# Patient Record
Sex: Female | Born: 1935 | Race: White | Hispanic: No
Health system: Southern US, Community
[De-identification: ages and names within clinical notes are randomized; demographics above are authoritative.]

## PROBLEM LIST (undated history)

## (undated) DIAGNOSIS — K219 Gastro-esophageal reflux disease without esophagitis: Secondary | ICD-10-CM

## (undated) DIAGNOSIS — R319 Hematuria, unspecified: Secondary | ICD-10-CM

## (undated) DIAGNOSIS — I447 Left bundle-branch block, unspecified: Secondary | ICD-10-CM

## (undated) DIAGNOSIS — M8580 Other specified disorders of bone density and structure, unspecified site: Secondary | ICD-10-CM

## (undated) DIAGNOSIS — E785 Hyperlipidemia, unspecified: Secondary | ICD-10-CM

## (undated) DIAGNOSIS — K429 Umbilical hernia without obstruction or gangrene: Secondary | ICD-10-CM

## (undated) DIAGNOSIS — I429 Cardiomyopathy, unspecified: Secondary | ICD-10-CM

## (undated) DIAGNOSIS — I5032 Chronic diastolic (congestive) heart failure: Secondary | ICD-10-CM

## (undated) DIAGNOSIS — I272 Pulmonary hypertension, unspecified: Secondary | ICD-10-CM

## (undated) DIAGNOSIS — M1990 Unspecified osteoarthritis, unspecified site: Secondary | ICD-10-CM

## (undated) DIAGNOSIS — I10 Essential (primary) hypertension: Secondary | ICD-10-CM

## (undated) DIAGNOSIS — I2510 Atherosclerotic heart disease of native coronary artery without angina pectoris: Secondary | ICD-10-CM

## (undated) DIAGNOSIS — Z950 Presence of cardiac pacemaker: Secondary | ICD-10-CM

## (undated) DIAGNOSIS — I340 Nonrheumatic mitral (valve) insufficiency: Secondary | ICD-10-CM

## (undated) DIAGNOSIS — C50919 Malignant neoplasm of unspecified site of unspecified female breast: Secondary | ICD-10-CM

## (undated) DIAGNOSIS — H3530 Unspecified macular degeneration: Secondary | ICD-10-CM

## (undated) DIAGNOSIS — E538 Deficiency of other specified B group vitamins: Secondary | ICD-10-CM

## (undated) HISTORY — DX: Other specified disorders of bone density and structure, unspecified site: M85.80

## (undated) HISTORY — PX: CATARACT EXTRACTION W/ INTRAOCULAR LENS  IMPLANT, BILATERAL: SHX1307

## (undated) HISTORY — PX: TONSILLECTOMY: SUR1361

## (undated) HISTORY — DX: Nonrheumatic mitral (valve) insufficiency: I34.0

## (undated) HISTORY — DX: Unspecified macular degeneration: H35.30

## (undated) HISTORY — DX: Atherosclerotic heart disease of native coronary artery without angina pectoris: I25.10

## (undated) HISTORY — DX: Pulmonary hypertension, unspecified: I27.20

## (undated) HISTORY — PX: HERNIA REPAIR: SHX51

## (undated) HISTORY — PX: JOINT REPLACEMENT: SHX530

## (undated) HISTORY — PX: CATARACT EXTRACTION: SUR2

## (undated) HISTORY — DX: Chronic diastolic (congestive) heart failure: I50.32

## (undated) HISTORY — DX: Unspecified osteoarthritis, unspecified site: M19.90

## (undated) HISTORY — DX: Hyperlipidemia, unspecified: E78.5

## (undated) HISTORY — DX: Deficiency of other specified B group vitamins: E53.8

## (undated) HISTORY — DX: Malignant neoplasm of unspecified site of unspecified female breast: C50.919

---

## 1993-08-04 HISTORY — PX: BREAST BIOPSY: SHX20

## 1993-08-04 HISTORY — PX: MASTECTOMY: SHX3

## 1998-08-15 ENCOUNTER — Other Ambulatory Visit: Admission: RE | Admit: 1998-08-15 | Discharge: 1998-08-15 | Payer: Self-pay | Admitting: *Deleted

## 1999-09-04 ENCOUNTER — Other Ambulatory Visit: Admission: RE | Admit: 1999-09-04 | Discharge: 1999-09-04 | Payer: Self-pay | Admitting: *Deleted

## 2000-09-09 ENCOUNTER — Other Ambulatory Visit: Admission: RE | Admit: 2000-09-09 | Discharge: 2000-09-09 | Payer: Self-pay | Admitting: *Deleted

## 2001-09-15 ENCOUNTER — Other Ambulatory Visit: Admission: RE | Admit: 2001-09-15 | Discharge: 2001-09-15 | Payer: Self-pay | Admitting: *Deleted

## 2002-09-19 ENCOUNTER — Encounter: Payer: Self-pay | Admitting: Family Medicine

## 2002-09-19 ENCOUNTER — Encounter: Admission: RE | Admit: 2002-09-19 | Discharge: 2002-09-19 | Payer: Self-pay | Admitting: Family Medicine

## 2002-11-15 ENCOUNTER — Other Ambulatory Visit: Admission: RE | Admit: 2002-11-15 | Discharge: 2002-11-15 | Payer: Self-pay | Admitting: *Deleted

## 2003-08-08 ENCOUNTER — Encounter: Admission: RE | Admit: 2003-08-08 | Discharge: 2003-08-08 | Payer: Self-pay | Admitting: Family Medicine

## 2003-08-08 ENCOUNTER — Encounter: Admission: RE | Admit: 2003-08-08 | Discharge: 2003-08-08 | Payer: Self-pay | Admitting: Sports Medicine

## 2003-08-11 ENCOUNTER — Encounter: Admission: RE | Admit: 2003-08-11 | Discharge: 2003-08-11 | Payer: Self-pay | Admitting: Sports Medicine

## 2003-08-25 ENCOUNTER — Encounter: Admission: RE | Admit: 2003-08-25 | Discharge: 2003-08-25 | Payer: Self-pay | Admitting: Family Medicine

## 2004-01-24 ENCOUNTER — Encounter: Admission: RE | Admit: 2004-01-24 | Discharge: 2004-01-24 | Payer: Self-pay | Admitting: *Deleted

## 2004-05-16 ENCOUNTER — Encounter: Admission: RE | Admit: 2004-05-16 | Discharge: 2004-05-16 | Payer: Self-pay | Admitting: Family Medicine

## 2004-07-01 ENCOUNTER — Encounter: Admission: RE | Admit: 2004-07-01 | Discharge: 2004-07-25 | Payer: Self-pay | Admitting: Family Medicine

## 2005-04-14 ENCOUNTER — Other Ambulatory Visit: Admission: RE | Admit: 2005-04-14 | Discharge: 2005-04-14 | Payer: Self-pay | Admitting: *Deleted

## 2005-07-23 ENCOUNTER — Ambulatory Visit (HOSPITAL_COMMUNITY): Admission: RE | Admit: 2005-07-23 | Discharge: 2005-07-23 | Payer: Self-pay | Admitting: Family Medicine

## 2005-07-23 ENCOUNTER — Encounter: Admission: RE | Admit: 2005-07-23 | Discharge: 2005-07-23 | Payer: Self-pay | Admitting: Family Medicine

## 2006-09-16 ENCOUNTER — Encounter: Admission: RE | Admit: 2006-09-16 | Discharge: 2006-09-16 | Payer: Self-pay | Admitting: Family Medicine

## 2007-08-24 ENCOUNTER — Encounter: Admission: RE | Admit: 2007-08-24 | Discharge: 2007-08-24 | Payer: Self-pay | Admitting: Family Medicine

## 2008-03-28 ENCOUNTER — Emergency Department (HOSPITAL_COMMUNITY): Admission: EM | Admit: 2008-03-28 | Discharge: 2008-03-28 | Payer: Self-pay | Admitting: Family Medicine

## 2008-08-04 HISTORY — PX: CORONARY ANGIOPLASTY WITH STENT PLACEMENT: SHX49

## 2008-10-03 ENCOUNTER — Encounter: Admission: RE | Admit: 2008-10-03 | Discharge: 2008-10-03 | Payer: Self-pay | Admitting: Family Medicine

## 2009-02-28 ENCOUNTER — Ambulatory Visit: Payer: Self-pay | Admitting: Sports Medicine

## 2009-02-28 DIAGNOSIS — M25569 Pain in unspecified knee: Secondary | ICD-10-CM | POA: Insufficient documentation

## 2009-02-28 DIAGNOSIS — M549 Dorsalgia, unspecified: Secondary | ICD-10-CM | POA: Insufficient documentation

## 2009-02-28 DIAGNOSIS — M216X9 Other acquired deformities of unspecified foot: Secondary | ICD-10-CM | POA: Insufficient documentation

## 2009-03-01 ENCOUNTER — Encounter: Payer: Self-pay | Admitting: Sports Medicine

## 2009-05-29 ENCOUNTER — Ambulatory Visit: Payer: Self-pay | Admitting: Sports Medicine

## 2009-08-04 HISTORY — PX: TOTAL HIP ARTHROPLASTY: SHX124

## 2009-08-08 ENCOUNTER — Encounter: Admission: RE | Admit: 2009-08-08 | Discharge: 2009-08-08 | Payer: Self-pay | Admitting: Family Medicine

## 2009-08-15 ENCOUNTER — Encounter: Admission: RE | Admit: 2009-08-15 | Discharge: 2009-08-15 | Payer: Self-pay | Admitting: Family Medicine

## 2009-12-04 ENCOUNTER — Encounter: Admission: RE | Admit: 2009-12-04 | Discharge: 2010-03-04 | Payer: Self-pay | Admitting: Ophthalmology

## 2010-01-03 ENCOUNTER — Inpatient Hospital Stay (HOSPITAL_BASED_OUTPATIENT_CLINIC_OR_DEPARTMENT_OTHER): Admission: RE | Admit: 2010-01-03 | Discharge: 2010-01-03 | Payer: Self-pay | Admitting: Cardiology

## 2010-01-03 ENCOUNTER — Inpatient Hospital Stay (HOSPITAL_COMMUNITY): Admission: EM | Admit: 2010-01-03 | Discharge: 2010-01-05 | Payer: Self-pay | Admitting: Cardiology

## 2010-01-17 ENCOUNTER — Encounter (HOSPITAL_COMMUNITY): Admission: RE | Admit: 2010-01-17 | Discharge: 2010-04-17 | Payer: Self-pay | Admitting: Cardiology

## 2010-04-02 ENCOUNTER — Encounter: Payer: Self-pay | Admitting: Sports Medicine

## 2010-04-18 ENCOUNTER — Encounter (HOSPITAL_COMMUNITY): Admission: RE | Admit: 2010-04-18 | Discharge: 2010-05-03 | Payer: Self-pay | Admitting: Cardiology

## 2010-04-18 ENCOUNTER — Encounter: Admission: RE | Admit: 2010-04-18 | Discharge: 2010-04-18 | Payer: Self-pay | Admitting: Sports Medicine

## 2010-04-18 ENCOUNTER — Ambulatory Visit: Payer: Self-pay | Admitting: Sports Medicine

## 2010-04-18 DIAGNOSIS — M1990 Unspecified osteoarthritis, unspecified site: Secondary | ICD-10-CM | POA: Insufficient documentation

## 2010-04-18 DIAGNOSIS — M25559 Pain in unspecified hip: Secondary | ICD-10-CM | POA: Insufficient documentation

## 2010-04-23 ENCOUNTER — Encounter: Payer: Self-pay | Admitting: Sports Medicine

## 2010-05-04 ENCOUNTER — Encounter (HOSPITAL_COMMUNITY)
Admission: RE | Admit: 2010-05-04 | Discharge: 2010-08-02 | Payer: Self-pay | Source: Home / Self Care | Attending: Cardiology | Admitting: Cardiology

## 2010-09-05 NOTE — Letter (Signed)
Summary: [facility]   Imported ByLevada Schilling 03/01/2009 09:04:45  _____________________________________________________________________  External Attachment:    Type:   Image     Comment:   External Document

## 2010-09-05 NOTE — Assessment & Plan Note (Signed)
Summary: R HIP PAIN AND R KNEE PAIN   Vital Signs:  Patient profile:   77 year old female BP sitting:   152 / 76  Vitals Entered By: Lillia Pauls CMA (May 29, 2009 2:52 PM)  History of Present Illness: CC: right knee and hip pain  [name] presents for f/u of right knee pain.  She has had knee pain in right knee for >26yr now, but states this was exacerbated in July after trip to beach where she had to go up and down stairs alot.  Also started noticing hip pain with these steps.  Seen at [facility] 02/2009 and found to have leg length discrepancy, placed in felt pad on right shoe, placed on donjoy brace to use daily and advised to f/u as needed.  Returns today with continued knee pain.  States using knee brace regularly.  Still with lateral knee pain but does note improvement with Motrin which she takes 600mg  in am, 400mg  in afternoon and 400mg  in evening.  No knee swelling/buckling/locking/catching. No prior knee procedures.  no h/o xrays.  also endorses 1wk hsitory (2 wks ago) of BLE feeling very weak and fatigued, resolved on its own. Also states last week she had sensation of imbalance but since fatigue resolved, this has improved as well.  Physical Exam  General:  Well-developed,well-nourished,in no acute distress; alert,appropriate and cooperative throughout examination Msk:  Hips: No ttp.  no SI joint tnederness, no greater trochanteric tenderness.    Knees: Genu Valgum (R>L). Generalized crepitus on the right. lateral jt line ttp on the right. No increased ligament laxity. (-) McMurray's. FROM with 5/5 strength. both knees show generalized synovial swelling but show good extension  Ankles/Feet: FROM of the ankles with 5/5 strength throughout. Significant bilateral MT arch collapse. Significant bilateral Hallux valgus. Bilateral Pes Planus.   right leg shortening evident from greater valgus at knee on right. Neurologic:  neurovascularly intact   Impression &  Recommendations:  Problem # 1:  KNEE PAIN, RIGHT, CHRONIC (ICD-719.46) continue DonJoy soft knee brace.  may continue to use motrin as needed.  continue felt inserts in her right shoes.  provided with 2 more felt inserts for other pairs of shoes at home.  Recommended 800 International Units vit d daily as well as 500mg  vit C for joint health.  If feels BLE weak again, to return for blood work (check K, Cr, consider checking CBC, vit D again, etc.  Problem # 2:  OTHER ACQUIRED DEFORMITY OF ANKLE AND FOOT OTHER (ICD-736.79) see #1.  Patient Instructions: 1)  Continue motrin as up to now. 2)  Continue knee brace use daily as able to. 3)  Start taking 800IU of vitamin D daily as well as 500mg  of Vit C.  4)  We have provided you with felt pads to place in your right shoes at home. 5)  Start practicing the balance exercises discussed today.  Be careful not to fall!

## 2010-09-05 NOTE — Medication Information (Signed)
Summary: [facility]   Imported By: Marily Memos 04/22/2010 10:18:37  _____________________________________________________________________  External Attachment:    Type:   Image     Comment:   External Document

## 2010-09-05 NOTE — Assessment & Plan Note (Signed)
Summary: R KNEE PAIN UP TO HIP   Vital Signs:  Patient profile:   77 year old female Height:      65 inches (165.10 cm) Weight:      185 pounds (84.09 kg) BMI:     30.90 BP sitting:   137 / 76  Vitals Entered By: Kathi Simpers [facility]) (April 18, 2010 11:11 AM)  History of Present Illness: Past 2 mos worse RT knee and hip pain has had knee pain for a long time now sxs are really limiting as they go to the hip as well  RT hip hurts back to buttocks w putting on RT shoe no groin pain  walking causes pain bike and step did not cause pain  easier to walk w walker  Allergies (verified): No Known Drug Allergies  Physical Exam  General:  Well-developed,well-nourished,in no acute distress; alert,appropriate and cooperative throughout examination Msk:  limited IR and ER of RT hip There is mild weakness in abduction and adduction also in flexion  left hip shows better IR and ER - about 45 to 50 deg total vs only 25 on RT  RT knee with chronic DJD changes prob mild effusion but not tight genu valgum is significant  standing shows funx short RT leg 2/2 valgus shift walks in pronation Additional Exam:  RT hip Xrays reveal sclerosis 100% loss of joint space spurring possible wedge of AVN  Rt Knee tricompartmental DJD spurring under patella lat joint space lost   Impression & Recommendations:  Problem # 1:  HIP PAIN, RIGHT (ICD-719.45)  Her updated medication list for this problem includes:    Tramadol Hcl 50 Mg Tabs (Tramadol hcl) .Marland Kitchen... 1 by mouth qid as needed pain   given RX for a cane with  severe DJD we need to help lessen pain as much as we can  Problem # 2:  KNEE PAIN, RIGHT, CHRONIC (ICD-719.46)  Her updated medication list for this problem includes:    Tramadol Hcl 50 Mg Tabs (Tramadol hcl) .Marland Kitchen... 1 by mouth qid as needed pain  use wedge in shoe use good support w knee sleeve  will keep up some of walking for cardiac rehab  will reck in 1  mo  Problem # 3:  DEGENERATIVE JOINT DISEASE (ICD-715.90)  Her updated medication list for this problem includes:    Tramadol Hcl 50 Mg Tabs (Tramadol hcl) .Marland Kitchen... 1 by mouth qid as needed pain  Orders: Radiology other (Radiology Other)   This is severe On RT hip replacement would be feasible this seems more severe at this point than knee  Problem # 4:  OTHER ACQUIRED DEFORMITY OF ANKLE AND FOOT OTHER (ICD-736.79) ankle/foot pronated will keep using some foot support and wedging  Complete Medication List: 1)  Tramadol Hcl 50 Mg Tabs (Tramadol hcl) .Marland Kitchen.. 1 by mouth qid as needed pain 2)  Fosamax 70 Mg Tabs (Alendronate sodium) .... Take 1 tablet by mouth once a week 3)  Plavix 75 Mg Tabs (Clopidogrel bisulfate) .... Take 1 tablet by mouth daily Prescriptions: TRAMADOL HCL 50 MG TABS (TRAMADOL HCL) 1 by mouth qid as needed pain  #100 x 3   Entered and Authorized by:   Enid Baas MD   Signed by:   Enid Baas MD on 04/18/2010   Method used:   Electronically to        [facility] [id number] (retail)       [address]       [place]  [place], [place]  [address]       Ph: [phone] or [phone]       Fax: [phone]   RxID:   [id number]

## 2010-09-05 NOTE — Letter (Signed)
Summary: *Referral Letter  [facility]  [address]   [place], [place] [address]   Phone: [phone]  Fax: [phone]    04/23/2010 Gean Birchwood, MD [facility] Fax [phone]   Dear Homero Fellers:   Thank you in advance for agreeing to see my patient:  Denae Zulueta [address] [place], [place]  [address]  Phone: [phone]  Reason for Referral: Severe DJD of RT hip  Procedures Requested: I think she needs THR but would like your opinion.  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  She is very functional but pain in hip is really limiting this.  Current Medical Problems: advanced DJD of RT hip and of knees bilaterally   Current Medications: 1)  TRAMADOL HCL 50 MG TABS (TRAMADOL HCL) 1 by mouth qid as needed pain 2)  FOSAMAX 70 MG TABS (ALENDRONATE SODIUM) Take 1 tablet by mouth once a week 3)  PLAVIX 75 MG TABS (CLOPIDOGREL BISULFATE) Take 1 tablet by mouth daily   Past Medical History: stent for coronary artery disease Dr. Jeanmarie Hubert follows her for primary care   Thank you again for agreeing to see our patient; please contact us if you have any further questions or need additional information.  Sincerely,  Vincent Gros MD

## 2010-09-11 ENCOUNTER — Encounter: Payer: Self-pay | Admitting: *Deleted

## 2010-09-24 ENCOUNTER — Encounter (HOSPITAL_COMMUNITY)
Admission: RE | Admit: 2010-09-24 | Discharge: 2010-09-24 | Disposition: A | Discharge status: Home / Self Care | Payer: Medicare Other | Source: Ambulatory Visit | Attending: Orthopedic Surgery | Admitting: Orthopedic Surgery

## 2010-09-24 ENCOUNTER — Other Ambulatory Visit (HOSPITAL_COMMUNITY): Payer: Self-pay | Admitting: Orthopedic Surgery

## 2010-09-24 DIAGNOSIS — Z01811 Encounter for preprocedural respiratory examination: Secondary | ICD-10-CM

## 2010-09-24 DIAGNOSIS — Z01818 Encounter for other preprocedural examination: Secondary | ICD-10-CM | POA: Insufficient documentation

## 2010-09-24 DIAGNOSIS — Z01812 Encounter for preprocedural laboratory examination: Secondary | ICD-10-CM | POA: Insufficient documentation

## 2010-09-24 LAB — BASIC METABOLIC PANEL
BUN: 16 mg/dL (ref 6–23)
CO2: 24 mEq/L (ref 19–32)
Calcium: 9.5 mg/dL (ref 8.4–10.5)
Chloride: 111 mEq/L (ref 96–112)
Creatinine, Ser: 0.81 mg/dL (ref 0.4–1.2)
GFR calc Af Amer: >60 mL/min (ref >60)
GFR calc non Af Amer: >60 mL/min (ref >60)
Glucose, Bld: 97 mg/dL (ref 70–99)
Potassium: 4.9 mEq/L (ref 3.5–5.1)
Sodium: 142 mEq/L (ref 135–145)

## 2010-09-24 LAB — CBC
HCT: 39.3 % (ref 36.0–46.0)
Hemoglobin: 12.4 g/dL (ref 12.0–15.0)
MCH: 30.1 pg (ref 26.0–34.0)
MCHC: 31.6 g/dL (ref 30.0–36.0)
MCV: 95.4 fL (ref 78.0–100.0)
Platelets: 285 10*3/uL (ref 150–400)
RBC: 4.12 MIL/uL (ref 3.87–5.11)
RDW: 14.7 % (ref 11.5–15.5)
WBC: 8.1 10*3/uL (ref 4.0–10.5)

## 2010-09-24 LAB — ABO/RH: ABO/RH(D): O NEG

## 2010-09-24 LAB — DIFFERENTIAL
Basophils Absolute: 0 10*3/uL (ref 0.0–0.1)
Basophils Relative: 0 % (ref 0–1)
Eosinophils Absolute: 0.1 10*3/uL (ref 0.0–0.7)
Eosinophils Relative: 2 % (ref 0–5)
Lymphocytes Relative: 25 % (ref 12–46)
Lymphs Abs: 2 10*3/uL (ref 0.7–4.0)
Monocytes Absolute: 0.5 10*3/uL (ref 0.1–1.0)
Monocytes Relative: 6 % (ref 3–12)
Neutro Abs: 5.4 10*3/uL (ref 1.7–7.7)
Neutrophils Relative %: 67 % (ref 43–77)

## 2010-09-24 LAB — TYPE AND SCREEN
ABO/RH(D): O NEG
Antibody Screen: NEGATIVE

## 2010-09-24 LAB — PROTIME-INR
INR: 0.95 (ref 0.00–1.49)
Prothrombin Time: 12.9 seconds (ref 11.6–15.2)

## 2010-09-24 LAB — SURGICAL PCR SCREEN
MRSA, PCR: NEGATIVE
Staphylococcus aureus: NEGATIVE

## 2010-09-24 LAB — APTT: aPTT: 25 seconds (ref 24–37)

## 2010-10-07 ENCOUNTER — Inpatient Hospital Stay (HOSPITAL_BASED_OUTPATIENT_CLINIC_OR_DEPARTMENT_OTHER)
Admission: RE | Admit: 2010-10-07 | Discharge: 2010-10-07 | Disposition: A | Discharge status: Home / Self Care | Payer: Medicare Other | Source: Ambulatory Visit | Attending: Cardiology | Admitting: Cardiology

## 2010-10-07 DIAGNOSIS — Y831 Surgical operation with implant of artificial internal device as the cause of abnormal reaction of the patient, or of later complication, without mention of misadventure at the time of the procedure: Secondary | ICD-10-CM | POA: Insufficient documentation

## 2010-10-07 DIAGNOSIS — I2510 Atherosclerotic heart disease of native coronary artery without angina pectoris: Secondary | ICD-10-CM | POA: Insufficient documentation

## 2010-10-07 DIAGNOSIS — I059 Rheumatic mitral valve disease, unspecified: Secondary | ICD-10-CM | POA: Insufficient documentation

## 2010-10-07 DIAGNOSIS — T82897A Other specified complication of cardiac prosthetic devices, implants and grafts, initial encounter: Secondary | ICD-10-CM | POA: Insufficient documentation

## 2010-10-10 ENCOUNTER — Observation Stay (HOSPITAL_COMMUNITY)
Admission: RE | Admit: 2010-10-10 | Discharge: 2010-10-11 | Disposition: A | Discharge status: Home / Self Care | Payer: Medicare Other | Source: Ambulatory Visit | Attending: Interventional Cardiology | Admitting: Interventional Cardiology

## 2010-10-10 DIAGNOSIS — E7800 Pure hypercholesterolemia, unspecified: Secondary | ICD-10-CM | POA: Insufficient documentation

## 2010-10-10 DIAGNOSIS — Y831 Surgical operation with implant of artificial internal device as the cause of abnormal reaction of the patient, or of later complication, without mention of misadventure at the time of the procedure: Secondary | ICD-10-CM | POA: Insufficient documentation

## 2010-10-10 DIAGNOSIS — Z853 Personal history of malignant neoplasm of breast: Secondary | ICD-10-CM | POA: Insufficient documentation

## 2010-10-10 DIAGNOSIS — I10 Essential (primary) hypertension: Secondary | ICD-10-CM | POA: Insufficient documentation

## 2010-10-10 DIAGNOSIS — T82897A Other specified complication of cardiac prosthetic devices, implants and grafts, initial encounter: Principal | ICD-10-CM | POA: Insufficient documentation

## 2010-10-10 DIAGNOSIS — H3530 Unspecified macular degeneration: Secondary | ICD-10-CM | POA: Insufficient documentation

## 2010-10-10 DIAGNOSIS — M129 Arthropathy, unspecified: Secondary | ICD-10-CM | POA: Insufficient documentation

## 2010-10-10 DIAGNOSIS — Z7902 Long term (current) use of antithrombotics/antiplatelets: Secondary | ICD-10-CM | POA: Insufficient documentation

## 2010-10-10 DIAGNOSIS — Z7982 Long term (current) use of aspirin: Secondary | ICD-10-CM | POA: Insufficient documentation

## 2010-10-10 DIAGNOSIS — I2510 Atherosclerotic heart disease of native coronary artery without angina pectoris: Secondary | ICD-10-CM | POA: Insufficient documentation

## 2010-10-10 DIAGNOSIS — I059 Rheumatic mitral valve disease, unspecified: Secondary | ICD-10-CM | POA: Insufficient documentation

## 2010-10-11 LAB — CBC
HCT: 33.3 % — ABNORMAL LOW (ref 36.0–46.0)
Hemoglobin: 10.7 g/dL — ABNORMAL LOW (ref 12.0–15.0)
MCH: 30.1 pg (ref 26.0–34.0)
MCHC: 32.1 g/dL (ref 30.0–36.0)
MCV: 93.8 fL (ref 78.0–100.0)
Platelets: 211 10*3/uL (ref 150–400)
RBC: 3.55 MIL/uL — ABNORMAL LOW (ref 3.87–5.11)
RDW: 14.4 % (ref 11.5–15.5)
WBC: 7.3 10*3/uL (ref 4.0–10.5)

## 2010-10-11 LAB — BASIC METABOLIC PANEL
BUN: 19 mg/dL (ref 6–23)
CO2: 27 mEq/L (ref 19–32)
Calcium: 8.6 mg/dL (ref 8.4–10.5)
Chloride: 108 mEq/L (ref 96–112)
Creatinine, Ser: 0.79 mg/dL (ref 0.4–1.2)
GFR calc Af Amer: >60 mL/min (ref >60)
GFR calc non Af Amer: >60 mL/min (ref >60)
Glucose, Bld: 96 mg/dL (ref 70–99)
Potassium: 4.2 mEq/L (ref 3.5–5.1)
Sodium: 141 mEq/L (ref 135–145)

## 2010-10-11 LAB — POCT ACTIVATED CLOTTING TIME: Activated Clotting Time: 387 seconds

## 2010-10-11 NOTE — Procedures (Signed)
[name], [name]                 ACCOUNT NO.:  [id number]  MEDICAL RECORD NO.:  [id number]          PATIENT TYPE:  LOCATION:                                 FACILITY:  PHYSICIAN:  Armanda Magic, M.D.     DATE OF BIRTH:  07-Oct-1922  DATE OF PROCEDURE:  10/07/2010 DATE OF DISCHARGE:                           CARDIAC CATHETERIZATION   REFERRING PHYSICIAN:  Bryan Lemma. Ehinger, MD  PROCEDURES:  Left heart catheterization, coronary angiography, left ventriculography.  OPERATOR:  Armanda Magic, MD  INDICATIONS:  Chest pain, abnormal nuclear stress test, abnormal 2-D echocardiogram with reduced LV function, and CAD.  COMPLICATIONS:  None.  IV ACCESS:  Via right femoral artery 4-French sheath.  IV MEDICATIONS:  Fentanyl 25 mcg.  This is an 77 year old female with history of CAD, status post drug- eluting stent to the LAD with residual diagonal disease in June 2011 also with known moderate mitral regurgitation and normal LV function by cath in June 2011 who presented with an episode of chest pain and the need for preoperative clearance for hip surgery.  A week prior to her presenting for preoperative clearance exam, she had an episode of sustained chest pain, classic for unstable angina.  She underwent nuclear stress test showing a mild perfusion defect in the basal inferior, mid inferior, apical inferior, basal anterior wall, which was felt most likely be due to attenuation artifact.  LV function on Cardiolite showed an EF of 46% and 2-D echocardiogram was done, which showed an EF of approximately 35%.  The patient now presents for cardiac catheterization.  The patient was brought to the cardiac catheterization laboratory in a fasting nonsedated state.  Informed consent was obtained.  The patient was connected to continuous heart rate and pulse oximetry monitoring and intermittent blood pressure monitoring.  The right groin was prepped and draped in sterile fashion.  Lidocaine  1% was used for local anesthesia. Using a modified Seldinger technique, a 4-French sheath was placed in right femoral artery.  Under fluoroscopic guidance, a 4-French JL-4 catheter was placed in the left coronary artery.  Multiple cine films were taken at 30-degree RAO and 40-degree LAO views.  This catheter was then exchanged out over a guidewire for a 4-French 3RCA catheter, which was placed under fluoroscopic guidance into the right coronary ostium. Multiple cine films were taken at 30-degree RAO and 40-degree LAO views. This catheter was then exchanged out over guidewire for a 4-French angled pigtail catheter, which was placed under fluoroscopic guidance in the left ventricular cavity.  Left ventriculography was performed in a 30-degree RAO view using a total of 25 mL of contrast at 12 mL per second.  The catheter was then pulled back across the aortic valve with no significant gradient noted.  At the end of procedure, all catheters and sheaths were removed.  Manual compression was performed until adequate hemostasis was obtained.  The patient was transferred back to room in stable condition.  Of note, the patient did have an episode of chest pain during coronary artery injection and was given one sublingual nitroglycerin.  RESULTS:  Left main coronary artery is widely  patent and bifurcates the left anterior descending artery and left circumflex artery.  Left anterior descending artery has a stent in the proximal portion with 50-60% in-stent restenosis.  The ongoing LAD gives rise to a moderate- sized diagonal in the midportion of the LAD.  This diagonal has an 80% stenosis in the proximal and midportion before bifurcating into daughter vessels, both of which are patent but small, just at the takeoff of this diagonal, the LAD has an 80% narrowing and the rest of the vessel tapers down to a very small luminal caliber towards the apex.  The left circumflex is widely patent throughout  its course in the AV groove.  It gives rise to a first obtuse marginal branch, which is moderate in size and is widely patent and bifurcates in two daughter vessels.  It then gives rise to a second smaller obtuse marginal branch which is patent.  The right coronary artery has a calcified plaque in the proximal portion with 30% eccentric lesion.  The rest of the RCA is widely patent throughout its course distally and bifurcates into posterior descending artery and posterior lateral artery, both of which are widely patent.  Left ventriculography shows mild-to-moderate LV dysfunction, EF 35-40%, LV pressure 152/60 mmHg, aortic pressure 152/62 mmHg, LVEDP 20 mmHg.  ASSESSMENT: 1. A 50-60% in-stent restenosis of proximal left anterior descending. 2. An 80% mid left anterior descending stenosis. 3. An 80% diagonal 1 stenosis. 4. Nonobstructive disease of the right coronary artery. 5. Mild-to-moderate left ventricular dysfunction.  PLAN:  We will discharge home after IV fluid and bedrest complete.  We will start Altace 2.5 mg daily, Coreg 3.125 mg b.i.d., discontinue Bumex and start Lasix 20 mg daily.  She will continue on her aspirin, Plavix, and Imdur.  I am going to review the films with Dr. Verdis Prime to see if PCI of the mid LAD is a possibility.  She will follow up with me in 2 weeks.     Armanda Magic, M.D.     TT/MEDQ  D:  10/07/2010  T:  10/08/2010  Job:  [id number]  cc:   Bryan Lemma. Manus Gunning, M.D.  Electronically Signed by Armanda Magic M.D. on 10/10/2010 04:20:03 PM

## 2010-10-16 ENCOUNTER — Encounter: Payer: Self-pay | Admitting: *Deleted

## 2010-10-21 LAB — CBC
HCT: 29.3 % — ABNORMAL LOW (ref 36.0–46.0)
HCT: 31.3 % — ABNORMAL LOW (ref 36.0–46.0)
HCT: 33 % — ABNORMAL LOW (ref 36.0–46.0)
Hemoglobin: 10 g/dL — ABNORMAL LOW (ref 12.0–15.0)
Hemoglobin: 10.6 g/dL — ABNORMAL LOW (ref 12.0–15.0)
Hemoglobin: 11 g/dL — ABNORMAL LOW (ref 12.0–15.0)
MCHC: 33.5 g/dL (ref 30.0–36.0)
MCHC: 34 g/dL (ref 30.0–36.0)
MCHC: 34.2 g/dL (ref 30.0–36.0)
MCV: 94.9 fL (ref 78.0–100.0)
MCV: 95.1 fL (ref 78.0–100.0)
MCV: 95.1 fL (ref 78.0–100.0)
Platelets: 209 10*3/uL (ref 150–400)
Platelets: 218 10*3/uL (ref 150–400)
Platelets: 229 10*3/uL (ref 150–400)
RBC: 3.08 MIL/uL — ABNORMAL LOW (ref 3.87–5.11)
RBC: 3.29 MIL/uL — ABNORMAL LOW (ref 3.87–5.11)
RBC: 3.47 MIL/uL — ABNORMAL LOW (ref 3.87–5.11)
RDW: 13.4 % (ref 11.5–15.5)
RDW: 13.6 % (ref 11.5–15.5)
RDW: 14 % (ref 11.5–15.5)
WBC: 5.9 10*3/uL (ref 4.0–10.5)
WBC: 6.4 10*3/uL (ref 4.0–10.5)
WBC: 7.2 10*3/uL (ref 4.0–10.5)

## 2010-10-21 LAB — POCT I-STAT 3, ART BLOOD GAS (G3+)
Acid-Base Excess: 1 mmol/L (ref 0.0–2.0)
Acid-base deficit: 1 mmol/L (ref 0.0–2.0)
Bicarbonate: 24.7 mEq/L — ABNORMAL HIGH (ref 20.0–24.0)
Bicarbonate: 26.1 mEq/L — ABNORMAL HIGH (ref 20.0–24.0)
O2 Saturation: 68 %
O2 Saturation: 95 %
TCO2: 26 mmol/L (ref 0–100)
TCO2: 27 mmol/L (ref 0–100)
pCO2 arterial: 40.5 mmHg (ref 35.0–45.0)
pCO2 arterial: 42.1 mmHg (ref 35.0–45.0)
pH, Arterial: 7.376 (ref 7.350–7.400)
pH, Arterial: 7.418 — ABNORMAL HIGH (ref 7.350–7.400)
pO2, Arterial: 36 mmHg — CL (ref 80.0–100.0)
pO2, Arterial: 72 mmHg — ABNORMAL LOW (ref 80.0–100.0)

## 2010-10-21 LAB — COMPREHENSIVE METABOLIC PANEL
ALT: 16 U/L (ref 0–35)
AST: 18 U/L (ref 0–37)
Albumin: 3.3 g/dL — ABNORMAL LOW (ref 3.5–5.2)
Alkaline Phosphatase: 84 U/L (ref 39–117)
BUN: 19 mg/dL (ref 6–23)
CO2: 30 mEq/L (ref 19–32)
Calcium: 9.1 mg/dL (ref 8.4–10.5)
Chloride: 105 mEq/L (ref 96–112)
Creatinine, Ser: 0.86 mg/dL (ref 0.4–1.2)
GFR calc Af Amer: >60 mL/min (ref >60)
GFR calc non Af Amer: >60 mL/min (ref >60)
Glucose, Bld: 137 mg/dL — ABNORMAL HIGH (ref 70–99)
Potassium: 3.1 mEq/L — ABNORMAL LOW (ref 3.5–5.1)
Sodium: 139 mEq/L (ref 135–145)
Total Bilirubin: 0.6 mg/dL (ref 0.3–1.2)
Total Protein: 5.5 g/dL — ABNORMAL LOW (ref 6.0–8.3)

## 2010-10-21 LAB — BASIC METABOLIC PANEL
BUN: 15 mg/dL (ref 6–23)
BUN: 23 mg/dL (ref 6–23)
CO2: 26 mEq/L (ref 19–32)
CO2: 30 mEq/L (ref 19–32)
Calcium: 8.2 mg/dL — ABNORMAL LOW (ref 8.4–10.5)
Calcium: 8.6 mg/dL (ref 8.4–10.5)
Chloride: 107 mEq/L (ref 96–112)
Chloride: 107 mEq/L (ref 96–112)
Creatinine, Ser: 0.79 mg/dL (ref 0.4–1.2)
Creatinine, Ser: 0.99 mg/dL (ref 0.4–1.2)
GFR calc Af Amer: >60 mL/min (ref >60)
GFR calc Af Amer: >60 mL/min (ref >60)
GFR calc non Af Amer: 53 mL/min — ABNORMAL LOW (ref >60)
GFR calc non Af Amer: >60 mL/min (ref >60)
Glucose, Bld: 103 mg/dL — ABNORMAL HIGH (ref 70–99)
Glucose, Bld: 94 mg/dL (ref 70–99)
Potassium: 3.8 mEq/L (ref 3.5–5.1)
Potassium: 3.9 mEq/L (ref 3.5–5.1)
Sodium: 137 mEq/L (ref 135–145)
Sodium: 141 mEq/L (ref 135–145)

## 2010-10-21 LAB — LIPID PANEL
Cholesterol: 148 mg/dL (ref 0–200)
HDL: 52 mg/dL (ref >39)
LDL Cholesterol: 84 mg/dL (ref 0–99)
Total CHOL/HDL Ratio: 2.8 RATIO
Triglycerides: 60 mg/dL (ref <150)
VLDL: 12 mg/dL (ref 0–40)

## 2010-10-21 LAB — POCT I-STAT 3, VENOUS BLOOD GAS (G3P V)
Acid-Base Excess: 2 mmol/L (ref 0.0–2.0)
Bicarbonate: 27.6 mEq/L — ABNORMAL HIGH (ref 20.0–24.0)
O2 Saturation: 66 %
TCO2: 29 mmol/L (ref 0–100)
pCO2, Ven: 44.9 mmHg — ABNORMAL LOW (ref 45.0–50.0)
pH, Ven: 7.397 — ABNORMAL HIGH (ref 7.250–7.300)
pO2, Ven: 35 mmHg (ref 30.0–45.0)

## 2010-10-21 LAB — CARDIAC PANEL(CRET KIN+CKTOT+MB+TROPI)
CK, MB: 1 ng/mL (ref 0.3–4.0)
CK, MB: 1.1 ng/mL (ref 0.3–4.0)
CK, MB: 1.3 ng/mL (ref 0.3–4.0)
Relative Index: INVALID (ref 0.0–2.5)
Relative Index: INVALID (ref 0.0–2.5)
Relative Index: INVALID (ref 0.0–2.5)
Total CK: 31 U/L (ref 7–177)
Total CK: 33 U/L (ref 7–177)
Total CK: 40 U/L (ref 7–177)
Troponin I: 0.01 ng/mL (ref 0.00–0.06)
Troponin I: 0.01 ng/mL (ref 0.00–0.06)
Troponin I: 0.02 ng/mL (ref 0.00–0.06)

## 2010-10-21 LAB — PROTIME-INR
INR: 0.98 (ref 0.00–1.49)
Prothrombin Time: 12.9 seconds (ref 11.6–15.2)

## 2010-10-21 LAB — MRSA PCR SCREENING: MRSA by PCR: NEGATIVE

## 2010-10-21 LAB — HEPARIN LEVEL (UNFRACTIONATED): Heparin Unfractionated: 0.43 IU/mL (ref 0.30–0.70)

## 2010-10-21 LAB — TSH: TSH: 2.04 u[IU]/mL (ref 0.350–4.500)

## 2010-10-21 LAB — APTT: aPTT: 26 seconds (ref 24–37)

## 2010-11-11 ENCOUNTER — Encounter (HOSPITAL_COMMUNITY)
Admission: RE | Admit: 2010-11-11 | Discharge: 2010-11-11 | Disposition: A | Discharge status: Home / Self Care | Payer: Medicare Other | Source: Ambulatory Visit | Attending: Orthopedic Surgery | Admitting: Orthopedic Surgery

## 2010-11-11 LAB — CBC
HCT: 32.2 % — ABNORMAL LOW (ref 36.0–46.0)
Hemoglobin: 10.4 g/dL — ABNORMAL LOW (ref 12.0–15.0)
MCH: 30 pg (ref 26.0–34.0)
MCHC: 32.3 g/dL (ref 30.0–36.0)
MCV: 92.8 fL (ref 78.0–100.0)
Platelets: 234 10*3/uL (ref 150–400)
RBC: 3.47 MIL/uL — ABNORMAL LOW (ref 3.87–5.11)
RDW: 14.5 % (ref 11.5–15.5)
WBC: 8.8 10*3/uL (ref 4.0–10.5)

## 2010-11-11 LAB — PROTIME-INR
INR: 1.01 (ref 0.00–1.49)
Prothrombin Time: 13.5 seconds (ref 11.6–15.2)

## 2010-11-11 LAB — BASIC METABOLIC PANEL
BUN: 22 mg/dL (ref 6–23)
CO2: 24 mEq/L (ref 19–32)
Calcium: 9.1 mg/dL (ref 8.4–10.5)
Chloride: 111 mEq/L (ref 96–112)
Creatinine, Ser: 0.88 mg/dL (ref 0.4–1.2)
GFR calc Af Amer: >60 mL/min (ref >60)
GFR calc non Af Amer: >60 mL/min (ref >60)
Glucose, Bld: 94 mg/dL (ref 70–99)
Potassium: 5.2 mEq/L — ABNORMAL HIGH (ref 3.5–5.1)
Sodium: 139 mEq/L (ref 135–145)

## 2010-11-11 LAB — SURGICAL PCR SCREEN
MRSA, PCR: NEGATIVE
Staphylococcus aureus: POSITIVE — AB

## 2010-11-11 LAB — APTT: aPTT: 28 seconds (ref 24–37)

## 2010-11-18 ENCOUNTER — Inpatient Hospital Stay (HOSPITAL_COMMUNITY)
Admission: RE | Admit: 2010-11-18 | Discharge: 2010-11-21 | DRG: 470 | Disposition: A | Discharge status: Skilled Nursing Facility | Payer: Medicare Other | Source: Ambulatory Visit | Attending: Orthopedic Surgery | Admitting: Orthopedic Surgery

## 2010-11-18 ENCOUNTER — Ambulatory Visit (HOSPITAL_COMMUNITY): Payer: Medicare Other

## 2010-11-18 DIAGNOSIS — I2510 Atherosclerotic heart disease of native coronary artery without angina pectoris: Secondary | ICD-10-CM | POA: Diagnosis present

## 2010-11-18 DIAGNOSIS — M1610 Unilateral primary osteoarthritis, unspecified hip: Principal | ICD-10-CM | POA: Diagnosis present

## 2010-11-18 DIAGNOSIS — D62 Acute posthemorrhagic anemia: Secondary | ICD-10-CM | POA: Diagnosis not present

## 2010-11-18 DIAGNOSIS — Z01812 Encounter for preprocedural laboratory examination: Secondary | ICD-10-CM

## 2010-11-18 DIAGNOSIS — M169 Osteoarthritis of hip, unspecified: Principal | ICD-10-CM | POA: Diagnosis present

## 2010-11-18 DIAGNOSIS — I10 Essential (primary) hypertension: Secondary | ICD-10-CM | POA: Diagnosis present

## 2010-11-18 DIAGNOSIS — Z853 Personal history of malignant neoplasm of breast: Secondary | ICD-10-CM

## 2010-11-19 LAB — CBC
HCT: 23.2 % — ABNORMAL LOW (ref 36.0–46.0)
Hemoglobin: 7.6 g/dL — ABNORMAL LOW (ref 12.0–15.0)
MCH: 30.2 pg (ref 26.0–34.0)
MCHC: 32.8 g/dL (ref 30.0–36.0)
MCV: 92.1 fL (ref 78.0–100.0)
Platelets: 205 10*3/uL (ref 150–400)
RBC: 2.52 MIL/uL — ABNORMAL LOW (ref 3.87–5.11)
RDW: 14.6 % (ref 11.5–15.5)
WBC: 9.5 10*3/uL (ref 4.0–10.5)

## 2010-11-19 LAB — BASIC METABOLIC PANEL
BUN: 13 mg/dL (ref 6–23)
CO2: 28 mEq/L (ref 19–32)
Calcium: 8.3 mg/dL — ABNORMAL LOW (ref 8.4–10.5)
Chloride: 106 mEq/L (ref 96–112)
Creatinine, Ser: 0.78 mg/dL (ref 0.4–1.2)
GFR calc Af Amer: >60 mL/min (ref >60)
GFR calc non Af Amer: >60 mL/min (ref >60)
Glucose, Bld: 121 mg/dL — ABNORMAL HIGH (ref 70–99)
Potassium: 4 mEq/L (ref 3.5–5.1)
Sodium: 137 mEq/L (ref 135–145)

## 2010-11-19 LAB — PROTIME-INR
INR: 1.13 (ref 0.00–1.49)
Prothrombin Time: 14.7 seconds (ref 11.6–15.2)

## 2010-11-19 LAB — PREPARE RBC (CROSSMATCH)

## 2010-11-19 NOTE — Op Note (Signed)
NAME:  [name], [name]                 ACCOUNT NO.:  [id number]  MEDICAL RECORD NO.:  [id number]           PATIENT TYPE:  I  LOCATION:  5034                         FACILITY:  [facility]  PHYSICIAN:  Feliberto Gottron. Turner Daniels, M.D.   DATE OF BIRTH:  07-30-1923  DATE OF PROCEDURE:  11/18/2010 DATE OF DISCHARGE:                              OPERATIVE REPORT   PREOPERATIVE DIAGNOSIS:  End-stage arthritis, right hip.  POSTOPERATIVE DIAGNOSIS:  End-stage arthritis, right hip.  PROCEDURE:  Right total hip arthroplasty using a DePuy system 52-mm pinnacle cup, no holes; 10 degrees polyethylene liner index posterior and superior except and a +5 36-mm metal head; on the femoral side, a cemented #4 Summit basic stem with standard offset, 11-mm tip and a #3 cement occluder.  SURGEON:  Feliberto Gottron.  Turner Daniels, MD  FIRST ASSISTANT:  Shirl Harris, PA-C  ANESTHESIA:  Endotracheal.  ESTIMATED BLOOD LOSS:  400 mL.  FLUID REPLACEMENT:  1500 mL of crystalloid.  DRAINS PLACED:  Foley catheter.  URINE OUTPUT:  300 mL.  INDICATIONS FOR PROCEDURE:  An 77 year old woman with end-stage arthritis of right hip, has desired hip replacement for the last 6 months.  Unfortunately, she had a stent placed last year, and we had to wait until she obtain cardiac clearance.  In the meantime, she is failed all conservative measures with anti-inflammatory medicines, limited of course by the fact that she is on Plavix, use of a cane or a walker, and judicious use of narcotics.  X-ray showed bone-on-bone arthritic changes, and she is ready for surgical stabilization and intervention having been cleared by her cardiologist.  DESCRIPTION OF PROCEDURE:  The patient identified by armband and received preoperative IV antibiotics in the holding area at [facility], taken to the operating room 5, anesthetic monitors were attached, endotracheal anesthesia induced with the patient in the supine position.  A Foley catheter was  inserted.  She was then rolled into the left lateral decubitus position, fixed there with Trecia Rogers II pelvic clamp.  Right lower extremity prepped and draped in sterile fashion from the ankle to the hemipelvis.  Skin along the lateral hip and thigh infiltrated with 20 mL of 0.5% Marcaine and epinephrine solution after time-out procedure performed.  We began the operation itself by making a standard posterolateral approach to the hip 18 cm in length through the skin and subcutaneous tissue down to the level the IT band, cut along with skin incision, small bleeders identified and cauterized with electrocautery.  Cobra retractors were placed between the gluteus minimus and superior hip joint capsule and quadratus femoris and the inferior hip joint capsule.  Short external rotators and piriformis were tagged with a #2 Ethibond suture, cut off their insertion on the intertrochanteric crest exposing the posterior aspect of the hip joint capsule, which was developed into an acetabular based flap going from posterior-superior off the acetabulum out over the femoral neck and exiting posterior inferior on the acetabular rim.  This flap was tagged with two #2 Ethibond sutures.  We then flexed, internally rotated the hip dislocating the arthritic femoral head, standard neck cut performed  one fingerbreadth above the lesser trochanter, proximal femur translocated anteriorly using a Hohmann retractor levering off the anterior column.  Posterior inferior wing retractor was then placed exposing the acetabulum.  We then performed a labrectomy using electrocautery and then sequentially reamed up to a 51- mm basket reamer obtaining good coverage in all quadrants.  We then selected a 52-mm DePuy pinnacle shell with no holes and hammered this into place and placed a trial of 10-degree liner index posterior and superior to accept a 36 mm head.  Hip itself was then flexed, internally rotated exposing the  proximal femur, which was entered with the initiating reamer followed by the lateral reamer and axial reaming up to 4-5 axial tapered reamer followed by broaching up to a #4 broach and milling the calcar, so that we had a flush fit.  Standard neck with a +0 and +5 36-mm heads was then trialed.  The +5 had the best fit and fill, stability to 90 flexion, 70 internal rotation noted,  and extension external rotation if could that be dislocated.  At this point, the trial components were removed.  We sized for #3 cement restrictor, placed a restrictor, and then pulse lavaged the femur, and dried with suction and sponges, a double batch of DePuy 1 cement with 1500 mg Zinacef was mixed at the back table and injected into the femoral canal at the 2-1/2- minute mark and pressurized.  This was followed by a #4 Summit basic stem with a 12-mm tip.  This was held in place and 20 degrees of anteversion as the cement cured and once it had cured, a +5 36-mm metal head was hammered onto the stem, the hip reduced, and taken through range of motion, stability was noted to be excellent.  The wound irrigated out one more time with pulse lavage, normal saline solution, minimal bleeding was noted at this point.  Capsular flap and short external rotators were repaired back to intertrochanteric crest through drill holes, a #2 Ethibond suture.  The IT band closed with running #1 Vicryl suture, subcutaneous tissue 0 and 2-0 undyed Vicryl suture, and the skin with skin staples.  Dressing of Xeroform and Mepilex was applied.  The patient was unclamped, rolled supine, awakened, and taken to the recovery room without difficulty.     Feliberto Gottron. Turner Daniels, M.D.     Ovid Curd  D:  11/18/2010  T:  11/18/2010  Job:  [id number]  Electronically Signed by Gean Birchwood M.D. on 11/19/2010 07:15:07 PM

## 2010-11-20 LAB — TYPE AND SCREEN
ABO/RH(D): O NEG
Antibody Screen: NEGATIVE
Unit division: 0
Unit division: 0

## 2010-11-20 LAB — CBC
HCT: 37.2 % (ref 36.0–46.0)
Hemoglobin: 12.1 g/dL (ref 12.0–15.0)
MCH: 28.7 pg (ref 26.0–34.0)
MCHC: 32.5 g/dL (ref 30.0–36.0)
MCV: 88.2 fL (ref 78.0–100.0)
Platelets: 284 10*3/uL (ref 150–400)
RBC: 4.22 MIL/uL (ref 3.87–5.11)
RDW: 12.8 % (ref 11.5–15.5)
WBC: 8.4 10*3/uL (ref 4.0–10.5)

## 2010-11-20 LAB — PROTIME-INR
INR: 0.93 (ref 0.00–1.49)
Prothrombin Time: 12.7 seconds (ref 11.6–15.2)

## 2010-11-21 LAB — CBC
HCT: 26.6 % — ABNORMAL LOW (ref 36.0–46.0)
Hemoglobin: 8.8 g/dL — ABNORMAL LOW (ref 12.0–15.0)
MCH: 29.7 pg (ref 26.0–34.0)
MCHC: 33.1 g/dL (ref 30.0–36.0)
MCV: 89.9 fL (ref 78.0–100.0)
Platelets: 181 10*3/uL (ref 150–400)
RBC: 2.96 MIL/uL — ABNORMAL LOW (ref 3.87–5.11)
RDW: 15.8 % — ABNORMAL HIGH (ref 11.5–15.5)
WBC: 7.8 10*3/uL (ref 4.0–10.5)

## 2010-11-21 LAB — PROTIME-INR
INR: 1.4 (ref 0.00–1.49)
Prothrombin Time: 17.4 seconds — ABNORMAL HIGH (ref 11.6–15.2)

## 2010-11-22 NOTE — Discharge Summary (Signed)
NAMECHARMIN, [name] NO.:  [id number]  MEDICAL RECORD NO.:  [id number]           PATIENT TYPE:  I  LOCATION:  6527                         FACILITY:  [facility]  PHYSICIAN:  Lyn Records, M.D.   DATE OF BIRTH:  June 05, 1923  DATE OF ADMISSION:  10/10/2010 DATE OF DISCHARGE:  10/11/2010                              DISCHARGE SUMMARY   REASON FOR ADMISSION:  PCI.  DISCHARGE DIAGNOSES: 1. Coronary atherosclerotic heart disease.     a.     Restenosis proximal LAD drug-eluting stent.     b.     Cutting balloon angioplasty on October 10, 2010. 2. Osteoarthritis with upcoming right hip surgery. 3. Macular degeneration. 4. History of breast cancer. 5. Moderate mitral regurgitation.  PROCEDURE PERFORMED:  Left heart catheterization, intravascular ultrasound, cutting balloon angioplasty of LAD in-stent restenosis on October 10, 2010.  DISCHARGE/PLAN: 1. Follow up with Dr. Mayford Knife in 10 days. 2. Continue medications as previously prescribed including aspirin 321     mg per day, Carvedilol 3.125 mg twice per day, ramipril 2.5 mg per     day, Fosamax weekly, furosemide 20 mg daily, Imdur 60 mg daily,     meloxicam 7.5 mg 1-2 tablets daily with meals as needed for     arthritis pain, nitroglycerin 0.4 mg sublingually p.r.n. chest     pain, PreserVision twice daily, simvastatin 10 mg per day, tramadol     50 mg per day, vitamin C, and vitamin D.  Activity:  As tolerated.     She is cautioned to use nitroglycerin if recurrent chest pain and     to report this to Dr. Mayford Knife.  Cleared for hip surgery after 3     weeks' post cutting balloon angioplasty.  HISTORY AND PHYSICAL AND HOSPITAL COURSE:  Please see the office records at present on the patient's chart for the preceding history.  Briefly, the patient had a drug-eluting stent placed in a tight proximal LAD culprit lesion in June 2011.  One month ago, she had a prolonged episode of chest pain.  At the time of the prior  angiogram, the patient had moderate to severe mid to distal LAD disease     had a bifurcation that was treated.  A nuclear study done by Dr. Mayford Knife prior to anticipated hip surgery demonstrated abnormalities and possible reduction in LV function.  She was restarted by Dr. Mayford Knife to assess stent patency.  In- stent restenosis was identified.  The lesion was borderline.  She was brought in for fractional flow reserve assessment and possible PCI.  On the day of the procedure, the equipment in the room that allows weight wire use and fractional flow reserve calculation was nonfunctional.  We therefore used intravascular ultrasound.  The cross sectional area of the tightest region of the stented vessel was less than 3.25 mm2.  There was also obvious evidence of mid stent underexpansion.  The distal normal vessel reference diameter was 3.8 x 3.5.  With these findings, we proceeded with treatment of in-stent restenosis.  A 3.5 mm cutting balloon, 15 mm long was used.  Total  balloon time 2 minutes 15 seconds.  A nice angiographic result was obtained.  No complications occurred.  Overnight, the patient has had no complications.  The right radial area is unremarkable.  Hemoglobin is 10.7.  The creatinine is 0.79.     Lyn Records, M.D.     HWS/MEDQ  D:  10/11/2010  T:  10/12/2010  Job:  [id number]  cc:   Bryan Lemma. Manus Gunning, M.D. Armanda Magic, M.D.  Electronically Signed by Verdis Prime M.D. on 11/22/2010 04:51:52 PM

## 2010-11-22 NOTE — Discharge Summary (Signed)
NAME:  [name], [name]                 ACCOUNT NO.:  [id number]  MEDICAL RECORD NO.:  [id number]           PATIENT TYPE:  I  LOCATION:  5034                         FACILITY:  [facility]  PHYSICIAN:  Feliberto Gottron. Turner Daniels, M.D.   DATE OF BIRTH:  Sep 09, 1922  DATE OF ADMISSION:  11/18/2010 DATE OF DISCHARGE:  11/21/2010                              DISCHARGE SUMMARY   CHIEF COMPLAINT:  Right hip pain.  HISTORY OF PRESENT ILLNESS:  This is an 77 year old lady who complains of severe unremitting pain in her right hip despite extensive conservative treatment and ambulation with a cane.  She has obtained cardiac clearance and desires a surgical intervention.  PAST MEDICAL HISTORY:  Significant for: 1. Coronary artery disease. 2. Hypertension 3. Breast cancer.  PAST SURGICAL HISTORY:  Significant for: 1. Cardiac stent. 2. Bilateral mastectomy.  She has no known drug allergies.  SOCIAL HISTORY:  She denies use of tobacco and drinks occasional alcohol.  FAMILY HISTORY:  Noncontributory.  OBJECTIVE:  Gross examination of the right hip demonstrates significant pain with attempted internal rotation.  She has a negative foot tap and is neurovascularly intact.  X-rays of the right hip demonstrates bone-on-bone degenerative joint disease.  PREOP LABORATORY DATA:  White blood cells 8.8, red blood cells 3.47, hemoglobin 10.4, hematocrit 32.2, platelets 234.  PT 13.5, INR 1.01, PTT 28.  Sodium 139, potassium 5.2, chloride 111, glucose 94, BUN 22, creatinine 0.88.  Urinalysis was within normal limits.  HOSPITAL COURSE:  Ms. Sheppard Penton was admitted to [facility] on November 18, 2010, when she underwent right total hip arthroplasty.  The procedure was performed by Dr. Gean Birchwood, and the patient tolerated it well.  A perioperative Foley catheter was placed and she was transferred to the floor on Lovenox and Coumadin for DVT prophylaxis.  On the first postoperative day, she was awake but complained of  fatigue, hemoglobin was 7.6 and she was transfused with 2 units of packed red blood cells. Foley catheter was discontinued after physical therapy.  On the second postoperative day, she reported significant improvement in the fatigue and denied any shortness of breath.  She was eating well and reporting good pain control.  On postoperative day 3, her hemoglobin was 8.8.  She was voiding well and slowly progressing with physical therapy, so she was discharged to a skilled nursing facility for a short stay for rehab.  DISPOSITION:  The patient was discharged on November 21, 2010.  She is weightbearing as tolerated.  DISCHARGE MEDICINES:  As per the HMR with the addition of Percocet as needed for pain and Coumadin with a target INR of 1.5-2.0.  The skilled nursing will manage her physical therapy, Coumadin, and wound care.  She would return and followup on postoperative day 14 for x-rays and suture removal.  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  Shirl Harris, PA   ______________________________ Feliberto Gottron. Turner Daniels, M.D.    JW/MEDQ  D:  11/21/2010  T:  11/21/2010  Job:  [id number]  Electronically Signed by Shirl Harris PA  on 11/21/2010 05:22:12 PM Electronically Signed by Gean Birchwood M.D. on 11/22/2010 09:54:02 AM

## 2010-11-22 NOTE — Cardiovascular Report (Signed)
NAME:  [name], [name] NO.:  [id number]  MEDICAL RECORD NO.:  [id number]           PATIENT TYPE:  I  LOCATION:  6527                         FACILITY:  [facility]  PHYSICIAN:  Lyn Records, M.D.   DATE OF BIRTH:  Mar 01, 1923  DATE OF PROCEDURE:  10/10/2010 DATE OF DISCHARGE:                           CARDIAC CATHETERIZATION   INDICATIONS FOR PROCEDURE:  Recent chest pain.  The patient subsequently had a nuclear study by Dr. Mayford Knife, which was abnormal.  Episode of chest discomfort lasted about 30 minutes.  Diagnostic cath demonstrated intermediate stenosis in the LAD stent.  She was brought back today for fractional flow reserve and treatment of restenosis of hemodynamically significant.  PROCEDURE PERFORMED: 1. Attempted fractional flow reserve, unable to complete due to     equipment malfunction 2. Intravascular ultrasound. 3. Cutting balloon angioplasty of LAD in-stent restenosis.  DESCRIPTION:  After discussing the treatment options with the patient and family, she was brought to the Cath Lab in the postabsorptive state. She is scheduled to have hip surgery.  She is unable to ambulate because of severe hip pain.  She does not want to delay surgery for the length of time that would be necessary for her to have safe discontinuation of Plavix, if a drug-eluting stent is deployed.  We talked about the treatment option including FFR determination and if not hemodynamically significant, no PCI.  The other option is to treat the lesion with cutting balloon angioplasty since it appears relatively focal.  This would allow Plavix to be discontinued relatively easily within the next 3-5 weeks to allow surgery to be completed.  With that understanding, we moved for with the case.  The right radial was sterilely prepped and draped.  A 6-French sheath was placed using the modified Seldinger technique and a micro puncture set.  We were able to traverse the right  subclavian area and get into the ascending aorta using a wooly wire.  We used a XB 3 cm 6-French left coronary guide system.  We took guiding shots.  We then attempted to do fractional flow reserve but the equipment malfunctioned.  We then resorted to intravascular ultrasound.  The vessel diameter distal to the stent was 3.5 x 3.2 mm.  The cross-sectional area in the tightest region of in-stent restenosis was around 3.3 mm2.  The stent appeared undeployed in the mid segment.  The postdilatation balloon diameter at the time of initial therapy was 3.25 mm.  The proximal stent was widely patent with diameter of 3.5 x 3.8 proximal to the stent.  This information led [place] to believe that she had underexpansion, although good apposition of the stent underexpansion is likely the cause of restenosis.  We then performed cutting balloon angioplasty using a 15-mm long x 3.5 mm device.  We did a total of 2 minutes 15 seconds of balloon occlusion.  Balloon occlusion did reproduce the pain that the patient had 1 month ago that led to Dr. Norris Cross evaluation.  A nice angiographic result was obtained.  Post intervention, this was not performed.  The catheter was removed over a guidewire.  The sheath was removed and hemostasis was achieved with wristband.  The patient received 600 mg of Plavix before the procedure and was given a bolus followed by infusion of Angiomax.  Preintervention ACT was 380 seconds.  CONCLUSION: 1. Moderately severe DES ISR secondary to stent underexpansion with a     3.0 stent diameter within a 3.6 mm (on average).  Native vessel     diameter. 2. Successful expansion to 3.5 using a 3.5 mm cutting balloon. 3. Reproduction of the patient's symptoms with angioplasty. 4. High-grade mid to distal LAD disease involving a bifurcation into     relatively small diameter continuation branches.  We chose last May     to not treat this area unless continued symptoms.  If she has      recurrent symptoms now that we have reangioplasted the LAD ISR then     perhaps we will need to tackle the mid lesion.  The risk would     appear to be quite high for ischemic complications.  PLAN:  Aspirin and Plavix.  Proceed with surgery at some point in the next 4-6 weeks.  Plavix can be safely discontinued starting 3-4 weeks from now.  She should resume Plavix after her hip replacement.     Lyn Records, M.D.     HWS/MEDQ  D:  10/10/2010  T:  10/11/2010  Job:  [id number]  cc:   Armanda Magic, M.D. Bryan Lemma. Manus Gunning, M.D.  Electronically Signed by Verdis Prime M.D. on 11/22/2010 04:51:49 PM

## 2010-12-24 ENCOUNTER — Other Ambulatory Visit: Payer: Self-pay | Admitting: Family Medicine

## 2010-12-24 DIAGNOSIS — M8580 Other specified disorders of bone density and structure, unspecified site: Secondary | ICD-10-CM

## 2011-01-03 ENCOUNTER — Ambulatory Visit
Admission: RE | Admit: 2011-01-03 | Discharge: 2011-01-03 | Disposition: A | Discharge status: Home / Self Care | Payer: Medicare Other | Source: Ambulatory Visit | Attending: Family Medicine | Admitting: Family Medicine

## 2011-01-03 DIAGNOSIS — M8580 Other specified disorders of bone density and structure, unspecified site: Secondary | ICD-10-CM

## 2011-09-10 DIAGNOSIS — E538 Deficiency of other specified B group vitamins: Secondary | ICD-10-CM | POA: Diagnosis not present

## 2011-09-12 ENCOUNTER — Ambulatory Visit: Payer: Medicare Other | Attending: Ophthalmology | Admitting: Occupational Therapy

## 2011-09-12 DIAGNOSIS — H53419 Scotoma involving central area, unspecified eye: Secondary | ICD-10-CM | POA: Insufficient documentation

## 2011-09-12 DIAGNOSIS — H3530 Unspecified macular degeneration: Secondary | ICD-10-CM | POA: Diagnosis not present

## 2011-09-12 DIAGNOSIS — IMO0001 Reserved for inherently not codable concepts without codable children: Secondary | ICD-10-CM | POA: Diagnosis not present

## 2011-09-24 DIAGNOSIS — M1990 Unspecified osteoarthritis, unspecified site: Secondary | ICD-10-CM | POA: Diagnosis not present

## 2011-09-24 DIAGNOSIS — D649 Anemia, unspecified: Secondary | ICD-10-CM | POA: Diagnosis not present

## 2011-09-24 DIAGNOSIS — E538 Deficiency of other specified B group vitamins: Secondary | ICD-10-CM | POA: Diagnosis not present

## 2011-09-24 DIAGNOSIS — M949 Disorder of cartilage, unspecified: Secondary | ICD-10-CM | POA: Diagnosis not present

## 2011-09-24 DIAGNOSIS — M899 Disorder of bone, unspecified: Secondary | ICD-10-CM | POA: Diagnosis not present

## 2011-10-08 DIAGNOSIS — E538 Deficiency of other specified B group vitamins: Secondary | ICD-10-CM | POA: Diagnosis not present

## 2011-10-23 DIAGNOSIS — Z961 Presence of intraocular lens: Secondary | ICD-10-CM | POA: Diagnosis not present

## 2011-10-23 DIAGNOSIS — H353190 Nonexudative age-related macular degeneration, unspecified eye, stage unspecified: Secondary | ICD-10-CM | POA: Diagnosis not present

## 2011-10-23 DIAGNOSIS — H3530 Unspecified macular degeneration: Secondary | ICD-10-CM | POA: Diagnosis not present

## 2011-10-23 DIAGNOSIS — H31019 Macula scars of posterior pole (postinflammatory) (post-traumatic), unspecified eye: Secondary | ICD-10-CM | POA: Diagnosis not present

## 2011-10-23 DIAGNOSIS — H353290 Exudative age-related macular degeneration, unspecified eye, stage unspecified: Secondary | ICD-10-CM | POA: Diagnosis not present

## 2011-11-04 DIAGNOSIS — Z79899 Other long term (current) drug therapy: Secondary | ICD-10-CM | POA: Diagnosis not present

## 2011-11-04 DIAGNOSIS — E7800 Pure hypercholesterolemia, unspecified: Secondary | ICD-10-CM | POA: Diagnosis not present

## 2011-11-05 DIAGNOSIS — E538 Deficiency of other specified B group vitamins: Secondary | ICD-10-CM | POA: Diagnosis not present

## 2011-11-19 DIAGNOSIS — M1710 Unilateral primary osteoarthritis, unspecified knee: Secondary | ICD-10-CM | POA: Diagnosis not present

## 2011-11-19 DIAGNOSIS — M169 Osteoarthritis of hip, unspecified: Secondary | ICD-10-CM | POA: Diagnosis not present

## 2011-11-26 DIAGNOSIS — M1710 Unilateral primary osteoarthritis, unspecified knee: Secondary | ICD-10-CM | POA: Diagnosis not present

## 2011-12-03 DIAGNOSIS — M1710 Unilateral primary osteoarthritis, unspecified knee: Secondary | ICD-10-CM | POA: Diagnosis not present

## 2011-12-03 DIAGNOSIS — E538 Deficiency of other specified B group vitamins: Secondary | ICD-10-CM | POA: Diagnosis not present

## 2011-12-09 DIAGNOSIS — M1710 Unilateral primary osteoarthritis, unspecified knee: Secondary | ICD-10-CM | POA: Diagnosis not present

## 2011-12-17 DIAGNOSIS — M1710 Unilateral primary osteoarthritis, unspecified knee: Secondary | ICD-10-CM | POA: Diagnosis not present

## 2011-12-31 DIAGNOSIS — E538 Deficiency of other specified B group vitamins: Secondary | ICD-10-CM | POA: Diagnosis not present

## 2012-01-07 DIAGNOSIS — I2510 Atherosclerotic heart disease of native coronary artery without angina pectoris: Secondary | ICD-10-CM | POA: Diagnosis not present

## 2012-01-07 DIAGNOSIS — I059 Rheumatic mitral valve disease, unspecified: Secondary | ICD-10-CM | POA: Diagnosis not present

## 2012-01-07 DIAGNOSIS — I2589 Other forms of chronic ischemic heart disease: Secondary | ICD-10-CM | POA: Diagnosis not present

## 2012-01-07 DIAGNOSIS — E7800 Pure hypercholesterolemia, unspecified: Secondary | ICD-10-CM | POA: Diagnosis not present

## 2012-01-22 DIAGNOSIS — I059 Rheumatic mitral valve disease, unspecified: Secondary | ICD-10-CM | POA: Diagnosis not present

## 2012-01-22 DIAGNOSIS — I2589 Other forms of chronic ischemic heart disease: Secondary | ICD-10-CM | POA: Diagnosis not present

## 2012-01-22 DIAGNOSIS — I2510 Atherosclerotic heart disease of native coronary artery without angina pectoris: Secondary | ICD-10-CM | POA: Diagnosis not present

## 2012-01-22 DIAGNOSIS — E7800 Pure hypercholesterolemia, unspecified: Secondary | ICD-10-CM | POA: Diagnosis not present

## 2012-01-28 DIAGNOSIS — E538 Deficiency of other specified B group vitamins: Secondary | ICD-10-CM | POA: Diagnosis not present

## 2012-01-30 ENCOUNTER — Ambulatory Visit: Payer: Medicare Other | Admitting: Internal Medicine

## 2012-03-23 DIAGNOSIS — M1990 Unspecified osteoarthritis, unspecified site: Secondary | ICD-10-CM | POA: Diagnosis not present

## 2012-03-23 DIAGNOSIS — E538 Deficiency of other specified B group vitamins: Secondary | ICD-10-CM | POA: Diagnosis not present

## 2012-03-23 DIAGNOSIS — M899 Disorder of bone, unspecified: Secondary | ICD-10-CM | POA: Diagnosis not present

## 2012-04-15 DIAGNOSIS — Z961 Presence of intraocular lens: Secondary | ICD-10-CM | POA: Diagnosis not present

## 2012-04-15 DIAGNOSIS — H353290 Exudative age-related macular degeneration, unspecified eye, stage unspecified: Secondary | ICD-10-CM | POA: Diagnosis not present

## 2012-04-15 DIAGNOSIS — H353190 Nonexudative age-related macular degeneration, unspecified eye, stage unspecified: Secondary | ICD-10-CM | POA: Diagnosis not present

## 2012-04-15 DIAGNOSIS — H3530 Unspecified macular degeneration: Secondary | ICD-10-CM | POA: Diagnosis not present

## 2012-04-15 DIAGNOSIS — H31019 Macula scars of posterior pole (postinflammatory) (post-traumatic), unspecified eye: Secondary | ICD-10-CM | POA: Diagnosis not present

## 2012-04-19 DIAGNOSIS — E538 Deficiency of other specified B group vitamins: Secondary | ICD-10-CM | POA: Diagnosis not present

## 2012-04-19 DIAGNOSIS — Z23 Encounter for immunization: Secondary | ICD-10-CM | POA: Diagnosis not present

## 2012-05-07 DIAGNOSIS — R269 Unspecified abnormalities of gait and mobility: Secondary | ICD-10-CM | POA: Diagnosis not present

## 2012-05-07 DIAGNOSIS — R279 Unspecified lack of coordination: Secondary | ICD-10-CM | POA: Diagnosis not present

## 2012-05-07 DIAGNOSIS — M6281 Muscle weakness (generalized): Secondary | ICD-10-CM | POA: Diagnosis not present

## 2012-05-10 ENCOUNTER — Encounter: Payer: Self-pay | Admitting: Cardiology

## 2012-05-11 ENCOUNTER — Ambulatory Visit (INDEPENDENT_AMBULATORY_CARE_PROVIDER_SITE_OTHER): Payer: Medicare Other | Admitting: Internal Medicine

## 2012-05-11 ENCOUNTER — Encounter: Payer: Self-pay | Admitting: Internal Medicine

## 2012-05-11 VITALS — BP 129/86 | HR 79 | Ht 64.5 in | Wt 180.1 lb

## 2012-05-11 DIAGNOSIS — R609 Edema, unspecified: Secondary | ICD-10-CM

## 2012-05-11 DIAGNOSIS — I5022 Chronic systolic (congestive) heart failure: Secondary | ICD-10-CM | POA: Diagnosis not present

## 2012-05-11 DIAGNOSIS — I454 Nonspecific intraventricular block: Secondary | ICD-10-CM | POA: Diagnosis not present

## 2012-05-11 DIAGNOSIS — I5032 Chronic diastolic (congestive) heart failure: Secondary | ICD-10-CM | POA: Insufficient documentation

## 2012-05-11 DIAGNOSIS — I509 Heart failure, unspecified: Secondary | ICD-10-CM

## 2012-05-11 DIAGNOSIS — I2589 Other forms of chronic ischemic heart disease: Secondary | ICD-10-CM

## 2012-05-11 DIAGNOSIS — I447 Left bundle-branch block, unspecified: Secondary | ICD-10-CM

## 2012-05-11 DIAGNOSIS — I255 Ischemic cardiomyopathy: Secondary | ICD-10-CM

## 2012-05-11 NOTE — Progress Notes (Signed)
 ELECTROPHYSIOLOGY CONSULT NOTE  Patient ID: [name], MRN: [id number], DOB/AGE: 10/12/1922 78 y.o. Admit date: (Not on file) Date of Consult: 05/11/2012  Primary Physician: User Centricity, MD Primary Cardiologist: TT  Chief Complaint: query ICD   HPI [name] is a 78 y.o. female referred for consideration of an ICD. She was found to 3 years ago because of symptoms of congestive heart failure to have coronary artery disease and underwent stenting of right coronary artery subsequently complicated by in-stent restenosis. She denies significant chest pain at this time she climb a flight of stairs. She has chronic lower extremity edema but no nocturnal dyspnea. She has had no palpitations and has no syncope. Her functional capacity is quite stable according to her daughter, probably class II   Past Medical History  Diagnosis Date  . Osteoarthritis   . Macular degeneration   . Breast cancer   . Vitamin B12 deficiency   . Mild mitral regurgitation     Mild TR   . CAD (coronary artery disease)     w/ DES to LAD with residual diagonal disease to small for PCI  . Osteopenia   . LVF (left ventricular failure)     Mild, w/ PASP 32mmHg      Surgical History:  Past Surgical History  Procedure Date  . Cardiac catheterization      Home Meds: Prior to Admission medications   Medication Sig Start Date End Date Taking? Authorizing Provider  alendronate (FOSAMAX) 70 MG tablet Take 70 mg by mouth every 7 (seven) days. Take in the morning with a full glass of water, on an empty stomach, and do not take anything else by mouth or lie down for the next 30 min.    Yes Historical Provider, MD  ascorbic acid (VITAMIN C) 500 MG tablet Take 500 mg by mouth daily.   Yes Historical Provider, MD  aspirin 325 MG tablet Take 325 mg by mouth daily.   Yes Historical Provider, MD  Carvedilol (COREG PO) Take 3.125 mg by mouth 2 (two) times daily.   Yes Historical Provider, MD  clopidogrel (PLAVIX) 75  MG tablet Take 75 mg by mouth daily.     Yes Historical Provider, MD  Furosemide (LASIX PO) Take 20 mg by mouth daily.   Yes Historical Provider, MD  isosorbide mononitrate (IMDUR) 60 MG 24 hr tablet Take 60 mg by mouth daily. 1 and 1/2 tablet daily.   Yes Historical Provider, MD  meloxicam (MOBIC) 7.5 MG tablet Take 7.5 mg by mouth daily.   Yes Historical Provider, MD  Multiple Vitamins-Minerals (VISION-VITE PRESERVE PO) Take 1 capsule by mouth daily.   Yes Historical Provider, MD  nitroGLYCERIN (NITROSTAT) 0.4 MG SL tablet Place 0.4 mg under the tongue as needed.   Yes Historical Provider, MD  RAMIPRIL PO Take 1 tablet by mouth daily.   Yes Historical Provider, MD  simvastatin (ZOCOR) 10 MG tablet Take 10 mg by mouth daily.   Yes Historical Provider, MD  traMADol (ULTRAM) 50 MG tablet Take 50 mg by mouth 4 (four) times daily as needed. for pain    Yes Historical Provider, MD  VITAMIN D, CHOLECALCIFEROL, PO Take 5,000 mg by mouth daily.   Yes Historical Provider, MD      Allergies: Allergies no known allergies  History   Social History  . Marital Status: Married    Spouse Name: N/A    Number of Children: N/A  . Years of Education: N/A   Occupational   History  . Not on file.   Social History Main Topics  . Smoking status: Former Smoker -- 1.0 packs/day    Types: Cigarettes  . Smokeless tobacco: Never Used  . Alcohol Use: Yes     Wine Occassionally  . Drug Use:   . Sexually Active:    Other Topics Concern  . Not on file   Social History Narrative  . No narrative on file     No family history on file.   ROS:  Please see the history of present illness.     All other systems reviewed and negative.    Physical Exam: Blood pressure 129/86, pulse 79, height 5' 4.5" (1.638 m), weight 180 lb 1.9 oz (81.702 kg). General: Well developed, well nourished female in no acute distress. Head: Normocephalic, atraumatic, sclera non-icteric, no xanthomas, nares are without  discharge. Lymph Nodes:  none Back: without scoliosis/kyphosis , no CVA tendersness Neck: Negative for carotid bruits. JVD not elevated. Lungs: Clear bilaterally to auscultation without wheezes, rales, or rhonchi. Breathing is unlabored. Heart: RRR with S1 S2. 2**/6 systolic murmur , rubs, or gallops appreciated. Abdomen: Soft, non-tender, non-distended with normoactive bowel sounds. No hepatomegaly. No rebound/guarding. No obvious abdominal masses. Msk:  Strength and tone appear normal for age. Extremities: No clubbing or cyanosis. No edema.  Distal pedal pulses are 2+ and equal bilaterally. Skin: Warm and Dry Neuro: Alert and oriented X 3. CN III-XII intact Grossly normal sensory and motor function . Psych:  Responds to questions appropriately with a normal affect.      Labs: Cardiac Enzymes No results found for this basename: CKTOTAL:4,CKMB:4,TROPONINI:4 in the last 72 hours CBC Lab Results  Component Value Date   WBC 7.8 11/21/2010   HGB 8.8 DELTA CHECK NOTED REPEATED TO VERIFY* 11/21/2010   HCT 26.6* 11/21/2010   MCV 89.9 11/21/2010   PLT 181 DELTA CHECK NOTED REPEATED TO VERIFY 11/21/2010   PROTIME: No results found for this basename: LABPROT:3,INR:3 in the last 72 hours Chemistry No results found for this basename: NA,K,CL,CO2,BUN,CREATININE,CALCIUM,LABALBU,PROT,BILITOT,ALKPHOS,ALT,AST,GLUCOSE in the last 168 hours Lipids Lab Results  Component Value Date   CHOL  Value: 148        ATP III CLASSIFICATION:  <200     mg/dL   Desirable  200-239  mg/dL   Borderline High  >=240    mg/dL   High        01/04/2010   HDL 52 01/04/2010   LDLCALC  Value: 84        Total Cholesterol/HDL:CHD Risk Coronary Heart Disease Risk Table                     Men   Women  1/2 Average Risk   3.4   3.3  Average Risk       5.0   4.4  2 X Average Risk   9.6   7.1  3 X Average Risk  23.4   11.0        Use the calculated Patient Ratio above and the CHD Risk Table to determine the patient's CHD Risk.        ATP  III CLASSIFICATION (LDL):  <100     mg/dL   Optimal  100-129  mg/dL   Near or Above                    Optimal  130-159  mg/dL   Borderline  160-189  mg/dL   High  >190       mg/dL   Very High 01/04/2010   TRIG 60 01/04/2010   BNP No results found for this basename: probnp   Miscellaneous No results found for this basename: DDIMER    Radiology/Studies:  No results found.  EKG: sinus rhythm with left axis deviation and left bundle branch block QRS duration of 140-150 ms   Assessment and Plan:   Steven Klein   

## 2012-05-11 NOTE — Patient Instructions (Addendum)
Your physician recommends that you schedule a follow-up appointment as needed with Dr. Klein  

## 2012-05-12 ENCOUNTER — Other Ambulatory Visit: Payer: Self-pay | Admitting: *Deleted

## 2012-05-12 ENCOUNTER — Encounter: Payer: Self-pay | Admitting: *Deleted

## 2012-05-12 ENCOUNTER — Telehealth: Payer: Self-pay | Admitting: Internal Medicine

## 2012-05-12 DIAGNOSIS — I509 Heart failure, unspecified: Secondary | ICD-10-CM

## 2012-05-12 DIAGNOSIS — M6281 Muscle weakness (generalized): Secondary | ICD-10-CM | POA: Diagnosis not present

## 2012-05-12 DIAGNOSIS — R279 Unspecified lack of coordination: Secondary | ICD-10-CM | POA: Diagnosis not present

## 2012-05-12 DIAGNOSIS — R269 Unspecified abnormalities of gait and mobility: Secondary | ICD-10-CM | POA: Diagnosis not present

## 2012-05-12 NOTE — Assessment & Plan Note (Signed)
The patient has persistent LV dysfunction in the setting of mixed cardiomyopathy on  Guideline directed medical therapy It is reasonable to consider ICD for primary prevention and we had a very lengthy >50 min discussion regarding the impact of ICD on the mode of dying, specifcally removing SCD as a mode of dying.  She was clear that she did NOT want to do that See below

## 2012-05-12 NOTE — Assessment & Plan Note (Signed)
She has class 2 CHF with LBBB and as such could be considered  For CRT, a benefit that does not attenuate nearly as much as ICD.  We discussed pacer v ICD implant, As above and she would like to consider CRT-P The benefits and risks were reviewed including but not limited to death,  perforation, infection, lead dislodgement and device malfunction.  The patient understands agrees and is willing to proceed. i reviewed this with the patient and her daughter and her husband

## 2012-05-12 NOTE — Telephone Encounter (Signed)
plz return call to pt at hm# to discuss CRT procedure

## 2012-05-12 NOTE — Assessment & Plan Note (Signed)
As above.

## 2012-05-12 NOTE — Telephone Encounter (Signed)
Amber talked to pt.

## 2012-05-13 ENCOUNTER — Telehealth: Payer: Self-pay | Admitting: Internal Medicine

## 2012-05-13 DIAGNOSIS — R279 Unspecified lack of coordination: Secondary | ICD-10-CM | POA: Diagnosis not present

## 2012-05-13 DIAGNOSIS — M6281 Muscle weakness (generalized): Secondary | ICD-10-CM | POA: Diagnosis not present

## 2012-05-13 DIAGNOSIS — R269 Unspecified abnormalities of gait and mobility: Secondary | ICD-10-CM | POA: Diagnosis not present

## 2012-05-13 NOTE — Telephone Encounter (Signed)
New problem:  eval for physical therapy last week.  Please advise if patient should continue physical therapy or not if so, what are the parameter .

## 2012-05-17 DIAGNOSIS — R269 Unspecified abnormalities of gait and mobility: Secondary | ICD-10-CM | POA: Diagnosis not present

## 2012-05-17 DIAGNOSIS — E538 Deficiency of other specified B group vitamins: Secondary | ICD-10-CM | POA: Diagnosis not present

## 2012-05-17 DIAGNOSIS — R279 Unspecified lack of coordination: Secondary | ICD-10-CM | POA: Diagnosis not present

## 2012-05-17 DIAGNOSIS — M6281 Muscle weakness (generalized): Secondary | ICD-10-CM | POA: Diagnosis not present

## 2012-05-17 NOTE — Telephone Encounter (Signed)
Ok to [employer] from my perspective  steve

## 2012-05-19 NOTE — Telephone Encounter (Signed)
Attempted to return call to number provided however, the number is incorrect.

## 2012-05-20 ENCOUNTER — Other Ambulatory Visit (INDEPENDENT_AMBULATORY_CARE_PROVIDER_SITE_OTHER): Payer: Medicare Other

## 2012-05-20 DIAGNOSIS — I509 Heart failure, unspecified: Secondary | ICD-10-CM

## 2012-05-20 LAB — PROTIME-INR
INR: 1 ratio (ref 0.8–1.0)
Prothrombin Time: 10.8 s (ref 10.2–12.4)

## 2012-05-20 LAB — CBC WITH DIFFERENTIAL/PLATELET
Basophils Absolute: 0 10*3/uL (ref 0.0–0.1)
Basophils Relative: 0.3 % (ref 0.0–3.0)
Eosinophils Absolute: 0.1 10*3/uL (ref 0.0–0.7)
Eosinophils Relative: 1.8 % (ref 0.0–5.0)
HCT: 32.8 % — ABNORMAL LOW (ref 36.0–46.0)
Hemoglobin: 10.6 g/dL — ABNORMAL LOW (ref 12.0–15.0)
Lymphocytes Relative: 20.1 % (ref 12.0–46.0)
Lymphs Abs: 1.3 10*3/uL (ref 0.7–4.0)
MCHC: 32.4 g/dL (ref 30.0–36.0)
MCV: 95.7 fl (ref 78.0–100.0)
Monocytes Absolute: 0.6 10*3/uL (ref 0.1–1.0)
Monocytes Relative: 9.2 % (ref 3.0–12.0)
Neutro Abs: 4.3 10*3/uL (ref 1.4–7.7)
Neutrophils Relative %: 68.6 % (ref 43.0–77.0)
Platelets: 241 10*3/uL (ref 150.0–400.0)
RBC: 3.43 Mil/uL — ABNORMAL LOW (ref 3.87–5.11)
RDW: 14.4 % (ref 11.5–14.6)
WBC: 6.3 10*3/uL (ref 4.5–10.5)

## 2012-05-20 LAB — APTT: aPTT: 26.7 s (ref 21.7–28.8)

## 2012-05-20 LAB — BASIC METABOLIC PANEL
BUN: 33 mg/dL — ABNORMAL HIGH (ref 6–23)
CO2: 27 mEq/L (ref 19–32)
Calcium: 8.5 mg/dL (ref 8.4–10.5)
Chloride: 105 mEq/L (ref 96–112)
Creatinine, Ser: 1.1 mg/dL (ref 0.4–1.2)
GFR: 48.89 mL/min — ABNORMAL LOW (ref >60.00)
Glucose, Bld: 108 mg/dL — ABNORMAL HIGH (ref 70–99)
Potassium: 4.1 mEq/L (ref 3.5–5.1)
Sodium: 140 mEq/L (ref 135–145)

## 2012-05-24 ENCOUNTER — Encounter (HOSPITAL_COMMUNITY): Payer: Self-pay | Admitting: Pharmacy Technician

## 2012-05-26 DIAGNOSIS — I428 Other cardiomyopathies: Secondary | ICD-10-CM | POA: Diagnosis not present

## 2012-05-26 DIAGNOSIS — I509 Heart failure, unspecified: Secondary | ICD-10-CM | POA: Diagnosis not present

## 2012-05-26 DIAGNOSIS — I447 Left bundle-branch block, unspecified: Secondary | ICD-10-CM | POA: Diagnosis not present

## 2012-05-26 DIAGNOSIS — I5022 Chronic systolic (congestive) heart failure: Secondary | ICD-10-CM | POA: Diagnosis not present

## 2012-05-26 DIAGNOSIS — T82190A Other mechanical complication of cardiac electrode, initial encounter: Secondary | ICD-10-CM | POA: Diagnosis not present

## 2012-05-26 MED ORDER — SODIUM CHLORIDE 0.9 % IR SOLN
80.0000 mg | Status: DC
  Filled 2012-05-26: qty 2

## 2012-05-26 MED ORDER — CEFAZOLIN SODIUM-DEXTROSE 2-3 GM-% IV SOLR
2.0000 g | INTRAVENOUS | Status: DC
  Filled 2012-05-26: qty 50

## 2012-05-27 ENCOUNTER — Ambulatory Visit (HOSPITAL_COMMUNITY)
Admission: RE | Admit: 2012-05-27 | Discharge: 2012-05-29 | Disposition: A | Discharge status: Home / Self Care | Payer: Medicare Other | Source: Ambulatory Visit | Attending: Internal Medicine | Admitting: Internal Medicine

## 2012-05-27 ENCOUNTER — Ambulatory Visit (HOSPITAL_COMMUNITY): Payer: Medicare Other

## 2012-05-27 ENCOUNTER — Encounter (HOSPITAL_COMMUNITY): Payer: Self-pay | Admitting: General Practice

## 2012-05-27 ENCOUNTER — Encounter (HOSPITAL_COMMUNITY)
Admission: RE | Disposition: A | Discharge status: Home / Self Care | Payer: Self-pay | Source: Ambulatory Visit | Attending: Internal Medicine

## 2012-05-27 DIAGNOSIS — I509 Heart failure, unspecified: Secondary | ICD-10-CM | POA: Insufficient documentation

## 2012-05-27 DIAGNOSIS — I428 Other cardiomyopathies: Secondary | ICD-10-CM

## 2012-05-27 DIAGNOSIS — Z01811 Encounter for preprocedural respiratory examination: Secondary | ICD-10-CM | POA: Diagnosis not present

## 2012-05-27 DIAGNOSIS — Z950 Presence of cardiac pacemaker: Secondary | ICD-10-CM

## 2012-05-27 DIAGNOSIS — I447 Left bundle-branch block, unspecified: Secondary | ICD-10-CM | POA: Insufficient documentation

## 2012-05-27 DIAGNOSIS — I5022 Chronic systolic (congestive) heart failure: Secondary | ICD-10-CM | POA: Insufficient documentation

## 2012-05-27 DIAGNOSIS — T82190A Other mechanical complication of cardiac electrode, initial encounter: Secondary | ICD-10-CM | POA: Insufficient documentation

## 2012-05-27 DIAGNOSIS — Y831 Surgical operation with implant of artificial internal device as the cause of abnormal reaction of the patient, or of later complication, without mention of misadventure at the time of the procedure: Secondary | ICD-10-CM | POA: Insufficient documentation

## 2012-05-27 HISTORY — DX: Left bundle-branch block, unspecified: I44.7

## 2012-05-27 HISTORY — PX: BI-VENTRICULAR PACEMAKER INSERTION: SHX5462

## 2012-05-27 HISTORY — DX: Presence of cardiac pacemaker: Z95.0

## 2012-05-27 HISTORY — DX: Cardiomyopathy, unspecified: I42.9

## 2012-05-27 HISTORY — PX: INSERT / REPLACE / REMOVE PACEMAKER: SUR710

## 2012-05-27 LAB — SURGICAL PCR SCREEN
MRSA, PCR: NEGATIVE
Staphylococcus aureus: NEGATIVE

## 2012-05-27 SURGERY — BI-VENTRICULAR PACEMAKER INSERTION (CRT-P)
Anesthesia: LOCAL

## 2012-05-27 MED ORDER — FENTANYL CITRATE 0.05 MG/ML IJ SOLN
INTRAMUSCULAR | Status: AC
  Filled 2012-05-27: qty 2

## 2012-05-27 MED ORDER — CEFAZOLIN SODIUM-DEXTROSE 2-3 GM-% IV SOLR
INTRAVENOUS | Status: AC
  Filled 2012-05-27: qty 50

## 2012-05-27 MED ORDER — MELOXICAM 7.5 MG PO TABS
7.5000 mg | ORAL_TABLET | Freq: Every day | ORAL | Status: DC
  Administered 2012-05-27 – 2012-05-29 (×3): 7.5 mg via ORAL
  Filled 2012-05-27 (×3): qty 1

## 2012-05-27 MED ORDER — ASPIRIN 325 MG PO TABS
325.0000 mg | ORAL_TABLET | Freq: Every day | ORAL | Status: DC
  Administered 2012-05-28 – 2012-05-29 (×2): 325 mg via ORAL
  Filled 2012-05-27 (×3): qty 1

## 2012-05-27 MED ORDER — LIDOCAINE HCL (PF) 1 % IJ SOLN
INTRAMUSCULAR | Status: AC
  Filled 2012-05-27: qty 60

## 2012-05-27 MED ORDER — SODIUM CHLORIDE 0.45 % IV SOLN
INTRAVENOUS | Status: DC
  Administered 2012-05-27: 1000 mL via INTRAVENOUS

## 2012-05-27 MED ORDER — MUPIROCIN 2 % EX OINT
TOPICAL_OINTMENT | CUTANEOUS | Status: AC
  Filled 2012-05-27: qty 22

## 2012-05-27 MED ORDER — MUPIROCIN 2 % EX OINT
TOPICAL_OINTMENT | Freq: Two times a day (BID) | CUTANEOUS | Status: DC
  Administered 2012-05-27: 09:00:00 via NASAL
  Filled 2012-05-27: qty 22

## 2012-05-27 MED ORDER — SODIUM CHLORIDE 0.9 % IV SOLN: 250.0000 mL | INTRAVENOUS | Status: DC

## 2012-05-27 MED ORDER — CHLORHEXIDINE GLUCONATE 4 % EX LIQD: 60.0000 mL | Freq: Once | CUTANEOUS | Status: DC

## 2012-05-27 MED ORDER — SODIUM CHLORIDE 0.9 % IJ SOLN: 3.0000 mL | Freq: Two times a day (BID) | INTRAMUSCULAR | Status: DC

## 2012-05-27 MED ORDER — MIDAZOLAM HCL 5 MG/5ML IJ SOLN
INTRAMUSCULAR | Status: AC
  Filled 2012-05-27: qty 5

## 2012-05-27 MED ORDER — ACETAMINOPHEN 325 MG PO TABS
325.0000 mg | ORAL_TABLET | ORAL | Status: DC | PRN
  Administered 2012-05-28: 650 mg via ORAL
  Filled 2012-05-27: qty 2

## 2012-05-27 MED ORDER — ISOSORBIDE MONONITRATE ER 60 MG PO TB24
90.0000 mg | ORAL_TABLET | Freq: Every day | ORAL | Status: DC
  Administered 2012-05-28 – 2012-05-29 (×2): 90 mg via ORAL
  Filled 2012-05-27 (×3): qty 1

## 2012-05-27 MED ORDER — SODIUM CHLORIDE 0.9 % IV SOLN: INTRAVENOUS | Status: AC

## 2012-05-27 MED ORDER — SODIUM CHLORIDE 0.9 % IJ SOLN: 3.0000 mL | INTRAMUSCULAR | Status: DC | PRN

## 2012-05-27 MED ORDER — FUROSEMIDE 20 MG PO TABS
20.0000 mg | ORAL_TABLET | Freq: Every day | ORAL | Status: DC
  Administered 2012-05-28: 20 mg via ORAL
  Filled 2012-05-27 (×4): qty 1

## 2012-05-27 MED ORDER — CEFAZOLIN SODIUM 1-5 GM-% IV SOLN
1.0000 g | Freq: Four times a day (QID) | INTRAVENOUS | Status: AC
  Administered 2012-05-27 – 2012-05-28 (×3): 1 g via INTRAVENOUS
  Filled 2012-05-27 (×3): qty 50

## 2012-05-27 MED ORDER — TRAMADOL HCL 50 MG PO TABS: 100.0000 mg | ORAL_TABLET | Freq: Two times a day (BID) | ORAL | Status: DC | PRN

## 2012-05-27 MED ORDER — HEPARIN (PORCINE) IN NACL 2-0.9 UNIT/ML-% IJ SOLN
INTRAMUSCULAR | Status: AC
  Filled 2012-05-27: qty 500

## 2012-05-27 MED ORDER — SIMVASTATIN 10 MG PO TABS
10.0000 mg | ORAL_TABLET | Freq: Every day | ORAL | Status: DC
  Administered 2012-05-27 – 2012-05-28 (×2): 10 mg via ORAL
  Filled 2012-05-27 (×3): qty 1

## 2012-05-27 MED ORDER — ONDANSETRON HCL 4 MG/2ML IJ SOLN: 4.0000 mg | Freq: Four times a day (QID) | INTRAMUSCULAR | Status: DC | PRN

## 2012-05-27 MED ORDER — CARVEDILOL 3.125 MG PO TABS
3.1250 mg | ORAL_TABLET | Freq: Two times a day (BID) | ORAL | Status: DC
  Administered 2012-05-27 – 2012-05-29 (×4): 3.125 mg via ORAL
  Filled 2012-05-27 (×6): qty 1

## 2012-05-27 MED ORDER — RAMIPRIL 2.5 MG PO CAPS
2.5000 mg | ORAL_CAPSULE | Freq: Every day | ORAL | Status: DC
  Administered 2012-05-28 – 2012-05-29 (×2): 2.5 mg via ORAL
  Filled 2012-05-27 (×3): qty 1

## 2012-05-27 MED ORDER — ALENDRONATE SODIUM 70 MG PO TABS: 70.0000 mg | ORAL_TABLET | ORAL | Status: DC

## 2012-05-27 MED ORDER — CLOPIDOGREL BISULFATE 75 MG PO TABS
75.0000 mg | ORAL_TABLET | Freq: Every day | ORAL | Status: DC
  Administered 2012-05-28 – 2012-05-29 (×2): 75 mg via ORAL
  Filled 2012-05-27 (×2): qty 1

## 2012-05-27 NOTE — Interval H&P Note (Signed)
History and Physical Interval Note:  05/27/2012 9:48 AM  [name]  has presented today for surgery, with the diagnosis of chf  The various methods of treatment have been discussed with the patient and family. After consideration of risks, benefits and other options for treatment, the patient has consented to  Procedure(s) (LRB) with comments: BI-VENTRICULAR PACEMAKER INSERTION (CRT-P) (N/A) as a surgical intervention .  The patient's history has been reviewed, patient examined, no change in status, stable for surgery.  I have reviewed the patient's chart and labs.  Questions were answered to the patient's satisfaction.     Sherryl Manges

## 2012-05-27 NOTE — H&P (View-Only) (Signed)
ELECTROPHYSIOLOGY CONSULT NOTE  Patient ID: [name], MRN: [id number], DOB/AGE: 1922-12-16 78 y.o. Admit date: (Not on file) Date of Consult: 05/11/2012  Primary Physician: User Centricity, MD Primary Cardiologist: TT  Chief Complaint: query ICD   HPI [name] is a 78 y.o. female referred for consideration of an ICD. She was found to 3 years ago because of symptoms of congestive heart failure to have coronary artery disease and underwent stenting of right coronary artery subsequently complicated by in-stent restenosis. She denies significant chest pain at this time she climb a flight of stairs. She has chronic lower extremity edema but no nocturnal dyspnea. She has had no palpitations and has no syncope. Her functional capacity is quite stable according to her daughter, probably class II   Past Medical History  Diagnosis Date  . Osteoarthritis   . Macular degeneration   . Breast cancer   . Vitamin B12 deficiency   . Mild mitral regurgitation     Mild TR   . CAD (coronary artery disease)     w/ DES to LAD with residual diagonal disease to small for PCI  . Osteopenia   . LVF (left ventricular failure)     Mild, w/ PASP      Surgical History:  Past Surgical History  Procedure Date  . Cardiac catheterization      Home Meds: Prior to Admission medications   Medication Sig Start Date End Date Taking? Authorizing Provider  alendronate (FOSAMAX) 70 MG tablet Take 70 mg by mouth every 7 (seven) days. Take in the morning with a full glass of water, on an empty stomach, and do not take anything else by mouth or lie down for the next 30 min.    Yes Historical Provider, MD  ascorbic acid (VITAMIN C) 500 MG tablet Take 500 mg by mouth daily.   Yes Historical Provider, MD  aspirin 325 MG tablet Take 325 mg by mouth daily.   Yes Historical Provider, MD  Carvedilol (COREG PO) Take 3.125 mg by mouth 2 (two) times daily.   Yes Historical Provider, MD  clopidogrel (PLAVIX) 75  MG tablet Take 75 mg by mouth daily.     Yes Historical Provider, MD  Furosemide (LASIX PO) Take 20 mg by mouth daily.   Yes Historical Provider, MD  isosorbide mononitrate (IMDUR) 60 MG 24 hr tablet Take 60 mg by mouth daily. 1 and 1/2 tablet daily.   Yes Historical Provider, MD  meloxicam (MOBIC) 7.5 MG tablet Take 7.5 mg by mouth daily.   Yes Historical Provider, MD  Multiple Vitamins-Minerals (VISION-VITE PRESERVE PO) Take 1 capsule by mouth daily.   Yes Historical Provider, MD  nitroGLYCERIN (NITROSTAT) 0.4 MG SL tablet Place 0.4 mg under the tongue as needed.   Yes Historical Provider, MD  RAMIPRIL PO Take 1 tablet by mouth daily.   Yes Historical Provider, MD  simvastatin (ZOCOR) 10 MG tablet Take 10 mg by mouth daily.   Yes Historical Provider, MD  traMADol (ULTRAM) 50 MG tablet Take 50 mg by mouth 4 (four) times daily as needed. for pain    Yes Historical Provider, MD  VITAMIN D, CHOLECALCIFEROL, PO Take 5,000 mg by mouth daily.   Yes Historical Provider, MD      Allergies: Allergies no known allergies  History   Social History  . Marital Status: Married    Spouse Name: N/A    Number of Children: N/A  . Years of Education: N/A   Occupational  History  . Not on file.   Social History Main Topics  . Smoking status: Former Smoker -- 1.0 packs/day    Types: Cigarettes  . Smokeless tobacco: Never Used  . Alcohol Use: Yes     Wine Occassionally  . Drug Use:   . Sexually Active:    Other Topics Concern  . Not on file   Social History Narrative  . No narrative on file     No family history on file.   ROS:  Please see the history of present illness.     All other systems reviewed and negative.    Physical Exam: Blood pressure 129/86, pulse 79, height 5' 4.5" (1.638 m), weight 180 lb 1.9 oz (81.702 kg). General: Well developed, well nourished female in no acute distress. Head: Normocephalic, atraumatic, sclera non-icteric, no xanthomas, nares are without  discharge. Lymph Nodes:  none Back: without scoliosis/kyphosis , no CVA tendersness Neck: Negative for carotid bruits. JVD not elevated. Lungs: Clear bilaterally to auscultation without wheezes, rales, or rhonchi. Breathing is unlabored. Heart: RRR with S1 S2. 2**/6 systolic murmur , rubs, or gallops appreciated. Abdomen: Soft, non-tender, non-distended with normoactive bowel sounds. No hepatomegaly. No rebound/guarding. No obvious abdominal masses. Msk:  Strength and tone appear normal for age. Extremities: No clubbing or cyanosis. No edema.  Distal pedal pulses are 2+ and equal bilaterally. Skin: Warm and Dry Neuro: Alert and oriented X 3. CN III-XII intact Grossly normal sensory and motor function . Psych:  Responds to questions appropriately with a normal affect.      Labs: Cardiac Enzymes No results found for this basename: CKTOTAL:4,CKMB:4,TROPONINI:4 in the last 72 hours CBC Lab Results  Component Value Date   WBC 7.8 11/21/2010   HGB 8.8 DELTA CHECK NOTED REPEATED TO VERIFY* 11/21/2010   HCT 26.6* 11/21/2010   MCV 89.9 11/21/2010   PLT 181 DELTA CHECK NOTED REPEATED TO VERIFY 11/21/2010   PROTIME: No results found for this basename: LABPROT:3,INR:3 in the last 72 hours Chemistry No results found for this basename: NA,K,CL,CO2,BUN,CREATININE,CALCIUM,LABALBU,PROT,BILITOT,ALKPHOS,ALT,AST,GLUCOSE in the last 168 hours Lipids Lab Results  Component Value Date   CHOL  Value: 148        ATP III CLASSIFICATION:  <200     mg/dL   Desirable  161-096  mg/dL   Borderline High  >=045    mg/dL   High        4/0/9811   HDL 52 01/04/2010   LDLCALC  Value: 84        Total Cholesterol/HDL:CHD Risk Coronary Heart Disease Risk Table                     Men   Women  1/2 Average Risk   3.4   3.3  Average Risk       5.0   4.4  2 X Average Risk   9.6   7.1  3 X Average Risk  23.4   11.0        Use the calculated Patient Ratio above and the CHD Risk Table to determine the patient's CHD Risk.        ATP  III CLASSIFICATION (LDL):  <100     mg/dL   Optimal  914-782  mg/dL   Near or Above                    Optimal  130-159  mg/dL   Borderline  956-213  mg/dL   High  >086  mg/dL   Very High 02/09/2955   TRIG 60 01/04/2010   BNP No results found for this basename: probnp   Miscellaneous No results found for this basename: DDIMER    Radiology/Studies:  No results found.  EKG: sinus rhythm with left axis deviation and left bundle branch block QRS duration of 140-150 ms   Assessment and Plan:   Sherryl Manges

## 2012-05-27 NOTE — Progress Notes (Signed)
PHARMACIST - PHYSICIAN COMMUNICATION  Dr.Klein,   CONCERNING: P&T Medication Policy Regarding Oral Bisphosphonates  DESCRIPTION/RECOMMENDATIONS:  Your order for alendronate (Fosamax) has been discontinued at this time.  Alendronate (Fosamax) may cause severe esophageal erosions in patients who are unable to remain upright at least 30 minutes after taking this medication.   Since brief interruptions in therapy are thought to have minimal impact on bone mineral density, the Pharmacy & Therapeutics Committee has established that bisphosphonate orders should be routinely discontinued during hospitalization.   To override this safety policy and permit administration of Fosamax in the hospital, prescribers must write "DO NOT HOLD" in the comments section when placing the order for this class of medications.  Note:  If the patient's post-hospital medical condition warrants safe use of this class of drugs, please resume the pre-hospital regimen upon discharge.  Thank you. Elienai Gailey, Elisha Headland, Pharm.D.  05/27/2012 12:23 PM

## 2012-05-27 NOTE — CV Procedure (Signed)
[name] [id number]  [id number]  Preop Dx: CHF NICM LBBB Postop Dx same/  Procedure: Cardiac resynchronization pacing  Cx: None  Dictation number [id number]  Sherryl Manges, MD 05/27/2012 11:41 AM

## 2012-05-28 ENCOUNTER — Ambulatory Visit (HOSPITAL_COMMUNITY): Payer: Medicare Other

## 2012-05-28 ENCOUNTER — Encounter: Payer: Self-pay | Admitting: *Deleted

## 2012-05-28 ENCOUNTER — Encounter (HOSPITAL_COMMUNITY)
Admission: RE | Disposition: A | Discharge status: Home / Self Care | Payer: Self-pay | Source: Ambulatory Visit | Attending: Internal Medicine

## 2012-05-28 DIAGNOSIS — Z950 Presence of cardiac pacemaker: Secondary | ICD-10-CM | POA: Diagnosis not present

## 2012-05-28 DIAGNOSIS — J9819 Other pulmonary collapse: Secondary | ICD-10-CM | POA: Diagnosis not present

## 2012-05-28 DIAGNOSIS — T82190A Other mechanical complication of cardiac electrode, initial encounter: Secondary | ICD-10-CM | POA: Diagnosis not present

## 2012-05-28 HISTORY — PX: LEAD REVISION: SHX5945

## 2012-05-28 SURGERY — LEAD REVISION
Anesthesia: LOCAL

## 2012-05-28 MED ORDER — MIDAZOLAM HCL 5 MG/5ML IJ SOLN
INTRAMUSCULAR | Status: AC
  Filled 2012-05-28: qty 5

## 2012-05-28 MED ORDER — ACETAMINOPHEN 325 MG PO TABS: 325.0000 mg | ORAL_TABLET | ORAL | Status: DC | PRN

## 2012-05-28 MED ORDER — ONDANSETRON HCL 4 MG/2ML IJ SOLN: 4.0000 mg | Freq: Four times a day (QID) | INTRAMUSCULAR | Status: DC | PRN

## 2012-05-28 MED ORDER — CEFAZOLIN SODIUM-DEXTROSE 2-3 GM-% IV SOLR
2.0000 g | INTRAVENOUS | Status: DC
  Filled 2012-05-28: qty 50

## 2012-05-28 MED ORDER — CHLORHEXIDINE GLUCONATE 4 % EX LIQD: 60.0000 mL | Freq: Once | CUTANEOUS | Status: DC

## 2012-05-28 MED ORDER — FENTANYL CITRATE 0.05 MG/ML IJ SOLN
INTRAMUSCULAR | Status: AC
  Filled 2012-05-28: qty 2

## 2012-05-28 MED ORDER — CEFAZOLIN SODIUM 1-5 GM-% IV SOLN
1.0000 g | Freq: Four times a day (QID) | INTRAVENOUS | Status: AC
  Administered 2012-05-28 – 2012-05-29 (×3): 1 g via INTRAVENOUS
  Filled 2012-05-28 (×3): qty 50

## 2012-05-28 MED ORDER — SODIUM CHLORIDE 0.9 % IR SOLN
80.0000 mg | Status: DC
  Filled 2012-05-28: qty 2

## 2012-05-28 MED ORDER — YOU HAVE A PACEMAKER BOOK
Freq: Once | Status: AC
  Administered 2012-05-28: 19:00:00
  Filled 2012-05-28: qty 1

## 2012-05-28 MED ORDER — SODIUM CHLORIDE 0.45 % IV SOLN
INTRAVENOUS | Status: DC
  Administered 2012-05-28: 09:00:00 via INTRAVENOUS

## 2012-05-28 NOTE — Interval H&P Note (Signed)
History and Physical Interval Note:  05/28/2012 9:57 AM  [name]  has presented today for surgery, with the diagnosis of bad lead  The various methods of treatment have been discussed with the patient and family. After consideration of risks, benefits and other options for treatment, the patient has consented to  Procedure(s) (LRB) with comments: LEAD REVISION (N/A) as a surgical intervention .  The patient's history has been reviewed, patient examined, no change in status, stable for surgery.  I have reviewed the patient's chart and labs.  Questions were answered to the patient's satisfaction.     [name].D.

## 2012-05-28 NOTE — Op Note (Signed)
BiV PPM LV lead repositioning utilizing coronary sinus venography carried out without immediate complication. [id number].

## 2012-05-28 NOTE — H&P (Signed)
  EP Note  Called by Dr. Graciela Husbands regarding [name]'s LV lead which is not capturing this morning. CXR demonstrated no obvious dislodgement. She has minimal hematoma. I have discussed the risk/benefit/goal/expectation of LV lead revision with the patient and she wishes to proceed.  [name], M.D.

## 2012-05-28 NOTE — Progress Notes (Signed)
Orthopedic Tech Progress Note Patient Details:  [name] Jan 15, 1923 [id number]  Patient ID: [name], female   DOB: [birth year], 78 y.o.   MRN: [id number]   [name] 05/28/2012, 3:52 PM PT HAS ARM SLING ON.

## 2012-05-28 NOTE — Op Note (Signed)
Monica, Chang NO.:  [id number]  MEDICAL RECORD NO.:  [id number]  LOCATION:  6527                         FACILITY:  [facility]  PHYSICIAN:  Duke Salvia, MD, FACCDATE OF BIRTH:  25-Jul-1923  DATE OF PROCEDURE: DATE OF DISCHARGE:                              OPERATIVE REPORT   PREOPERATIVE DIAGNOSIS:  Nonischemic cardiomyopathy, congestive heart failure, left bundle-branch block.  POSTOPERATIVE DIAGNOSIS:  Nonischemic cardiomyopathy, congestive heart failure, left bundle-branch block.  PROCEDURE:  Dual-chamber pacemaker implantation with left ventricular lead placement.  Following obtained informed consent, the patient was brought to the electrophysiology laboratory and placed on the fluoroscopic table in supine position.  After routine prep and drape of the left upper chest, lidocaine was infiltrated in prepectoral subclavicular region.  Incision was made and carried down to layer of the prepectoral fascia with electrocautery and sharp dissection.  A pocket was formed similarly. Hemostasis was obtained.  Thereafter, attention was turned to gain access to the extrathoracic left subclavian vein, which was accomplished without difficulty without the aspiration or puncture of the artery.  Three separate venipunctures were accomplished.  Guidewires were placed and retained.  Sequentially, a 7-French, 9.5 [place], and 7-French sheaths were placed, which were passed a St. Jude [id number] cm passive fixation ventricular lead, serial #[id number].  A Medtronic MB-2 coronary sinus cannulation catheter and a St. Jude 1944 passive fixation atrial lead, serial #[id number].  Under fluoroscopic guidance, the RV lead was manipulated.  The apex for the bipolar R-wave was 7.5 with a pace impedance of 940, threshold 0.5, V 0.5 msec.  Current threshold was 0.5 mA and there is no diaphragmatic pacing at 10 V.  This lead was secured to the prepectoral fascia.  We then obtained  ready access to the coronary sinus and in fact with manipulation of the [facility] wire, we were able to cannulate a mid lateral branch, through which we then passed a whisper wire, over which was passed a St. Jude 1258T passive fixation LV lead serial #[id number]. Multiple sites were mapped and lead stability was not great into we found a cephalad directed branch that allowed [place] to put the tip on the higher lateral surface.  LV tip to RV, the ring configuration amplitude was 4.1 with a pace impedance of 1450, a threshold 1.8, V at 0.5 msec. There is no diaphragmatic pacing at 10 V.  The 9.5-French sheath was removed and then the atrial lead previously mentioned was inserted and placed in the right atrial appendage where the bipolar P-wave was 1.9 with a pace impedance of 468, a threshold 1.1, V at 0.5 msec.  Current threshold was 2.3 mA and there is no diaphragmatic pacing at 10 V.  This lead was secured to the prepectoral fascia.  The LV delivery system was then removed and the LV lead was secured to the prepectoral fascia.  The leads were then attached to a St. Medtronic C4TR01 pulse generator, serial [id number].  P synchronous pacing was identified.  The pocket was copiously irrigated with antibiotic containing saline solution. Hemostasis was assured.  Leads and pulse generator were placed in the pocket secured to the prepectoral fascia.  The wound was then  closed in 3 layers in normal fashion.  The wound was washed, dried, and a benzoin Steri-Strip dressing was applied.  Needle counts, sponge counts, and instrument counts were correct at the end of procedure according to staff.  The patient tolerated the procedure without apparent complication.     Duke Salvia, MD, [facility]     SCK/MEDQ  D:  05/27/2012  T:  05/28/2012  Job:  [id number]

## 2012-05-28 NOTE — Progress Notes (Signed)
   ELECTROPHYSIOLOGY ROUNDING NOTE    Patient Name: [name] Date of Encounter: 05-28-2012    SUBJECTIVE:Patient feels well.  No chest pain or shortness of breath.  Minimal incisional soreness.  S/p CRT-P implant 05-27-2012  TELEMETRY: Reviewed telemetry pt in sinus rhythm with ventricular pacing Filed Vitals:   05/27/12 1622 05/27/12 2014 05/28/12 0033 05/28/12 0447  BP: 103/45 132/48 137/59 142/51  Pulse: 76 77 78 80  Temp: 97.5 F (36.4 C) 98 F (36.7 C) 98.1 F (36.7 C) 98.3 F (36.8 C)  TempSrc: Oral Oral Oral Oral  Resp: 16 19 16 28   Height:      Weight:   182 lb 5.1 oz (82.7 kg)   SpO2: 96% 94% 94% 96%    Intake/Output Summary (Last 24 hours) at 05/28/12 9147 Last data filed at 05/27/12 1850  Gross per 24 hour  Intake    240 ml  Output      0 ml  Net    240 ml    Radiology/Studies:  Final result pending, leads in stable position.  PHYSICAL EXAM Left chest without hematoma or ecchymosis.   DEVICE INTERROGATION: Device interrogation demonstrated no LV capture.   plan is to revise LV lead today

## 2012-05-29 ENCOUNTER — Encounter (HOSPITAL_COMMUNITY): Payer: Self-pay | Admitting: Cardiology

## 2012-05-29 ENCOUNTER — Ambulatory Visit (HOSPITAL_COMMUNITY): Payer: Medicare Other

## 2012-05-29 DIAGNOSIS — Z5189 Encounter for other specified aftercare: Secondary | ICD-10-CM | POA: Diagnosis not present

## 2012-05-29 DIAGNOSIS — Z950 Presence of cardiac pacemaker: Secondary | ICD-10-CM

## 2012-05-29 NOTE — Progress Notes (Signed)
     Cardiology Progress Note Patient Name: [name] Date of Encounter: 05/29/2012, 7:54 AM     Subjective  No overnight events. Patient without chest pain or sob. Mild chest soreness at implantation site. Wants to go home.    Objective   Telemetry: BiV pacing 70s  Medications: . aspirin  325 mg Oral Daily  . carvedilol  3.125 mg Oral BID WC  .  ceFAZolin (ANCEF) IV  1 g Intravenous Q6H  . clopidogrel  75 mg Oral Daily  . fentaNYL      . furosemide  20 mg Oral Q breakfast  . isosorbide mononitrate  90 mg Oral Daily  . meloxicam  7.5 mg Oral Daily  . midazolam      . midazolam      . ramipril  2.5 mg Oral Daily  . simvastatin  10 mg Oral q1800  . you have a pacemaker book   Does not apply Once  . DISCONTD:  ceFAZolin (ANCEF) IV  2 g Intravenous On Call  . DISCONTD: chlorhexidine  60 mL Topical Once  . DISCONTD: chlorhexidine  60 mL Topical Once  . DISCONTD: gentamicin irrigation  80 mg Irrigation On Call  . DISCONTD: mupirocin ointment   Nasal BID   . DISCONTD: sodium chloride 50 mL/hr at 05/28/12 0920    Physical Exam: Temp:  [97.5 F (36.4 C)-98.4 F (36.9 C)] 98.4 F (36.9 C) (10/26 0747) Pulse Rate:  [67-81] 71  (10/26 0747) Resp:  [16-24] 16  (10/26 0747) BP: (108-138)/(48-54) 134/54 mmHg (10/26 0747) SpO2:  [93 %-98 %] 98 % (10/26 0747)  General: Pleasant elderly female, in no acute distress. Head: Normocephalic, atraumatic, sclera non-icteric, nares are without discharge.  Neck: Supple. Negative for carotid bruits or JVD Lungs: Clear bilaterally to auscultation without wheezes, rales, or rhonchi. Breathing is unlabored. Chest: Left chest dressing with small amount dried blood.  Heart: RRR S1 S2 without murmurs, rubs, or gallops.  Abdomen: Soft, non-tender, non-distended with normoactive bowel sounds. No rebound/guarding. No obvious abdominal masses. Msk:  Strength and tone appear normal for age. Extremities: Trace BLE edema. No clubbing or cyanosis.  Distal pedal pulses are intact and equal bilaterally. Neuro: Alert and oriented X 3. Moves all extremities spontaneously. Psych:  Responds to questions appropriately with a normal affect.   Intake/Output Summary (Last 24 hours) at 05/29/12 0754 Last data filed at 05/28/12 1700  Gross per 24 hour  Intake    320 ml  Output      0 ml  Net    320 ml    Labs: None  Radiology/Studies:   05/27/12 - BiV ICD Implantation St. Jude 194858 cm passive fixation ventricular lead, serial #[id number] St. Jude 1944 passive fixation atrial lead, serial #[id number] St. Jude 1258T passive fixation LV lead serial #[id number] St. Jude C4TR01 pulse generator, serial #[id number]   05/27/2012 - CHEST - 2 VIEW Findings: The lungs are clear.  Heart size is normal.  No pneumothorax or pleural fluid.  Postoperative change of bilateral mastectomy and axillary dissection is noted.  IMPRESSION: No acute disease.   05/28/2012 - PORTABLE CHEST - 1 VIEW   Findings: A three lead cardiac pacemaker has been placed.  The heart size is normal.  The lung volumes are low.  There is no pneumothorax.  Minimal dependent atelectasis is evident.  Surgical clips are again noted in the axillae bilaterally.  IMPRESSION:  1.  Interval placement of three lead pacemaker without radiographic evidence for   complication. 2.  Low lung volumes and minimal bibasilar atelectasis   05/29/2012 - CHEST - 2 VIEW Findings: Three lead cardiac pacemaker.  Shallow inspiration. Normal heart size and pulmonary vascularity.  Tortuous aorta. Infiltration or atelectasis in the left lung base with mild blunting of left costophrenic angle.  Lucencies in the apices likely represent emphysematous change.  No pneumothorax.  Surgical clips in the axillary regions.  IMPRESSION: Cardiac pacemaker.  Shallow inspiration with infiltration or atelectasis in the left lung base.      Assessment and Plan   NICM w/ Chronic Systolic CHF and LBBB: s/p St. Jude CRT-P implant  10/24. Device interrogation on 10/25 demonstrated no LV capture resulting in LV lead revision 10/25. BiV pacing on telemetry. CXR without pneumothorax. Dr. Klein to interrogate device this morning and dc home if functioning properly.   Signed, HOPE, JESSICA PA-C   Device lead has moved and we had lenghty discussion re doing nothing, endovascular revision or epircardial placement.  She is inclined to the second option and I would agree We will have her come n on Mon at 9 am  For 11  Am procedure Orders written Steven Klein, MD 05/29/2012 9:25 AM      

## 2012-05-29 NOTE — Discharge Summary (Signed)
Discharge Summary   Patient ID: [name] MRN: [id number], DOB/AGE: 76-08-1922 76 y.o.  Primary MD: Thora Lance, MD Primary Cardiologist: Dr. Armanda Magic Primary Electrophysiologist: Dr. Graciela Husbands Admit date: 05/27/2012 D/C date:     05/29/2012      Primary Discharge Diagnoses:  1. Mixed Cardiomyopathy w/ Chronic Systolic CHF and LBBB  - s/p St. Jude CRT-P implant 10/24  - Device interrogation on 10/25 demonstrated no LV capture resulting in LV lead revision 10/25  - Device interrogation on 10/26 demonstrated no LV capture, will return 10/28 for repeat lead revision  Secondary Discharge Diagnoses:  Marland Kitchen Macular degeneration   . Mild mitral regurgitation     Mild TR   . CAD (coronary artery disease)     w/ DES to LAD with residual diagonal disease to small for PCI  . Osteopenia   . LVF (left ventricular failure)     Mild, w/ PASP  . Anginal pain   . Exertional dyspnea     "before stent in 2010" (05/27/2012)  . Vitamin B12 deficiency   . Osteoarthritis     "right knee" (05/27/2012)  . Breast cancer     bilaterally   Allergies No Known Allergies  Diagnostic Studies/Procedures:   05/27/12 - BiV ICD Implantation  St. Jude 829562 cm passive fixation ventricular lead, serial #[id number]  St. Jude 1944 passive fixation atrial lead, serial #[id number]  St. Jude 1258T passive fixation LV lead serial #[id number]  St. Jude C4TR01 pulse generator, serial #[id number]   History of Present Illness: 78 y.o. female w/ the above medical problems who presented [facility] on 05/27/12 for planned CRT-P implantation for NICM w/ chronic systolic CHF and LBBB.  Hospital Course: She arrived at [facility] on 05/27/12 in stable condition. Placement of a St. Jude's BiV ICD was performed. She tolerated the procedure well without complications. Post op CXR was without pneumothorax. Device interrogation the next morning demonstrated no LV lead capture for which she was taken  back to the lab for LV lead revision. She tolerated the procedure well without complications. Post op CXR was without pneumothorax. The following morning device interrogation again demonstrated no LV lead capture. After discussion between Dr. Graciela Husbands and [name] it was determined she would undergo repeat endovascular revision on 10/28.  She was seen and evaluated by Dr. Graciela Husbands who felt she was stable for discharge home with plans to return on 10/28 at 9am for an 11:00 LV lead revision.  Discharge Vitals: Blood pressure 134/54, pulse 71, temperature 98.4 F (36.9 C), temperature source Oral, resp. rate 16, height 5\' 5"  (1.651 m), weight 182 lb 5.1 oz (82.7 kg), SpO2 98.00%.  Labs: None  Discharge Medications     Medication List     As of 05/29/2012 10:17 AM    TAKE these medications         alendronate 70 MG tablet   Commonly known as: FOSAMAX   Take 70 mg by mouth every 7 (seven) days. Take in the morning with a full glass of water, on an empty stomach, and do not take anything else by mouth or lie down for the next 30 min.  On Wednesday      ascorbic acid 500 MG tablet   Commonly known as: VITAMIN C   Take 500 mg by mouth daily.      aspirin 325 MG tablet   Take 325 mg by mouth daily.      carvedilol 3.125 MG tablet  Commonly known as: COREG   Take 3.125 mg by mouth 2 (two) times daily with a meal.      clopidogrel 75 MG tablet   Commonly known as: PLAVIX   Take 75 mg by mouth daily.      furosemide 20 MG tablet   Commonly known as: LASIX   Take 20 mg by mouth daily.      isosorbide mononitrate 60 MG 24 hr tablet   Commonly known as: IMDUR   Take 90 mg by mouth daily.      meloxicam 7.5 MG tablet   Commonly known as: MOBIC   Take 7.5 mg by mouth daily.      nitroGLYCERIN 0.4 MG SL tablet   Commonly known as: NITROSTAT   Place 0.4 mg under the tongue as needed. Chest pain      ramipril 2.5 MG capsule   Commonly known as: ALTACE   Take 2.5 mg by mouth daily.        simvastatin 10 MG tablet   Commonly known as: ZOCOR   Take 10 mg by mouth daily.      traMADol 50 MG tablet   Commonly known as: ULTRAM   Take 100 mg by mouth 2 (two) times daily as needed. for pain      VISION-VITE PRESERVE PO   Take 1 capsule by mouth 2 (two) times daily.      VITAMIN D (CHOLECALCIFEROL) PO   Take 5,000 mg by mouth daily.         Disposition   Discharge Orders    Future Appointments: Provider: Department: Dept Phone: Center:   06/07/2012 4:00 PM [facility] [place] [phone] [facility]   08/31/2012 2:30 PM Duke Salvia, MD [facility] [phone] [facility]     Future Orders Please Complete By Expires   Diet - low sodium heart healthy      Increase activity slowly      Discharge instructions      Comments:   * Please return to [facility] on 05/31/12 at 9:00 am for your heart procedure     Follow-up Information    Follow up with [facility]. On 05/31/2012. (9:00)    Contact information:   [address] [place] [address] [phone]          Outstanding Labs/Studies:  None  Duration of Discharge Encounter: Greater than 30 minutes including physician and PA time.  Signed, Aleks Nawrot PA-C 05/29/2012, 10:17 AM

## 2012-05-29 NOTE — Plan of Care (Signed)
Problem: Discharge Progression Outcomes Goal: Barriers To Progression Addressed/Resolved Patients lead required revision. Planned home Saturday,patient returned to EP lab today for revision

## 2012-05-29 NOTE — Op Note (Signed)
[name], [name] NO.:  [id number]  MEDICAL RECORD NO.:  [id number]  LOCATION:  6527                         FACILITY:  [facility]  PHYSICIAN:  Doylene Canning. Ladona Ridgel, MD    DATE OF BIRTH:  1922/12/20  DATE OF PROCEDURE:  05/28/2012 DATE OF DISCHARGE:                              OPERATIVE REPORT   PROCEDURE PERFORMED:  Left ventricular lead revision.  INDICATION:  Left ventricular lead dysfunction.  INTRODUCTION:  The patient is an 78 year old woman with a history of congestive heart failure and left bundle-branch block who underwent biventricular pacemaker insertion yesterday.  Today, she had no capture in her left ventricular lead.  Chest x-ray demonstrated no obvious migration of the lead.  She is now referred for revision and repositioning of the newly implanted left ventricular lead.  PROCEDURE:  After informed consent was obtained, the patient was taken to the diagnostic EP lab in a fasting state.  After usual preparation and draping, intravenous fentanyl and midazolam was given for sedation. 30 mL of lidocaine was infiltrated over the new pacemaker insertion site and a 6 cm incision was used to open back up the pocket.  The pocket was irrigated.  The left ventricular lead was disconnected from the silk sutures, which were used to secure it.  A 0.014 angioplasty guidewire was then advanced into the LV lead and out into the coronary vein.  The LV lead was retracted and removed without difficulty leaving the angioplasty guidewire in the coronary venous circulation.  The lead was irrigated and placed back in a gentamicin bath.  At this point, a 9.5- [place] hemostatic sheath was advanced over the 0.14 wire and into the central circulation.  The Medtronic MB2 guiding catheter was then advanced over the 0.014 guidewire and into the coronary sinus.  Venography of the coronary sinus was carried out.  This demonstrated a large posterior vein, which had been utilized  for LV placement initially, but which had diaphragmatic stimulation and high thresholds. The lateral vein was then evaluated.  It was cannulated with an 0.14 angioplasty guidewire and the previously utilized St. Jude bipolar LV pacing lead was advanced over the angioplasty guidewire and into the lateral vein.  Pacing from the distal tip of the vein resulted in diaphragmatic stimulation.  Bipolar pacing resulted in diaphragmatic stimulation.  Pacing from the proximal electrode however demonstrated satisfactory pacing thresholds with no diaphragmatic stimulation and it was believed that there were no other options for LV pacing.  At this point, the guiding catheter and the sheath were removed in the usual manner and the lead was secured to the subpectoral fascia with a figure- of-eight silk suture.  The sewing sleeve was secured with silk suture. The pocket was additionally irrigated with antibiotic irrigation.  The other atrial and RV leads were reconnected to the previously implanted Medtronic bipolar generator and the device was placed back in the subcutaneous pocket.  After 2-0 and 3-0 Vicryl were utilized to secure and close the pocket, Benzoin and Steri-Strips were painted on the scan, however, the patient vectors were evaluated fully.  Pacing from the tip resulted in a pacing threshold of 2 V at 0.8 msec,  but diaphragmatic stimulation was present all the way down to 3 V.  Pacing from the proximal ring to the RV ring resulted in no diaphragmatic stimulation, but slightly elevated threshold of 2 V at 0.8 msec and this was the location that we utilized for LV pacing.  The patient was then returned to her room in satisfactory condition.  COMPLICATIONS:  There were no immediate procedure complications.  RESULTS:  This demonstrates successful LV lead revision in a patient who had failure capture 1 day postoperatively.     Doylene Canning. Ladona Ridgel, MD     GWT/MEDQ  D:  05/28/2012  T:   05/29/2012  Job:  [id number]  cc:   Duke Salvia, MD, [facility]

## 2012-05-31 ENCOUNTER — Encounter (HOSPITAL_COMMUNITY)
Admission: RE | Disposition: A | Discharge status: Home / Self Care | Payer: Self-pay | Source: Ambulatory Visit | Attending: Internal Medicine

## 2012-05-31 ENCOUNTER — Ambulatory Visit (HOSPITAL_COMMUNITY)
Admission: RE | Admit: 2012-05-31 | Discharge: 2012-06-01 | Disposition: A | Discharge status: Home / Self Care | Payer: Medicare Other | Source: Ambulatory Visit | Attending: Internal Medicine | Admitting: Internal Medicine

## 2012-05-31 ENCOUNTER — Encounter (HOSPITAL_COMMUNITY): Payer: Self-pay | Admitting: General Practice

## 2012-05-31 DIAGNOSIS — T82190A Other mechanical complication of cardiac electrode, initial encounter: Secondary | ICD-10-CM

## 2012-05-31 DIAGNOSIS — I447 Left bundle-branch block, unspecified: Secondary | ICD-10-CM

## 2012-05-31 DIAGNOSIS — M25559 Pain in unspecified hip: Secondary | ICD-10-CM

## 2012-05-31 DIAGNOSIS — Y831 Surgical operation with implant of artificial internal device as the cause of abnormal reaction of the patient, or of later complication, without mention of misadventure at the time of the procedure: Secondary | ICD-10-CM | POA: Insufficient documentation

## 2012-05-31 DIAGNOSIS — M1990 Unspecified osteoarthritis, unspecified site: Secondary | ICD-10-CM

## 2012-05-31 DIAGNOSIS — M25569 Pain in unspecified knee: Secondary | ICD-10-CM

## 2012-05-31 DIAGNOSIS — M216X9 Other acquired deformities of unspecified foot: Secondary | ICD-10-CM

## 2012-05-31 DIAGNOSIS — T82198A Other mechanical complication of other cardiac electronic device, initial encounter: Secondary | ICD-10-CM | POA: Diagnosis not present

## 2012-05-31 DIAGNOSIS — I255 Ischemic cardiomyopathy: Secondary | ICD-10-CM

## 2012-05-31 DIAGNOSIS — R609 Edema, unspecified: Secondary | ICD-10-CM

## 2012-05-31 DIAGNOSIS — Z950 Presence of cardiac pacemaker: Secondary | ICD-10-CM

## 2012-05-31 DIAGNOSIS — I5022 Chronic systolic (congestive) heart failure: Secondary | ICD-10-CM

## 2012-05-31 DIAGNOSIS — M549 Dorsalgia, unspecified: Secondary | ICD-10-CM

## 2012-05-31 HISTORY — PX: LEAD REVISION: SHX5945

## 2012-05-31 HISTORY — PX: INSERT / REPLACE / REMOVE PACEMAKER: SUR710

## 2012-05-31 SURGERY — LEAD REVISION
Laterality: Left

## 2012-05-31 MED ORDER — SODIUM CHLORIDE 0.9 % IR SOLN
80.0000 mg | Status: DC
  Filled 2012-05-31: qty 2

## 2012-05-31 MED ORDER — CEFAZOLIN SODIUM-DEXTROSE 2-3 GM-% IV SOLR
2.0000 g | INTRAVENOUS | Status: DC
  Filled 2012-05-31 (×2): qty 50

## 2012-05-31 MED ORDER — SODIUM CHLORIDE 0.45 % IV SOLN
INTRAVENOUS | Status: DC
  Administered 2012-05-31: 10:00:00 via INTRAVENOUS

## 2012-05-31 MED ORDER — LIDOCAINE HCL (PF) 1 % IJ SOLN
INTRAMUSCULAR | Status: AC
  Filled 2012-05-31: qty 60

## 2012-05-31 MED ORDER — CARVEDILOL 3.125 MG PO TABS
3.1250 mg | ORAL_TABLET | Freq: Two times a day (BID) | ORAL | Status: DC
Start: 2012-06-01 — End: 2012-06-01
  Administered 2012-06-01: 09:00:00 3.125 mg via ORAL
  Filled 2012-05-31 (×3): qty 1

## 2012-05-31 MED ORDER — SODIUM CHLORIDE 0.9 % IJ SOLN: 3.0000 mL | INTRAMUSCULAR | Status: DC | PRN

## 2012-05-31 MED ORDER — FENTANYL CITRATE 0.05 MG/ML IJ SOLN
INTRAMUSCULAR | Status: AC
  Filled 2012-05-31: qty 2

## 2012-05-31 MED ORDER — NITROGLYCERIN 0.4 MG SL SUBL: 0.4000 mg | SUBLINGUAL_TABLET | SUBLINGUAL | Status: DC | PRN

## 2012-05-31 MED ORDER — ISOSORBIDE MONONITRATE ER 60 MG PO TB24
90.0000 mg | ORAL_TABLET | Freq: Every day | ORAL | Status: DC
  Administered 2012-05-31: 90 mg via ORAL
  Filled 2012-05-31 (×2): qty 1

## 2012-05-31 MED ORDER — CHLORHEXIDINE GLUCONATE 4 % EX LIQD
60.0000 mL | Freq: Once | CUTANEOUS | Status: DC
  Filled 2012-05-31: qty 60

## 2012-05-31 MED ORDER — CLOPIDOGREL BISULFATE 75 MG PO TABS
75.0000 mg | ORAL_TABLET | Freq: Every day | ORAL | Status: DC
Start: 2012-05-31 — End: 2012-06-01
  Administered 2012-06-01: 75 mg via ORAL
  Filled 2012-05-31: qty 1

## 2012-05-31 MED ORDER — RAMIPRIL 2.5 MG PO CAPS
2.5000 mg | ORAL_CAPSULE | Freq: Every day | ORAL | Status: DC
  Filled 2012-05-31 (×2): qty 1

## 2012-05-31 MED ORDER — MIDAZOLAM HCL 5 MG/5ML IJ SOLN
INTRAMUSCULAR | Status: AC
  Filled 2012-05-31: qty 5

## 2012-05-31 MED ORDER — SODIUM CHLORIDE 0.9 % IJ SOLN: 3.0000 mL | Freq: Two times a day (BID) | INTRAMUSCULAR | Status: DC

## 2012-05-31 MED ORDER — SIMVASTATIN 10 MG PO TABS
10.0000 mg | ORAL_TABLET | Freq: Every day | ORAL | Status: DC
Start: 2012-05-31 — End: 2012-06-01
  Administered 2012-05-31: 10 mg via ORAL
  Filled 2012-05-31 (×2): qty 1

## 2012-05-31 MED ORDER — ASPIRIN 325 MG PO TABS
325.0000 mg | ORAL_TABLET | Freq: Every day | ORAL | Status: DC
  Filled 2012-05-31 (×2): qty 1

## 2012-05-31 MED ORDER — HEPARIN (PORCINE) IN NACL 2-0.9 UNIT/ML-% IJ SOLN
INTRAMUSCULAR | Status: AC
  Filled 2012-05-31: qty 500

## 2012-05-31 MED ORDER — SODIUM CHLORIDE 0.9 % IV SOLN
250.0000 mL | INTRAVENOUS | Status: AC
  Administered 2012-05-31: 1000 mL via INTRAVENOUS

## 2012-05-31 MED ORDER — FUROSEMIDE 20 MG PO TABS
20.0000 mg | ORAL_TABLET | Freq: Every day | ORAL | Status: DC
  Filled 2012-05-31 (×2): qty 1

## 2012-05-31 MED ORDER — TRAMADOL HCL 50 MG PO TABS: 100.0000 mg | ORAL_TABLET | Freq: Two times a day (BID) | ORAL | Status: DC | PRN

## 2012-05-31 NOTE — H&P (View-Only) (Signed)
Cardiology Progress Note Patient Name: [name] Date of Encounter: 05/29/2012, 7:54 AM     Subjective  No overnight events. Patient without chest pain or sob. Mild chest soreness at implantation site. Wants to go home.    Objective   Telemetry: BiV pacing 70s  Medications: . aspirin  325 mg Oral Daily  . carvedilol  3.125 mg Oral BID WC  .  ceFAZolin (ANCEF) IV  1 g Intravenous Q6H  . clopidogrel  75 mg Oral Daily  . fentaNYL      . furosemide  20 mg Oral Q breakfast  . isosorbide mononitrate  90 mg Oral Daily  . meloxicam  7.5 mg Oral Daily  . midazolam      . midazolam      . ramipril  2.5 mg Oral Daily  . simvastatin  10 mg Oral q1800  . you have a pacemaker book   Does not apply Once  . DISCONTD:  ceFAZolin (ANCEF) IV  2 g Intravenous On Call  . DISCONTD: chlorhexidine  60 mL Topical Once  . DISCONTD: chlorhexidine  60 mL Topical Once  . DISCONTD: gentamicin irrigation  80 mg Irrigation On Call  . DISCONTD: mupirocin ointment   Nasal BID   . DISCONTD: sodium chloride 50 mL/hr at 05/28/12 0920    Physical Exam: Temp:  [97.5 F (36.4 C)-98.4 F (36.9 C)] 98.4 F (36.9 C) (10/26 0747) Pulse Rate:  [67-81] 71  (10/26 0747) Resp:  [16-24] 16  (10/26 0747) BP: (108-138)/(48-54) 134/54 mmHg (10/26 0747) SpO2:  [93 %-98 %] 98 % (10/26 0747)  General: Pleasant elderly female, in no acute distress. Head: Normocephalic, atraumatic, sclera non-icteric, nares are without discharge.  Neck: Supple. Negative for carotid bruits or JVD Lungs: Clear bilaterally to auscultation without wheezes, rales, or rhonchi. Breathing is unlabored. Chest: Left chest dressing with small amount dried blood.  Heart: RRR S1 S2 without murmurs, rubs, or gallops.  Abdomen: Soft, non-tender, non-distended with normoactive bowel sounds. No rebound/guarding. No obvious abdominal masses. Msk:  Strength and tone appear normal for age. Extremities: Trace BLE edema. No clubbing or cyanosis.  Distal pedal pulses are intact and equal bilaterally. Neuro: Alert and oriented X 3. Moves all extremities spontaneously. Psych:  Responds to questions appropriately with a normal affect.   Intake/Output Summary (Last 24 hours) at 05/29/12 0754 Last data filed at 05/28/12 1700  Gross per 24 hour  Intake    320 ml  Output      0 ml  Net    320 ml    Labs: None  Radiology/Studies:   05/27/12 - BiV ICD Implantation St. Jude 161096 cm passive fixation ventricular lead, serial #[id number] St. Jude [id number] passive fixation atrial lead, serial #[id number] St. Jude 1258T passive fixation LV lead serial #[id number] St. Jude C4TR01 pulse generator, serial #[id number]   05/27/2012 - CHEST - 2 VIEW Findings: The lungs are clear.  Heart size is normal.  No pneumothorax or pleural fluid.  Postoperative change of bilateral mastectomy and axillary dissection is noted.  IMPRESSION: No acute disease.   05/28/2012 - PORTABLE CHEST - 1 VIEW   Findings: A three lead cardiac pacemaker has been placed.  The heart size is normal.  The lung volumes are low.  There is no pneumothorax.  Minimal dependent atelectasis is evident.  Surgical clips are again noted in the axillae bilaterally.  IMPRESSION:  1.  Interval placement of three lead pacemaker without radiographic evidence for  complication. 2.  Low lung volumes and minimal bibasilar atelectasis   05/29/2012 - CHEST - 2 VIEW Findings: Three lead cardiac pacemaker.  Shallow inspiration. Normal heart size and pulmonary vascularity.  Tortuous aorta. Infiltration or atelectasis in the left lung base with mild blunting of left costophrenic angle.  Lucencies in the apices likely represent emphysematous change.  No pneumothorax.  Surgical clips in the axillary regions.  IMPRESSION: Cardiac pacemaker.  Shallow inspiration with infiltration or atelectasis in the left lung base.      Assessment and Plan   NICM w/ Chronic Systolic CHF and LBBB: s/p St. Jude CRT-P implant  10/24. Device interrogation on 10/25 demonstrated no LV capture resulting in LV lead revision 10/25. BiV pacing on telemetry. CXR without pneumothorax. Dr. Graciela Husbands to interrogate device this morning and dc home if functioning properly.   Signed, HOPE, JESSICA PA-C   Device lead has moved and we had lenghty discussion re doing nothing, endovascular revision or epircardial placement.  She is inclined to the second option and I would agree We will have her come n on Mon at 9 am  For 11  Am procedure Orders written Sherryl Manges, MD 05/29/2012 9:25 AM

## 2012-05-31 NOTE — Interval H&P Note (Signed)
History and Physical Interval Note:  05/31/2012 12:30 PM  [name]  has presented today for surgery, with the diagnosis of lead dislodged  The various methods of treatment have been discussed with the patient and family. After consideration of risks, benefits and other options for treatment, the patient has consented to  Procedure(s) (LRB) with comments: PACEMAKER REVISION (N/A) as a surgical intervention .  The patient's history has been reviewed, patient examined, no change in status, stable for surgery.  I have reviewed the patient's chart and labs.  Questions were answered to the patient's satisfaction.     Sherryl Manges  Will try a 3rd time to get the lead somewhere where it will stay  [place] [facility] [place]

## 2012-06-01 ENCOUNTER — Encounter (HOSPITAL_COMMUNITY): Payer: Self-pay

## 2012-06-01 ENCOUNTER — Ambulatory Visit (HOSPITAL_COMMUNITY): Payer: Medicare Other

## 2012-06-01 ENCOUNTER — Other Ambulatory Visit: Payer: Self-pay

## 2012-06-01 DIAGNOSIS — M1990 Unspecified osteoarthritis, unspecified site: Secondary | ICD-10-CM | POA: Diagnosis not present

## 2012-06-01 DIAGNOSIS — T82198A Other mechanical complication of other cardiac electronic device, initial encounter: Secondary | ICD-10-CM | POA: Diagnosis not present

## 2012-06-01 DIAGNOSIS — Z950 Presence of cardiac pacemaker: Secondary | ICD-10-CM | POA: Diagnosis not present

## 2012-06-01 NOTE — Op Note (Signed)
NAMELARK, LANGENFELD NO.:  [id number]  MEDICAL RECORD NO.:  [id number]  LOCATION:  6529                         FACILITY:  [facility]  PHYSICIAN:  Duke Salvia, MD, FACCDATE OF BIRTH:  April 26, 1923  DATE OF PROCEDURE:  05/31/2012 DATE OF DISCHARGE:                              OPERATIVE REPORT   PREOPERATIVE DIAGNOSIS:  Previously implanted CRT P with macro lead dislodgement.  POSTOPERATIVE DIAGNOSIS:  Previously implanted CRT P with macro lead dislodgement.  PROCEDURE:  Left ventricular lead repositioning.  DESCRIPTION OF PROCEDURE:  After obtaining informed consent, the patient was brought to the electrophysiology lab and placed on the fluoroscopic table in supine position.  After routine prep and drape of the left upper chest, lidocaine was infiltrated along the line of the previous incision and incision was opened and carried down to the layer of device pocket without dissection.  Care was taken to remove all suture fragments.  The device was then freed up and removed from the pocket. The leads were freed up from the device and put in the pocket and antibiotic containing saline pad was placed into the pocket.  The left ventricular lead was freed up.  A Mailman wire was placed through the lead, which allowed for maintenance of the axis of the coronary sinus. An MB 2 coronary sinus cannulation catheter was placed over this lead. There was selective engagement of the major lateral branch and so this vein was mapped extensively.  We used a 4396 lead after we were able to find the anteriorly directed subbranch previously identified.  However, the 4396 Medtronic lead failed to be able to pace along this vein in any terminal ramification.  We then explored a high lateral branch that had been previously utilized by Dr. Ladona Ridgel.  We placed this 4396 lead into good position.  However, he had no pacing configuration to capture the ventricle adequately without  diaphragmatic stimulation.  This vein was then abandoned.  We then explored the anterior veins.  There was a high anterolateral branch and anterior branch, both of these were mapped. The anterolateral branch had no ventricular pacing.  The anterior branch had very little ventricular pacing and when the RV/LV electrogram was mapped, it was approximately 10 or 20 msec.  This vein was then abandoned.  We then undertook a strategy with a different lead in the main lateral branch.  We put a 4194 Medtronic lead, serial number [id number].  With this lead, we were able to find adequate pacing in a LV ring, RV ring and LV ring, LV tip configuration.  This lead had the benefit of being able to hopefully be secured to the long branch of the vein given the large hand.  In this location, the bipolar L wave was 14.4, the pace impedance of 411, and unipolar configuration was 3.6 V at 1.0 msec, ultimately bipolar was 2 V at 1 msec.  This lead was then secured to the prepectoral fascia following the removal of the deployment system.  The leads were then attached back to the previously utilized Medtronic CTR01 pulse generator, serial number [id number]. The previously implanted RV lead and atrial leads were  reconnected.  An Aegis antimicrobial pouch was placed in the pocket.  The leads with the pulse generator placed in the pocket, secured to the prepectoral fascia, and the Aegis pouch was also secured.  The wound was then closed in 2 layers in normal fashion.  The wound was washed, dried, and a benzoin and Steri- Strip dressing was applied.  Needle counts, sponge counts, and instrument counts were correct at the end of the procedure according to the staff.  The patient tolerated the procedure with only modest discomfort.     Duke Salvia, MD, [facility]     SCK/MEDQ  D:  05/31/2012  T:  06/01/2012  Job:  [id number]

## 2012-06-01 NOTE — Progress Notes (Signed)
   ELECTROPHYSIOLOGY ROUNDING NOTE    Patient Name: [name] Date of Encounter: 06-01-2012    SUBJECTIVE:Patient feels well.  No chest pain or shortness of breath.  Optimistic that the "3rd time was the charm".  S/p LV lead revision 05-31-2012  TELEMETRY: Reviewed telemetry pt in sinus rhythm with CRT pacing Filed Vitals:   05/31/12 1800 05/31/12 2027 05/31/12 2350 06/01/12 0305  BP: 94/35 111/39    Pulse:  83    Temp:  97.5 F (36.4 C) 98.4 F (36.9 C)   TempSrc:  Axillary    Resp: 30 19    Height:      Weight:    175 lb 4.3 oz (79.5 kg)  SpO2:  93% 94% 95%    Intake/Output Summary (Last 24 hours) at 06/01/12 0731 Last data filed at 05/31/12 2213  Gross per 24 hour  Intake      0 ml  Output    150 ml  Net   -150 ml     Radiology/Studies:  Final result pending, leads in stable position.  PHYSICAL EXAM Left chest without hematoma or ecchymosis. Well developed and nourished in no acute distress HENT normal Neck supple   Clear Regular rate and rhythm, no murmurs or gallops Abd-soft with active BS without hepatomegaly No Clubbing cyanosis edema Skin-warm and dry A & Oriented  Grossly normal sensory and motor function   DEVICE INTERROGATION: Device interrogated by industry.  Lead values including impedence, sensing, threshold within normal values.    Wound care, restrictions, arm mobility reviewed with patient.  Routine follow up scheduled.

## 2012-06-07 ENCOUNTER — Ambulatory Visit: Payer: Medicare Other

## 2012-06-09 ENCOUNTER — Ambulatory Visit (INDEPENDENT_AMBULATORY_CARE_PROVIDER_SITE_OTHER): Payer: Medicare Other | Admitting: *Deleted

## 2012-06-09 DIAGNOSIS — I5022 Chronic systolic (congestive) heart failure: Secondary | ICD-10-CM

## 2012-06-09 DIAGNOSIS — I2589 Other forms of chronic ischemic heart disease: Secondary | ICD-10-CM | POA: Diagnosis not present

## 2012-06-09 DIAGNOSIS — I509 Heart failure, unspecified: Secondary | ICD-10-CM | POA: Diagnosis not present

## 2012-06-09 DIAGNOSIS — I255 Ischemic cardiomyopathy: Secondary | ICD-10-CM

## 2012-06-09 LAB — PACEMAKER DEVICE OBSERVATION
AL AMPLITUDE: 2.125 mv
AL IMPEDENCE PM: 380 Ohm
AL THRESHOLD: 1 V
ATRIAL PACING PM: 0.02
BAMS-0001: 170 {beats}/min
BATTERY VOLTAGE: 3.0269 V
LV LEAD THRESHOLD: 0.75 V
RV LEAD AMPLITUDE: 9.25 mv
RV LEAD IMPEDENCE PM: 855 Ohm
RV LEAD THRESHOLD: 0.75 V
VENTRICULAR PACING PM: 99.58

## 2012-06-09 NOTE — Progress Notes (Signed)
Wound check-PPM 

## 2012-06-09 NOTE — Patient Instructions (Addendum)
May resume physical therapy with the restriction of no lifting more than 10lbs with the left arm.  Patient may continue with activities as tolerated.

## 2012-06-14 DIAGNOSIS — E538 Deficiency of other specified B group vitamins: Secondary | ICD-10-CM | POA: Diagnosis not present

## 2012-06-16 DIAGNOSIS — R279 Unspecified lack of coordination: Secondary | ICD-10-CM | POA: Diagnosis not present

## 2012-06-16 DIAGNOSIS — M6281 Muscle weakness (generalized): Secondary | ICD-10-CM | POA: Diagnosis not present

## 2012-06-16 DIAGNOSIS — R269 Unspecified abnormalities of gait and mobility: Secondary | ICD-10-CM | POA: Diagnosis not present

## 2012-06-16 DIAGNOSIS — M1710 Unilateral primary osteoarthritis, unspecified knee: Secondary | ICD-10-CM | POA: Diagnosis not present

## 2012-06-18 DIAGNOSIS — R269 Unspecified abnormalities of gait and mobility: Secondary | ICD-10-CM | POA: Diagnosis not present

## 2012-06-18 DIAGNOSIS — M6281 Muscle weakness (generalized): Secondary | ICD-10-CM | POA: Diagnosis not present

## 2012-06-18 DIAGNOSIS — M1710 Unilateral primary osteoarthritis, unspecified knee: Secondary | ICD-10-CM | POA: Diagnosis not present

## 2012-06-18 DIAGNOSIS — R279 Unspecified lack of coordination: Secondary | ICD-10-CM | POA: Diagnosis not present

## 2012-06-21 DIAGNOSIS — M1710 Unilateral primary osteoarthritis, unspecified knee: Secondary | ICD-10-CM | POA: Diagnosis not present

## 2012-06-21 DIAGNOSIS — R279 Unspecified lack of coordination: Secondary | ICD-10-CM | POA: Diagnosis not present

## 2012-06-21 DIAGNOSIS — R269 Unspecified abnormalities of gait and mobility: Secondary | ICD-10-CM | POA: Diagnosis not present

## 2012-06-21 DIAGNOSIS — M6281 Muscle weakness (generalized): Secondary | ICD-10-CM | POA: Diagnosis not present

## 2012-06-25 DIAGNOSIS — M1710 Unilateral primary osteoarthritis, unspecified knee: Secondary | ICD-10-CM | POA: Diagnosis not present

## 2012-06-25 DIAGNOSIS — M6281 Muscle weakness (generalized): Secondary | ICD-10-CM | POA: Diagnosis not present

## 2012-06-25 DIAGNOSIS — R279 Unspecified lack of coordination: Secondary | ICD-10-CM | POA: Diagnosis not present

## 2012-06-25 DIAGNOSIS — R269 Unspecified abnormalities of gait and mobility: Secondary | ICD-10-CM | POA: Diagnosis not present

## 2012-07-06 DIAGNOSIS — R269 Unspecified abnormalities of gait and mobility: Secondary | ICD-10-CM | POA: Diagnosis not present

## 2012-07-06 DIAGNOSIS — M1710 Unilateral primary osteoarthritis, unspecified knee: Secondary | ICD-10-CM | POA: Diagnosis not present

## 2012-07-06 DIAGNOSIS — M6281 Muscle weakness (generalized): Secondary | ICD-10-CM | POA: Diagnosis not present

## 2012-07-06 DIAGNOSIS — R279 Unspecified lack of coordination: Secondary | ICD-10-CM | POA: Diagnosis not present

## 2012-07-08 DIAGNOSIS — R279 Unspecified lack of coordination: Secondary | ICD-10-CM | POA: Diagnosis not present

## 2012-07-08 DIAGNOSIS — M6281 Muscle weakness (generalized): Secondary | ICD-10-CM | POA: Diagnosis not present

## 2012-07-08 DIAGNOSIS — M1710 Unilateral primary osteoarthritis, unspecified knee: Secondary | ICD-10-CM | POA: Diagnosis not present

## 2012-07-08 DIAGNOSIS — R269 Unspecified abnormalities of gait and mobility: Secondary | ICD-10-CM | POA: Diagnosis not present

## 2012-07-12 ENCOUNTER — Encounter: Payer: Self-pay | Admitting: Internal Medicine

## 2012-07-12 DIAGNOSIS — R269 Unspecified abnormalities of gait and mobility: Secondary | ICD-10-CM | POA: Diagnosis not present

## 2012-07-12 DIAGNOSIS — E538 Deficiency of other specified B group vitamins: Secondary | ICD-10-CM | POA: Diagnosis not present

## 2012-07-12 DIAGNOSIS — M1710 Unilateral primary osteoarthritis, unspecified knee: Secondary | ICD-10-CM | POA: Diagnosis not present

## 2012-07-12 DIAGNOSIS — R279 Unspecified lack of coordination: Secondary | ICD-10-CM | POA: Diagnosis not present

## 2012-07-12 DIAGNOSIS — M6281 Muscle weakness (generalized): Secondary | ICD-10-CM | POA: Diagnosis not present

## 2012-07-14 DIAGNOSIS — M1710 Unilateral primary osteoarthritis, unspecified knee: Secondary | ICD-10-CM | POA: Diagnosis not present

## 2012-07-16 DIAGNOSIS — R269 Unspecified abnormalities of gait and mobility: Secondary | ICD-10-CM | POA: Diagnosis not present

## 2012-07-16 DIAGNOSIS — R279 Unspecified lack of coordination: Secondary | ICD-10-CM | POA: Diagnosis not present

## 2012-07-16 DIAGNOSIS — M1710 Unilateral primary osteoarthritis, unspecified knee: Secondary | ICD-10-CM | POA: Diagnosis not present

## 2012-07-16 DIAGNOSIS — M6281 Muscle weakness (generalized): Secondary | ICD-10-CM | POA: Diagnosis not present

## 2012-07-20 DIAGNOSIS — M1710 Unilateral primary osteoarthritis, unspecified knee: Secondary | ICD-10-CM | POA: Diagnosis not present

## 2012-07-20 DIAGNOSIS — R269 Unspecified abnormalities of gait and mobility: Secondary | ICD-10-CM | POA: Diagnosis not present

## 2012-07-20 DIAGNOSIS — M6281 Muscle weakness (generalized): Secondary | ICD-10-CM | POA: Diagnosis not present

## 2012-07-20 DIAGNOSIS — R279 Unspecified lack of coordination: Secondary | ICD-10-CM | POA: Diagnosis not present

## 2012-07-21 DIAGNOSIS — M1710 Unilateral primary osteoarthritis, unspecified knee: Secondary | ICD-10-CM | POA: Diagnosis not present

## 2012-08-02 DIAGNOSIS — I2510 Atherosclerotic heart disease of native coronary artery without angina pectoris: Secondary | ICD-10-CM | POA: Diagnosis not present

## 2012-08-02 DIAGNOSIS — E7800 Pure hypercholesterolemia, unspecified: Secondary | ICD-10-CM | POA: Diagnosis not present

## 2012-08-02 DIAGNOSIS — I2589 Other forms of chronic ischemic heart disease: Secondary | ICD-10-CM | POA: Diagnosis not present

## 2012-08-02 DIAGNOSIS — I059 Rheumatic mitral valve disease, unspecified: Secondary | ICD-10-CM | POA: Diagnosis not present

## 2012-08-06 DIAGNOSIS — M1710 Unilateral primary osteoarthritis, unspecified knee: Secondary | ICD-10-CM | POA: Diagnosis not present

## 2012-08-11 DIAGNOSIS — M1710 Unilateral primary osteoarthritis, unspecified knee: Secondary | ICD-10-CM | POA: Diagnosis not present

## 2012-08-16 DIAGNOSIS — E538 Deficiency of other specified B group vitamins: Secondary | ICD-10-CM | POA: Diagnosis not present

## 2012-08-17 DIAGNOSIS — M1710 Unilateral primary osteoarthritis, unspecified knee: Secondary | ICD-10-CM | POA: Diagnosis not present

## 2012-08-31 ENCOUNTER — Encounter: Payer: Medicare Other | Admitting: Internal Medicine

## 2012-09-15 ENCOUNTER — Encounter: Payer: Self-pay | Admitting: Internal Medicine

## 2012-09-15 ENCOUNTER — Ambulatory Visit (INDEPENDENT_AMBULATORY_CARE_PROVIDER_SITE_OTHER): Payer: Medicare Other | Admitting: Internal Medicine

## 2012-09-15 VITALS — BP 148/78 | HR 84 | Ht 65.0 in | Wt 180.8 lb

## 2012-09-15 DIAGNOSIS — I255 Ischemic cardiomyopathy: Secondary | ICD-10-CM

## 2012-09-15 DIAGNOSIS — I2589 Other forms of chronic ischemic heart disease: Secondary | ICD-10-CM | POA: Diagnosis not present

## 2012-09-15 DIAGNOSIS — I509 Heart failure, unspecified: Secondary | ICD-10-CM | POA: Diagnosis not present

## 2012-09-15 DIAGNOSIS — Z950 Presence of cardiac pacemaker: Secondary | ICD-10-CM

## 2012-09-15 DIAGNOSIS — I5022 Chronic systolic (congestive) heart failure: Secondary | ICD-10-CM | POA: Diagnosis not present

## 2012-09-15 MED ORDER — ASPIRIN 81 MG PO TABS: 325.0000 mg | ORAL_TABLET | Freq: Every day | ORAL | Status: DC

## 2012-09-15 NOTE — Assessment & Plan Note (Signed)
The patient's device was interrogated and the information was fully reviewed.  The device was reprogrammed to maximize longevity with significant improvement with decrease in left ventricular pacing outputs as there been a marked improvement in LV threshold

## 2012-09-15 NOTE — Patient Instructions (Signed)
Decrease Aspirin to 81 mg daily.  Your physician wants you to follow-up in: October with Dr Graciela Husbands.  You will receive a reminder letter in the mail two months in advance. If you don't receive a letter, please call our office to schedule the follow-up appointment.

## 2012-09-15 NOTE — Assessment & Plan Note (Signed)
Stable. We will decrease her aspirin dose from 325-81

## 2012-09-15 NOTE — Progress Notes (Signed)
Patient Care Team: Thora Lance, MD as PCP - General (Family Medicine)   HPI  [name] is a 79 y.o. female Seen in followup for congestive heart failure in the setting of ischemic and ischemic heart disease status post CRT D. Implantation complicated by lead dislodgment X3 She thinks that she is functionally improved since the CRT implantation. Her edema wall chronic is less. Her dyspnea is also better.     Past Medical History  Diagnosis Date  . Macular degeneration   . Mild mitral regurgitation     Mild TR   . CAD (coronary artery disease)     w/ DES to LAD with residual diagonal disease to small for PCI  . Osteopenia   . LVF (left ventricular failure)     Mild, w/ PASP  . Anginal pain   . Exertional dyspnea     "before stent in 2010" (05/27/2012)  . Vitamin B12 deficiency   . Osteoarthritis     "right knee" (05/27/2012)  . Breast cancer     bilaterally  . Cardiomyopathy     Mixed CM; s/p St. Jude CRT-P implant 10/24 w/ LV lead revision 10/25   . LBBB (left bundle branch block)   . Chronic systolic CHF (congestive heart failure)     Past Surgical History  Procedure Laterality Date  . Coronary angioplasty with stent placement  2010    "1" (05/27/2012)  . Total hip arthroplasty  2011    right  . Mastectomy  1995    bilaterally  . Breast biopsy  1995    bilaterally  . Cataract extraction w/ intraocular lens  implant, bilateral  1990's  . Insert / replace / remove pacemaker  05/27/2012    initial placement  . Insert / replace / remove pacemaker  05/31/2012    "lead change/repaired" (05/31/2012)    Current Outpatient Prescriptions  Medication Sig Dispense Refill  . alendronate (FOSAMAX) 70 MG tablet Take 70 mg by mouth every 7 (seven) days. Take in the morning with a full glass of water, on an empty stomach, and do not take anything else by mouth or lie down for the next 30 min. On Wednesday      . ascorbic acid (VITAMIN C) 500 MG tablet Take 500  mg by mouth daily.      Marland Kitchen aspirin 325 MG tablet Take 325 mg by mouth daily.      . carvedilol (COREG) 3.125 MG tablet Take 3.125 mg by mouth 2 (two) times daily with a meal.      . clopidogrel (PLAVIX) 75 MG tablet Take 75 mg by mouth daily.        . furosemide (LASIX) 20 MG tablet Take 20 mg by mouth daily.      . isosorbide mononitrate (IMDUR) 60 MG 24 hr tablet Take 90 mg by mouth daily.       . meloxicam (MOBIC) 7.5 MG tablet Take 7.5 mg by mouth daily.      . Multiple Vitamins-Minerals (VISION-VITE PRESERVE PO) Take 1 capsule by mouth 2 (two) times daily.       . nitroGLYCERIN (NITROSTAT) 0.4 MG SL tablet Place 0.4 mg under the tongue as needed. Chest pain      . ramipril (ALTACE) 2.5 MG capsule Take 2.5 mg by mouth daily.      . simvastatin (ZOCOR) 10 MG tablet Take 10 mg by mouth daily.      . traMADol (ULTRAM) 50 MG tablet Take 100  mg by mouth 2 (two) times daily as needed. for pain      . VITAMIN D, CHOLECALCIFEROL, PO Take 5,000 mg by mouth daily.       No current facility-administered medications for this visit.    No Known Allergies  Review of Systems negative except from HPI and PMH  Physical Exam BP 148/78  Pulse 84  Ht 5\' 5"  (1.651 m)  Wt 180 lb 12.8 oz (82.01 kg)  BMI 30.09 kg/m2 Well developed and well nourished in no acute distress HENT normal E scleral and icterus clear Neck Supple JVP flat; carotids brisk and full Clear to ausculation Device pocket well healed; without hematoma or erythema   Regular rate and rhythm, no murmurs gallops or rub Soft with active bowel sounds No clubbing cyanosis 2+ and non-pitting Edema Alert and oriented, grossly normal motor and sensory function Skin Warm and Dry  ecg  P-synchronous/ AV  pacing   Assessment and  Plan

## 2012-09-15 NOTE — Assessment & Plan Note (Signed)
Improved to CRT device implantation. The wound is well-healed not withstanding the multiple procedures

## 2012-09-17 LAB — PACEMAKER DEVICE OBSERVATION
AL AMPLITUDE: 2.4 mv
AL IMPEDENCE PM: 475 Ohm
AL THRESHOLD: 0.625 v
ATRIAL PACING PM: 0.1
BAMS-0001: 170 {beats}/min
BATTERY VOLTAGE: 2.99 v
LV LEAD IMPEDENCE PM: 589 Ohm
LV LEAD THRESHOLD: 1 v
RV LEAD AMPLITUDE: 9.4 mv
RV LEAD IMPEDENCE PM: 893 Ohm
RV LEAD THRESHOLD: 0.5 v
VENTRICULAR PACING PM: 99.8

## 2012-09-23 DIAGNOSIS — M949 Disorder of cartilage, unspecified: Secondary | ICD-10-CM | POA: Diagnosis not present

## 2012-09-23 DIAGNOSIS — M1990 Unspecified osteoarthritis, unspecified site: Secondary | ICD-10-CM | POA: Diagnosis not present

## 2012-09-23 DIAGNOSIS — K429 Umbilical hernia without obstruction or gangrene: Secondary | ICD-10-CM | POA: Diagnosis not present

## 2012-09-23 DIAGNOSIS — R300 Dysuria: Secondary | ICD-10-CM | POA: Diagnosis not present

## 2012-09-23 DIAGNOSIS — M899 Disorder of bone, unspecified: Secondary | ICD-10-CM | POA: Diagnosis not present

## 2012-09-23 DIAGNOSIS — E538 Deficiency of other specified B group vitamins: Secondary | ICD-10-CM | POA: Diagnosis not present

## 2012-09-24 DIAGNOSIS — R279 Unspecified lack of coordination: Secondary | ICD-10-CM | POA: Diagnosis not present

## 2012-09-24 DIAGNOSIS — R269 Unspecified abnormalities of gait and mobility: Secondary | ICD-10-CM | POA: Diagnosis not present

## 2012-09-24 DIAGNOSIS — M6281 Muscle weakness (generalized): Secondary | ICD-10-CM | POA: Diagnosis not present

## 2012-09-24 DIAGNOSIS — IMO0002 Reserved for concepts with insufficient information to code with codable children: Secondary | ICD-10-CM | POA: Diagnosis not present

## 2012-09-24 DIAGNOSIS — M1710 Unilateral primary osteoarthritis, unspecified knee: Secondary | ICD-10-CM | POA: Diagnosis not present

## 2012-09-27 DIAGNOSIS — R279 Unspecified lack of coordination: Secondary | ICD-10-CM | POA: Diagnosis not present

## 2012-09-27 DIAGNOSIS — IMO0002 Reserved for concepts with insufficient information to code with codable children: Secondary | ICD-10-CM | POA: Diagnosis not present

## 2012-09-27 DIAGNOSIS — M6281 Muscle weakness (generalized): Secondary | ICD-10-CM | POA: Diagnosis not present

## 2012-09-27 DIAGNOSIS — M1710 Unilateral primary osteoarthritis, unspecified knee: Secondary | ICD-10-CM | POA: Diagnosis not present

## 2012-09-27 DIAGNOSIS — R269 Unspecified abnormalities of gait and mobility: Secondary | ICD-10-CM | POA: Diagnosis not present

## 2012-09-28 ENCOUNTER — Encounter (INDEPENDENT_AMBULATORY_CARE_PROVIDER_SITE_OTHER): Payer: Self-pay

## 2012-09-29 DIAGNOSIS — R269 Unspecified abnormalities of gait and mobility: Secondary | ICD-10-CM | POA: Diagnosis not present

## 2012-09-29 DIAGNOSIS — M6281 Muscle weakness (generalized): Secondary | ICD-10-CM | POA: Diagnosis not present

## 2012-09-29 DIAGNOSIS — R279 Unspecified lack of coordination: Secondary | ICD-10-CM | POA: Diagnosis not present

## 2012-09-29 DIAGNOSIS — IMO0002 Reserved for concepts with insufficient information to code with codable children: Secondary | ICD-10-CM | POA: Diagnosis not present

## 2012-09-29 DIAGNOSIS — M1710 Unilateral primary osteoarthritis, unspecified knee: Secondary | ICD-10-CM | POA: Diagnosis not present

## 2012-10-06 DIAGNOSIS — R279 Unspecified lack of coordination: Secondary | ICD-10-CM | POA: Diagnosis not present

## 2012-10-06 DIAGNOSIS — M1710 Unilateral primary osteoarthritis, unspecified knee: Secondary | ICD-10-CM | POA: Diagnosis not present

## 2012-10-06 DIAGNOSIS — M6281 Muscle weakness (generalized): Secondary | ICD-10-CM | POA: Diagnosis not present

## 2012-10-06 DIAGNOSIS — R269 Unspecified abnormalities of gait and mobility: Secondary | ICD-10-CM | POA: Diagnosis not present

## 2012-10-06 DIAGNOSIS — IMO0002 Reserved for concepts with insufficient information to code with codable children: Secondary | ICD-10-CM | POA: Diagnosis not present

## 2012-10-12 ENCOUNTER — Ambulatory Visit (INDEPENDENT_AMBULATORY_CARE_PROVIDER_SITE_OTHER): Payer: PRIVATE HEALTH INSURANCE | Admitting: Surgery

## 2012-10-13 DIAGNOSIS — M1710 Unilateral primary osteoarthritis, unspecified knee: Secondary | ICD-10-CM | POA: Diagnosis not present

## 2012-10-13 DIAGNOSIS — R279 Unspecified lack of coordination: Secondary | ICD-10-CM | POA: Diagnosis not present

## 2012-10-13 DIAGNOSIS — M6281 Muscle weakness (generalized): Secondary | ICD-10-CM | POA: Diagnosis not present

## 2012-10-13 DIAGNOSIS — IMO0002 Reserved for concepts with insufficient information to code with codable children: Secondary | ICD-10-CM | POA: Diagnosis not present

## 2012-10-13 DIAGNOSIS — R269 Unspecified abnormalities of gait and mobility: Secondary | ICD-10-CM | POA: Diagnosis not present

## 2012-10-14 DIAGNOSIS — H31019 Macula scars of posterior pole (postinflammatory) (post-traumatic), unspecified eye: Secondary | ICD-10-CM | POA: Diagnosis not present

## 2012-10-14 DIAGNOSIS — H353190 Nonexudative age-related macular degeneration, unspecified eye, stage unspecified: Secondary | ICD-10-CM | POA: Diagnosis not present

## 2012-10-14 DIAGNOSIS — Z961 Presence of intraocular lens: Secondary | ICD-10-CM | POA: Diagnosis not present

## 2012-10-14 DIAGNOSIS — H353290 Exudative age-related macular degeneration, unspecified eye, stage unspecified: Secondary | ICD-10-CM | POA: Diagnosis not present

## 2012-10-14 DIAGNOSIS — H3530 Unspecified macular degeneration: Secondary | ICD-10-CM | POA: Diagnosis not present

## 2012-10-15 DIAGNOSIS — M1710 Unilateral primary osteoarthritis, unspecified knee: Secondary | ICD-10-CM | POA: Diagnosis not present

## 2012-10-15 DIAGNOSIS — M6281 Muscle weakness (generalized): Secondary | ICD-10-CM | POA: Diagnosis not present

## 2012-10-15 DIAGNOSIS — R279 Unspecified lack of coordination: Secondary | ICD-10-CM | POA: Diagnosis not present

## 2012-10-15 DIAGNOSIS — R269 Unspecified abnormalities of gait and mobility: Secondary | ICD-10-CM | POA: Diagnosis not present

## 2012-10-15 DIAGNOSIS — IMO0002 Reserved for concepts with insufficient information to code with codable children: Secondary | ICD-10-CM | POA: Diagnosis not present

## 2012-10-18 DIAGNOSIS — IMO0002 Reserved for concepts with insufficient information to code with codable children: Secondary | ICD-10-CM | POA: Diagnosis not present

## 2012-10-18 DIAGNOSIS — R269 Unspecified abnormalities of gait and mobility: Secondary | ICD-10-CM | POA: Diagnosis not present

## 2012-10-18 DIAGNOSIS — M1710 Unilateral primary osteoarthritis, unspecified knee: Secondary | ICD-10-CM | POA: Diagnosis not present

## 2012-10-18 DIAGNOSIS — M6281 Muscle weakness (generalized): Secondary | ICD-10-CM | POA: Diagnosis not present

## 2012-10-18 DIAGNOSIS — R279 Unspecified lack of coordination: Secondary | ICD-10-CM | POA: Diagnosis not present

## 2012-10-20 DIAGNOSIS — E538 Deficiency of other specified B group vitamins: Secondary | ICD-10-CM | POA: Diagnosis not present

## 2012-10-22 ENCOUNTER — Ambulatory Visit (INDEPENDENT_AMBULATORY_CARE_PROVIDER_SITE_OTHER): Payer: Medicare Other | Admitting: Surgery

## 2012-10-22 ENCOUNTER — Encounter (INDEPENDENT_AMBULATORY_CARE_PROVIDER_SITE_OTHER): Payer: Self-pay | Admitting: Surgery

## 2012-10-22 VITALS — BP 156/94 | HR 68 | Temp 97.6°F | Resp 14 | Ht 65.0 in | Wt 190.4 lb

## 2012-10-22 DIAGNOSIS — K429 Umbilical hernia without obstruction or gangrene: Secondary | ICD-10-CM | POA: Diagnosis not present

## 2012-10-22 NOTE — Progress Notes (Signed)
Patient ID: [name], female   DOB: Feb 20, 1923, 79 y.o.   MRN: [id number]  Chief Complaint  Patient presents with  . Umbilical Hernia    new pt    HPI [name] is a 79 y.o. female who presents with an umbilical hernia. She first noticed it 18 mo ago. It started small and has progressed to a much larger 'bulge'. She denies tenderness to touch or pain. She denies D, constipation, blood in her stools. She denies pain with BM. She has not had prior abdominal surgeries. She wishes to have the hernia repaired due to unsightly appearance.    HPI  Past Medical History  Diagnosis Date  . Macular degeneration   . Mild mitral regurgitation     Mild TR   . CAD (coronary artery disease)     w/ DES to LAD with residual diagonal disease to small for PCI  . Osteopenia   . LVF (left ventricular failure)     Mild, w/ PASP  . Anginal pain   . Exertional dyspnea     "before stent in 2010" (05/27/2012)  . Vitamin B12 deficiency   . Osteoarthritis     "right knee" (05/27/2012)  . Breast cancer     bilaterally  . Cardiomyopathy     Mixed CM; s/p St. Jude CRT-P implant 10/24 w/ LV lead revision 10/25   . LBBB (left bundle branch block)   . Chronic systolic CHF (congestive heart failure)     Past Surgical History  Procedure Laterality Date  . Coronary angioplasty with stent placement  2010    "1" (05/27/2012)  . Total hip arthroplasty  2011    right  . Mastectomy  1995    bilaterally  . Breast biopsy  1995    bilaterally  . Cataract extraction w/ intraocular lens  implant, bilateral  1990's  . Insert / replace / remove pacemaker  05/27/2012    initial placement  . Insert / replace / remove pacemaker  05/31/2012    "lead change/repaired" (05/31/2012)    Family History  Problem Relation Age of Onset  . Heart disease Mother   . Heart disease Father     Social History History  Substance Use Topics  . Smoking status: Former Smoker -- 1.00 packs/day for 27 years   Types: Cigarettes  . Smokeless tobacco: Never Used     Comment: 05/27/2012 "stopped smoking ~ 46 yr ago"  . Alcohol Use: 3.0 oz/week    5 Glasses of wine per week     Comment: 05/27/2012 "glass of wine ~ 5X/wk":    No Known Allergies  Current Outpatient Prescriptions  Medication Sig Dispense Refill  . alendronate (FOSAMAX) 70 MG tablet Take 70 mg by mouth every 7 (seven) days. Take in the morning with a full glass of water, on an empty stomach, and do not take anything else by mouth or lie down for the next 30 min. On Wednesday      . ascorbic acid (VITAMIN C) 500 MG tablet Take 500 mg by mouth daily.      Marland Kitchen aspirin 81 MG tablet Take 4 tablets (325 mg total) by mouth daily.  30 tablet  0  . carvedilol (COREG) 3.125 MG tablet Take 3.125 mg by mouth 2 (two) times daily with a meal.      . clopidogrel (PLAVIX) 75 MG tablet Take 75 mg by mouth daily.        . furosemide (LASIX) 20 MG tablet  Take 20 mg by mouth daily.      . isosorbide mononitrate (IMDUR) 60 MG 24 hr tablet Take 90 mg by mouth daily.       . meloxicam (MOBIC) 7.5 MG tablet Take 7.5 mg by mouth daily.      . Multiple Vitamins-Minerals (VISION-VITE PRESERVE PO) Take 1 capsule by mouth 2 (two) times daily.       . nitroGLYCERIN (NITROSTAT) 0.4 MG SL tablet Place 0.4 mg under the tongue as needed. Chest pain      . ramipril (ALTACE) 2.5 MG capsule Take 2.5 mg by mouth daily.      . simvastatin (ZOCOR) 10 MG tablet Take 10 mg by mouth daily.      . traMADol (ULTRAM) 50 MG tablet Take 100 mg by mouth 2 (two) times daily as needed. for pain      . VITAMIN D, CHOLECALCIFEROL, PO Take 5,000 mg by mouth daily.       No current facility-administered medications for this visit.    Review of Systems Review of Systems  Eyes: Positive for visual disturbance (age related visual decline).  Respiratory: Positive for cough (husband notes chronic cough). Negative for shortness of breath.   Cardiovascular: Positive for leg swelling.  Negative for chest pain.  Gastrointestinal: Negative for abdominal pain, diarrhea, constipation and blood in stool.  Musculoskeletal: Positive for back pain, arthralgias and gait problem (pt ambulates with a cane).  Skin: Negative for color change (denies periumbilical skin changes).     Blood pressure 156/94, pulse 68, temperature 97.6 F (36.4 C), temperature source Oral, resp. rate 14, height 5\' 5"  (1.651 m), weight 190 lb 6.4 oz (86.365 kg).  Physical Exam Physical Exam  Constitutional: She is oriented to person, place, and time. She appears well-developed and well-nourished. No distress.  HENT:  Head: Normocephalic.  Cardiovascular: Normal rate, regular rhythm, normal heart sounds and intact distal pulses.  Exam reveals no gallop and no friction rub.   No murmur heard. Pt has pacemaker  Pulmonary/Chest: Effort normal and breath sounds normal. No respiratory distress. She has no wheezes. She has no rales.  Abdominal: Soft. She exhibits mass (umbilical hernia). There is no tenderness. A hernia is present.    Umbilical hernia that is somewhat reducible - fascial defect approximately 2 cm, but about 4 cm of "bulge." Skin starting to thin over the hernia. Feels like omentum, not bowel No tenderness to palpation  Musculoskeletal: She exhibits edema (bilateral pitting edema with venous stasis skin changes).  Neurological: She is alert and oriented to person, place, and time.  Skin: Skin is warm. No erythema.    Data Reviewed Reviewed recent cardiology reports.   Assessment    Umbilical hernia without signs of gangrene    Plan    Will check with Drs Graciela Husbands and Mayford Knife regarding pts ability to undergo surgery given her past/recent cardiac history.   Procedure will be scheduled upon clearance for surgery.  The procedure to repair umbilical hernia was discussed with the pt and all questions were answered.         Ralene Muskrat PA-S 10/22/2012, 11:26 AM

## 2012-10-22 NOTE — Patient Instructions (Signed)
We will check with your cardiologist and if they give the OK we can schedule surgery to repair your umbilical hernia

## 2012-10-26 DIAGNOSIS — R269 Unspecified abnormalities of gait and mobility: Secondary | ICD-10-CM | POA: Diagnosis not present

## 2012-10-26 DIAGNOSIS — M1710 Unilateral primary osteoarthritis, unspecified knee: Secondary | ICD-10-CM | POA: Diagnosis not present

## 2012-10-26 DIAGNOSIS — IMO0002 Reserved for concepts with insufficient information to code with codable children: Secondary | ICD-10-CM | POA: Diagnosis not present

## 2012-10-26 DIAGNOSIS — R279 Unspecified lack of coordination: Secondary | ICD-10-CM | POA: Diagnosis not present

## 2012-10-26 DIAGNOSIS — M6281 Muscle weakness (generalized): Secondary | ICD-10-CM | POA: Diagnosis not present

## 2012-10-28 DIAGNOSIS — IMO0002 Reserved for concepts with insufficient information to code with codable children: Secondary | ICD-10-CM | POA: Diagnosis not present

## 2012-10-28 DIAGNOSIS — R279 Unspecified lack of coordination: Secondary | ICD-10-CM | POA: Diagnosis not present

## 2012-10-28 DIAGNOSIS — R269 Unspecified abnormalities of gait and mobility: Secondary | ICD-10-CM | POA: Diagnosis not present

## 2012-10-28 DIAGNOSIS — M1710 Unilateral primary osteoarthritis, unspecified knee: Secondary | ICD-10-CM | POA: Diagnosis not present

## 2012-10-28 DIAGNOSIS — M6281 Muscle weakness (generalized): Secondary | ICD-10-CM | POA: Diagnosis not present

## 2012-11-03 DIAGNOSIS — M1710 Unilateral primary osteoarthritis, unspecified knee: Secondary | ICD-10-CM | POA: Diagnosis not present

## 2012-11-03 DIAGNOSIS — R279 Unspecified lack of coordination: Secondary | ICD-10-CM | POA: Diagnosis not present

## 2012-11-03 DIAGNOSIS — M6281 Muscle weakness (generalized): Secondary | ICD-10-CM | POA: Diagnosis not present

## 2012-11-03 DIAGNOSIS — IMO0002 Reserved for concepts with insufficient information to code with codable children: Secondary | ICD-10-CM | POA: Diagnosis not present

## 2012-11-03 DIAGNOSIS — R269 Unspecified abnormalities of gait and mobility: Secondary | ICD-10-CM | POA: Diagnosis not present

## 2012-11-05 DIAGNOSIS — R279 Unspecified lack of coordination: Secondary | ICD-10-CM | POA: Diagnosis not present

## 2012-11-05 DIAGNOSIS — M6281 Muscle weakness (generalized): Secondary | ICD-10-CM | POA: Diagnosis not present

## 2012-11-05 DIAGNOSIS — M1710 Unilateral primary osteoarthritis, unspecified knee: Secondary | ICD-10-CM | POA: Diagnosis not present

## 2012-11-05 DIAGNOSIS — IMO0002 Reserved for concepts with insufficient information to code with codable children: Secondary | ICD-10-CM | POA: Diagnosis not present

## 2012-11-05 DIAGNOSIS — R269 Unspecified abnormalities of gait and mobility: Secondary | ICD-10-CM | POA: Diagnosis not present

## 2012-11-09 DIAGNOSIS — R279 Unspecified lack of coordination: Secondary | ICD-10-CM | POA: Diagnosis not present

## 2012-11-09 DIAGNOSIS — M6281 Muscle weakness (generalized): Secondary | ICD-10-CM | POA: Diagnosis not present

## 2012-11-09 DIAGNOSIS — R269 Unspecified abnormalities of gait and mobility: Secondary | ICD-10-CM | POA: Diagnosis not present

## 2012-11-09 DIAGNOSIS — IMO0002 Reserved for concepts with insufficient information to code with codable children: Secondary | ICD-10-CM | POA: Diagnosis not present

## 2012-11-09 DIAGNOSIS — M1710 Unilateral primary osteoarthritis, unspecified knee: Secondary | ICD-10-CM | POA: Diagnosis not present

## 2012-11-10 DIAGNOSIS — R269 Unspecified abnormalities of gait and mobility: Secondary | ICD-10-CM | POA: Diagnosis not present

## 2012-11-10 DIAGNOSIS — IMO0002 Reserved for concepts with insufficient information to code with codable children: Secondary | ICD-10-CM | POA: Diagnosis not present

## 2012-11-10 DIAGNOSIS — M6281 Muscle weakness (generalized): Secondary | ICD-10-CM | POA: Diagnosis not present

## 2012-11-10 DIAGNOSIS — R279 Unspecified lack of coordination: Secondary | ICD-10-CM | POA: Diagnosis not present

## 2012-11-10 DIAGNOSIS — M1710 Unilateral primary osteoarthritis, unspecified knee: Secondary | ICD-10-CM | POA: Diagnosis not present

## 2012-11-11 DIAGNOSIS — E7800 Pure hypercholesterolemia, unspecified: Secondary | ICD-10-CM | POA: Diagnosis not present

## 2012-11-11 DIAGNOSIS — I2510 Atherosclerotic heart disease of native coronary artery without angina pectoris: Secondary | ICD-10-CM | POA: Diagnosis not present

## 2012-11-11 DIAGNOSIS — Z01810 Encounter for preprocedural cardiovascular examination: Secondary | ICD-10-CM | POA: Diagnosis not present

## 2012-11-12 ENCOUNTER — Encounter (INDEPENDENT_AMBULATORY_CARE_PROVIDER_SITE_OTHER): Payer: Self-pay

## 2012-11-15 ENCOUNTER — Other Ambulatory Visit (INDEPENDENT_AMBULATORY_CARE_PROVIDER_SITE_OTHER): Payer: Self-pay | Admitting: Surgery

## 2012-11-15 DIAGNOSIS — M6281 Muscle weakness (generalized): Secondary | ICD-10-CM | POA: Diagnosis not present

## 2012-11-15 DIAGNOSIS — M1710 Unilateral primary osteoarthritis, unspecified knee: Secondary | ICD-10-CM | POA: Diagnosis not present

## 2012-11-15 DIAGNOSIS — IMO0002 Reserved for concepts with insufficient information to code with codable children: Secondary | ICD-10-CM | POA: Diagnosis not present

## 2012-11-15 DIAGNOSIS — R269 Unspecified abnormalities of gait and mobility: Secondary | ICD-10-CM | POA: Diagnosis not present

## 2012-11-15 DIAGNOSIS — R279 Unspecified lack of coordination: Secondary | ICD-10-CM | POA: Diagnosis not present

## 2012-11-17 DIAGNOSIS — M1710 Unilateral primary osteoarthritis, unspecified knee: Secondary | ICD-10-CM | POA: Diagnosis not present

## 2012-11-17 DIAGNOSIS — IMO0002 Reserved for concepts with insufficient information to code with codable children: Secondary | ICD-10-CM | POA: Diagnosis not present

## 2012-11-17 DIAGNOSIS — R269 Unspecified abnormalities of gait and mobility: Secondary | ICD-10-CM | POA: Diagnosis not present

## 2012-11-17 DIAGNOSIS — M6281 Muscle weakness (generalized): Secondary | ICD-10-CM | POA: Diagnosis not present

## 2012-11-17 DIAGNOSIS — R279 Unspecified lack of coordination: Secondary | ICD-10-CM | POA: Diagnosis not present

## 2012-11-22 DIAGNOSIS — R269 Unspecified abnormalities of gait and mobility: Secondary | ICD-10-CM | POA: Diagnosis not present

## 2012-11-22 DIAGNOSIS — M6281 Muscle weakness (generalized): Secondary | ICD-10-CM | POA: Diagnosis not present

## 2012-11-22 DIAGNOSIS — IMO0002 Reserved for concepts with insufficient information to code with codable children: Secondary | ICD-10-CM | POA: Diagnosis not present

## 2012-11-22 DIAGNOSIS — R279 Unspecified lack of coordination: Secondary | ICD-10-CM | POA: Diagnosis not present

## 2012-11-22 DIAGNOSIS — M1710 Unilateral primary osteoarthritis, unspecified knee: Secondary | ICD-10-CM | POA: Diagnosis not present

## 2012-11-24 DIAGNOSIS — E538 Deficiency of other specified B group vitamins: Secondary | ICD-10-CM | POA: Diagnosis not present

## 2012-11-29 DIAGNOSIS — R269 Unspecified abnormalities of gait and mobility: Secondary | ICD-10-CM | POA: Diagnosis not present

## 2012-11-29 DIAGNOSIS — IMO0002 Reserved for concepts with insufficient information to code with codable children: Secondary | ICD-10-CM | POA: Diagnosis not present

## 2012-11-29 DIAGNOSIS — M1710 Unilateral primary osteoarthritis, unspecified knee: Secondary | ICD-10-CM | POA: Diagnosis not present

## 2012-11-29 DIAGNOSIS — M6281 Muscle weakness (generalized): Secondary | ICD-10-CM | POA: Diagnosis not present

## 2012-11-29 DIAGNOSIS — R279 Unspecified lack of coordination: Secondary | ICD-10-CM | POA: Diagnosis not present

## 2012-12-01 DIAGNOSIS — M1710 Unilateral primary osteoarthritis, unspecified knee: Secondary | ICD-10-CM | POA: Diagnosis not present

## 2012-12-01 DIAGNOSIS — R269 Unspecified abnormalities of gait and mobility: Secondary | ICD-10-CM | POA: Diagnosis not present

## 2012-12-01 DIAGNOSIS — R279 Unspecified lack of coordination: Secondary | ICD-10-CM | POA: Diagnosis not present

## 2012-12-01 DIAGNOSIS — IMO0002 Reserved for concepts with insufficient information to code with codable children: Secondary | ICD-10-CM | POA: Diagnosis not present

## 2012-12-01 DIAGNOSIS — M6281 Muscle weakness (generalized): Secondary | ICD-10-CM | POA: Diagnosis not present

## 2012-12-06 DIAGNOSIS — IMO0002 Reserved for concepts with insufficient information to code with codable children: Secondary | ICD-10-CM | POA: Diagnosis not present

## 2012-12-06 DIAGNOSIS — M6281 Muscle weakness (generalized): Secondary | ICD-10-CM | POA: Diagnosis not present

## 2012-12-06 DIAGNOSIS — M1710 Unilateral primary osteoarthritis, unspecified knee: Secondary | ICD-10-CM | POA: Diagnosis not present

## 2012-12-06 DIAGNOSIS — R279 Unspecified lack of coordination: Secondary | ICD-10-CM | POA: Diagnosis not present

## 2012-12-06 DIAGNOSIS — R269 Unspecified abnormalities of gait and mobility: Secondary | ICD-10-CM | POA: Diagnosis not present

## 2012-12-08 DIAGNOSIS — IMO0002 Reserved for concepts with insufficient information to code with codable children: Secondary | ICD-10-CM | POA: Diagnosis not present

## 2012-12-08 DIAGNOSIS — M6281 Muscle weakness (generalized): Secondary | ICD-10-CM | POA: Diagnosis not present

## 2012-12-08 DIAGNOSIS — M1710 Unilateral primary osteoarthritis, unspecified knee: Secondary | ICD-10-CM | POA: Diagnosis not present

## 2012-12-08 DIAGNOSIS — R269 Unspecified abnormalities of gait and mobility: Secondary | ICD-10-CM | POA: Diagnosis not present

## 2012-12-08 DIAGNOSIS — R279 Unspecified lack of coordination: Secondary | ICD-10-CM | POA: Diagnosis not present

## 2012-12-13 ENCOUNTER — Telehealth (INDEPENDENT_AMBULATORY_CARE_PROVIDER_SITE_OTHER): Payer: Self-pay | Admitting: Surgery

## 2012-12-13 ENCOUNTER — Telehealth (INDEPENDENT_AMBULATORY_CARE_PROVIDER_SITE_OTHER): Payer: Self-pay | Admitting: *Deleted

## 2012-12-13 NOTE — Telephone Encounter (Signed)
Discussed rescheduling surgery with her, moved to June 9

## 2012-12-13 NOTE — Telephone Encounter (Signed)
Patient called back returning a telephone call to [facility] MD.  Please call patient when you can if you still need to speak with her.

## 2012-12-16 ENCOUNTER — Encounter (HOSPITAL_BASED_OUTPATIENT_CLINIC_OR_DEPARTMENT_OTHER): Payer: Self-pay

## 2012-12-16 ENCOUNTER — Ambulatory Visit (HOSPITAL_BASED_OUTPATIENT_CLINIC_OR_DEPARTMENT_OTHER): Admit: 2012-12-16 | Payer: Medicare Other | Admitting: Surgery

## 2012-12-16 SURGERY — REPAIR, HERNIA, UMBILICAL, ADULT
Anesthesia: Monitor Anesthesia Care

## 2012-12-17 ENCOUNTER — Ambulatory Visit: Admit: 2012-12-17 | Payer: Medicare Other | Admitting: Surgery

## 2012-12-17 SURGERY — REPAIR, HERNIA, UMBILICAL, ADULT
Anesthesia: Monitor Anesthesia Care

## 2012-12-22 DIAGNOSIS — M1710 Unilateral primary osteoarthritis, unspecified knee: Secondary | ICD-10-CM | POA: Diagnosis not present

## 2012-12-23 DIAGNOSIS — IMO0002 Reserved for concepts with insufficient information to code with codable children: Secondary | ICD-10-CM | POA: Diagnosis not present

## 2012-12-23 DIAGNOSIS — R279 Unspecified lack of coordination: Secondary | ICD-10-CM | POA: Diagnosis not present

## 2012-12-23 DIAGNOSIS — M1710 Unilateral primary osteoarthritis, unspecified knee: Secondary | ICD-10-CM | POA: Diagnosis not present

## 2012-12-23 DIAGNOSIS — R269 Unspecified abnormalities of gait and mobility: Secondary | ICD-10-CM | POA: Diagnosis not present

## 2012-12-23 DIAGNOSIS — M6281 Muscle weakness (generalized): Secondary | ICD-10-CM | POA: Diagnosis not present

## 2012-12-28 DIAGNOSIS — R269 Unspecified abnormalities of gait and mobility: Secondary | ICD-10-CM | POA: Diagnosis not present

## 2012-12-28 DIAGNOSIS — M6281 Muscle weakness (generalized): Secondary | ICD-10-CM | POA: Diagnosis not present

## 2012-12-28 DIAGNOSIS — M1710 Unilateral primary osteoarthritis, unspecified knee: Secondary | ICD-10-CM | POA: Diagnosis not present

## 2012-12-28 DIAGNOSIS — IMO0002 Reserved for concepts with insufficient information to code with codable children: Secondary | ICD-10-CM | POA: Diagnosis not present

## 2012-12-28 DIAGNOSIS — R279 Unspecified lack of coordination: Secondary | ICD-10-CM | POA: Diagnosis not present

## 2012-12-29 DIAGNOSIS — E538 Deficiency of other specified B group vitamins: Secondary | ICD-10-CM | POA: Diagnosis not present

## 2012-12-30 DIAGNOSIS — M1710 Unilateral primary osteoarthritis, unspecified knee: Secondary | ICD-10-CM | POA: Diagnosis not present

## 2012-12-30 DIAGNOSIS — M6281 Muscle weakness (generalized): Secondary | ICD-10-CM | POA: Diagnosis not present

## 2012-12-30 DIAGNOSIS — R279 Unspecified lack of coordination: Secondary | ICD-10-CM | POA: Diagnosis not present

## 2012-12-30 DIAGNOSIS — IMO0002 Reserved for concepts with insufficient information to code with codable children: Secondary | ICD-10-CM | POA: Diagnosis not present

## 2012-12-30 DIAGNOSIS — R269 Unspecified abnormalities of gait and mobility: Secondary | ICD-10-CM | POA: Diagnosis not present

## 2013-01-03 ENCOUNTER — Encounter (HOSPITAL_COMMUNITY): Payer: Self-pay | Admitting: Pharmacy Technician

## 2013-01-03 DIAGNOSIS — M1710 Unilateral primary osteoarthritis, unspecified knee: Secondary | ICD-10-CM | POA: Diagnosis not present

## 2013-01-03 DIAGNOSIS — R269 Unspecified abnormalities of gait and mobility: Secondary | ICD-10-CM | POA: Diagnosis not present

## 2013-01-03 DIAGNOSIS — M6281 Muscle weakness (generalized): Secondary | ICD-10-CM | POA: Diagnosis not present

## 2013-01-03 DIAGNOSIS — IMO0002 Reserved for concepts with insufficient information to code with codable children: Secondary | ICD-10-CM | POA: Diagnosis not present

## 2013-01-03 DIAGNOSIS — R279 Unspecified lack of coordination: Secondary | ICD-10-CM | POA: Diagnosis not present

## 2013-01-04 ENCOUNTER — Encounter (HOSPITAL_COMMUNITY): Payer: Self-pay

## 2013-01-04 ENCOUNTER — Encounter (HOSPITAL_COMMUNITY)
Admission: RE | Admit: 2013-01-04 | Discharge: 2013-01-04 | Disposition: A | Discharge status: Home / Self Care | Payer: Medicare Other | Source: Ambulatory Visit | Attending: Surgery | Admitting: Surgery

## 2013-01-04 DIAGNOSIS — K219 Gastro-esophageal reflux disease without esophagitis: Secondary | ICD-10-CM | POA: Diagnosis not present

## 2013-01-04 DIAGNOSIS — I209 Angina pectoris, unspecified: Secondary | ICD-10-CM | POA: Diagnosis not present

## 2013-01-04 DIAGNOSIS — I2510 Atherosclerotic heart disease of native coronary artery without angina pectoris: Secondary | ICD-10-CM | POA: Diagnosis not present

## 2013-01-04 DIAGNOSIS — I10 Essential (primary) hypertension: Secondary | ICD-10-CM | POA: Diagnosis not present

## 2013-01-04 DIAGNOSIS — K420 Umbilical hernia with obstruction, without gangrene: Secondary | ICD-10-CM | POA: Diagnosis not present

## 2013-01-04 DIAGNOSIS — Z950 Presence of cardiac pacemaker: Secondary | ICD-10-CM | POA: Diagnosis not present

## 2013-01-04 DIAGNOSIS — I499 Cardiac arrhythmia, unspecified: Secondary | ICD-10-CM | POA: Diagnosis not present

## 2013-01-04 DIAGNOSIS — Z9861 Coronary angioplasty status: Secondary | ICD-10-CM | POA: Diagnosis not present

## 2013-01-04 DIAGNOSIS — I509 Heart failure, unspecified: Secondary | ICD-10-CM | POA: Diagnosis not present

## 2013-01-04 HISTORY — DX: Essential (primary) hypertension: I10

## 2013-01-04 HISTORY — DX: Gastro-esophageal reflux disease without esophagitis: K21.9

## 2013-01-04 LAB — BASIC METABOLIC PANEL
BUN: 19 mg/dL (ref 6–23)
CO2: 28 mEq/L (ref 19–32)
Calcium: 9.5 mg/dL (ref 8.4–10.5)
Chloride: 103 mEq/L (ref 96–112)
Creatinine, Ser: 0.88 mg/dL (ref 0.50–1.10)
GFR calc Af Amer: 67 mL/min — ABNORMAL LOW (ref >90)
GFR calc non Af Amer: 57 mL/min — ABNORMAL LOW (ref >90)
Glucose, Bld: 98 mg/dL (ref 70–99)
Potassium: 4.1 mEq/L (ref 3.5–5.1)
Sodium: 140 mEq/L (ref 135–145)

## 2013-01-04 LAB — CBC
HCT: 35.6 % — ABNORMAL LOW (ref 36.0–46.0)
Hemoglobin: 11.8 g/dL — ABNORMAL LOW (ref 12.0–15.0)
MCH: 31.1 pg (ref 26.0–34.0)
MCHC: 33.1 g/dL (ref 30.0–36.0)
MCV: 93.9 fL (ref 78.0–100.0)
Platelets: 217 10*3/uL (ref 150–400)
RBC: 3.79 MIL/uL — ABNORMAL LOW (ref 3.87–5.11)
RDW: 13.9 % (ref 11.5–15.5)
WBC: 8 10*3/uL (ref 4.0–10.5)

## 2013-01-04 LAB — SURGICAL PCR SCREEN
MRSA, PCR: NEGATIVE
Staphylococcus aureus: NEGATIVE

## 2013-01-04 NOTE — Pre-Procedure Instructions (Addendum)
[name]  01/04/2013   Your procedure is scheduled on:  01/10/13  Report to [facility] at 530AM.  Call this number if you have problems the morning of surgery: [phone]   Remember:   Do not eat food or drink liquids after midnight.   Take these medicines the morning of surgery with A SIP OF WATER: carvedilol,isodorbide, tramadol   STOP aspirin, plavix per dr meloxicam now   Do not wear jewelry, make-up or nail polish.  Do not wear lotions, powders, or perfumes. You may wear deodorant.  Do not shave 48 hours prior to surgery. Men may shave face and neck.  Do not bring valuables to the hospital.  [facility] is not responsible                   for any belongings or valuables.  Contacts, dentures or bridgework may not be worn into surgery.  Leave suitcase in the car. After surgery it may be brought to your room.  For patients admitted to the hospital, checkout time is 11:00 AM the day of  discharge.   Patients discharged the day of surgery will not be allowed to drive  home.  Name and phone number of your driver:   Special Instructions: Shower using CHG 2 nights before surgery and the night before surgery.  If you shower the day of surgery use CHG.  Use special wash - you have one bottle of CHG for all showers.  You should use approximately 1/3 of the bottle for each shower.   Please read over the following fact sheets that you were given: Pain Booklet, Coughing and Deep Breathing, MRSA Information and Surgical Site Infection Prevention

## 2013-01-04 NOTE — Progress Notes (Signed)
Message left for dr turner RE: d/c date for plavix, aspirin Pacer orders faxed to dr Roney Jaffe  dr turner clearance note under" procedures" in epic cxr 10/13, ekg 2/14 ,echo 6/13 dr Graciela Husbands note 1/14

## 2013-01-05 ENCOUNTER — Encounter (HOSPITAL_COMMUNITY): Payer: Self-pay

## 2013-01-05 DIAGNOSIS — M6281 Muscle weakness (generalized): Secondary | ICD-10-CM | POA: Diagnosis not present

## 2013-01-05 DIAGNOSIS — IMO0002 Reserved for concepts with insufficient information to code with codable children: Secondary | ICD-10-CM | POA: Diagnosis not present

## 2013-01-05 DIAGNOSIS — R279 Unspecified lack of coordination: Secondary | ICD-10-CM | POA: Diagnosis not present

## 2013-01-05 DIAGNOSIS — M1710 Unilateral primary osteoarthritis, unspecified knee: Secondary | ICD-10-CM | POA: Diagnosis not present

## 2013-01-05 DIAGNOSIS — R269 Unspecified abnormalities of gait and mobility: Secondary | ICD-10-CM | POA: Diagnosis not present

## 2013-01-05 NOTE — Progress Notes (Signed)
Anesthesia chart review: Patient is an 79 year old female scheduled for repair of umbilical hernia by Dr. Jamey Ripa on 01/10/13. Case is posted for MAC.  History includes former smoker, CAD s/p DES LAD 01/2010 with angioplasty for in-stent restenosis 10/2010 , chronic systolic CHF, ischemic cardiomyopathy s/p dual chamber PPM ([employer] CRT-P) 05/27/12 and requiring LV lead repositioning 05/31/12, left bundle branch block posterior arthritis, hypertension, GERD, breast cancer s/p bilateral mastectomies, macular degeneration, right THA. PCP is Dr. Blair Heys.  Primary cardiologist is Dr. Armanda Magic who cleared patient for this procedure.  EP Cardiologist is Dr. Graciela Husbands who is also aware of plans for surgery.  His office has recommended asynchronous pacing during the procedure.  I've asked Short Stay staff to notify the [employer] representative.  EKG on 09/15/12 showed v-paced rhythm.   Nuclear stress test on 11/11/12 showed normal myocardial perfusion, EF 73% [facility]).  Echo on 01/22/12 showed LVEF estimated at 31.2%, mild mitral regurgitation, aortic valve sclerosis but opens well, moderate calcification aortic valve, abnormal septal wall motion due to bundle-branch block, mild tricuspid regurgitation, Doppler findings suggestive of grade 1 diastolic dysfunction without elevated left atrial pressure.  Cardiac cath on 10/07/10 showed: 1. A 50-60% in-stent restenosis of proximal left anterior descending.  2. An 80% mid left anterior descending stenosis.  3. An 80% diagonal 1 stenosis.  4. Nonobstructive disease of the right coronary artery.  5. Mild-to-moderate left ventricular dysfunction. EF 35-40%. She subsequently underwent cutting balloon angioplasty of the LAD in-stent restenosis on 10/10/10.  CXR report on 06/01/12 showed: Lateral view degraded by patient arm position. Mild upper thoracic compression deformity which is unchanged. New pacer with leads right atrium right ventricle. Right  glenohumeral joint osteoarthritis. Midline trachea. Normal heart size. Tortuous thoracic aorta. Similar prominence of the right hilum (back to 09/24/2010) with surgical clips in this region. Minimal left costophrenic angle blunting is likely due to  pleural thickening and is unchanged. Mild left base scarring or atelectasis. Surgical clips project over the lateral hemithorax bilaterally. No acute findings.    Preoperative labs noted.  She has been cleared by her cardiologist.  If no acute changes then I would anticipate that she could proceed as planned.  Velna Ochs [facility] Short Stay Center/Anesthesiology Phone [phone] 01/05/2013 1:40 PM

## 2013-01-06 NOTE — Progress Notes (Signed)
Notified Marcia [employer] Rep of patient needing asynch. Pacing during surgery 01/10/13 730 case.

## 2013-01-06 NOTE — Progress Notes (Signed)
Paged [employer] REP.

## 2013-01-06 NOTE — Progress Notes (Signed)
Spoke with [name] who stated the office contacted her yesterday and instructed her to stop aspirin and plavix.

## 2013-01-09 MED ORDER — CEFAZOLIN SODIUM-DEXTROSE 2-3 GM-% IV SOLR
2.0000 g | INTRAVENOUS | Status: AC
  Administered 2013-01-10: 2 g via INTRAVENOUS
  Filled 2013-01-09: qty 50

## 2013-01-10 ENCOUNTER — Ambulatory Visit (HOSPITAL_COMMUNITY)
Admission: RE | Admit: 2013-01-10 | Discharge: 2013-01-10 | Disposition: A | Discharge status: Home / Self Care | Payer: Medicare Other | Source: Ambulatory Visit | Attending: Surgery | Admitting: Surgery

## 2013-01-10 ENCOUNTER — Encounter (HOSPITAL_COMMUNITY): Payer: Self-pay | Admitting: *Deleted

## 2013-01-10 ENCOUNTER — Encounter (HOSPITAL_COMMUNITY): Payer: Self-pay | Admitting: Vascular Surgery

## 2013-01-10 ENCOUNTER — Encounter (HOSPITAL_COMMUNITY)
Admission: RE | Disposition: A | Discharge status: Home / Self Care | Payer: Self-pay | Source: Ambulatory Visit | Attending: Surgery

## 2013-01-10 ENCOUNTER — Ambulatory Visit (HOSPITAL_COMMUNITY): Payer: Medicare Other | Admitting: Anesthesiology

## 2013-01-10 DIAGNOSIS — K429 Umbilical hernia without obstruction or gangrene: Secondary | ICD-10-CM | POA: Diagnosis present

## 2013-01-10 DIAGNOSIS — K219 Gastro-esophageal reflux disease without esophagitis: Secondary | ICD-10-CM | POA: Insufficient documentation

## 2013-01-10 DIAGNOSIS — I499 Cardiac arrhythmia, unspecified: Secondary | ICD-10-CM | POA: Insufficient documentation

## 2013-01-10 DIAGNOSIS — I209 Angina pectoris, unspecified: Secondary | ICD-10-CM | POA: Insufficient documentation

## 2013-01-10 DIAGNOSIS — K420 Umbilical hernia with obstruction, without gangrene: Secondary | ICD-10-CM | POA: Diagnosis not present

## 2013-01-10 DIAGNOSIS — Z9861 Coronary angioplasty status: Secondary | ICD-10-CM | POA: Insufficient documentation

## 2013-01-10 DIAGNOSIS — I10 Essential (primary) hypertension: Secondary | ICD-10-CM | POA: Insufficient documentation

## 2013-01-10 DIAGNOSIS — I2510 Atherosclerotic heart disease of native coronary artery without angina pectoris: Secondary | ICD-10-CM | POA: Diagnosis not present

## 2013-01-10 DIAGNOSIS — I509 Heart failure, unspecified: Secondary | ICD-10-CM | POA: Insufficient documentation

## 2013-01-10 DIAGNOSIS — Z950 Presence of cardiac pacemaker: Secondary | ICD-10-CM | POA: Insufficient documentation

## 2013-01-10 HISTORY — PX: INSERTION OF MESH: SHX5868

## 2013-01-10 HISTORY — PX: UMBILICAL HERNIA REPAIR: SHX196

## 2013-01-10 HISTORY — DX: Umbilical hernia without obstruction or gangrene: K42.9

## 2013-01-10 SURGERY — REPAIR, HERNIA, UMBILICAL, ADULT
Anesthesia: Monitor Anesthesia Care | Site: Abdomen | Wound class: Clean

## 2013-01-10 MED ORDER — MINERAL OIL LIGHT 100 % EX OIL
TOPICAL_OIL | CUTANEOUS | Status: AC
  Filled 2013-01-10: qty 25

## 2013-01-10 MED ORDER — LIDOCAINE HCL 1 % IJ SOLN
INTRAMUSCULAR | Status: DC | PRN
  Administered 2013-01-10 (×2)

## 2013-01-10 MED ORDER — PROPOFOL INFUSION 10 MG/ML OPTIME
INTRAVENOUS | Status: DC | PRN
  Administered 2013-01-10: 100 ug/kg/min via INTRAVENOUS

## 2013-01-10 MED ORDER — HYDROCODONE-ACETAMINOPHEN 5-325 MG PO TABS: 1.0000 | ORAL_TABLET | ORAL | Status: DC | PRN

## 2013-01-10 MED ORDER — BUPIVACAINE HCL (PF) 0.5 % IJ SOLN
INTRAMUSCULAR | Status: AC
  Filled 2013-01-10: qty 30

## 2013-01-10 MED ORDER — LACTATED RINGERS IV SOLN
INTRAVENOUS | Status: DC | PRN
  Administered 2013-01-10: 07:00:00 via INTRAVENOUS

## 2013-01-10 MED ORDER — CHLORHEXIDINE GLUCONATE 4 % EX LIQD: 1.0000 "application " | Freq: Once | CUTANEOUS | Status: DC

## 2013-01-10 MED ORDER — 0.9 % SODIUM CHLORIDE (POUR BTL) OPTIME
TOPICAL | Status: DC | PRN
  Administered 2013-01-10: 1000 mL

## 2013-01-10 MED ORDER — LIDOCAINE HCL (CARDIAC) 20 MG/ML IV SOLN
INTRAVENOUS | Status: DC | PRN
  Administered 2013-01-10: 60 mg via INTRAVENOUS

## 2013-01-10 MED ORDER — FENTANYL CITRATE 0.05 MG/ML IJ SOLN
INTRAMUSCULAR | Status: DC | PRN
  Administered 2013-01-10: 25 ug via INTRAVENOUS
  Administered 2013-01-10: 50 ug via INTRAVENOUS
  Administered 2013-01-10 (×3): 25 ug via INTRAVENOUS

## 2013-01-10 MED ORDER — BUPIVACAINE HCL (PF) 0.25 % IJ SOLN
INTRAMUSCULAR | Status: AC
  Filled 2013-01-10: qty 30

## 2013-01-10 MED ORDER — LIDOCAINE HCL (PF) 1 % IJ SOLN
INTRAMUSCULAR | Status: AC
  Filled 2013-01-10: qty 30

## 2013-01-10 SURGICAL SUPPLY — 61 items
ADH SKN CLS LQ APL DERMABOND (GAUZE/BANDAGES/DRESSINGS) ×2
APL SKNCLS STERI-STRIP NONHPOA (GAUZE/BANDAGES/DRESSINGS) ×1
BALL CTTN LRG ABS STRL LF (GAUZE/BANDAGES/DRESSINGS)
BENZOIN TINCTURE PRP APPL 2/3 (GAUZE/BANDAGES/DRESSINGS) ×2 IMPLANT
BLADE SURG 10 STRL SS (BLADE) ×2 IMPLANT
BLADE SURG 15 STRL LF DISP TIS (BLADE) ×1 IMPLANT
BLADE SURG 15 STRL SS (BLADE) ×2
BLADE SURG ROTATE 9660 (MISCELLANEOUS) IMPLANT
CANISTER SUCTION 2500CC (MISCELLANEOUS) IMPLANT
CHLORAPREP W/TINT 26ML (MISCELLANEOUS) ×2 IMPLANT
CLOTH BEACON ORANGE TIMEOUT ST (SAFETY) ×2 IMPLANT
COTTONBALL LRG STERILE PKG (GAUZE/BANDAGES/DRESSINGS) ×1 IMPLANT
COVER SURGICAL LIGHT HANDLE (MISCELLANEOUS) ×2 IMPLANT
DECANTER SPIKE VIAL GLASS SM (MISCELLANEOUS) ×2 IMPLANT
DERMABOND ADHESIVE PROPEN (GAUZE/BANDAGES/DRESSINGS) ×2
DERMABOND ADVANCED .7 DNX6 (GAUZE/BANDAGES/DRESSINGS) IMPLANT
DRAPE PED LAPAROTOMY (DRAPES) ×2 IMPLANT
DRAPE UTILITY 15X26 W/TAPE STR (DRAPE) ×4 IMPLANT
ELECT CAUTERY BLADE 6.4 (BLADE) ×2 IMPLANT
ELECT REM PT RETURN 9FT ADLT (ELECTROSURGICAL) ×2
ELECTRODE REM PT RTRN 9FT ADLT (ELECTROSURGICAL) ×1 IMPLANT
GLOVE BIO SURGEON STRL SZ7.5 (GLOVE) ×1 IMPLANT
GLOVE BIOGEL PI IND STRL 7.0 (GLOVE) IMPLANT
GLOVE BIOGEL PI IND STRL 7.5 (GLOVE) IMPLANT
GLOVE BIOGEL PI INDICATOR 7.0 (GLOVE) ×1
GLOVE BIOGEL PI INDICATOR 7.5 (GLOVE) ×1
GLOVE EUDERMIC 7 POWDERFREE (GLOVE) ×2 IMPLANT
GOWN PREVENTION PLUS XLARGE (GOWN DISPOSABLE) ×2 IMPLANT
GOWN STRL NON-REIN LRG LVL3 (GOWN DISPOSABLE) ×2 IMPLANT
KIT BASIN OR (CUSTOM PROCEDURE TRAY) ×2 IMPLANT
KIT ROOM TURNOVER OR (KITS) ×2 IMPLANT
NDL HYPO 25GX1X1/2 BEV (NEEDLE) ×1 IMPLANT
NEEDLE HYPO 25GX1X1/2 BEV (NEEDLE) ×2 IMPLANT
NS IRRIG 1000ML POUR BTL (IV SOLUTION) ×2 IMPLANT
PACK SURGICAL SETUP 50X90 (CUSTOM PROCEDURE TRAY) ×2 IMPLANT
PAD ARMBOARD 7.5X6 YLW CONV (MISCELLANEOUS) ×4 IMPLANT
PATCH VENTRAL SMALL 4.3 (Mesh Specialty) ×1 IMPLANT
PENCIL BUTTON HOLSTER BLD 10FT (ELECTRODE) ×2 IMPLANT
SPONGE GAUZE 4X4 12PLY (GAUZE/BANDAGES/DRESSINGS) ×2 IMPLANT
SPONGE GAUZE 4X4 16PLY UNSTER (WOUND CARE) ×2 IMPLANT
SPONGE INTESTINAL PEANUT (DISPOSABLE) ×1 IMPLANT
SPONGE LAP 18X18 X RAY DECT (DISPOSABLE) ×1 IMPLANT
SPONGE LAP 4X18 X RAY DECT (DISPOSABLE) ×2 IMPLANT
STAPLER VISISTAT 35W (STAPLE) ×2 IMPLANT
STRIP CLOSURE SKIN 1/2X4 (GAUZE/BANDAGES/DRESSINGS) ×2 IMPLANT
SUT MON AB 4-0 PC3 18 (SUTURE) ×2 IMPLANT
SUT NOVA NAB GS-21 1 T12 (SUTURE) IMPLANT
SUT PROLENE 0 CT 1 CR/8 (SUTURE) ×1 IMPLANT
SUT PROLENE 2 0 CT2 30 (SUTURE) IMPLANT
SUT SILK 2 0 TIES 10X30 (SUTURE) IMPLANT
SUT VIC AB 3-0 SH 18 (SUTURE) ×2 IMPLANT
SUT VIC AB 3-0 SH 27 (SUTURE) ×2
SUT VIC AB 3-0 SH 27XBRD (SUTURE) IMPLANT
SYR BULB 3OZ (MISCELLANEOUS) ×2 IMPLANT
SYR CONTROL 10ML LL (SYRINGE) ×2 IMPLANT
TAPE CLOTH SURG 4X10 WHT LF (GAUZE/BANDAGES/DRESSINGS) ×1 IMPLANT
TOWEL OR 17X24 6PK STRL BLUE (TOWEL DISPOSABLE) ×2 IMPLANT
TOWEL OR 17X26 10 PK STRL BLUE (TOWEL DISPOSABLE) ×2 IMPLANT
TUBE CONNECTING 12X1/4 (SUCTIONS) ×1 IMPLANT
WATER STERILE IRR 1000ML POUR (IV SOLUTION) IMPLANT
YANKAUER SUCT BULB TIP NO VENT (SUCTIONS) ×1 IMPLANT

## 2013-01-10 NOTE — Anesthesia Preprocedure Evaluation (Addendum)
Anesthesia Evaluation  Patient identified by MRN, date of birth, ID band Patient awake    Reviewed: Allergy & Precautions, H&P , NPO status , Patient's Chart, lab work & pertinent test results, reviewed documented beta blocker date and time   Airway Mallampati: I TM Distance: >3 FB Neck ROM: full    Dental  (+) Teeth Intact and Dental Advisory Given   Pulmonary shortness of breath and with exertion,  breath sounds clear to auscultation        Cardiovascular hypertension, Pt. on medications + angina with exertion + CAD, + Cardiac Stents and +CHF + dysrhythmias + pacemaker Rhythm:regular Rate:Normal     Neuro/Psych    GI/Hepatic GERD-  Controlled,  Endo/Other    Renal/GU      Musculoskeletal   Abdominal (+)  Abdomen: soft. Bowel sounds: normal.  Peds  Hematology   Anesthesia Other Findings   Reproductive/Obstetrics                         Anesthesia Physical Anesthesia Plan  ASA: III  Anesthesia Plan: MAC   Post-op Pain Management:    Induction: Intravenous  Airway Management Planned:   Additional Equipment:   Intra-op Plan:   Post-operative Plan:   Informed Consent: I have reviewed the patients History and Physical, chart, labs and discussed the procedure including the risks, benefits and alternatives for the proposed anesthesia with the patient or authorized representative who has indicated his/her understanding and acceptance.     Plan Discussed with: CRNA, Anesthesiologist and Surgeon  Anesthesia Plan Comments:         Anesthesia Quick Evaluation

## 2013-01-10 NOTE — Transfer of Care (Signed)
Immediate Anesthesia Transfer of Care Note  Patient: [name]  Procedure(s) Performed: Procedure(s): HERNIA REPAIR UMBILICAL ADULT (N/A) INSERTION OF MESH (N/A)  Patient Location: PACU  Anesthesia Type:MAC  Level of Consciousness: awake, alert  and oriented  Airway & Oxygen Therapy: Patient connected to face mask oxygen  Post-op Assessment: Report given to PACU RN  Post vital signs: stable  Complications: No apparent anesthesia complications

## 2013-01-10 NOTE — Op Note (Signed)
[name] [facility] 01-05-1923 [id number] 12/13/2012  Preoperative diagnosis: Chronically incarceated umbilical hernia   Postoperative diagnosis: Same  Procedure: Umbilical Hernia Repair with mesh  Surgeon: Currie Paris, MD, FACS  Anesthesia: MAC   Clinical History and Indications: This patient has a chronically incarcerated umbilical hernia she wishes to have reparied  Procedure: The patient was seen in the preoperative area and the plans for the procedure reviewed again. She  had no further questions. I marked the area of the umbilicus as the operative site. She wishes to prodeed.  The patient was taken to the operating room and after satisfactory MAC anesthesia had been obtained the area was , prepped and draped. The timeout was performed.  I used 1% xylo mixed equally with 0.5% marcaine as local This was infiltrated around the umbilical area and additional infiltrated as I worked.  A curvilinear incision was made on the inferior aspect of the umbilicus. The umbilical skin was elevated off of the hernia sac. The hernia sac was dissected free of the subcutaneous tissues.  I could not reduce the hernia initially, so I opened the sac and excised it. The omentum that was protruding was easily reduced. A mesh patch was placed and secured with 0 Prolene. The defect, wioth was bouot 2.5 cm, was then closed over the mesh with additional prolene sutures. There was no tension.  Once the repair was complete the incision was closed by using some 3-0 Vicryl subcutaneous and 4-0 Monocryl subcuticular sutures.  The patient tolerated the procedure well. There were no operative complications. There was minimal blood loss. All counts were correct. She was taken to the PACU in satisfactory condition.  Currie Paris, MD, FACS 01/10/2013 8:16 AM

## 2013-01-10 NOTE — H&P (Signed)
NAME: [name]       DOB: [birth year]           DATE: 12/13/2012       UEA:[id number]  YN:[id number] hernia  HPI: Patient presents for scheduled repair of an umbilical hernia. She ahs had no additional problems with it since her office visit. Since that visit we have an "OK" from cardiology to proceed to surgery.  EXAM:  VITAL SIGNS:  BP 137/74  Pulse 90  Temp(Src) 97.5 F (36.4 C) (Oral)  Resp 20  SpO2 94%  GENERAL:  The patient is alert, oriented, and generally healthy-appearing, NAD. Mood and affect are normal.  HEENT:  The head is normocephalic, the eyes nonicteric, the pupils were round regular and equal. EOMs are normal. Pharynx normal. Dentition good.  NECK:  The neck is supple and there are no masses or thyromegaly.  LUNGS: Normal respirations and clear to auscultation.  HEART: Regular rhythm, with no murmurs rubs or gallops. Pulses are intact carotid dorsalis pedis and posterior tibial. No significant varicosities are noted.  Soft, flat, and nontender. No masses or organomegaly is noted. She has a reducible umbilical hernia Bowel sounds are normal.  EXTREMITIES:  Good range of motion, no edema.  IMP: Reducible umbilical hernia, desires repair  PLAN: Repair under MAC anesthesia. Procedure risks etc reviewed again with patient and questions answered. Umbilical area marked as the operative site.  [name] J 01/10/2013

## 2013-01-10 NOTE — Anesthesia Procedure Notes (Signed)
Procedure Name: MAC Date/Time: 01/10/2013 7:40 AM Performed by: Ellin Goodie Pre-anesthesia Checklist: Patient identified, Emergency Drugs available, Suction available, Patient being monitored and Timeout performed Patient Re-evaluated:Patient Re-evaluated prior to inductionOxygen Delivery Method: Simple face mask Preoxygenation: Pre-oxygenation with 100% oxygen Intubation Type: IV induction Placement Confirmation: positive ETCO2 and breath sounds checked- equal and bilateral Dental Injury: Teeth and Oropharynx as per pre-operative assessment

## 2013-01-10 NOTE — Anesthesia Postprocedure Evaluation (Signed)
  Anesthesia Post-op Note  Patient: [name]  Procedure(s) Performed: Procedure(s): HERNIA REPAIR UMBILICAL ADULT (N/A) INSERTION OF MESH (N/A)  Patient Location: PACU  Anesthesia Type:MAC  Level of Consciousness: awake, oriented and patient cooperative  Airway and Oxygen Therapy: Patient Spontanous Breathing  Post-op Pain: mild  Post-op Assessment: Post-op Vital signs reviewed, Patient's Cardiovascular Status Stable, Respiratory Function Stable, Patent Airway, No signs of Nausea or vomiting and Pain level controlled  Post-op Vital Signs: stable  Complications: No apparent anesthesia complications

## 2013-01-10 NOTE — Progress Notes (Addendum)
Leta Jungling with [employer] notified of pt going to the holding area--states she is in the hospital

## 2013-01-10 NOTE — Preoperative (Signed)
Beta Blockers   Reason not to administer Beta Blockers:Not Applicable 

## 2013-01-11 ENCOUNTER — Encounter (HOSPITAL_COMMUNITY): Payer: Self-pay | Admitting: Surgery

## 2013-01-24 DIAGNOSIS — I2589 Other forms of chronic ischemic heart disease: Secondary | ICD-10-CM | POA: Diagnosis not present

## 2013-01-24 DIAGNOSIS — I2510 Atherosclerotic heart disease of native coronary artery without angina pectoris: Secondary | ICD-10-CM | POA: Diagnosis not present

## 2013-01-24 DIAGNOSIS — I059 Rheumatic mitral valve disease, unspecified: Secondary | ICD-10-CM | POA: Diagnosis not present

## 2013-01-24 DIAGNOSIS — E7800 Pure hypercholesterolemia, unspecified: Secondary | ICD-10-CM | POA: Diagnosis not present

## 2013-01-24 DIAGNOSIS — Z79899 Other long term (current) drug therapy: Secondary | ICD-10-CM | POA: Diagnosis not present

## 2013-01-26 DIAGNOSIS — E538 Deficiency of other specified B group vitamins: Secondary | ICD-10-CM | POA: Diagnosis not present

## 2013-02-01 ENCOUNTER — Encounter (INDEPENDENT_AMBULATORY_CARE_PROVIDER_SITE_OTHER): Payer: Medicare Other | Admitting: Surgery

## 2013-02-23 ENCOUNTER — Emergency Department (HOSPITAL_COMMUNITY): Payer: Medicare Other

## 2013-02-23 ENCOUNTER — Inpatient Hospital Stay (HOSPITAL_COMMUNITY)
Admission: EM | Admit: 2013-02-23 | Discharge: 2013-02-25 | DRG: 690 | Disposition: A | Discharge status: Home / Self Care | Payer: Medicare Other | Attending: Internal Medicine | Admitting: Internal Medicine

## 2013-02-23 ENCOUNTER — Encounter (HOSPITAL_COMMUNITY): Payer: Self-pay | Admitting: *Deleted

## 2013-02-23 DIAGNOSIS — H3530 Unspecified macular degeneration: Secondary | ICD-10-CM | POA: Diagnosis present

## 2013-02-23 DIAGNOSIS — Z961 Presence of intraocular lens: Secondary | ICD-10-CM | POA: Diagnosis not present

## 2013-02-23 DIAGNOSIS — K8020 Calculus of gallbladder without cholecystitis without obstruction: Secondary | ICD-10-CM | POA: Diagnosis not present

## 2013-02-23 DIAGNOSIS — Z8249 Family history of ischemic heart disease and other diseases of the circulatory system: Secondary | ICD-10-CM

## 2013-02-23 DIAGNOSIS — I447 Left bundle-branch block, unspecified: Secondary | ICD-10-CM

## 2013-02-23 DIAGNOSIS — Z7982 Long term (current) use of aspirin: Secondary | ICD-10-CM | POA: Diagnosis not present

## 2013-02-23 DIAGNOSIS — I509 Heart failure, unspecified: Secondary | ICD-10-CM | POA: Diagnosis present

## 2013-02-23 DIAGNOSIS — I255 Ischemic cardiomyopathy: Secondary | ICD-10-CM

## 2013-02-23 DIAGNOSIS — N390 Urinary tract infection, site not specified: Secondary | ICD-10-CM

## 2013-02-23 DIAGNOSIS — I5022 Chronic systolic (congestive) heart failure: Secondary | ICD-10-CM

## 2013-02-23 DIAGNOSIS — Z87891 Personal history of nicotine dependence: Secondary | ICD-10-CM | POA: Diagnosis not present

## 2013-02-23 DIAGNOSIS — D649 Anemia, unspecified: Secondary | ICD-10-CM | POA: Diagnosis present

## 2013-02-23 DIAGNOSIS — K219 Gastro-esophageal reflux disease without esophagitis: Secondary | ICD-10-CM | POA: Diagnosis present

## 2013-02-23 DIAGNOSIS — I10 Essential (primary) hypertension: Secondary | ICD-10-CM | POA: Diagnosis present

## 2013-02-23 DIAGNOSIS — C50919 Malignant neoplasm of unspecified site of unspecified female breast: Secondary | ICD-10-CM | POA: Diagnosis present

## 2013-02-23 DIAGNOSIS — R339 Retention of urine, unspecified: Secondary | ICD-10-CM | POA: Diagnosis not present

## 2013-02-23 DIAGNOSIS — Z7902 Long term (current) use of antithrombotics/antiplatelets: Secondary | ICD-10-CM | POA: Diagnosis not present

## 2013-02-23 DIAGNOSIS — Z79899 Other long term (current) drug therapy: Secondary | ICD-10-CM | POA: Diagnosis not present

## 2013-02-23 DIAGNOSIS — R319 Hematuria, unspecified: Secondary | ICD-10-CM

## 2013-02-23 DIAGNOSIS — I2510 Atherosclerotic heart disease of native coronary artery without angina pectoris: Secondary | ICD-10-CM | POA: Diagnosis present

## 2013-02-23 DIAGNOSIS — Z96649 Presence of unspecified artificial hip joint: Secondary | ICD-10-CM

## 2013-02-23 DIAGNOSIS — Z9849 Cataract extraction status, unspecified eye: Secondary | ICD-10-CM

## 2013-02-23 DIAGNOSIS — N3090 Cystitis, unspecified without hematuria: Principal | ICD-10-CM | POA: Diagnosis present

## 2013-02-23 DIAGNOSIS — I428 Other cardiomyopathies: Secondary | ICD-10-CM | POA: Diagnosis present

## 2013-02-23 DIAGNOSIS — N3000 Acute cystitis without hematuria: Secondary | ICD-10-CM | POA: Diagnosis not present

## 2013-02-23 DIAGNOSIS — N289 Disorder of kidney and ureter, unspecified: Secondary | ICD-10-CM | POA: Diagnosis not present

## 2013-02-23 DIAGNOSIS — M549 Dorsalgia, unspecified: Secondary | ICD-10-CM

## 2013-02-23 DIAGNOSIS — M1710 Unilateral primary osteoarthritis, unspecified knee: Secondary | ICD-10-CM | POA: Diagnosis present

## 2013-02-23 DIAGNOSIS — E538 Deficiency of other specified B group vitamins: Secondary | ICD-10-CM | POA: Diagnosis present

## 2013-02-23 DIAGNOSIS — Z950 Presence of cardiac pacemaker: Secondary | ICD-10-CM

## 2013-02-23 DIAGNOSIS — Z853 Personal history of malignant neoplasm of breast: Secondary | ICD-10-CM | POA: Diagnosis not present

## 2013-02-23 DIAGNOSIS — M216X9 Other acquired deformities of unspecified foot: Secondary | ICD-10-CM

## 2013-02-23 DIAGNOSIS — M1990 Unspecified osteoarthritis, unspecified site: Secondary | ICD-10-CM

## 2013-02-23 DIAGNOSIS — R609 Edema, unspecified: Secondary | ICD-10-CM

## 2013-02-23 DIAGNOSIS — Z9861 Coronary angioplasty status: Secondary | ICD-10-CM

## 2013-02-23 DIAGNOSIS — I2589 Other forms of chronic ischemic heart disease: Secondary | ICD-10-CM | POA: Diagnosis not present

## 2013-02-23 DIAGNOSIS — K429 Umbilical hernia without obstruction or gangrene: Secondary | ICD-10-CM

## 2013-02-23 DIAGNOSIS — R3129 Other microscopic hematuria: Secondary | ICD-10-CM | POA: Diagnosis not present

## 2013-02-23 DIAGNOSIS — Z9010 Acquired absence of unspecified breast and nipple: Secondary | ICD-10-CM

## 2013-02-23 DIAGNOSIS — R300 Dysuria: Secondary | ICD-10-CM | POA: Diagnosis not present

## 2013-02-23 HISTORY — DX: Hematuria, unspecified: R31.9

## 2013-02-23 LAB — URINE MICROSCOPIC-ADD ON

## 2013-02-23 LAB — URINALYSIS, ROUTINE W REFLEX MICROSCOPIC
Bilirubin Urine: NEGATIVE
Glucose, UA: NEGATIVE mg/dL
Ketones, ur: NEGATIVE mg/dL
Nitrite: NEGATIVE
Protein, ur: >300 mg/dL — AB
Specific Gravity, Urine: 1.033 — ABNORMAL HIGH (ref 1.005–1.030)
Urobilinogen, UA: 0.2 mg/dL (ref 0.0–1.0)
pH: 7 (ref 5.0–8.0)

## 2013-02-23 LAB — CBC WITH DIFFERENTIAL/PLATELET
Basophils Absolute: 0 10*3/uL (ref 0.0–0.1)
Basophils Relative: 0 % (ref 0–1)
Eosinophils Absolute: 0.1 10*3/uL (ref 0.0–0.7)
Eosinophils Relative: 1 % (ref 0–5)
HCT: 31.8 % — ABNORMAL LOW (ref 36.0–46.0)
Hemoglobin: 10.6 g/dL — ABNORMAL LOW (ref 12.0–15.0)
Lymphocytes Relative: 9 % — ABNORMAL LOW (ref 12–46)
Lymphs Abs: 0.9 10*3/uL (ref 0.7–4.0)
MCH: 31 pg (ref 26.0–34.0)
MCHC: 33.3 g/dL (ref 30.0–36.0)
MCV: 93 fL (ref 78.0–100.0)
Monocytes Absolute: 0.8 10*3/uL (ref 0.1–1.0)
Monocytes Relative: 7 % (ref 3–12)
Neutro Abs: 9.1 10*3/uL — ABNORMAL HIGH (ref 1.7–7.7)
Neutrophils Relative %: 83 % — ABNORMAL HIGH (ref 43–77)
Platelets: 248 10*3/uL (ref 150–400)
RBC: 3.42 MIL/uL — ABNORMAL LOW (ref 3.87–5.11)
RDW: 14.1 % (ref 11.5–15.5)
WBC: 10.9 10*3/uL — ABNORMAL HIGH (ref 4.0–10.5)

## 2013-02-23 LAB — COMPREHENSIVE METABOLIC PANEL
ALT: 8 U/L (ref 0–35)
AST: 11 U/L (ref 0–37)
Albumin: 3.3 g/dL — ABNORMAL LOW (ref 3.5–5.2)
Alkaline Phosphatase: 89 U/L (ref 39–117)
BUN: 25 mg/dL — ABNORMAL HIGH (ref 6–23)
CO2: 26 mEq/L (ref 19–32)
Calcium: 8.9 mg/dL (ref 8.4–10.5)
Chloride: 104 mEq/L (ref 96–112)
Creatinine, Ser: 0.94 mg/dL (ref 0.50–1.10)
GFR calc Af Amer: 61 mL/min — ABNORMAL LOW (ref >90)
GFR calc non Af Amer: 53 mL/min — ABNORMAL LOW (ref >90)
Glucose, Bld: 114 mg/dL — ABNORMAL HIGH (ref 70–99)
Potassium: 3.7 mEq/L (ref 3.5–5.1)
Sodium: 139 mEq/L (ref 135–145)
Total Bilirubin: 0.5 mg/dL (ref 0.3–1.2)
Total Protein: 5.6 g/dL — ABNORMAL LOW (ref 6.0–8.3)

## 2013-02-23 LAB — HEMOGLOBIN AND HEMATOCRIT, BLOOD
HCT: 29.9 % — ABNORMAL LOW (ref 36.0–46.0)
Hemoglobin: 9.9 g/dL — ABNORMAL LOW (ref 12.0–15.0)

## 2013-02-23 LAB — PROTIME-INR
INR: 1.05 (ref 0.00–1.49)
Prothrombin Time: 13.5 seconds (ref 11.6–15.2)

## 2013-02-23 MED ORDER — MORPHINE SULFATE 4 MG/ML IJ SOLN
4.0000 mg | Freq: Once | INTRAMUSCULAR | Status: AC
  Administered 2013-02-23: 4 mg via INTRAVENOUS

## 2013-02-23 MED ORDER — ONDANSETRON HCL 4 MG/2ML IJ SOLN: 4.0000 mg | Freq: Three times a day (TID) | INTRAMUSCULAR | Status: DC | PRN

## 2013-02-23 MED ORDER — ONDANSETRON HCL 4 MG/2ML IJ SOLN: 4.0000 mg | Freq: Four times a day (QID) | INTRAMUSCULAR | Status: DC | PRN

## 2013-02-23 MED ORDER — SODIUM CHLORIDE 0.9 % IJ SOLN
3.0000 mL | Freq: Two times a day (BID) | INTRAMUSCULAR | Status: DC
  Administered 2013-02-24: 3 mL via INTRAVENOUS

## 2013-02-23 MED ORDER — MORPHINE SULFATE 4 MG/ML IJ SOLN
INTRAMUSCULAR | Status: AC
  Filled 2013-02-23: qty 1

## 2013-02-23 MED ORDER — ONDANSETRON HCL 4 MG PO TABS: 4.0000 mg | ORAL_TABLET | Freq: Four times a day (QID) | ORAL | Status: DC | PRN

## 2013-02-23 MED ORDER — RAMIPRIL 2.5 MG PO CAPS
2.5000 mg | ORAL_CAPSULE | Freq: Every morning | ORAL | Status: DC
  Administered 2013-02-24: 2.5 mg via ORAL
  Filled 2013-02-23 (×2): qty 1

## 2013-02-23 MED ORDER — CIPROFLOXACIN IN D5W 400 MG/200ML IV SOLN
400.0000 mg | Freq: Once | INTRAVENOUS | Status: AC
  Administered 2013-02-23: 400 mg via INTRAVENOUS
  Filled 2013-02-23: qty 200

## 2013-02-23 MED ORDER — CARVEDILOL 3.125 MG PO TABS
3.1250 mg | ORAL_TABLET | Freq: Two times a day (BID) | ORAL | Status: DC
Start: 2013-02-24 — End: 2013-02-25
  Administered 2013-02-24 – 2013-02-25 (×3): 3.125 mg via ORAL
  Filled 2013-02-23 (×5): qty 1

## 2013-02-23 MED ORDER — SIMVASTATIN 10 MG PO TABS
10.0000 mg | ORAL_TABLET | Freq: Every day | ORAL | Status: DC
  Administered 2013-02-23 – 2013-02-24 (×2): 10 mg via ORAL
  Filled 2013-02-23 (×3): qty 1

## 2013-02-23 MED ORDER — FUROSEMIDE 20 MG PO TABS
20.0000 mg | ORAL_TABLET | Freq: Every day | ORAL | Status: DC
  Administered 2013-02-23 – 2013-02-24 (×2): 20 mg via ORAL
  Filled 2013-02-23 (×3): qty 1

## 2013-02-23 MED ORDER — IOHEXOL 300 MG/ML  SOLN
25.0000 mL | INTRAMUSCULAR | Status: AC
  Administered 2013-02-23 (×2): 25 mL via ORAL

## 2013-02-23 MED ORDER — ACETAMINOPHEN 325 MG PO TABS: 650.0000 mg | ORAL_TABLET | Freq: Four times a day (QID) | ORAL | Status: DC | PRN

## 2013-02-23 MED ORDER — SODIUM CHLORIDE 0.9 % IV SOLN
INTRAVENOUS | Status: AC
  Administered 2013-02-23: 20:00:00 via INTRAVENOUS

## 2013-02-23 MED ORDER — DEXTROSE 5 % IV SOLN
1.0000 g | INTRAVENOUS | Status: DC
  Administered 2013-02-23 – 2013-02-24 (×2): 1 g via INTRAVENOUS
  Filled 2013-02-23 (×3): qty 10

## 2013-02-23 MED ORDER — ISOSORBIDE MONONITRATE ER 60 MG PO TB24
90.0000 mg | ORAL_TABLET | Freq: Every day | ORAL | Status: DC
  Administered 2013-02-23 – 2013-02-24 (×2): 90 mg via ORAL
  Filled 2013-02-23 (×4): qty 1

## 2013-02-23 MED ORDER — ACETAMINOPHEN 650 MG RE SUPP: 650.0000 mg | Freq: Four times a day (QID) | RECTAL | Status: DC | PRN

## 2013-02-23 NOTE — ED Notes (Signed)
Foley cath irrigated, post irrigation foley cath tube free of clots and flowing freely, patient tolerated procedure well

## 2013-02-23 NOTE — Progress Notes (Signed)
Called for report, RN is with another patient.

## 2013-02-23 NOTE — ED Notes (Signed)
Foley cath irrigated, additional blood clots removed, bladder scan post irrigation shows 150 ml residual, Rancour, MD notified

## 2013-02-23 NOTE — H&P (Signed)
[facility] History and Physical  [name]:[id number] DOB: [birth year] DOA: 02/23/2013  Referring physician: er PCP: Thora Lance, MD  Specialists: Dr. Annabell Howells  Chief Complaint:   HPI: [name] is a 79 y.o. female  Who comes in with 1 day h/o burning with urination and blood in urine.  She has had frequency and urgency.  No fever, no chills.  No constipation.  No new meds- had diarrhea 1 week ago and took a few lomotil but not in the last week.    In the ER, a foley was placed and she drained clots.  A CT scan was done that showed no renal issues but bladder was mod distended with clots.  She was seen by urology in the ER who recommended admission to medicine overnight to begin antibiotic therapy and monitor for recurrent bleeding:  -Hold antiplatelet therapy if safe to do so.  -Urine culture  -broad spectrum antibiotic with good GNR coverage.  -She will need cystoscopy in the office at a later date unless the bleeding recurs and more immediate intervention is needed.  -NS irrigation prn.    Review of Systems: all systems reviewed, negative unless stated above   Past Medical History  Diagnosis Date  . Macular degeneration   . Mild mitral regurgitation     Mild TR   . CAD (coronary artery disease)     w/ DES to LAD with residual diagonal disease to small for PCI  . Osteopenia   . LVF (left ventricular failure)     Mild, w/ PASP  . Anginal pain   . Exertional dyspnea     "before stent in 2010" (05/27/2012)  . Vitamin B12 deficiency   . Osteoarthritis     "right knee" (05/27/2012)  . Breast cancer     bilaterally  . Cardiomyopathy     Mixed CM; s/p [employer] CRT-P implant 10/24 w/ LV lead revision 10/25   . LBBB (left bundle branch block)   . Chronic systolic CHF (congestive heart failure)   . Hypertension   . Pacemaker   . GERD (gastroesophageal reflux disease)     occ  . Umbilical hernia 10/22/2012    Repaired on 6914    Past Surgical  History  Procedure Laterality Date  . Coronary angioplasty with stent placement  2010    "1" (05/27/2012)  . Total hip arthroplasty  2011    right  . Mastectomy  1995    bilaterally  . Breast biopsy  1995    bilaterally  . Cataract extraction w/ intraocular lens  implant, bilateral  1990's  . Insert / replace / remove pacemaker  05/27/2012    initial placement  . Insert / replace / remove pacemaker  05/31/2012    "lead change/repaired" (05/31/2012)  . Joint replacement    . Tonsillectomy    . Umbilical hernia repair N/A 01/10/2013    Procedure: HERNIA REPAIR UMBILICAL ADULT;  Surgeon: Currie Paris, MD;  Location: [facility] OR;  Service: General;  Laterality: N/A;  . Insertion of mesh N/A 01/10/2013    Procedure: INSERTION OF MESH;  Surgeon: Currie Paris, MD;  Location: [facility] OR;  Service: General;  Laterality: N/A;   Social History:  reports that she quit smoking about 50 years ago. Her smoking use included Cigarettes. She has a 27 pack-year smoking history. She has never used smokeless tobacco. She reports that she drinks about 3.0 ounces of alcohol per week. She reports that she does  not use illicit drugs.  No Known Allergies  Family History  Problem Relation Age of Onset  . Heart disease Mother   . Heart disease Father     Prior to Admission medications   Medication Sig Start Date End Date Taking? Authorizing Provider  alendronate (FOSAMAX) 70 MG tablet Take 70 mg by mouth every 7 (seven) days. Take in the morning with a full glass of water, on an empty stomach, and do not take anything else by mouth or lie down for the next 30 min. On Wednesday   Yes Historical Provider, MD  ascorbic acid (VITAMIN C) 500 MG tablet Take 500 mg by mouth daily.   Yes Historical Provider, MD  aspirin EC 81 MG tablet Take 81 mg by mouth daily.   Yes Historical Provider, MD  carvedilol (COREG) 3.125 MG tablet Take 3.125 mg by mouth 2 (two) times daily with a meal.   Yes Historical Provider, MD   Cholecalciferol (VITAMIN D-3) 5000 UNITS TABS Take 5,000 Units by mouth daily.    Yes Historical Provider, MD  clopidogrel (PLAVIX) 75 MG tablet Take 75 mg by mouth daily.     Yes Historical Provider, MD  furosemide (LASIX) 20 MG tablet Take 20 mg by mouth at bedtime.    Yes Historical Provider, MD  isosorbide mononitrate (IMDUR) 60 MG 24 hr tablet Take 90 mg by mouth daily.    Yes Historical Provider, MD  Multiple Vitamins-Minerals (OCUVITE PRESERVISION PO) Take 1 capsule by mouth 2 (two) times daily.   Yes Historical Provider, MD  ramipril (ALTACE) 2.5 MG capsule Take 2.5 mg by mouth every morning.    Yes Historical Provider, MD  simvastatin (ZOCOR) 10 MG tablet Take 10 mg by mouth at bedtime.    Yes Historical Provider, MD  traMADol (ULTRAM) 50 MG tablet Take 100 mg by mouth See admin instructions. Take two tablets in the morning, and if needed, two more in the evening for pain.   Yes Historical Provider, MD  nitroGLYCERIN (NITROSTAT) 0.4 MG SL tablet Place 0.4 mg under the tongue every 5 (five) minutes as needed for chest pain.     Historical Provider, MD   Physical Exam: Filed Vitals:   02/23/13 0945 02/23/13 1154 02/23/13 1338 02/23/13 1531  BP: 170/65 156/62 140/48 153/69  Pulse: 93  78 75  Temp:      TempSrc:      Resp:  18 14 20   Height:      Weight:      SpO2: 96% 98% 98% 98%     General:  A+Ox3, NAd  Eyes: wnl  ENT: wnl  Neck: supple  Cardiovascular: rrr  Respiratory: clear, no wheezing  Abdomen: +BS, soft, mild suprapubic pain  Musculoskeletal: moves all 4 ext- bruising on LE  Psychiatric: normal mood and affect  Neurologic: no focal deficit  Labs on Admission:  Basic Metabolic Panel:  Recent Labs Lab 02/23/13 1003  NA 139  K 3.7  CL 104  CO2 26  GLUCOSE 114*  BUN 25*  CREATININE 0.94  CALCIUM 8.9   Liver Function Tests:  Recent Labs Lab 02/23/13 1003  AST 11  ALT 8  ALKPHOS 89  BILITOT 0.5  PROT 5.6*  ALBUMIN 3.3*   No results  found for this basename: LIPASE, AMYLASE,  in the last 168 hours No results found for this basename: AMMONIA,  in the last 168 hours CBC:  Recent Labs Lab 02/23/13 1003  WBC 10.9*  NEUTROABS 9.1*  HGB 10.6*  HCT 31.8*  MCV 93.0  PLT 248   Cardiac Enzymes: No results found for this basename: CKTOTAL, CKMB, CKMBINDEX, TROPONINI,  in the last 168 hours  BNP (last 3 results) No results found for this basename: PROBNP,  in the last 8760 hours CBG: No results found for this basename: GLUCAP,  in the last 168 hours  Radiological Exams on Admission: Ct Abdomen Pelvis Wo Contrast  02/23/2013   *RADIOLOGY REPORT*  Clinical Data: Difficulty voiding for 1 day with pressure and dysuria and hematuria.  CT ABDOMEN AND PELVIS WITHOUT CONTRAST  Technique:  Multidetector CT imaging of the abdomen and pelvis was performed following the standard protocol without intravenous contrast.  Comparison: 07/23/2005  Findings: There is an area of reticular opacity in the lingular segment of the left upper lobe that is incompletely imaged, but is new from prior study.  It is likely scarring or atelectasis.  It is not mass-like.  There is some minimal reticular opacity at both lung bases that is likely scarring.  The heart is normal in size.  Normal liver and spleen.  The gallbladder is distended with multiple stones.  There is no wall thickening or pericholecystic inflammation to suggest acute cholecystitis.  No bile duct dilation.  Normal pancreas.  Mild adrenal nodularity is stable from the prior exam.  Bilateral renal cortical thinning.  No renal masses or stones.  No hydronephrosis.  Normal ureters.  The contents of the bladder are relatively hyperattenuating averaging 70.5 HU.  This may reflect some dilute contrast.  It could be due to hemorrhage.  Nondependent air is noted in the bladder.  There is a Foley catheter that has been inserted into the bladder and thus nondependent air is presumed to be iatrogenic.  The  uterus and adnexa are unremarkable.  No pathologically enlarged lymph nodes.  No abnormal fluid collections.  There is extensive colonic diverticulosis mostly along the left colon.  No diverticulitis or evidence of bowel inflammation.  No obstruction.  A normal appendix is visualized.  There are significant degenerative changes of the visualized spine. A right hip prosthesis is well seated and aligned.  No osteoblastic or osteolytic lesions.  IMPRESSION: No acute findings.  No renal masses or stones or hydronephrosis. There is bilateral renal cortical thinning.  The fluid within the bladder is relatively hyperattenuating.  This could be from contrast.  It could reflect hemorrhage.  There is nondependent air in the bladder reflecting the recent Foley catheter placement.  Areas of lung scarring.  Multiple gallstones without evidence of acute cholecystitis.  Extensive colonic diverticulosis mostly of the left colon.  Degenerative changes noted of the visualized spine.   Original Report Authenticated By: Amie Portland, M.D.      Assessment/Plan Active Problems:   UTI (lower urinary tract infection)   Urinary retention   Hematuria   1. Hematuria with clots- urology consult; hold ASA/plavix- need urology to clarify how long they would like for her to be off 2. UTI- rocephin until culture back 3. Urinary retention- foley 4. Anemia- monitor with CBC in AM 5. Leukocytosis- monitor  urology  Code Status: full Family Communication: husband/daughter Disposition Plan: obs  Time spent: 70 min  Briyanna Billingham [facility] Pager [phone]  If 7PM-7AM, please contact night-coverage [email or web address] Password [facility] 02/23/2013, 4:06 PM

## 2013-02-23 NOTE — Consult Note (Signed)
Subjective: I was asked to see [name] in [place] by Dr. Manus Gunning for clot retention.   She is the 79yo wife of the retired Dr. Lanier Chang.  She has no prior urologic history.  She had the onset yesterday of dysuria and had blood in the urine this morning that was followed by the inability to void.  She is on Plavix and ASA for cardiac issues.   She had a 66fr foley passed and it drained about 300cc but couldn't be irrigated.   A CT without contrast showed no renal issues or ureteral stones, but the bladder was moderately distended with high density material consistent with clot.  She had no associated signs or symptoms.   ROS: Negative except as above on a 12 point review.  She has had no pain or fever.  Past Medical History  Diagnosis Date  . Macular degeneration   . Mild mitral regurgitation     Mild TR   . CAD (coronary artery disease)     w/ DES to LAD with residual diagonal disease to small for PCI  . Osteopenia   . LVF (left ventricular failure)     Mild, w/ PASP  . Anginal pain   . Exertional dyspnea     "before stent in 2010" (05/27/2012)  . Vitamin B12 deficiency   . Osteoarthritis     "right knee" (05/27/2012)  . Breast cancer     bilaterally  . Cardiomyopathy     Mixed CM; s/p St. Jude CRT-P implant 10/24 w/ LV lead revision 10/25   . LBBB (left bundle branch block)   . Chronic systolic CHF (congestive heart failure)   . Hypertension   . Pacemaker   . GERD (gastroesophageal reflux disease)     occ  . Umbilical hernia 10/22/2012    Repaired on 6914    Past Surgical History  Procedure Laterality Date  . Coronary angioplasty with stent placement  2010    "1" (05/27/2012)  . Total hip arthroplasty  2011    right  . Mastectomy  1995    bilaterally  . Breast biopsy  1995    bilaterally  . Cataract extraction w/ intraocular lens  implant, bilateral  1990's  . Insert / replace / remove pacemaker  05/27/2012    initial placement  . Insert / replace  / remove pacemaker  05/31/2012    "lead change/repaired" (05/31/2012)  . Joint replacement    . Tonsillectomy    . Umbilical hernia repair N/A 01/10/2013    Procedure: HERNIA REPAIR UMBILICAL ADULT;  Surgeon: Currie Paris, MD;  Location: [facility] OR;  Service: General;  Laterality: N/A;  . Insertion of mesh N/A 01/10/2013    Procedure: INSERTION OF MESH;  Surgeon: Currie Paris, MD;  Location: [facility] OR;  Service: General;  Laterality: N/A;   History   Social History  . Marital Status: Married    Spouse Name: N/A    Number of Children: N/A  . Years of Education: N/A   Occupational History  . Not on file.   Social History Main Topics  . Smoking status: Former Smoker -- 1.00 packs/day for 27 years    Types: Cigarettes    Quit date: 01/05/1963  . Smokeless tobacco: Never Used     Comment: 05/27/2012 "stopped smoking ~ 46 yr ago"  . Alcohol Use: 3.0 oz/week    5 Glasses of wine per week     Comment: 05/27/2012 "glass of wine ~  5X/wk":  . Drug Use: No  . Sexually Active: Not Currently   Other Topics Concern  . Not on file   Social History Narrative  . No narrative on file   Family History  Problem Relation Age of Onset  . Heart disease Mother   . Heart disease Father   No Known Allergies  Objective: Vital signs in last 24 hours: Temp:  [97.5 F (36.4 C)] 97.5 F (36.4 C) (07/23 0915) Pulse Rate:  [78-93] 78 (07/23 1338) Resp:  [14-18] 14 (07/23 1338) BP: (140-170)/(48-81) 140/48 mmHg (07/23 1338) SpO2:  [96 %-98 %] 98 % (07/23 1338) Weight:  [81.647 kg (180 lb)] 81.647 kg (180 lb) (07/23 0924)  Intake/Output from previous day:   Intake/Output this shift:    General appearance: alert and no distress Head: Normocephalic, without obvious abnormality, atraumatic Neck: no adenopathy, no JVD and supple, symmetrical, trachea midline Resp: clear to auscultation bilaterally Cardio: regular rate and rhythm GI: soft, non-tender; bowel sounds normal; no masses,  no  organomegaly Pelvic: external genitalia normal, vagina normal without discharge and bladder is non-tender but feels full and there is a foley at the meatus with blood in the tubing.  Extremities: edema moderate to the upper legs.  Skin: Skin color, texture, turgor normal. No rashes or lesions Lymph nodes: No Supraclavicular, axillary or inguinal adenopathy is noted.  Neurologic: Mental status: Alert, oriented, thought content appropriate Sensory: normal  Lab Results:   Recent Labs  02/23/13 1003  WBC 10.9*  HGB 10.6*  HCT 31.8*  PLT 248   BMET  Recent Labs  02/23/13 1003  NA 139  K 3.7  CL 104  CO2 26  GLUCOSE 114*  BUN 25*  CREATININE 0.94  CALCIUM 8.9   PT/INR  Recent Labs  02/23/13 1049  LABPROT 13.5  INR 1.05   ABG No results found for this basename: PHART, PCO2, PO2, HCO3,  in the last 72 hours  Studies/Results: Ct Abdomen Pelvis Wo Contrast  02/23/2013   *RADIOLOGY REPORT*  Clinical Data: Difficulty voiding for 1 day with pressure and dysuria and hematuria.  CT ABDOMEN AND PELVIS WITHOUT CONTRAST  Technique:  Multidetector CT imaging of the abdomen and pelvis was performed following the standard protocol without intravenous contrast.  Comparison: 07/23/2005  Findings: There is an area of reticular opacity in the lingular segment of the left upper lobe that is incompletely imaged, but is new from prior study.  It is likely scarring or atelectasis.  It is not mass-like.  There is some minimal reticular opacity at both lung bases that is likely scarring.  The heart is normal in size.  Normal liver and spleen.  The gallbladder is distended with multiple stones.  There is no wall thickening or pericholecystic inflammation to suggest acute cholecystitis.  No bile duct dilation.  Normal pancreas.  Mild adrenal nodularity is stable from the prior exam.  Bilateral renal cortical thinning.  No renal masses or stones.  No hydronephrosis.  Normal ureters.  The contents of the  bladder are relatively hyperattenuating averaging 70.5 HU.  This may reflect some dilute contrast.  It could be due to hemorrhage.  Nondependent air is noted in the bladder.  There is a Foley catheter that has been inserted into the bladder and thus nondependent air is presumed to be iatrogenic.  The uterus and adnexa are unremarkable.  No pathologically enlarged lymph nodes.  No abnormal fluid collections.  There is extensive colonic diverticulosis mostly along the left colon.  No diverticulitis or evidence of bowel inflammation.  No obstruction.  A normal appendix is visualized.  There are significant degenerative changes of the visualized spine. A right hip prosthesis is well seated and aligned.  No osteoblastic or osteolytic lesions.  IMPRESSION: No acute findings.  No renal masses or stones or hydronephrosis. There is bilateral renal cortical thinning.  The fluid within the bladder is relatively hyperattenuating.  This could be from contrast.  It could reflect hemorrhage.  There is nondependent air in the bladder reflecting the recent Foley catheter placement.  Areas of lung scarring.  Multiple gallstones without evidence of acute cholecystitis.  Extensive colonic diverticulosis mostly of the left colon.  Degenerative changes noted of the visualized spine.   Original Report Authenticated By: Amie Portland, M.D.    Anti-infectives: Anti-infectives   None      Current Facility-Administered Medications  Medication Dose Route Frequency Provider Last Rate Last Dose  . morphine 4 MG/ML injection 4 mg  4 mg Intravenous Once Monica Octave, MD      . morphine 4 MG/ML injection            Current Outpatient Prescriptions  Medication Sig Dispense Refill  . alendronate (FOSAMAX) 70 MG tablet Take 70 mg by mouth every 7 (seven) days. Take in the morning with a full glass of water, on an empty stomach, and do not take anything else by mouth or lie down for the next 30 min. On Wednesday      . ascorbic acid  (VITAMIN C) 500 MG tablet Take 500 mg by mouth daily.      Marland Kitchen aspirin EC 81 MG tablet Take 81 mg by mouth daily.      . carvedilol (COREG) 3.125 MG tablet Take 3.125 mg by mouth 2 (two) times daily with a meal.      . Cholecalciferol (VITAMIN D-3) 5000 UNITS TABS Take 5,000 Units by mouth daily.       . clopidogrel (PLAVIX) 75 MG tablet Take 75 mg by mouth daily.        . furosemide (LASIX) 20 MG tablet Take 20 mg by mouth at bedtime.       . isosorbide mononitrate (IMDUR) 60 MG 24 hr tablet Take 90 mg by mouth daily.       . Multiple Vitamins-Minerals (OCUVITE PRESERVISION PO) Take 1 capsule by mouth 2 (two) times daily.      . ramipril (ALTACE) 2.5 MG capsule Take 2.5 mg by mouth every morning.       . simvastatin (ZOCOR) 10 MG tablet Take 10 mg by mouth at bedtime.       . traMADol (ULTRAM) 50 MG tablet Take 100 mg by mouth See admin instructions. Take two tablets in the morning, and if needed, two more in the evening for pain.      . nitroGLYCERIN (NITROSTAT) 0.4 MG SL tablet Place 0.4 mg under the tongue every 5 (five) minutes as needed for chest pain.        I have reviewed her labs and viewed her CT.  I discussed the case with Dr. Manus Gunning.   Procedure:  The 14fr foley was removed and the genitalia was prepped with betadine.  A 26fr foley was placed but required digital guidance because of introital stenosis and initially didn't go in sufficiently and there was pain with balloon inflation.  The balloon was taken down and the foley advanced.  The balloon subsequently inflated without pain.  The foley was then irrigated and aspirated with several hundred cc of clot being returned.  She was irrigated with 1000cc of NS with eventual clear return.  Assessment: Clot retention with probable hemorrhagic cystitis aggravated by antiplatelet therapy.   Active bleeding appears to resolve.   Plan: Hold antiplatelet therapy if safe to do so. Urine culture pending. Begin broad spectrum antibiotic  with good GNR coverage. She will need cystoscopy in the office at a later date unless the bleeding recurs and more immediate intervention is needed.  I would suggest admission to medicine overnight to begin antibiotic therapy and monitor for recurrent bleeding. NS irrigation prn.   CC: Dr. Viviann Spare Rancour.     LOS: 0 days    Anner Crete 02/23/2013

## 2013-02-23 NOTE — ED Provider Notes (Signed)
History    CSN: [id number] Arrival date & time 02/23/13  0906  First MD Initiated Contact with Patient 02/23/13 [phone]     Chief Complaint  Patient presents with  . Urinary Retention   (Consider location/radiation/quality/duration/timing/severity/associated sxs/prior Treatment) HPI Comments: Patient presents with dysuria and hematuria for the past one day. States that she woke up yesterday morning and had worsening with urination and noticed some "clots" in her urine. She frequency urgency throughout the day. She is now only able to void small amounts and feels like he has to urinate constantly. Denies any fevers, chills, nausea, vomiting or rash. No chest pain or shortness of breath. She is on Plavix but not Coumadin. Denies any previous urinary issues. Denies any back pain or abdominal pain. She has a history of breast cancer status post resection. No weakness, numbness or tingling in her legs.  The history is provided by the patient and a relative.   Past Medical History  Diagnosis Date  . Macular degeneration   . Mild mitral regurgitation     Mild TR   . CAD (coronary artery disease)     w/ DES to LAD with residual diagonal disease to small for PCI  . Osteopenia   . LVF (left ventricular failure)     Mild, w/ PASP  . Anginal pain   . Exertional dyspnea     "before stent in 2010" (05/27/2012)  . Vitamin B12 deficiency   . Osteoarthritis     "right knee" (05/27/2012)  . Breast cancer     bilaterally  . Cardiomyopathy     Mixed CM; s/p St. Jude CRT-P implant 10/24 w/ LV lead revision 10/25   . LBBB (left bundle branch block)   . Chronic systolic CHF (congestive heart failure)   . Hypertension   . Pacemaker   . GERD (gastroesophageal reflux disease)     occ  . Umbilical hernia 10/22/2012    Repaired on 6914    Past Surgical History  Procedure Laterality Date  . Coronary angioplasty with stent placement  2010    "1" (05/27/2012)  . Total hip arthroplasty  2011     right  . Mastectomy  1995    bilaterally  . Breast biopsy  1995    bilaterally  . Cataract extraction w/ intraocular lens  implant, bilateral  1990's  . Insert / replace / remove pacemaker  05/27/2012    initial placement  . Insert / replace / remove pacemaker  05/31/2012    "lead change/repaired" (05/31/2012)  . Joint replacement    . Tonsillectomy    . Umbilical hernia repair N/A 01/10/2013    Procedure: HERNIA REPAIR UMBILICAL ADULT;  Surgeon: Currie Paris, MD;  Location: [facility] OR;  Service: General;  Laterality: N/A;  . Insertion of mesh N/A 01/10/2013    Procedure: INSERTION OF MESH;  Surgeon: Currie Paris, MD;  Location: [facility] OR;  Service: General;  Laterality: N/A;   Family History  Problem Relation Age of Onset  . Heart disease Mother   . Heart disease Father    History  Substance Use Topics  . Smoking status: Former Smoker -- 1.00 packs/day for 27 years    Types: Cigarettes    Quit date: 01/05/1963  . Smokeless tobacco: Never Used     Comment: 05/27/2012 "stopped smoking ~ 46 yr ago"  . Alcohol Use: 3.0 oz/week    5 Glasses of wine per week     Comment: 05/27/2012 "glass of  wine ~ 5X/wk":   OB History   Grav Para Term Preterm Abortions TAB SAB Ect Mult Living                 Review of Systems  Constitutional: Negative for fever, activity change and appetite change.  HENT: Negative for congestion and rhinorrhea.   Respiratory: Negative for chest tightness.   Cardiovascular: Negative for chest pain.  Gastrointestinal: Positive for abdominal pain. Negative for nausea.  Genitourinary: Positive for dysuria, urgency, frequency, hematuria and difficulty urinating. Negative for flank pain, vaginal bleeding and vaginal discharge.  Musculoskeletal: Negative for back pain.  Skin: Negative for rash.  Neurological: Negative for dizziness, weakness and headaches.  A complete 10 system review of systems was obtained and all systems are negative except as noted in the HPI  and PMH.    Allergies  Review of patient's allergies indicates no known allergies.  Home Medications   Current Outpatient Rx  Name  Route  Sig  Dispense  Refill  . alendronate (FOSAMAX) 70 MG tablet   Oral   Take 70 mg by mouth every 7 (seven) days. Take in the morning with a full glass of water, on an empty stomach, and do not take anything else by mouth or lie down for the next 30 min. On Wednesday         . ascorbic acid (VITAMIN C) 500 MG tablet   Oral   Take 500 mg by mouth daily.         Marland Kitchen aspirin EC 81 MG tablet   Oral   Take 81 mg by mouth daily.         . carvedilol (COREG) 3.125 MG tablet   Oral   Take 3.125 mg by mouth 2 (two) times daily with a meal.         . Cholecalciferol (VITAMIN D-3) 5000 UNITS TABS   Oral   Take 5,000 Units by mouth daily.          . clopidogrel (PLAVIX) 75 MG tablet   Oral   Take 75 mg by mouth daily.           . furosemide (LASIX) 20 MG tablet   Oral   Take 20 mg by mouth at bedtime.          . isosorbide mononitrate (IMDUR) 60 MG 24 hr tablet   Oral   Take 90 mg by mouth daily.          . Multiple Vitamins-Minerals (OCUVITE PRESERVISION PO)   Oral   Take 1 capsule by mouth 2 (two) times daily.         . ramipril (ALTACE) 2.5 MG capsule   Oral   Take 2.5 mg by mouth every morning.          . simvastatin (ZOCOR) 10 MG tablet   Oral   Take 10 mg by mouth at bedtime.          . traMADol (ULTRAM) 50 MG tablet   Oral   Take 100 mg by mouth See admin instructions. Take two tablets in the morning, and if needed, two more in the evening for pain.         . nitroGLYCERIN (NITROSTAT) 0.4 MG SL tablet   Sublingual   Place 0.4 mg under the tongue every 5 (five) minutes as needed for chest pain.           BP 153/69  Pulse 75  Temp(Src) 97.5 F (36.4 C) (Oral)  Resp 20  Ht 5\' 5"  (1.651 m)  Wt 180 lb (81.647 kg)  BMI 29.95 kg/m2  SpO2 98% Physical Exam  Constitutional: She is oriented to person,  place, and time. She appears well-developed and well-nourished. No distress.  HENT:  Head: Normocephalic and atraumatic.  Mouth/Throat: Oropharynx is clear and moist. No oropharyngeal exudate.  Eyes: Conjunctivae and EOM are normal. Pupils are equal, round, and reactive to light.  Neck: Normal range of motion. Neck supple.  Cardiovascular: Normal rate, regular rhythm and normal heart sounds.   No murmur heard. Pulmonary/Chest: Effort normal and breath sounds normal. No respiratory distress.  Abdominal: Soft. There is tenderness. There is no rebound and no guarding.  Suprapubic tenderness  Genitourinary:  BRB from vaginal area on inspection. Palpable bladder. No rash Chaperone present  Musculoskeletal: Normal range of motion. She exhibits no edema and no tenderness.  No CVAT 5/5 strength in bilateral lower extremities. Ankle plantar and dorsiflexion intact. Great toe extension intact bilaterally. +2 DP and PT pulses. +2 patellar reflexes bilaterally. Normal gait.   Neurological: She is alert and oriented to person, place, and time. No cranial nerve deficit. She exhibits normal muscle tone. Coordination normal.  Skin: Skin is warm.    ED Course  Procedures (including critical care time) Labs Reviewed  URINALYSIS, ROUTINE W REFLEX MICROSCOPIC - Abnormal; Notable for the following:    Color, Urine RED (*)    APPearance CLOUDY (*)    Specific Gravity, Urine 1.033 (*)    Hgb urine dipstick LARGE (*)    Protein, ur >300 (*)    Leukocytes, UA SMALL (*)    All other components within normal limits  CBC WITH DIFFERENTIAL - Abnormal; Notable for the following:    WBC 10.9 (*)    RBC 3.42 (*)    Hemoglobin 10.6 (*)    HCT 31.8 (*)    Neutrophils Relative % 83 (*)    Neutro Abs 9.1 (*)    Lymphocytes Relative 9 (*)    All other components within normal limits  COMPREHENSIVE METABOLIC PANEL - Abnormal; Notable for the following:    Glucose, Bld 114 (*)    BUN 25 (*)    Total Protein  5.6 (*)    Albumin 3.3 (*)    GFR calc non Af Amer 53 (*)    GFR calc Af Amer 61 (*)    All other components within normal limits  URINE MICROSCOPIC-ADD ON - Abnormal; Notable for the following:    Bacteria, UA FEW (*)    All other components within normal limits  URINE CULTURE  PROTIME-INR  HEMOGLOBIN AND HEMATOCRIT, BLOOD   Ct Abdomen Pelvis Wo Contrast  02/23/2013   *RADIOLOGY REPORT*  Clinical Data: Difficulty voiding for 1 day with pressure and dysuria and hematuria.  CT ABDOMEN AND PELVIS WITHOUT CONTRAST  Technique:  Multidetector CT imaging of the abdomen and pelvis was performed following the standard protocol without intravenous contrast.  Comparison: 07/23/2005  Findings: There is an area of reticular opacity in the lingular segment of the left upper lobe that is incompletely imaged, but is new from prior study.  It is likely scarring or atelectasis.  It is not mass-like.  There is some minimal reticular opacity at both lung bases that is likely scarring.  The heart is normal in size.  Normal liver and spleen.  The gallbladder is distended with multiple stones.  There is no wall thickening or pericholecystic inflammation to suggest acute cholecystitis.  No bile  duct dilation.  Normal pancreas.  Mild adrenal nodularity is stable from the prior exam.  Bilateral renal cortical thinning.  No renal masses or stones.  No hydronephrosis.  Normal ureters.  The contents of the bladder are relatively hyperattenuating averaging 70.5 HU.  This may reflect some dilute contrast.  It could be due to hemorrhage.  Nondependent air is noted in the bladder.  There is a Foley catheter that has been inserted into the bladder and thus nondependent air is presumed to be iatrogenic.  The uterus and adnexa are unremarkable.  No pathologically enlarged lymph nodes.  No abnormal fluid collections.  There is extensive colonic diverticulosis mostly along the left colon.  No diverticulitis or evidence of bowel  inflammation.  No obstruction.  A normal appendix is visualized.  There are significant degenerative changes of the visualized spine. A right hip prosthesis is well seated and aligned.  No osteoblastic or osteolytic lesions.  IMPRESSION: No acute findings.  No renal masses or stones or hydronephrosis. There is bilateral renal cortical thinning.  The fluid within the bladder is relatively hyperattenuating.  This could be from contrast.  It could reflect hemorrhage.  There is nondependent air in the bladder reflecting the recent Foley catheter placement.  Areas of lung scarring.  Multiple gallstones without evidence of acute cholecystitis.  Extensive colonic diverticulosis mostly of the left colon.  Degenerative changes noted of the visualized spine.   Original Report Authenticated By: Amie Portland, M.D.   1. Hematuria   2. Urinary retention     MDM  2 day history of dysuria and hematuria and questionable vaginal bleeding. Suprapubic tenderness.  No back pain, weakness, numbness, tingling.   Patient was unable to void. Foley catheter placed with 300 cc of frankly bloody urine obtained. patient reports improvement in her symptoms.  Hematuria persists despite irrigation.  Hb stable at 10.6    D/w Dr. Annabell Howells.  Will place larger 41F foley.  He irrigated several hundred milliliters of clot, urine starting to clear.  Suspect hemorrhagic cystitis. Abx given. Patient does have breast cancer history.  Uncertain cause of urinary retention. Cord compression considered though patient has no weakness in her lower extremities. She has no incontinence. She is unable to get an MRI secondary to her pacemaker. She denies any back pain. She feels much better with the catheter in place.  Given her age and comorbidities and extent of hematuria, will admit for observation. D/w Dr. Benjamine Mola.  Glynn Octave, MD 02/23/13 [phone]

## 2013-02-23 NOTE — ED Notes (Signed)
Patient states having difficulty voiding x 1 day, patient states she has pressure and some burning while trying to urinate, patient states also passing some blood in urine

## 2013-02-23 NOTE — Progress Notes (Signed)
Pt arrived to the unit with dx blood in urine with a foley in place. Here for observation. Pt alert and oriented x4. x1 assist. Has a right chest pacemaker. No skin break down noted. VS stable. Pt denies any pain upon admission. Oriented to staff and unit. Will cont to monitor.

## 2013-02-24 DIAGNOSIS — R3129 Other microscopic hematuria: Secondary | ICD-10-CM | POA: Diagnosis not present

## 2013-02-24 DIAGNOSIS — R339 Retention of urine, unspecified: Secondary | ICD-10-CM | POA: Diagnosis not present

## 2013-02-24 DIAGNOSIS — R609 Edema, unspecified: Secondary | ICD-10-CM

## 2013-02-24 DIAGNOSIS — I5022 Chronic systolic (congestive) heart failure: Secondary | ICD-10-CM | POA: Diagnosis not present

## 2013-02-24 DIAGNOSIS — I509 Heart failure, unspecified: Secondary | ICD-10-CM

## 2013-02-24 DIAGNOSIS — I2589 Other forms of chronic ischemic heart disease: Secondary | ICD-10-CM | POA: Diagnosis not present

## 2013-02-24 DIAGNOSIS — R300 Dysuria: Secondary | ICD-10-CM | POA: Diagnosis not present

## 2013-02-24 DIAGNOSIS — R319 Hematuria, unspecified: Secondary | ICD-10-CM | POA: Diagnosis not present

## 2013-02-24 LAB — CBC
HCT: 29.4 % — ABNORMAL LOW (ref 36.0–46.0)
Hemoglobin: 9.6 g/dL — ABNORMAL LOW (ref 12.0–15.0)
MCH: 31 pg (ref 26.0–34.0)
MCHC: 32.7 g/dL (ref 30.0–36.0)
MCV: 94.8 fL (ref 78.0–100.0)
Platelets: 252 10*3/uL (ref 150–400)
RBC: 3.1 MIL/uL — ABNORMAL LOW (ref 3.87–5.11)
RDW: 14.4 % (ref 11.5–15.5)
WBC: 7.9 10*3/uL (ref 4.0–10.5)

## 2013-02-24 LAB — BASIC METABOLIC PANEL
BUN: 14 mg/dL (ref 6–23)
CO2: 28 mEq/L (ref 19–32)
Calcium: 8.7 mg/dL (ref 8.4–10.5)
Chloride: 104 mEq/L (ref 96–112)
Creatinine, Ser: 0.85 mg/dL (ref 0.50–1.10)
GFR calc Af Amer: 69 mL/min — ABNORMAL LOW (ref >90)
GFR calc non Af Amer: 60 mL/min — ABNORMAL LOW (ref >90)
Glucose, Bld: 98 mg/dL (ref 70–99)
Potassium: 3.6 mEq/L (ref 3.5–5.1)
Sodium: 139 mEq/L (ref 135–145)

## 2013-02-24 NOTE — Progress Notes (Signed)
Patient ID: [name], female   DOB: 11-29-1922, 79 y.o.   MRN: [id number]    Subjective: [name] is doing well this morning.  She reports no pain and her urine is clear.  She has been started on cipro pending the culture.  Hgb is down slightly this am at 9.9 from 10.6.  ROS: Negative except as above.  She has had no fever or nausea.   Objective: Vital signs in last 24 hours: Temp:  [97.5 F (36.4 C)-98.6 F (37 C)] 98.6 F (37 C) (07/24 0514) Pulse Rate:  [72-93] 86 (07/24 0514) Resp:  [14-20] 18 (07/24 0514) BP: (117-170)/(43-81) 117/53 mmHg (07/24 0514) SpO2:  [96 %-98 %] 97 % (07/24 0514) Weight:  [75.8 kg (167 lb 1.7 oz)-81.647 kg (180 lb)] 76.023 kg (167 lb 9.6 oz) (07/23 2031)  Intake/Output from previous day: 07/23 0701 - 07/24 0700 In: 406.3 [P.O.:240; I.V.:166.3] Out: 2150 [Urine:2150] Intake/Output this shift: Total I/O In: 406.3 [P.O.:240; I.V.:166.3] Out: 2150 [Urine:2150]  General appearance: alert and no distress urine clear in foley tube.   Lab Results:   Recent Labs  02/23/13 1003 02/23/13 1603  WBC 10.9*  --   HGB 10.6* 9.9*  HCT 31.8* 29.9*  PLT 248  --    BMET  Recent Labs  02/23/13 1003  NA 139  K 3.7  CL 104  CO2 26  GLUCOSE 114*  BUN 25*  CREATININE 0.94  CALCIUM 8.9   PT/INR  Recent Labs  02/23/13 1049  LABPROT 13.5  INR 1.05   ABG No results found for this basename: PHART, PCO2, PO2, HCO3,  in the last 72 hours  Studies/Results: Ct Abdomen Pelvis Wo Contrast  02/23/2013   *RADIOLOGY REPORT*  Clinical Data: Difficulty voiding for 1 day with pressure and dysuria and hematuria.  CT ABDOMEN AND PELVIS WITHOUT CONTRAST  Technique:  Multidetector CT imaging of the abdomen and pelvis was performed following the standard protocol without intravenous contrast.  Comparison: 07/23/2005  Findings: There is an area of reticular opacity in the lingular segment of the left upper lobe that is incompletely imaged, but is new from prior  study.  It is likely scarring or atelectasis.  It is not mass-like.  There is some minimal reticular opacity at both lung bases that is likely scarring.  The heart is normal in size.  Normal liver and spleen.  The gallbladder is distended with multiple stones.  There is no wall thickening or pericholecystic inflammation to suggest acute cholecystitis.  No bile duct dilation.  Normal pancreas.  Mild adrenal nodularity is stable from the prior exam.  Bilateral renal cortical thinning.  No renal masses or stones.  No hydronephrosis.  Normal ureters.  The contents of the bladder are relatively hyperattenuating averaging 70.5 HU.  This may reflect some dilute contrast.  It could be due to hemorrhage.  Nondependent air is noted in the bladder.  There is a Foley catheter that has been inserted into the bladder and thus nondependent air is presumed to be iatrogenic.  The uterus and adnexa are unremarkable.  No pathologically enlarged lymph nodes.  No abnormal fluid collections.  There is extensive colonic diverticulosis mostly along the left colon.  No diverticulitis or evidence of bowel inflammation.  No obstruction.  A normal appendix is visualized.  There are significant degenerative changes of the visualized spine. A right hip prosthesis is well seated and aligned.  No osteoblastic or osteolytic lesions.  IMPRESSION: No acute findings.  No renal masses  or stones or hydronephrosis. There is bilateral renal cortical thinning.  The fluid within the bladder is relatively hyperattenuating.  This could be from contrast.  It could reflect hemorrhage.  There is nondependent air in the bladder reflecting the recent Foley catheter placement.  Areas of lung scarring.  Multiple gallstones without evidence of acute cholecystitis.  Extensive colonic diverticulosis mostly of the left colon.  Degenerative changes noted of the visualized spine.   Original Report Authenticated By: Amie Portland, M.D.     Anti-infectives: Anti-infectives   Start     Dose/Rate Route Frequency Ordered Stop   02/23/13 1900  cefTRIAXone (ROCEPHIN) 1 g in dextrose 5 % 50 mL IVPB     1 g 100 mL/hr over 30 Minutes Intravenous Every 24 hours 02/23/13 1736     02/23/13 1515  ciprofloxacin (CIPRO) IVPB 400 mg     400 mg 200 mL/hr over 60 Minutes Intravenous  Once 02/23/13 1513 02/23/13 1653      Current Facility-Administered Medications  Medication Dose Route Frequency Provider Last Rate Last Dose  . 0.9 %  sodium chloride infusion   Intravenous STAT Glynn Octave, MD 75 mL/hr at 02/23/13 2050    . acetaminophen (TYLENOL) tablet 650 mg  650 mg Oral Q6H PRN Joseph Art, DO       Or  . acetaminophen (TYLENOL) suppository 650 mg  650 mg Rectal Q6H PRN Joseph Art, DO      . carvedilol (COREG) tablet 3.125 mg  3.125 mg Oral BID WC Jessica U Vann, DO      . cefTRIAXone (ROCEPHIN) 1 g in dextrose 5 % 50 mL IVPB  1 g Intravenous Q24H Joseph Art, DO   1 g at 02/23/13 2020  . furosemide (LASIX) tablet 20 mg  20 mg Oral QHS Joseph Art, DO   20 mg at 02/23/13 2117  . isosorbide mononitrate (IMDUR) 24 hr tablet 90 mg  90 mg Oral Daily Joseph Art, DO   90 mg at 02/23/13 2023  . ondansetron (ZOFRAN) tablet 4 mg  4 mg Oral Q6H PRN Joseph Art, DO       Or  . ondansetron (ZOFRAN) injection 4 mg  4 mg Intravenous Q6H PRN Joseph Art, DO      . ramipril (ALTACE) capsule 2.5 mg  2.5 mg Oral q morning - 10a Jessica U Vann, DO      . simvastatin (ZOCOR) tablet 10 mg  10 mg Oral QHS Joseph Art, DO   10 mg at 02/23/13 2117  . sodium chloride 0.9 % injection 3 mL  3 mL Intravenous Q12H Joseph Art, DO        Assessment: Probable hemorrhagic cystitis with clot retention now with clear urine.   Plan: Continue current antibiotics pending culture. Hold plavix and ASA for a week. I will have her call the office for an appt for cystoscopy in 2-3 weeks. D/C foley and home when voiding if ok with  medical service.     LOS: 1 day    Rodricus Candelaria J 02/24/2013

## 2013-02-24 NOTE — Progress Notes (Addendum)
[facility] PROGRESS NOTE  [name] ZOX:[id number] DOB: 1922/12/24 DOA: 02/23/2013 PCP: Thora Lance, MD  Assessment/Plan: Dysuria/hematuria -Followed by inability to void -Foley catheter placed in ED but could not irrigate -CT abdomen and pelvis showed hyperattenuating fluid and hyperdense m in the bladder consistent with clot -urology-Dr. Annabell Howells followup -Continue empiric antibiotic coverage pending urine culture  -Cystoscopy a later date per urology unless rebleeds -Hold aspirin and Plavix x1 week  -Foley catheter has been discontinued  -follow up with Dr. 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 Jude CRT-P implant 10/24  - Device interrogation on 10/25 demonstrated no LV capture resulting in LV lead revision 10/25  - Device interrogation on 10/26 demonstrated no LV capture, will return 10/28 for repeat lead revision  -EF 30% Coronary artery disease  -hx DES to LAD with residual diagonal disease to small for PCI  -Continue Imdur, Coreg, Zocor Family Communication:   Pt and husband at beside Disposition Plan:   Home 7/25 if stable  Antibiotics:  Ceftriaxone 02/23/2013 >>>         Procedures/Studies: Ct Abdomen Pelvis Wo Contrast  02/23/2013   *RADIOLOGY REPORT*  Clinical Data: Difficulty voiding for 1 day with pressure and dysuria and hematuria.  CT ABDOMEN AND PELVIS WITHOUT CONTRAST  Technique:  Multidetector CT imaging of the abdomen and pelvis was performed following the standard protocol without intravenous contrast.  Comparison: 07/23/2005  Findings: There is an area of reticular opacity in the lingular segment of the left upper lobe that is incompletely imaged, but is new from prior study.  It is likely scarring or atelectasis.  It is not mass-like.  There is some minimal reticular opacity at both lung bases that is  likely scarring.  The heart is normal in size.  Normal liver and spleen.  The gallbladder is distended with multiple stones.  There is no wall thickening or pericholecystic inflammation to suggest acute cholecystitis.  No bile duct dilation.  Normal pancreas.  Mild adrenal nodularity is stable from the prior exam.  Bilateral renal cortical thinning.  No renal masses or stones.  No hydronephrosis.  Normal ureters.  The contents of the bladder are relatively hyperattenuating averaging 70.5 HU.  This may reflect some dilute contrast.  It could be due to hemorrhage.  Nondependent air is noted in the bladder.  There is a Foley catheter that has been inserted into the bladder and thus nondependent air is presumed to be iatrogenic.  The uterus and adnexa are unremarkable.  No pathologically enlarged lymph nodes.  No abnormal fluid collections.  There is extensive colonic diverticulosis mostly along the left colon.  No diverticulitis or evidence of bowel inflammation.  No obstruction.  A normal appendix is visualized.  There are significant degenerative changes of the visualized spine. A right hip prosthesis is well seated and aligned.  No osteoblastic or osteolytic lesions.  IMPRESSION: No acute findings.  No renal masses or stones or hydronephrosis. There is bilateral renal cortical thinning.  The fluid within the bladder is relatively hyperattenuating.  This could be from contrast.  It could reflect hemorrhage.  There is nondependent air in the bladder reflecting the recent Foley catheter placement.  Areas of lung scarring.  Multiple gallstones without evidence of acute cholecystitis.  Extensive colonic diverticulosis mostly of the left colon.  Degenerative changes noted of the visualized spine.  Original Report Authenticated By: Amie Portland, M.D.         Subjective:  patient is feeling well. She is no longer having any hematuria or dysuria. Denies any nausea, vomiting, chest pain, shortness breath, abdominal  pain. No fevers or chills.   Objective: Filed Vitals:   02/23/13 2031 02/24/13 0514 02/24/13 1000 02/24/13 1357  BP: 119/43 117/53 112/37 125/48  Pulse: 72 86 91 76  Temp: 98.5 F (36.9 C) 98.6 F (37 C) 98.6 F (37 C) 98.2 F (36.8 C)  TempSrc: Oral Oral Oral Oral  Resp: 18 18 18 18   Height: 5\' 5"  (1.651 m)     Weight: 76.023 kg (167 lb 9.6 oz)     SpO2: 97% 97% 96% 96%    Intake/Output Summary (Last 24 hours) at 02/24/13 1853 Last data filed at 02/24/13 1300  Gross per 24 hour  Intake 886.25 ml  Output   2351 ml  Net -1464.75 ml   Weight change:  Exam:   General:  Pt is alert, follows commands appropriately, not in acute distress  HEENT: No icterus, No thrush,  [place]/AT  Cardiovascular: RRR, S1/S2, no rubs, no gallops  Respiratory: bibasilar crackles. No wheezes. Good air movement.  Abdomen: Soft/+BS, non tender, non distended, no guarding  Extremities: 2+ LEedema, No lymphangitis, No petechiae, No rashes, no synovitis  Data Reviewed: Basic Metabolic Panel:  Recent Labs Lab 02/23/13 1003 02/24/13 0454  NA 139 139  K 3.7 3.6  CL 104 104  CO2 26 28  GLUCOSE 114* 98  BUN 25* 14  CREATININE 0.94 0.85  CALCIUM 8.9 8.7   Liver Function Tests:  Recent Labs Lab 02/23/13 1003  AST 11  ALT 8  ALKPHOS 89  BILITOT 0.5  PROT 5.6*  ALBUMIN 3.3*   No results found for this basename: LIPASE, AMYLASE,  in the last 168 hours No results found for this basename: AMMONIA,  in the last 168 hours CBC:  Recent Labs Lab 02/23/13 1003 02/23/13 1603 02/24/13 0454  WBC 10.9*  --  7.9  NEUTROABS 9.1*  --   --   HGB 10.6* 9.9* 9.6*  HCT 31.8* 29.9* 29.4*  MCV 93.0  --  94.8  PLT 248  --  252   Cardiac Enzymes: No results found for this basename: CKTOTAL, CKMB, CKMBINDEX, TROPONINI,  in the last 168 hours BNP: No components found with this basename: POCBNP,  CBG: No results found for this basename: GLUCAP,  in the last 168 hours  No results found for this  or any previous visit (from the past 240 hour(s)).   Scheduled Meds: . carvedilol  3.125 mg Oral BID WC  . cefTRIAXone (ROCEPHIN)  IV  1 g Intravenous Q24H  . furosemide  20 mg Oral QHS  . isosorbide mononitrate  90 mg Oral Daily  . ramipril  2.5 mg Oral q morning - 10a  . simvastatin  10 mg Oral QHS  . sodium chloride  3 mL Intravenous Q12H   Continuous Infusions:    Monica Spark, DO  [facility] Pager [phone]  If 7PM-7AM, please contact night-coverage [email or web address] Password [facility] 02/24/2013, 6:53 PM   LOS: 1 day

## 2013-02-24 NOTE — Progress Notes (Signed)
UR COMPLETED  

## 2013-02-25 DIAGNOSIS — I5022 Chronic systolic (congestive) heart failure: Secondary | ICD-10-CM | POA: Diagnosis not present

## 2013-02-25 DIAGNOSIS — R319 Hematuria, unspecified: Secondary | ICD-10-CM | POA: Diagnosis not present

## 2013-02-25 DIAGNOSIS — R339 Retention of urine, unspecified: Secondary | ICD-10-CM | POA: Diagnosis not present

## 2013-02-25 DIAGNOSIS — I2589 Other forms of chronic ischemic heart disease: Secondary | ICD-10-CM | POA: Diagnosis not present

## 2013-02-25 MED ORDER — CIPROFLOXACIN HCL 250 MG PO TABS
250.0000 mg | ORAL_TABLET | Freq: Two times a day (BID) | ORAL | Status: DC
  Filled 2013-02-25 (×3): qty 1

## 2013-02-25 MED ORDER — CIPROFLOXACIN HCL 250 MG PO TABS: 250.0000 mg | ORAL_TABLET | Freq: Two times a day (BID) | ORAL | Status: DC

## 2013-02-25 NOTE — Progress Notes (Addendum)
Discussed discharge instructions and medications with pt. Pt showed no barriers to discharge. IV removed. Tele removed. Pt discharged to [facility] with family. Assessment unchanged from morning. Pt opted to take her once daily medications once she arrives at facility.

## 2013-02-25 NOTE — Progress Notes (Signed)
Pt aggravated by bed alarm. Pt states she is going to be discharged today and will be walking on her own in a matter of hours, so she doesn't see why she can't walk independently now. Educated pt on importance for bed alarm. Pt states, I promise you I'm not gonna fall. Bed alarm turned off (pt alert and oriented). Will continue to monitor closely. Door open.

## 2013-02-25 NOTE — Discharge Summary (Signed)
Physician Discharge Summary  [name] RUE:[id number] DOB: [birth year] DOA: 02/23/2013  PCP: Thora Lance, MD  Admit date: 02/23/2013 Discharge date: 02/25/2013  Recommendations for Outpatient Follow-up:  1. Pt will need to follow up with PCP in 2 weeks post discharge 2. Please obtain BMP to evaluate electrolytes and kidney function 3. Please also check CBC to evaluate Hg and Hct levels 4. Follow up with Dr. Bjorn Pippin in 2-3weeks 5. Please followup on final urine culture data that was initially sent on 02/23/2013 6. The patient should restart taking aspirin and Plavix on 03/02/2013 which would consist of 7 days off of antiplatelet therapy as per urology recommendations   Discharge Diagnoses:  Active Problems:   UTI (lower urinary tract infection)   Urinary retention   Hematuria Dysuria/hematuria  -Presented to the emergency department with dysuria and hematuria -Followed by inability to void  -Foley catheter placed in ED but could not irrigate  -CT abdomen and pelvis showed hyperattenuating fluid and hyperdense m in the bladder consistent with clot  -urology-Dr. Annabell Howells followup appreciated -Continue empiric antibiotic coverage--the patient will be discharged with ciprofloxacin 250 mg twice a day x5 days to finish a seven-day course -The patient had 2 days of ceftriaxone while she was in the hospital -Please followup on the patient's urine culture data -Cystoscopy a later date per urology unless rebleeds--patient did not have any further hematuria after Foley catheter was removed and her dysuria improved. -Hold aspirin and Plavix x1 week  -The patient was instructed to not restart aspirin and Plavix until 03/02/2013 -Foley catheter has been discontinued 24 hours prior to discharge, and the patient was able to void spontaneously -follow up with Dr. Annabell Howells in 2-3wks  Normocytic anemia  -Stable  -Baseline hemoglobin 10-11  -Transfuse for hemoglobin less than 7  Mixed  Cardiomyopathy w/ Chronic Systolic CHF and LBBB  - s/p [employer]. Jude CRT-P implant 10/24  - Device interrogation on 10/25 demonstrated no LV capture resulting in LV lead revision 10/25  - Device interrogation on 10/26 demonstrated no LV capture, will return 10/28 for repeat lead revision  -EF 30%  Coronary artery disease  -hx DES to LAD with residual diagonal disease to small for PCI  -Continue Imdur, Coreg, Zocor  Family Communication: Pt and husband at beside  Disposition Plan: Home 7/25 if stable  Antibiotics:  Ceftriaxone 02/23/2013 >>> 02/25/2013 Ciprofloxacin 02/25/2013>>>   Discharge Condition: Stable  Disposition:  Follow-up Information   Follow up with Anner Crete, MD In 2 weeks.   Contact information:   [address] [place] [address] [phone]       Follow up with Thora Lance, MD In 2 weeks.   Contact information:   [address] [place] [address] [phone]       Diet:cardiac Wt Readings from Last 3 Encounters:  02/24/13 76.885 kg (169 lb 8 oz)  01/04/13 81.557 kg (179 lb 12.8 oz)  10/22/12 86.365 kg (190 lb 6.4 oz)    History of present illness:  Presents with 1 day h/o burning with urination and blood in urine. She has had frequency and urgency. No fever, no chills. No constipation. No new meds- had diarrhea 1 week ago and took a few lomotil but no loose stool in the last week. In the ED, the patient had difficulty urinating with some urine retention. A Foley catheter was placed. In the ER, a foley was placed and she drained clots. A CT scan was done that showed no  renal issues but bladder was mod distended with clots. She was seen by urology in the ER who recommended admission to medicine.     Consultants: urology  Discharge Exam: Filed Vitals:   02/25/13 0407  BP: 147/72  Pulse: 84  Temp: 97.9 F (36.6 C)  Resp: 18   Filed Vitals:   02/24/13 1000 02/24/13 1357 02/24/13 2214 02/25/13 0407  BP: 112/37  125/48 98/65 147/72  Pulse: 91 76 80 84  Temp: 98.6 F (37 C) 98.2 F (36.8 C) 99 F (37.2 C) 97.9 F (36.6 C)  TempSrc: Oral Oral Oral Oral  Resp: 18 18 18 18   Height:      Weight:   76.885 kg (169 lb 8 oz)   SpO2: 96% 96% 98% 96%   General: A&O x 3, NAD, pleasant, cooperative Cardiovascular: RRR, no rub, no gallop, no S3 Respiratory: CTAB, no wheeze, no rhonchi Abdomen:soft, nontender, nondistended, positive bowel sounds Extremities: 2+LE edema, No lymphangitis, no petechiae  Discharge Instructions  Discharge Orders   Future Appointments Provider Department Dept Phone   03/07/2013 11:10 AM Currie Paris, MD [facility], [place] [phone]   Future Orders Complete By Expires     Diet - low sodium heart healthy  As directed     Discharge instructions  As directed     Comments:      Do NOT restart taking aspirin until 03/02/2013 Do NOT restart taking Plavix until 03/02/2013    Increase activity slowly  As directed         Medication List         alendronate 70 MG tablet  Commonly known as:  FOSAMAX  - Take 70 mg by mouth every 7 (seven) days. Take in the morning with a full glass of water, on an empty stomach, and do not take anything else by mouth or lie down for the next 30 min.  - On Wednesday     ascorbic acid 500 MG tablet  Commonly known as:  VITAMIN C  Take 500 mg by mouth daily.     aspirin EC 81 MG tablet  Take 81 mg by mouth daily.     carvedilol 3.125 MG tablet  Commonly known as:  COREG  Take 3.125 mg by mouth 2 (two) times daily with a meal.     ciprofloxacin 250 MG tablet  Commonly known as:  CIPRO  Take 1 tablet (250 mg total) by mouth 2 (two) times daily.     clopidogrel 75 MG tablet  Commonly known as:  PLAVIX  Take 75 mg by mouth daily.     furosemide 20 MG tablet  Commonly known as:  LASIX  Take 20 mg by mouth at bedtime.     isosorbide mononitrate 60 MG 24 hr tablet  Commonly known as:  IMDUR  Take 90 mg by mouth  daily.     nitroGLYCERIN 0.4 MG SL tablet  Commonly known as:  NITROSTAT  Place 0.4 mg under the tongue every 5 (five) minutes as needed for chest pain.     OCUVITE PRESERVISION PO  Take 1 capsule by mouth 2 (two) times daily.     ramipril 2.5 MG capsule  Commonly known as:  ALTACE  Take 2.5 mg by mouth every morning.     simvastatin 10 MG tablet  Commonly known as:  ZOCOR  Take 10 mg by mouth at bedtime.     traMADol 50 MG tablet  Commonly known as:  ULTRAM  Take  100 mg by mouth See admin instructions. Take two tablets in the morning, and if needed, two more in the evening for pain.     Vitamin D-3 5000 UNITS Tabs  Take 5,000 Units by mouth daily.         The results of significant diagnostics from this hospitalization (including imaging, microbiology, ancillary and laboratory) are listed below for reference.    Significant Diagnostic Studies: Ct Abdomen Pelvis Wo Contrast  02/23/2013   *RADIOLOGY REPORT*  Clinical Data: Difficulty voiding for 1 day with pressure and dysuria and hematuria.  CT ABDOMEN AND PELVIS WITHOUT CONTRAST  Technique:  Multidetector CT imaging of the abdomen and pelvis was performed following the standard protocol without intravenous contrast.  Comparison: 07/23/2005  Findings: There is an area of reticular opacity in the lingular segment of the left upper lobe that is incompletely imaged, but is new from prior study.  It is likely scarring or atelectasis.  It is not mass-like.  There is some minimal reticular opacity at both lung bases that is likely scarring.  The heart is normal in size.  Normal liver and spleen.  The gallbladder is distended with multiple stones.  There is no wall thickening or pericholecystic inflammation to suggest acute cholecystitis.  No bile duct dilation.  Normal pancreas.  Mild adrenal nodularity is stable from the prior exam.  Bilateral renal cortical thinning.  No renal masses or stones.  No hydronephrosis.  Normal ureters.  The  contents of the bladder are relatively hyperattenuating averaging 70.5 HU.  This may reflect some dilute contrast.  It could be due to hemorrhage.  Nondependent air is noted in the bladder.  There is a Foley catheter that has been inserted into the bladder and thus nondependent air is presumed to be iatrogenic.  The uterus and adnexa are unremarkable.  No pathologically enlarged lymph nodes.  No abnormal fluid collections.  There is extensive colonic diverticulosis mostly along the left colon.  No diverticulitis or evidence of bowel inflammation.  No obstruction.  A normal appendix is visualized.  There are significant degenerative changes of the visualized spine. A right hip prosthesis is well seated and aligned.  No osteoblastic or osteolytic lesions.  IMPRESSION: No acute findings.  No renal masses or stones or hydronephrosis. There is bilateral renal cortical thinning.  The fluid within the bladder is relatively hyperattenuating.  This could be from contrast.  It could reflect hemorrhage.  There is nondependent air in the bladder reflecting the recent Foley catheter placement.  Areas of lung scarring.  Multiple gallstones without evidence of acute cholecystitis.  Extensive colonic diverticulosis mostly of the left colon.  Degenerative changes noted of the visualized spine.   Original Report Authenticated By: Amie Portland, M.D.     Microbiology: Recent Results (from the past 240 hour(s))  URINE CULTURE     Status: None   Collection Time    02/23/13  9:56 AM      Result Value Range Status   Specimen Description URINE, CATHETERIZED   Final   Special Requests NONE   Final   Culture  Setup Time 02/23/2013 11:17   Final   Colony Count PENDING   Incomplete   Culture Culture reincubated for better growth   Final   Report Status PENDING   Incomplete     Labs: Basic Metabolic Panel:  Recent Labs Lab 02/23/13 1003 02/24/13 0454  NA 139 139  K 3.7 3.6  CL 104 104  CO2 26 28  GLUCOSE  114* 98   BUN 25* 14  CREATININE 0.94 0.85  CALCIUM 8.9 8.7   Liver Function Tests:  Recent Labs Lab 02/23/13 1003  AST 11  ALT 8  ALKPHOS 89  BILITOT 0.5  PROT 5.6*  ALBUMIN 3.3*   No results found for this basename: LIPASE, AMYLASE,  in the last 168 hours No results found for this basename: AMMONIA,  in the last 168 hours CBC:  Recent Labs Lab 02/23/13 1003 02/23/13 1603 02/24/13 0454  WBC 10.9*  --  7.9  NEUTROABS 9.1*  --   --   HGB 10.6* 9.9* 9.6*  HCT 31.8* 29.9* 29.4*  MCV 93.0  --  94.8  PLT 248  --  252   Cardiac Enzymes: No results found for this basename: CKTOTAL, CKMB, CKMBINDEX, TROPONINI,  in the last 168 hours BNP: No components found with this basename: POCBNP,  CBG: No results found for this basename: GLUCAP,  in the last 168 hours  Time coordinating discharge:  Greater than 30 minutes  Signed:  Giomar Gusler, DO [facility] Pager: [phone] 02/25/2013, 9:52 AM

## 2013-02-26 LAB — URINE CULTURE: Colony Count: 15000

## 2013-02-28 DIAGNOSIS — E538 Deficiency of other specified B group vitamins: Secondary | ICD-10-CM | POA: Diagnosis not present

## 2013-03-04 DIAGNOSIS — R34 Anuria and oliguria: Secondary | ICD-10-CM | POA: Diagnosis not present

## 2013-03-07 ENCOUNTER — Encounter (INDEPENDENT_AMBULATORY_CARE_PROVIDER_SITE_OTHER): Payer: Self-pay | Admitting: Surgery

## 2013-03-07 ENCOUNTER — Ambulatory Visit (INDEPENDENT_AMBULATORY_CARE_PROVIDER_SITE_OTHER): Payer: Medicare Other | Admitting: Surgery

## 2013-03-07 VITALS — BP 140/92 | HR 84 | Temp 98.6°F | Resp 16 | Ht 65.0 in | Wt 189.6 lb

## 2013-03-07 DIAGNOSIS — Z09 Encounter for follow-up examination after completed treatment for conditions other than malignant neoplasm: Secondary | ICD-10-CM

## 2013-03-07 NOTE — Progress Notes (Signed)
NAME: [name]                                            DOB: 1922/09/11 DATE: 03/07/2013                                                  MRN: [id number]  CC:  Chief Complaint  Patient presents with  . Routine Post Op    1st p/o umb hernia    HPI: This patient comes in for post op follow-up .Sheunderwent repair of an umbilical hernia on 01/10/13. She feels that she is doing well.  PE:  VITAL SIGNS: BP 140/92  Pulse 84  Temp(Src) 98.6 F (37 C) (Temporal)  Resp 16  Ht 5\' 5"  (1.651 m)  Wt 189 lb 9.6 oz (86.002 kg)  BMI 31.55 kg/m2  General: The patient appears to be healthy, NAD Incision: Healing nicely, repair solid  IMPRESSION: The patient is doing well S/P UH repair.    PLAN: RTC PRN

## 2013-03-07 NOTE — Patient Instructions (Signed)
We will see you again on an as needed basis. Please call the office at [phone] if you have any questions or concerns. Thank you for allowing us to take care of you.  

## 2013-03-09 ENCOUNTER — Other Ambulatory Visit: Payer: Self-pay

## 2013-03-11 DIAGNOSIS — Z79899 Other long term (current) drug therapy: Secondary | ICD-10-CM | POA: Diagnosis not present

## 2013-03-11 DIAGNOSIS — R609 Edema, unspecified: Secondary | ICD-10-CM | POA: Diagnosis not present

## 2013-03-11 DIAGNOSIS — D649 Anemia, unspecified: Secondary | ICD-10-CM | POA: Diagnosis not present

## 2013-03-11 DIAGNOSIS — N3090 Cystitis, unspecified without hematuria: Secondary | ICD-10-CM | POA: Diagnosis not present

## 2013-03-14 DIAGNOSIS — N329 Bladder disorder, unspecified: Secondary | ICD-10-CM | POA: Diagnosis not present

## 2013-03-14 DIAGNOSIS — R82998 Other abnormal findings in urine: Secondary | ICD-10-CM | POA: Diagnosis not present

## 2013-03-14 DIAGNOSIS — R310 Gross hematuria: Secondary | ICD-10-CM | POA: Diagnosis not present

## 2013-03-23 DIAGNOSIS — M1710 Unilateral primary osteoarthritis, unspecified knee: Secondary | ICD-10-CM | POA: Diagnosis not present

## 2013-03-29 DIAGNOSIS — M1710 Unilateral primary osteoarthritis, unspecified knee: Secondary | ICD-10-CM | POA: Diagnosis not present

## 2013-03-29 DIAGNOSIS — E538 Deficiency of other specified B group vitamins: Secondary | ICD-10-CM | POA: Diagnosis not present

## 2013-04-06 DIAGNOSIS — M1710 Unilateral primary osteoarthritis, unspecified knee: Secondary | ICD-10-CM | POA: Diagnosis not present

## 2013-04-11 IMAGING — CR DG CHEST 2V
2 series · 2 of 2 positions shown · non-contrast
Comparison: 05/29/2012

CLINICAL DATA: Status post pacemaker placement.

CHEST - 2 VIEW

[w chest pa]
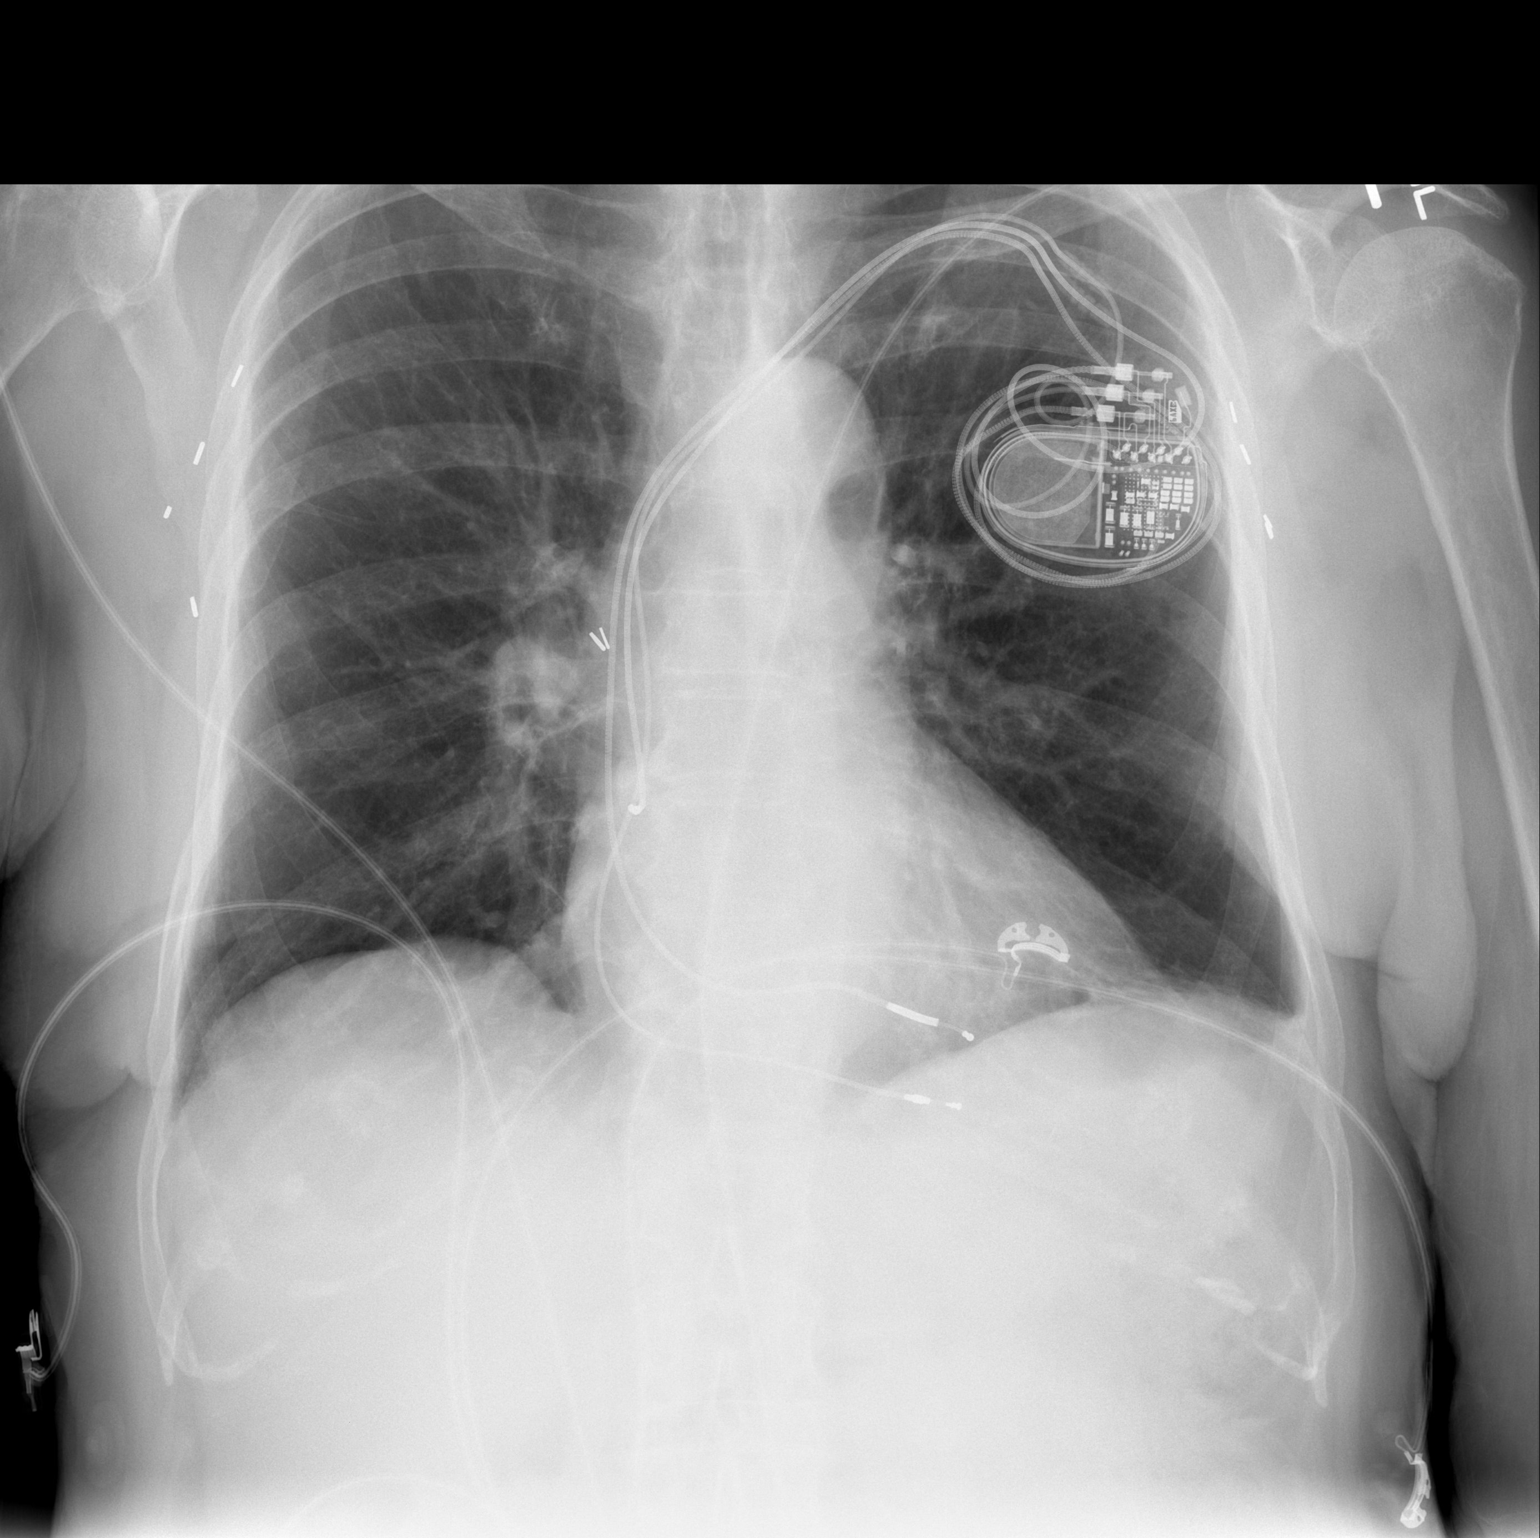

[w chest lat]
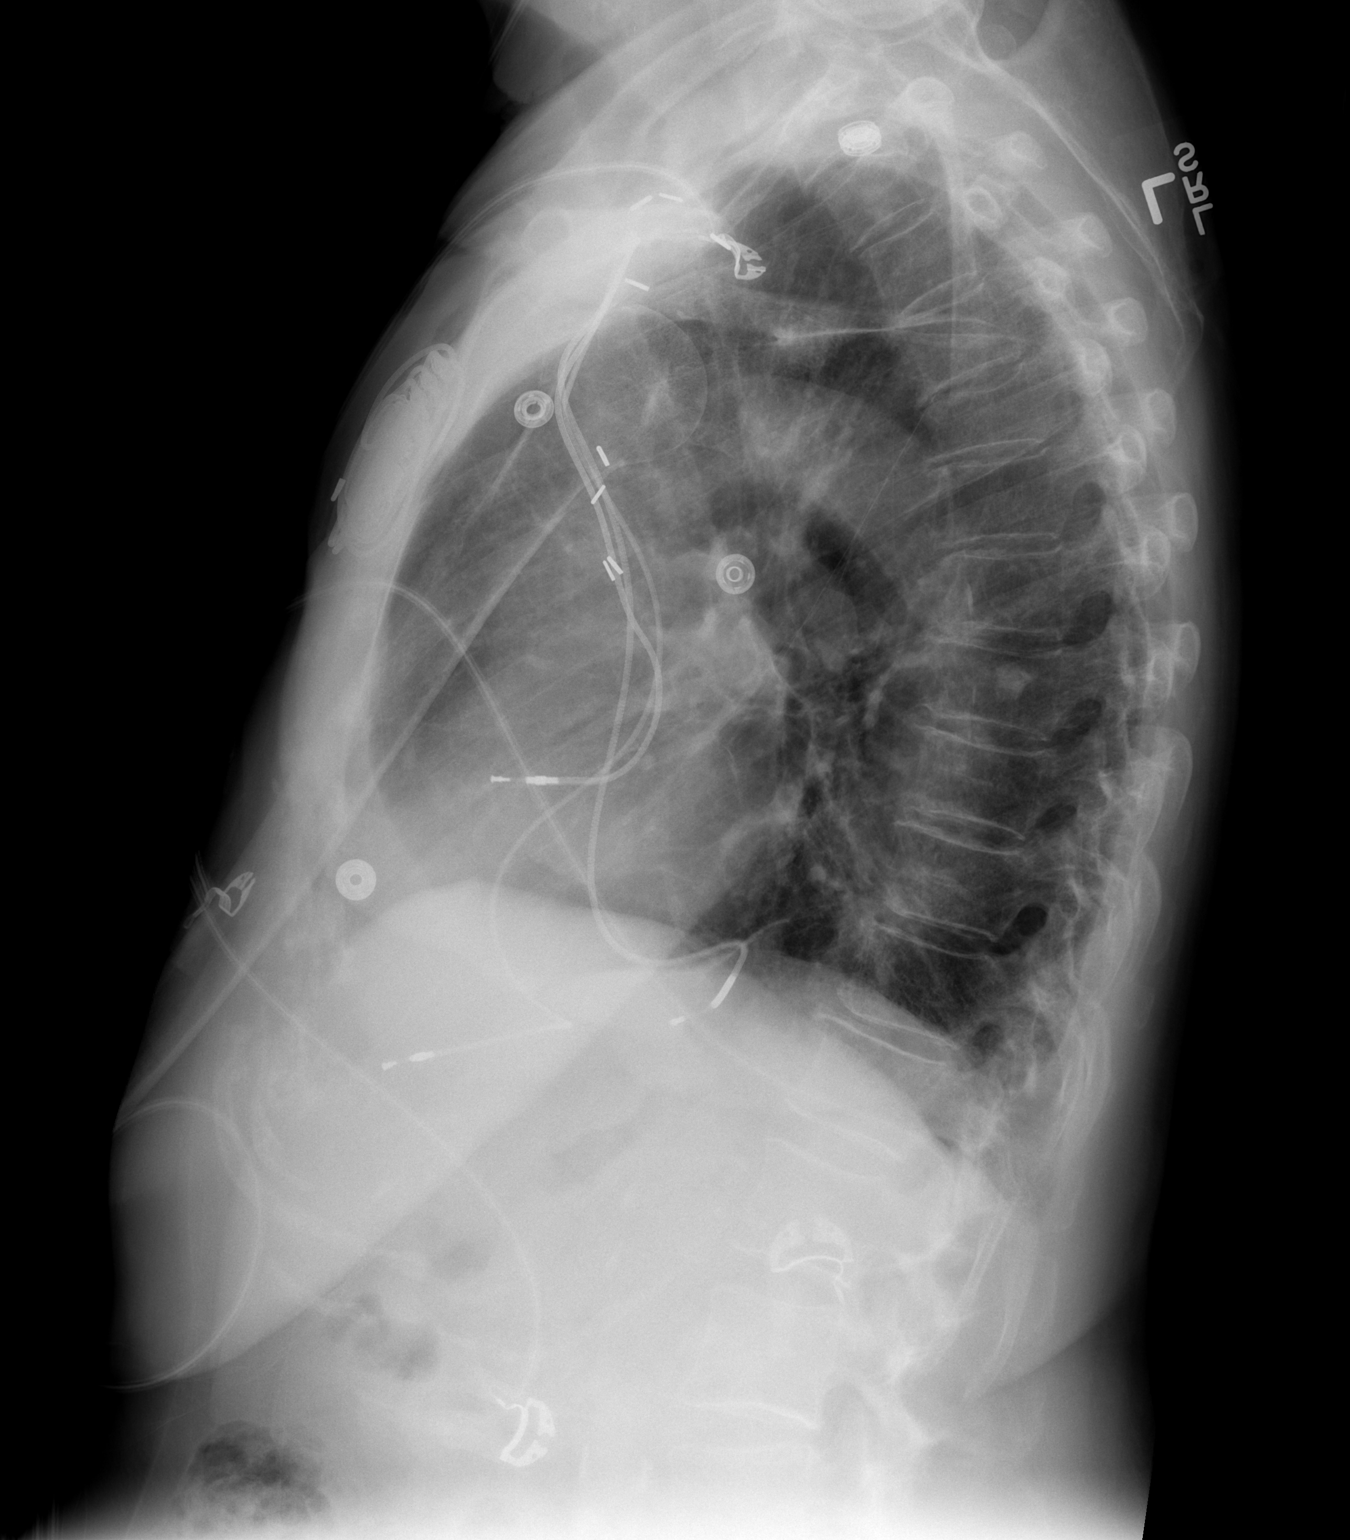

[2 of 2 positions shown; findings below may reference images not displayed]

FINDINGS: Lateral view degraded by patient arm position.

Mild upper thoracic compression deformity which is unchanged.  New
pacer with leads right atrium right ventricle.

Right glenohumeral joint osteoarthritis. Midline trachea.  Normal
heart size.  Tortuous thoracic aorta.  Similar prominence of the
right hilum (back to 09/24/2010) with surgical clips in this
region.  Minimal left costophrenic angle blunting is likely due to
pleural thickening and is unchanged.

Mild left base scarring or atelectasis.  Surgical clips project
over the lateral hemithorax bilaterally.
IMPRESSION: Placement of a pacer, without evidence of pneumothorax.

No acute findings.

## 2013-04-12 DIAGNOSIS — J029 Acute pharyngitis, unspecified: Secondary | ICD-10-CM | POA: Diagnosis not present

## 2013-04-13 DIAGNOSIS — M1710 Unilateral primary osteoarthritis, unspecified knee: Secondary | ICD-10-CM | POA: Diagnosis not present

## 2013-04-20 DIAGNOSIS — M1710 Unilateral primary osteoarthritis, unspecified knee: Secondary | ICD-10-CM | POA: Diagnosis not present

## 2013-04-21 DIAGNOSIS — H353290 Exudative age-related macular degeneration, unspecified eye, stage unspecified: Secondary | ICD-10-CM | POA: Diagnosis not present

## 2013-04-21 DIAGNOSIS — H31019 Macula scars of posterior pole (postinflammatory) (post-traumatic), unspecified eye: Secondary | ICD-10-CM | POA: Diagnosis not present

## 2013-04-21 DIAGNOSIS — Z961 Presence of intraocular lens: Secondary | ICD-10-CM | POA: Diagnosis not present

## 2013-04-21 DIAGNOSIS — H353190 Nonexudative age-related macular degeneration, unspecified eye, stage unspecified: Secondary | ICD-10-CM | POA: Diagnosis not present

## 2013-04-21 DIAGNOSIS — H3530 Unspecified macular degeneration: Secondary | ICD-10-CM | POA: Diagnosis not present

## 2013-04-28 DIAGNOSIS — E538 Deficiency of other specified B group vitamins: Secondary | ICD-10-CM | POA: Diagnosis not present

## 2013-05-25 ENCOUNTER — Other Ambulatory Visit: Payer: Self-pay | Admitting: Cardiology

## 2013-05-25 DIAGNOSIS — Z23 Encounter for immunization: Secondary | ICD-10-CM | POA: Diagnosis not present

## 2013-06-08 DIAGNOSIS — E538 Deficiency of other specified B group vitamins: Secondary | ICD-10-CM | POA: Diagnosis not present

## 2013-06-09 ENCOUNTER — Other Ambulatory Visit: Payer: Self-pay

## 2013-06-13 DIAGNOSIS — N329 Bladder disorder, unspecified: Secondary | ICD-10-CM | POA: Diagnosis not present

## 2013-07-06 DIAGNOSIS — R279 Unspecified lack of coordination: Secondary | ICD-10-CM | POA: Diagnosis not present

## 2013-07-06 DIAGNOSIS — IMO0002 Reserved for concepts with insufficient information to code with codable children: Secondary | ICD-10-CM | POA: Diagnosis not present

## 2013-07-06 DIAGNOSIS — M1710 Unilateral primary osteoarthritis, unspecified knee: Secondary | ICD-10-CM | POA: Diagnosis not present

## 2013-07-06 DIAGNOSIS — M6281 Muscle weakness (generalized): Secondary | ICD-10-CM | POA: Diagnosis not present

## 2013-07-06 DIAGNOSIS — E538 Deficiency of other specified B group vitamins: Secondary | ICD-10-CM | POA: Diagnosis not present

## 2013-07-06 DIAGNOSIS — R269 Unspecified abnormalities of gait and mobility: Secondary | ICD-10-CM | POA: Diagnosis not present

## 2013-07-11 DIAGNOSIS — R279 Unspecified lack of coordination: Secondary | ICD-10-CM | POA: Diagnosis not present

## 2013-07-11 DIAGNOSIS — M6281 Muscle weakness (generalized): Secondary | ICD-10-CM | POA: Diagnosis not present

## 2013-07-11 DIAGNOSIS — M1710 Unilateral primary osteoarthritis, unspecified knee: Secondary | ICD-10-CM | POA: Diagnosis not present

## 2013-07-11 DIAGNOSIS — R269 Unspecified abnormalities of gait and mobility: Secondary | ICD-10-CM | POA: Diagnosis not present

## 2013-07-11 DIAGNOSIS — IMO0002 Reserved for concepts with insufficient information to code with codable children: Secondary | ICD-10-CM | POA: Diagnosis not present

## 2013-07-14 DIAGNOSIS — IMO0002 Reserved for concepts with insufficient information to code with codable children: Secondary | ICD-10-CM | POA: Diagnosis not present

## 2013-07-14 DIAGNOSIS — M1710 Unilateral primary osteoarthritis, unspecified knee: Secondary | ICD-10-CM | POA: Diagnosis not present

## 2013-07-14 DIAGNOSIS — M6281 Muscle weakness (generalized): Secondary | ICD-10-CM | POA: Diagnosis not present

## 2013-07-14 DIAGNOSIS — R269 Unspecified abnormalities of gait and mobility: Secondary | ICD-10-CM | POA: Diagnosis not present

## 2013-07-14 DIAGNOSIS — R279 Unspecified lack of coordination: Secondary | ICD-10-CM | POA: Diagnosis not present

## 2013-07-20 DIAGNOSIS — IMO0002 Reserved for concepts with insufficient information to code with codable children: Secondary | ICD-10-CM | POA: Diagnosis not present

## 2013-07-20 DIAGNOSIS — R269 Unspecified abnormalities of gait and mobility: Secondary | ICD-10-CM | POA: Diagnosis not present

## 2013-07-20 DIAGNOSIS — M6281 Muscle weakness (generalized): Secondary | ICD-10-CM | POA: Diagnosis not present

## 2013-07-20 DIAGNOSIS — M1710 Unilateral primary osteoarthritis, unspecified knee: Secondary | ICD-10-CM | POA: Diagnosis not present

## 2013-07-20 DIAGNOSIS — R279 Unspecified lack of coordination: Secondary | ICD-10-CM | POA: Diagnosis not present

## 2013-07-22 DIAGNOSIS — M1710 Unilateral primary osteoarthritis, unspecified knee: Secondary | ICD-10-CM | POA: Diagnosis not present

## 2013-07-22 DIAGNOSIS — M6281 Muscle weakness (generalized): Secondary | ICD-10-CM | POA: Diagnosis not present

## 2013-07-22 DIAGNOSIS — R269 Unspecified abnormalities of gait and mobility: Secondary | ICD-10-CM | POA: Diagnosis not present

## 2013-07-22 DIAGNOSIS — R279 Unspecified lack of coordination: Secondary | ICD-10-CM | POA: Diagnosis not present

## 2013-07-22 DIAGNOSIS — IMO0002 Reserved for concepts with insufficient information to code with codable children: Secondary | ICD-10-CM | POA: Diagnosis not present

## 2013-07-25 DIAGNOSIS — M1710 Unilateral primary osteoarthritis, unspecified knee: Secondary | ICD-10-CM | POA: Diagnosis not present

## 2013-07-25 DIAGNOSIS — R279 Unspecified lack of coordination: Secondary | ICD-10-CM | POA: Diagnosis not present

## 2013-07-25 DIAGNOSIS — R269 Unspecified abnormalities of gait and mobility: Secondary | ICD-10-CM | POA: Diagnosis not present

## 2013-07-25 DIAGNOSIS — M6281 Muscle weakness (generalized): Secondary | ICD-10-CM | POA: Diagnosis not present

## 2013-07-25 DIAGNOSIS — IMO0002 Reserved for concepts with insufficient information to code with codable children: Secondary | ICD-10-CM | POA: Diagnosis not present

## 2013-07-27 DIAGNOSIS — M6281 Muscle weakness (generalized): Secondary | ICD-10-CM | POA: Diagnosis not present

## 2013-07-27 DIAGNOSIS — R269 Unspecified abnormalities of gait and mobility: Secondary | ICD-10-CM | POA: Diagnosis not present

## 2013-07-27 DIAGNOSIS — M1710 Unilateral primary osteoarthritis, unspecified knee: Secondary | ICD-10-CM | POA: Diagnosis not present

## 2013-07-27 DIAGNOSIS — IMO0002 Reserved for concepts with insufficient information to code with codable children: Secondary | ICD-10-CM | POA: Diagnosis not present

## 2013-07-27 DIAGNOSIS — R279 Unspecified lack of coordination: Secondary | ICD-10-CM | POA: Diagnosis not present

## 2013-07-29 ENCOUNTER — Other Ambulatory Visit: Payer: Self-pay | Admitting: Cardiology

## 2013-07-29 DIAGNOSIS — R300 Dysuria: Secondary | ICD-10-CM | POA: Diagnosis not present

## 2013-07-29 NOTE — Telephone Encounter (Signed)
Rx request, original script is under Dr. Graciela Husbands, is Dr. Mayford Knife taking this over? I saw an ER encounter, is that why it has her name on the request? Please advise.

## 2013-08-02 ENCOUNTER — Ambulatory Visit (INDEPENDENT_AMBULATORY_CARE_PROVIDER_SITE_OTHER): Payer: Medicare Other | Admitting: *Deleted

## 2013-08-02 ENCOUNTER — Telehealth: Payer: Self-pay | Admitting: Cardiology

## 2013-08-02 DIAGNOSIS — Z950 Presence of cardiac pacemaker: Secondary | ICD-10-CM

## 2013-08-02 NOTE — Progress Notes (Signed)
Pt c/o of severe chest pains slightly above her device. She states the pain was nearly as intense as labor. I spoke with her husband on phone---I asked if they would rather go directly to ER or be seen in our clinic, they chose clinic. Upon arrival to clinic, her symptoms had dissipated.  Device check in clinic, all functions normal, no changes made, full details in [facility].  Reviewed w/ Nehemiah Settle. Instructions given to contact us immediately if symptoms occur during business hours or to go to hospital if occurences happen after hours.   ROV w/ Dr. Mayford Knife 08/10/13 & w/ Dr. Graciela Husbands in 17mo.

## 2013-08-02 NOTE — Telephone Encounter (Signed)
Made appt to see pt today

## 2013-08-02 NOTE — Telephone Encounter (Signed)
New Message  Pt husband called states that the pt had a Pacer since 2013.  Pt husband says that she is crying due to the pain//transferred call to device clinic

## 2013-08-03 ENCOUNTER — Encounter: Payer: Self-pay | Admitting: Internal Medicine

## 2013-08-03 LAB — MDC_IDC_ENUM_SESS_TYPE_INCLINIC
Battery Remaining Longevity: 79 mo
Battery Voltage: 3.02 V
Brady Statistic AP VP Percent: 0.11 %
Brady Statistic AP VS Percent: 0 %
Brady Statistic AS VP Percent: 99.64 %
Brady Statistic AS VS Percent: 0.24 %
Brady Statistic RA Percent Paced: 0.11 %
Brady Statistic RV Percent Paced: 99.76 %
Date Time Interrogation Session: 20141231040854
Lead Channel Impedance Value: 304 Ohm
Lead Channel Impedance Value: 437 Ohm
Lead Channel Impedance Value: 475 Ohm
Lead Channel Impedance Value: 494 Ohm
Lead Channel Impedance Value: 532 Ohm
Lead Channel Impedance Value: 551 Ohm
Lead Channel Impedance Value: 627 Ohm
Lead Channel Impedance Value: 684 Ohm
Lead Channel Impedance Value: 722 Ohm
Lead Channel Pacing Threshold Amplitude: 0.5 V
Lead Channel Pacing Threshold Amplitude: 0.875 V
Lead Channel Pacing Threshold Pulse Width: 0.4 ms
Lead Channel Pacing Threshold Pulse Width: 0.4 ms
Lead Channel Sensing Intrinsic Amplitude: 1.5 mV
Lead Channel Sensing Intrinsic Amplitude: 2.375 mV
Lead Channel Sensing Intrinsic Amplitude: 7 mV
Lead Channel Sensing Intrinsic Amplitude: 9.5 mV
Lead Channel Setting Pacing Amplitude: 1.75 V
Lead Channel Setting Pacing Amplitude: 2 V
Lead Channel Setting Pacing Amplitude: 2.25 V
Lead Channel Setting Pacing Pulse Width: 0.4 ms
Lead Channel Setting Pacing Pulse Width: 0.4 ms
Lead Channel Setting Sensing Sensitivity: 2.8 mV
Zone Setting Detection Interval: 350 ms
Zone Setting Detection Interval: 400 ms

## 2013-08-05 DIAGNOSIS — E538 Deficiency of other specified B group vitamins: Secondary | ICD-10-CM | POA: Diagnosis not present

## 2013-08-07 ENCOUNTER — Other Ambulatory Visit: Payer: Self-pay | Admitting: Cardiology

## 2013-08-08 DIAGNOSIS — R279 Unspecified lack of coordination: Secondary | ICD-10-CM | POA: Diagnosis not present

## 2013-08-08 DIAGNOSIS — IMO0002 Reserved for concepts with insufficient information to code with codable children: Secondary | ICD-10-CM | POA: Diagnosis not present

## 2013-08-08 DIAGNOSIS — M6281 Muscle weakness (generalized): Secondary | ICD-10-CM | POA: Diagnosis not present

## 2013-08-08 DIAGNOSIS — R269 Unspecified abnormalities of gait and mobility: Secondary | ICD-10-CM | POA: Diagnosis not present

## 2013-08-10 ENCOUNTER — Ambulatory Visit (INDEPENDENT_AMBULATORY_CARE_PROVIDER_SITE_OTHER): Payer: Medicare Other | Admitting: Cardiology

## 2013-08-10 ENCOUNTER — Encounter: Payer: Self-pay | Admitting: Cardiology

## 2013-08-10 VITALS — BP 110/60 | HR 92 | Ht 65.0 in | Wt 187.0 lb

## 2013-08-10 DIAGNOSIS — I428 Other cardiomyopathies: Secondary | ICD-10-CM

## 2013-08-10 DIAGNOSIS — I10 Essential (primary) hypertension: Secondary | ICD-10-CM

## 2013-08-10 DIAGNOSIS — I5022 Chronic systolic (congestive) heart failure: Secondary | ICD-10-CM

## 2013-08-10 DIAGNOSIS — I2510 Atherosclerotic heart disease of native coronary artery without angina pectoris: Secondary | ICD-10-CM | POA: Diagnosis not present

## 2013-08-10 DIAGNOSIS — I509 Heart failure, unspecified: Secondary | ICD-10-CM

## 2013-08-10 DIAGNOSIS — Z950 Presence of cardiac pacemaker: Secondary | ICD-10-CM

## 2013-08-10 DIAGNOSIS — E785 Hyperlipidemia, unspecified: Secondary | ICD-10-CM

## 2013-08-10 DIAGNOSIS — I429 Cardiomyopathy, unspecified: Secondary | ICD-10-CM

## 2013-08-10 NOTE — Patient Instructions (Signed)
Your physician recommends that you continue on your current medications as directed. Please refer to the Current Medication list given to you today.  Your physician recommends that you return for lab work on 08/24/13. This is a Fasting Panel. Our lab opens at 8:00 am.   Your physician wants you to follow-up in: 6 Months with Dr Mallie Snooks will receive a reminder letter in the mail two months in advance. If you don't receive a letter, please call our office to schedule the follow-up appointment.

## 2013-08-10 NOTE — Progress Notes (Signed)
[address] [place], [place]  [address] Phone: [phone] Fax:  [phone]  Date:  08/10/2013   ID:  [name], DOB March 23, 1923, MRN [id number]  PCP:  Simona Huh, MD  Cardiologist:  Fransico Him, MD     History of Present Illness: [name] is a 80 y.o. female with a history of ASCAD, ischemic DCM, LBBB, BiVPPM, HTN, dyslipidemia and chronic systolic CHF who presents today for followup.  She is doing well.  She denies any anginal chest pain, SOB, DOE, dizziness, palpitation or syncope.  She says that she recently had an episode of severe sharp pain over her pacer site and came in for pacer check which was fine and she has not had any pain since.  She has chronic LE edema which is stable.     Wt Readings from Last 3 Encounters:  08/10/13 187 lb (84.823 kg)  03/07/13 189 lb 9.6 oz (86.002 kg)  02/24/13 169 lb 8 oz (76.885 kg)     Past Medical History  Diagnosis Date  . Macular degeneration   . Osteopenia   . Vitamin B12 deficiency   . Breast cancer     bilaterally  . Cardiomyopathy     Mixed CM; s/p St. Jude CRT-P implant 10/24 w/ LV lead revision 10/25   . LBBB (left bundle branch block)   . Chronic systolic CHF (congestive heart failure)   . Hypertension   . Pacemaker - BiV   . GERD (gastroesophageal reflux disease)   . Umbilical hernia     Repaired on 01/10/2013   . Hematuria 02/23/2013    w/clots required hospital admission on 02/23/2013  . Exertional dyspnea     "before stent in 2010" (02/23/2013)  . Osteoarthritis     "right knee" (02/23/2013)  . CAD (coronary artery disease)     w/ DES to LAD with residual diagonal disease to small for PCI 6/11 and repeat DES to LAD for restenosis 3/12    Current Outpatient Prescriptions  Medication Sig Dispense Refill  . alendronate (FOSAMAX) 70 MG tablet Take 70 mg by mouth every 7 (seven) days. Take in the morning with a full glass of water, on an empty stomach, and do not take anything else by mouth or  lie down for the next 30 min. On Wednesday      . aspirin EC 81 MG tablet Take 81 mg by mouth daily.      . carvedilol (COREG) 3.125 MG tablet Take 3.125 mg by mouth 2 (two) times daily with a meal.      . Cholecalciferol (VITAMIN D-3) 5000 UNITS TABS Take 5,000 Units by mouth daily.       . clopidogrel (PLAVIX) 75 MG tablet TAKE ONE TABLET BY MOUTH ONE TIME DAILY  30 tablet  0  . furosemide (LASIX) 20 MG tablet TAKE ONE TABLET BY MOUTH ONE TIME DAILY.  90 tablet  0  . isosorbide mononitrate (IMDUR) 60 MG 24 hr tablet TAKE ONE AND ONE-HALF TABLETS BY MOUTH DAILY.  135 tablet  1  . Multiple Vitamins-Minerals (OCUVITE PRESERVISION PO) Take 1 capsule by mouth 2 (two) times daily.      . nitroGLYCERIN (NITROSTAT) 0.4 MG SL tablet Place 0.4 mg under the tongue every 5 (five) minutes as needed for chest pain.       . ramipril (ALTACE) 2.5 MG capsule TAKE ONE CAPSULE BY MOUTH ONE TIME DAILY  30 capsule  6  . simvastatin (ZOCOR) 10  MG tablet Take 10 mg by mouth at bedtime.       . traMADol (ULTRAM) 50 MG tablet Take 100 mg by mouth See admin instructions. Take two tablets in the morning, and if needed, two more in the evening for pain.       No current facility-administered medications for this visit.    Allergies:   No Known Allergies  Social History:  The patient  reports that she quit smoking about 50 years ago. Her smoking use included Cigarettes. She has a 27 pack-year smoking history. She has never used smokeless tobacco. She reports that she drinks about 3.0 ounces of alcohol per week. She reports that she does not use illicit drugs.   Family History:  The patient's family history includes Heart disease in her father and mother.   ROS:  Please see the history of present illness.      All other systems reviewed and negative.   PHYSICAL EXAM: VS:  BP 110/60  Pulse 92  Ht 5\' 5"  (1.651 m)  Wt 187 lb (84.823 kg)  BMI 31.12 kg/m2 Well nourished, well developed, in no acute distress HEENT:  normal Neck: no JVD Cardiac:  normal S1, S2; RRR; no murmur Lungs:  clear to auscultation bilaterally, no wheezing, rhonchi or rales Abd: soft, nontender, no hepatomegaly Ext: 1+ edema Skin: warm and dry Neuro:  CNs 2-12 intact, no focal abnormalities noted  EKG:     NSR with V paced rhythm  ASSESSMENT AND PLAN:  1. ASCAD with no anginal CP  - continue ASA/Imdur /Plavix 2. HTN - well controlled  - continue Ramipril/carvedilol 3. Dyslipidemia  - continue simvastatin  - check fasting lipids and ALT 4. Chronic systolic CHF - appears well compensated  - continue coreg/ramipril/Lasix 5. Ischemic/nonischemic DCM EF 35-40% by cath 2012 and 30% by echo 2013 and now 73% by recent nuclear stress test 11/2012  Signed, Fransico Him, MD 08/10/2013 11:14 AM

## 2013-08-20 ENCOUNTER — Other Ambulatory Visit: Payer: Self-pay | Admitting: Cardiology

## 2013-08-22 ENCOUNTER — Other Ambulatory Visit: Payer: Self-pay | Admitting: Cardiology

## 2013-08-24 ENCOUNTER — Other Ambulatory Visit (INDEPENDENT_AMBULATORY_CARE_PROVIDER_SITE_OTHER): Payer: Medicare Other

## 2013-08-24 DIAGNOSIS — E785 Hyperlipidemia, unspecified: Secondary | ICD-10-CM

## 2013-08-24 LAB — LIPID PANEL
Cholesterol: 142 mg/dL (ref 0–200)
HDL: 56.7 mg/dL (ref >39.00)
LDL Cholesterol: 68 mg/dL (ref 0–99)
Total CHOL/HDL Ratio: 3
Triglycerides: 87 mg/dL (ref 0.0–149.0)
VLDL: 17.4 mg/dL (ref 0.0–40.0)

## 2013-08-24 LAB — ALT: ALT: 12 U/L (ref 0–35)

## 2013-08-25 ENCOUNTER — Other Ambulatory Visit: Payer: Self-pay | Admitting: General Surgery

## 2013-08-25 ENCOUNTER — Encounter: Payer: Self-pay | Admitting: General Surgery

## 2013-08-25 DIAGNOSIS — E785 Hyperlipidemia, unspecified: Secondary | ICD-10-CM

## 2013-09-02 DIAGNOSIS — E538 Deficiency of other specified B group vitamins: Secondary | ICD-10-CM | POA: Diagnosis not present

## 2013-09-04 ENCOUNTER — Other Ambulatory Visit: Payer: Self-pay | Admitting: Cardiology

## 2013-09-07 DIAGNOSIS — M6281 Muscle weakness (generalized): Secondary | ICD-10-CM | POA: Diagnosis not present

## 2013-09-07 DIAGNOSIS — R269 Unspecified abnormalities of gait and mobility: Secondary | ICD-10-CM | POA: Diagnosis not present

## 2013-09-07 DIAGNOSIS — M1710 Unilateral primary osteoarthritis, unspecified knee: Secondary | ICD-10-CM | POA: Diagnosis not present

## 2013-09-07 DIAGNOSIS — R279 Unspecified lack of coordination: Secondary | ICD-10-CM | POA: Diagnosis not present

## 2013-09-07 DIAGNOSIS — IMO0002 Reserved for concepts with insufficient information to code with codable children: Secondary | ICD-10-CM | POA: Diagnosis not present

## 2013-09-14 DIAGNOSIS — IMO0002 Reserved for concepts with insufficient information to code with codable children: Secondary | ICD-10-CM | POA: Diagnosis not present

## 2013-09-14 DIAGNOSIS — M6281 Muscle weakness (generalized): Secondary | ICD-10-CM | POA: Diagnosis not present

## 2013-09-14 DIAGNOSIS — M1710 Unilateral primary osteoarthritis, unspecified knee: Secondary | ICD-10-CM | POA: Diagnosis not present

## 2013-09-14 DIAGNOSIS — R279 Unspecified lack of coordination: Secondary | ICD-10-CM | POA: Diagnosis not present

## 2013-09-14 DIAGNOSIS — R269 Unspecified abnormalities of gait and mobility: Secondary | ICD-10-CM | POA: Diagnosis not present

## 2013-09-16 DIAGNOSIS — M1710 Unilateral primary osteoarthritis, unspecified knee: Secondary | ICD-10-CM | POA: Diagnosis not present

## 2013-09-16 DIAGNOSIS — R279 Unspecified lack of coordination: Secondary | ICD-10-CM | POA: Diagnosis not present

## 2013-09-16 DIAGNOSIS — R269 Unspecified abnormalities of gait and mobility: Secondary | ICD-10-CM | POA: Diagnosis not present

## 2013-09-16 DIAGNOSIS — M6281 Muscle weakness (generalized): Secondary | ICD-10-CM | POA: Diagnosis not present

## 2013-09-16 DIAGNOSIS — IMO0002 Reserved for concepts with insufficient information to code with codable children: Secondary | ICD-10-CM | POA: Diagnosis not present

## 2013-09-19 DIAGNOSIS — IMO0002 Reserved for concepts with insufficient information to code with codable children: Secondary | ICD-10-CM | POA: Diagnosis not present

## 2013-09-19 DIAGNOSIS — R279 Unspecified lack of coordination: Secondary | ICD-10-CM | POA: Diagnosis not present

## 2013-09-19 DIAGNOSIS — M1710 Unilateral primary osteoarthritis, unspecified knee: Secondary | ICD-10-CM | POA: Diagnosis not present

## 2013-09-19 DIAGNOSIS — M6281 Muscle weakness (generalized): Secondary | ICD-10-CM | POA: Diagnosis not present

## 2013-09-19 DIAGNOSIS — R269 Unspecified abnormalities of gait and mobility: Secondary | ICD-10-CM | POA: Diagnosis not present

## 2013-09-21 DIAGNOSIS — R279 Unspecified lack of coordination: Secondary | ICD-10-CM | POA: Diagnosis not present

## 2013-09-21 DIAGNOSIS — R269 Unspecified abnormalities of gait and mobility: Secondary | ICD-10-CM | POA: Diagnosis not present

## 2013-09-21 DIAGNOSIS — M6281 Muscle weakness (generalized): Secondary | ICD-10-CM | POA: Diagnosis not present

## 2013-09-21 DIAGNOSIS — IMO0002 Reserved for concepts with insufficient information to code with codable children: Secondary | ICD-10-CM | POA: Diagnosis not present

## 2013-09-21 DIAGNOSIS — M1710 Unilateral primary osteoarthritis, unspecified knee: Secondary | ICD-10-CM | POA: Diagnosis not present

## 2013-09-27 DIAGNOSIS — M1710 Unilateral primary osteoarthritis, unspecified knee: Secondary | ICD-10-CM | POA: Diagnosis not present

## 2013-09-27 DIAGNOSIS — IMO0002 Reserved for concepts with insufficient information to code with codable children: Secondary | ICD-10-CM | POA: Diagnosis not present

## 2013-09-27 DIAGNOSIS — R269 Unspecified abnormalities of gait and mobility: Secondary | ICD-10-CM | POA: Diagnosis not present

## 2013-09-27 DIAGNOSIS — M6281 Muscle weakness (generalized): Secondary | ICD-10-CM | POA: Diagnosis not present

## 2013-09-27 DIAGNOSIS — R279 Unspecified lack of coordination: Secondary | ICD-10-CM | POA: Diagnosis not present

## 2013-09-28 DIAGNOSIS — E538 Deficiency of other specified B group vitamins: Secondary | ICD-10-CM | POA: Diagnosis not present

## 2013-10-04 DIAGNOSIS — M1710 Unilateral primary osteoarthritis, unspecified knee: Secondary | ICD-10-CM | POA: Diagnosis not present

## 2013-10-04 DIAGNOSIS — IMO0002 Reserved for concepts with insufficient information to code with codable children: Secondary | ICD-10-CM | POA: Diagnosis not present

## 2013-10-04 DIAGNOSIS — R279 Unspecified lack of coordination: Secondary | ICD-10-CM | POA: Diagnosis not present

## 2013-10-04 DIAGNOSIS — M6281 Muscle weakness (generalized): Secondary | ICD-10-CM | POA: Diagnosis not present

## 2013-10-04 DIAGNOSIS — R269 Unspecified abnormalities of gait and mobility: Secondary | ICD-10-CM | POA: Diagnosis not present

## 2013-10-06 DIAGNOSIS — R269 Unspecified abnormalities of gait and mobility: Secondary | ICD-10-CM | POA: Diagnosis not present

## 2013-10-06 DIAGNOSIS — IMO0002 Reserved for concepts with insufficient information to code with codable children: Secondary | ICD-10-CM | POA: Diagnosis not present

## 2013-10-06 DIAGNOSIS — M6281 Muscle weakness (generalized): Secondary | ICD-10-CM | POA: Diagnosis not present

## 2013-10-06 DIAGNOSIS — R279 Unspecified lack of coordination: Secondary | ICD-10-CM | POA: Diagnosis not present

## 2013-10-06 DIAGNOSIS — M1710 Unilateral primary osteoarthritis, unspecified knee: Secondary | ICD-10-CM | POA: Diagnosis not present

## 2013-10-10 DIAGNOSIS — M6281 Muscle weakness (generalized): Secondary | ICD-10-CM | POA: Diagnosis not present

## 2013-10-10 DIAGNOSIS — R269 Unspecified abnormalities of gait and mobility: Secondary | ICD-10-CM | POA: Diagnosis not present

## 2013-10-10 DIAGNOSIS — R279 Unspecified lack of coordination: Secondary | ICD-10-CM | POA: Diagnosis not present

## 2013-10-10 DIAGNOSIS — M1710 Unilateral primary osteoarthritis, unspecified knee: Secondary | ICD-10-CM | POA: Diagnosis not present

## 2013-10-10 DIAGNOSIS — IMO0002 Reserved for concepts with insufficient information to code with codable children: Secondary | ICD-10-CM | POA: Diagnosis not present

## 2013-10-12 DIAGNOSIS — M6281 Muscle weakness (generalized): Secondary | ICD-10-CM | POA: Diagnosis not present

## 2013-10-12 DIAGNOSIS — R279 Unspecified lack of coordination: Secondary | ICD-10-CM | POA: Diagnosis not present

## 2013-10-12 DIAGNOSIS — IMO0002 Reserved for concepts with insufficient information to code with codable children: Secondary | ICD-10-CM | POA: Diagnosis not present

## 2013-10-12 DIAGNOSIS — R269 Unspecified abnormalities of gait and mobility: Secondary | ICD-10-CM | POA: Diagnosis not present

## 2013-10-12 DIAGNOSIS — M1710 Unilateral primary osteoarthritis, unspecified knee: Secondary | ICD-10-CM | POA: Diagnosis not present

## 2013-10-13 DIAGNOSIS — R300 Dysuria: Secondary | ICD-10-CM | POA: Diagnosis not present

## 2013-10-17 DIAGNOSIS — R279 Unspecified lack of coordination: Secondary | ICD-10-CM | POA: Diagnosis not present

## 2013-10-17 DIAGNOSIS — M6281 Muscle weakness (generalized): Secondary | ICD-10-CM | POA: Diagnosis not present

## 2013-10-17 DIAGNOSIS — R269 Unspecified abnormalities of gait and mobility: Secondary | ICD-10-CM | POA: Diagnosis not present

## 2013-10-17 DIAGNOSIS — M1710 Unilateral primary osteoarthritis, unspecified knee: Secondary | ICD-10-CM | POA: Diagnosis not present

## 2013-10-17 DIAGNOSIS — IMO0002 Reserved for concepts with insufficient information to code with codable children: Secondary | ICD-10-CM | POA: Diagnosis not present

## 2013-10-19 DIAGNOSIS — IMO0002 Reserved for concepts with insufficient information to code with codable children: Secondary | ICD-10-CM | POA: Diagnosis not present

## 2013-10-19 DIAGNOSIS — R279 Unspecified lack of coordination: Secondary | ICD-10-CM | POA: Diagnosis not present

## 2013-10-19 DIAGNOSIS — M1710 Unilateral primary osteoarthritis, unspecified knee: Secondary | ICD-10-CM | POA: Diagnosis not present

## 2013-10-19 DIAGNOSIS — M6281 Muscle weakness (generalized): Secondary | ICD-10-CM | POA: Diagnosis not present

## 2013-10-19 DIAGNOSIS — R269 Unspecified abnormalities of gait and mobility: Secondary | ICD-10-CM | POA: Diagnosis not present

## 2013-10-25 DIAGNOSIS — IMO0002 Reserved for concepts with insufficient information to code with codable children: Secondary | ICD-10-CM | POA: Diagnosis not present

## 2013-10-25 DIAGNOSIS — R279 Unspecified lack of coordination: Secondary | ICD-10-CM | POA: Diagnosis not present

## 2013-10-25 DIAGNOSIS — M1710 Unilateral primary osteoarthritis, unspecified knee: Secondary | ICD-10-CM | POA: Diagnosis not present

## 2013-10-25 DIAGNOSIS — M6281 Muscle weakness (generalized): Secondary | ICD-10-CM | POA: Diagnosis not present

## 2013-10-25 DIAGNOSIS — R269 Unspecified abnormalities of gait and mobility: Secondary | ICD-10-CM | POA: Diagnosis not present

## 2013-10-26 DIAGNOSIS — E538 Deficiency of other specified B group vitamins: Secondary | ICD-10-CM | POA: Diagnosis not present

## 2013-10-27 DIAGNOSIS — M1710 Unilateral primary osteoarthritis, unspecified knee: Secondary | ICD-10-CM | POA: Diagnosis not present

## 2013-10-27 DIAGNOSIS — IMO0002 Reserved for concepts with insufficient information to code with codable children: Secondary | ICD-10-CM | POA: Diagnosis not present

## 2013-10-27 DIAGNOSIS — R269 Unspecified abnormalities of gait and mobility: Secondary | ICD-10-CM | POA: Diagnosis not present

## 2013-10-27 DIAGNOSIS — R279 Unspecified lack of coordination: Secondary | ICD-10-CM | POA: Diagnosis not present

## 2013-10-27 DIAGNOSIS — M6281 Muscle weakness (generalized): Secondary | ICD-10-CM | POA: Diagnosis not present

## 2013-11-03 ENCOUNTER — Other Ambulatory Visit: Payer: Self-pay | Admitting: Cardiology

## 2013-11-11 ENCOUNTER — Encounter: Payer: Self-pay | Admitting: *Deleted

## 2013-11-14 ENCOUNTER — Other Ambulatory Visit: Payer: Medicare Other

## 2013-11-17 ENCOUNTER — Other Ambulatory Visit: Payer: Self-pay | Admitting: Cardiology

## 2013-11-23 DIAGNOSIS — E538 Deficiency of other specified B group vitamins: Secondary | ICD-10-CM | POA: Diagnosis not present

## 2013-12-12 ENCOUNTER — Other Ambulatory Visit: Payer: Self-pay | Admitting: Family Medicine

## 2013-12-12 ENCOUNTER — Ambulatory Visit
Admission: RE | Admit: 2013-12-12 | Discharge: 2013-12-12 | Disposition: A | Discharge status: Home / Self Care | Payer: Medicare Other | Source: Ambulatory Visit | Attending: Family Medicine | Admitting: Family Medicine

## 2013-12-12 DIAGNOSIS — R52 Pain, unspecified: Secondary | ICD-10-CM

## 2013-12-12 DIAGNOSIS — M546 Pain in thoracic spine: Secondary | ICD-10-CM | POA: Diagnosis not present

## 2013-12-12 DIAGNOSIS — M439 Deforming dorsopathy, unspecified: Secondary | ICD-10-CM | POA: Diagnosis not present

## 2013-12-16 DIAGNOSIS — S22009A Unspecified fracture of unspecified thoracic vertebra, initial encounter for closed fracture: Secondary | ICD-10-CM | POA: Diagnosis not present

## 2014-01-06 ENCOUNTER — Encounter: Payer: Self-pay | Admitting: Cardiology

## 2014-01-09 ENCOUNTER — Telehealth: Payer: Self-pay | Admitting: Internal Medicine

## 2014-01-09 DIAGNOSIS — S22009A Unspecified fracture of unspecified thoracic vertebra, initial encounter for closed fracture: Secondary | ICD-10-CM | POA: Diagnosis not present

## 2014-01-09 DIAGNOSIS — E538 Deficiency of other specified B group vitamins: Secondary | ICD-10-CM | POA: Diagnosis not present

## 2014-01-09 NOTE — Telephone Encounter (Signed)
[id number] certified letter sent regarding pt's past due device check/mt

## 2014-01-19 DIAGNOSIS — H353190 Nonexudative age-related macular degeneration, unspecified eye, stage unspecified: Secondary | ICD-10-CM | POA: Diagnosis not present

## 2014-01-19 DIAGNOSIS — H31019 Macula scars of posterior pole (postinflammatory) (post-traumatic), unspecified eye: Secondary | ICD-10-CM | POA: Diagnosis not present

## 2014-01-19 DIAGNOSIS — Z961 Presence of intraocular lens: Secondary | ICD-10-CM | POA: Diagnosis not present

## 2014-01-19 DIAGNOSIS — H353290 Exudative age-related macular degeneration, unspecified eye, stage unspecified: Secondary | ICD-10-CM | POA: Diagnosis not present

## 2014-01-20 ENCOUNTER — Other Ambulatory Visit: Payer: Self-pay | Admitting: Cardiology

## 2014-02-01 DIAGNOSIS — R350 Frequency of micturition: Secondary | ICD-10-CM | POA: Diagnosis not present

## 2014-02-08 ENCOUNTER — Other Ambulatory Visit: Payer: Self-pay | Admitting: Cardiology

## 2014-02-15 ENCOUNTER — Encounter: Payer: Medicare Other | Admitting: Internal Medicine

## 2014-02-22 ENCOUNTER — Other Ambulatory Visit: Payer: Self-pay | Admitting: Cardiology

## 2014-02-22 ENCOUNTER — Other Ambulatory Visit: Payer: Medicare Other

## 2014-02-23 ENCOUNTER — Encounter: Payer: Self-pay | Admitting: Cardiology

## 2014-02-23 NOTE — Telephone Encounter (Signed)
This encounter was created in error - please disregard.

## 2014-03-14 ENCOUNTER — Other Ambulatory Visit: Payer: Self-pay | Admitting: Cardiology

## 2014-04-17 DIAGNOSIS — H906 Mixed conductive and sensorineural hearing loss, bilateral: Secondary | ICD-10-CM | POA: Diagnosis not present

## 2014-04-19 ENCOUNTER — Encounter: Payer: Self-pay | Admitting: Cardiology

## 2014-04-19 ENCOUNTER — Ambulatory Visit (INDEPENDENT_AMBULATORY_CARE_PROVIDER_SITE_OTHER): Payer: Medicare Other | Admitting: Cardiology

## 2014-04-19 VITALS — BP 146/86 | HR 87 | Ht 65.0 in | Wt 180.0 lb

## 2014-04-19 DIAGNOSIS — I428 Other cardiomyopathies: Secondary | ICD-10-CM

## 2014-04-19 DIAGNOSIS — I5022 Chronic systolic (congestive) heart failure: Secondary | ICD-10-CM | POA: Diagnosis not present

## 2014-04-19 DIAGNOSIS — I429 Cardiomyopathy, unspecified: Secondary | ICD-10-CM

## 2014-04-19 DIAGNOSIS — I10 Essential (primary) hypertension: Secondary | ICD-10-CM | POA: Diagnosis not present

## 2014-04-19 DIAGNOSIS — I2510 Atherosclerotic heart disease of native coronary artery without angina pectoris: Secondary | ICD-10-CM | POA: Diagnosis not present

## 2014-04-19 DIAGNOSIS — E785 Hyperlipidemia, unspecified: Secondary | ICD-10-CM

## 2014-04-19 DIAGNOSIS — I509 Heart failure, unspecified: Secondary | ICD-10-CM

## 2014-04-19 DIAGNOSIS — R0602 Shortness of breath: Secondary | ICD-10-CM

## 2014-04-19 MED ORDER — RAMIPRIL 5 MG PO CAPS: 5.0000 mg | ORAL_CAPSULE | Freq: Every day | ORAL | Status: DC

## 2014-04-19 NOTE — Patient Instructions (Signed)
Your physician recommends that you return for lab work on 04/26/14 for bnp, bmet, lipid and alt.   Your physician has recommended you make the following change in your medication:   1. Increase Ramipril to 5 mg 1 capsule daily.   Your physician has requested that you have an echocardiogram. Echocardiography is a painless test that uses sound waves to create images of your heart. It provides your doctor with information about the size and shape of your heart and how well your heart's chambers and valves are working. This procedure takes approximately one hour. There are no restrictions for this procedure.  Your physician wants you to follow-up in: 6 months with Dr. Radford Pax. You will receive a reminder letter in the mail two months in advance. If you don't receive a letter, please call our office to schedule the follow-up appointment.

## 2014-04-19 NOTE — Progress Notes (Signed)
[address], [place], [place]  [address] Phone: [phone] Fax:  [phone]  Date:  04/19/2014   ID:  [name], DOB 03-17-1923, MRN [id number]  PCP:  Simona Huh, MD  Cardiologist:  Fransico Him, MD     History of Present Illness: [name] is a 80 y.o. female with a history of ASCAD, ischemic DCM, LBBB, BiVPPM, HTN, dyslipidemia and chronic systolic CHF who presents today for followup. She is doing well. She denies any anginal chest pain, dizziness, palpitation or syncope.  She has chronic LE edema which is stable. She has had some SOB since she has had to take care of her husband who fell.      Wt Readings from Last 3 Encounters:  04/19/14 180 lb (81.647 kg)  08/10/13 187 lb (84.823 kg)  03/07/13 189 lb 9.6 oz (86.002 kg)     Past Medical History  Diagnosis Date  . Macular degeneration   . Mild mitral regurgitation     Mild TR   . Osteopenia   . LVF (left ventricular failure)     Mild, w/ PASP 30mmHg  . Vitamin B12 deficiency   . Breast cancer     bilaterally  . Cardiomyopathy     Mixed CM; s/p St. Jude CRT-P implant 10/24 w/ LV lead revision 10/25   . LBBB (left bundle branch block)   . Chronic systolic CHF (congestive heart failure)   . Hypertension   . Pacemaker   . GERD (gastroesophageal reflux disease)     occ  . Umbilical hernia     Repaired on 01/10/2013   . Anginal pain     "q 6 months or so" (02/23/2013)  . Hematuria 02/23/2013    w/clots required hospital admission on 02/23/2013  . Exertional dyspnea     "before stent in 2010" (02/23/2013)  . Osteoarthritis     "right knee" (02/23/2013)  . CAD (coronary artery disease)     w/ DES to LAD with residual diagonal disease to small for PCI 6/11 and repeat DES to LAD for restenosis 3/12    Current Outpatient Prescriptions  Medication Sig Dispense Refill  . alendronate (FOSAMAX) 70 MG tablet Take 70 mg by mouth every 7 (seven) days. Take in the morning with a full glass of water, on  an empty stomach, and do not take anything else by mouth or lie down for the next 30 min. On Wednesday      . aspirin EC 81 MG tablet Take 81 mg by mouth daily.      . carvedilol (COREG) 3.125 MG tablet TAKE ONE TABLET TWICE DAILY  180 tablet  0  . Cholecalciferol (VITAMIN D-3) 5000 UNITS TABS Take 5,000 Units by mouth daily.       . clopidogrel (PLAVIX) 75 MG tablet TAKE ONE TABLET EACH DAY  30 tablet  6  . furosemide (LASIX) 20 MG tablet TAKE 1 TABLET BY MOUTH ONE TIME DAILY  90 tablet  0  . isosorbide mononitrate (IMDUR) 60 MG 24 hr tablet TAKE ONE AND ONE-HALF TABLETS DAILY  135 tablet  3  . nitroGLYCERIN (NITROSTAT) 0.4 MG SL tablet Place 0.4 mg under the tongue every 5 (five) minutes as needed for chest pain.       . ramipril (ALTACE) 2.5 MG capsule TAKE ONE CAPSULE EACH DAY  30 capsule  1  . simvastatin (ZOCOR) 10 MG tablet TAKE ONE TABLET IN THE EVENING  90 tablet  0  .  traMADol (ULTRAM) 50 MG tablet Take 100 mg by mouth See admin instructions. Take two tablets in the morning, and if needed, two more in the evening for pain.       No current facility-administered medications for this visit.    Allergies:   No Known Allergies  Social History:  The patient  reports that she quit smoking about 51 years ago. Her smoking use included Cigarettes. She has a 27 pack-year smoking history. She has never used smokeless tobacco. She reports that she drinks about 3 ounces of alcohol per week. She reports that she does not use illicit drugs.   Family History:  The patient's family history includes Heart disease in her father and mother.   ROS:  Please see the history of present illness.      All other systems reviewed and negative.   PHYSICAL EXAM: VS:  BP 146/86  Pulse 87  Ht 5\' 5"  (1.651 m)  Wt 180 lb (81.647 kg)  BMI 29.95 kg/m2 Well nourished, well developed, in no acute distress HEENT: normal Neck: no JVD Cardiac:  normal S1, S2; RRR; no murmur Lungs:  clear to auscultation  bilaterally, no wheezing, rhonchi or rales Abd: soft, nontender, no hepatomegaly Ext: no edema Skin: warm and dry Neuro:  CNs 2-12 intact, no focal abnormalities noted  EKG:  NSR with V paced rhythm and PVC's  ASSESSMENT AND PLAN:  1. ASCAD with no anginal CP but has had some SOB - continue ASA/Imdur /Plavix  2. HTN - borderline control - continue Ramipril/carvedilol  - increase Altace to 5mg  daily and recheck BMET in 1 week 3. Dyslipidemia - continue simvastatin  - check fasting lipids and ALT        4.  SOB - she attributes this to having to take care of her husband since he fell 5. Chronic systolic CHF - appears well compensated on exam but since she is having more SOB I will check a 2D echo to reassess LVF and check BNP.   -continue coreg/ramipril/Lasix        6.  Ischemic/nonischemic DCM EF 35-40% by cath 2012 and 30% by echo 2013 and now 73% by nuclear stress test 11/2012.       Followup with me in 6 months   Signed, Fransico Him, MD 04/19/2014 11:38 AM

## 2014-04-26 ENCOUNTER — Other Ambulatory Visit: Payer: Medicare Other

## 2014-04-26 ENCOUNTER — Ambulatory Visit (HOSPITAL_COMMUNITY): Payer: Medicare Other | Attending: Family Medicine | Admitting: Cardiology

## 2014-04-26 ENCOUNTER — Other Ambulatory Visit (INDEPENDENT_AMBULATORY_CARE_PROVIDER_SITE_OTHER): Payer: Medicare Other

## 2014-04-26 DIAGNOSIS — R0602 Shortness of breath: Secondary | ICD-10-CM

## 2014-04-26 DIAGNOSIS — I2510 Atherosclerotic heart disease of native coronary artery without angina pectoris: Secondary | ICD-10-CM | POA: Insufficient documentation

## 2014-04-26 DIAGNOSIS — I509 Heart failure, unspecified: Secondary | ICD-10-CM | POA: Insufficient documentation

## 2014-04-26 DIAGNOSIS — I447 Left bundle-branch block, unspecified: Secondary | ICD-10-CM | POA: Diagnosis not present

## 2014-04-26 DIAGNOSIS — I10 Essential (primary) hypertension: Secondary | ICD-10-CM | POA: Diagnosis not present

## 2014-04-26 DIAGNOSIS — I428 Other cardiomyopathies: Secondary | ICD-10-CM | POA: Diagnosis not present

## 2014-04-26 DIAGNOSIS — E785 Hyperlipidemia, unspecified: Secondary | ICD-10-CM

## 2014-04-26 LAB — BASIC METABOLIC PANEL
BUN: 29 mg/dL — ABNORMAL HIGH (ref 6–23)
CO2: 31 mEq/L (ref 19–32)
Calcium: 9.2 mg/dL (ref 8.4–10.5)
Chloride: 102 mEq/L (ref 96–112)
Creatinine, Ser: 1.1 mg/dL (ref 0.4–1.2)
GFR: 48.67 mL/min — ABNORMAL LOW (ref >60.00)
Glucose, Bld: 101 mg/dL — ABNORMAL HIGH (ref 70–99)
Potassium: 4 mEq/L (ref 3.5–5.1)
Sodium: 140 mEq/L (ref 135–145)

## 2014-04-26 LAB — LIPID PANEL
Cholesterol: 153 mg/dL (ref 0–200)
HDL: 54.1 mg/dL (ref >39.00)
LDL Cholesterol: 78 mg/dL (ref 0–99)
NonHDL: 98.9
Total CHOL/HDL Ratio: 3
Triglycerides: 104 mg/dL (ref 0.0–149.0)
VLDL: 20.8 mg/dL (ref 0.0–40.0)

## 2014-04-26 LAB — ALT: ALT: 17 U/L (ref 0–35)

## 2014-04-26 LAB — BRAIN NATRIURETIC PEPTIDE: Pro B Natriuretic peptide (BNP): 120 pg/mL — ABNORMAL HIGH (ref 0.0–100.0)

## 2014-04-26 NOTE — Progress Notes (Signed)
Echo performed. 

## 2014-04-28 ENCOUNTER — Encounter: Payer: Self-pay | Admitting: General Surgery

## 2014-04-28 ENCOUNTER — Other Ambulatory Visit: Payer: Self-pay | Admitting: General Surgery

## 2014-04-28 DIAGNOSIS — E785 Hyperlipidemia, unspecified: Secondary | ICD-10-CM

## 2014-04-28 MED ORDER — SIMVASTATIN 20 MG PO TABS: 20.0000 mg | ORAL_TABLET | Freq: Every day | ORAL | Status: DC

## 2014-05-09 DIAGNOSIS — Z23 Encounter for immunization: Secondary | ICD-10-CM | POA: Diagnosis not present

## 2014-05-13 ENCOUNTER — Other Ambulatory Visit: Payer: Self-pay | Admitting: Cardiology

## 2014-05-24 DIAGNOSIS — E538 Deficiency of other specified B group vitamins: Secondary | ICD-10-CM | POA: Diagnosis not present

## 2014-05-31 DIAGNOSIS — M1711 Unilateral primary osteoarthritis, right knee: Secondary | ICD-10-CM | POA: Diagnosis not present

## 2014-06-09 ENCOUNTER — Other Ambulatory Visit: Payer: Medicare Other

## 2014-06-16 ENCOUNTER — Other Ambulatory Visit (INDEPENDENT_AMBULATORY_CARE_PROVIDER_SITE_OTHER): Payer: Medicare Other | Admitting: *Deleted

## 2014-06-16 DIAGNOSIS — E785 Hyperlipidemia, unspecified: Secondary | ICD-10-CM | POA: Diagnosis not present

## 2014-06-16 LAB — LIPID PANEL
Cholesterol: 117 mg/dL (ref 0–200)
HDL: 43 mg/dL (ref >39.00)
LDL Cholesterol: 59 mg/dL (ref 0–99)
NonHDL: 74
Total CHOL/HDL Ratio: 3
Triglycerides: 74 mg/dL (ref 0.0–149.0)
VLDL: 14.8 mg/dL (ref 0.0–40.0)

## 2014-06-16 LAB — ALT: ALT: 15 U/L (ref 0–35)

## 2014-06-21 DIAGNOSIS — E538 Deficiency of other specified B group vitamins: Secondary | ICD-10-CM | POA: Diagnosis not present

## 2014-06-23 ENCOUNTER — Other Ambulatory Visit: Payer: Self-pay | Admitting: Cardiology

## 2014-07-06 ENCOUNTER — Ambulatory Visit (INDEPENDENT_AMBULATORY_CARE_PROVIDER_SITE_OTHER): Payer: Medicare Other | Admitting: Internal Medicine

## 2014-07-06 ENCOUNTER — Other Ambulatory Visit: Payer: Self-pay | Admitting: Internal Medicine

## 2014-07-06 ENCOUNTER — Encounter: Payer: Self-pay | Admitting: Internal Medicine

## 2014-07-06 VITALS — BP 136/60 | HR 75 | Ht 65.0 in | Wt 176.0 lb

## 2014-07-06 DIAGNOSIS — Z45018 Encounter for adjustment and management of other part of cardiac pacemaker: Secondary | ICD-10-CM

## 2014-07-06 DIAGNOSIS — I5022 Chronic systolic (congestive) heart failure: Secondary | ICD-10-CM

## 2014-07-06 DIAGNOSIS — I255 Ischemic cardiomyopathy: Secondary | ICD-10-CM

## 2014-07-06 DIAGNOSIS — I447 Left bundle-branch block, unspecified: Secondary | ICD-10-CM

## 2014-07-06 DIAGNOSIS — I259 Chronic ischemic heart disease, unspecified: Secondary | ICD-10-CM

## 2014-07-06 DIAGNOSIS — R609 Edema, unspecified: Secondary | ICD-10-CM | POA: Diagnosis not present

## 2014-07-06 DIAGNOSIS — Z950 Presence of cardiac pacemaker: Secondary | ICD-10-CM

## 2014-07-06 DIAGNOSIS — I2510 Atherosclerotic heart disease of native coronary artery without angina pectoris: Secondary | ICD-10-CM

## 2014-07-06 DIAGNOSIS — I429 Cardiomyopathy, unspecified: Secondary | ICD-10-CM | POA: Diagnosis not present

## 2014-07-06 LAB — MDC_IDC_ENUM_SESS_TYPE_INCLINIC
Battery Remaining Longevity: 72 mo
Battery Voltage: 3.02 V
Brady Statistic AP VP Percent: 1.41 %
Brady Statistic AP VS Percent: 0 %
Brady Statistic AS VP Percent: 98.48 %
Brady Statistic AS VS Percent: 0.1 %
Brady Statistic RA Percent Paced: 1.42 %
Brady Statistic RV Percent Paced: 99.9 %
Date Time Interrogation Session: 20151203125056
Lead Channel Impedance Value: 323 Ohm
Lead Channel Impedance Value: 437 Ohm
Lead Channel Impedance Value: 475 Ohm
Lead Channel Impedance Value: 513 Ohm
Lead Channel Impedance Value: 513 Ohm
Lead Channel Impedance Value: 532 Ohm
Lead Channel Impedance Value: 646 Ohm
Lead Channel Impedance Value: 646 Ohm
Lead Channel Impedance Value: 722 Ohm
Lead Channel Pacing Threshold Amplitude: 0.625 V
Lead Channel Pacing Threshold Amplitude: 0.75 V
Lead Channel Pacing Threshold Amplitude: 1 V
Lead Channel Pacing Threshold Pulse Width: 0.4 ms
Lead Channel Pacing Threshold Pulse Width: 0.4 ms
Lead Channel Pacing Threshold Pulse Width: 0.4 ms
Lead Channel Sensing Intrinsic Amplitude: 2.375 mV
Lead Channel Sensing Intrinsic Amplitude: 6.375 mV
Lead Channel Setting Pacing Amplitude: 1.5 V
Lead Channel Setting Pacing Amplitude: 2 V
Lead Channel Setting Pacing Amplitude: 2.25 V
Lead Channel Setting Pacing Pulse Width: 0.4 ms
Lead Channel Setting Pacing Pulse Width: 0.4 ms
Lead Channel Setting Sensing Sensitivity: 2.8 mV
Zone Setting Detection Interval: 350 ms
Zone Setting Detection Interval: 400 ms

## 2014-07-06 MED ORDER — FUROSEMIDE 20 MG PO TABS: ORAL_TABLET | ORAL | Status: DC

## 2014-07-06 NOTE — Patient Instructions (Signed)
Your physician has recommended you make the following change in your medication:  1) Change how you take Furosemide (Lasix) -- take 40 mg every other day  Your physician wants you to follow-up in: 6 months with device clinic.  You will receive a reminder letter in the mail two months in advance. If you don't receive a letter, please call our office to schedule the follow-up appointment.  Your physician wants you to follow-up in: 1 year with Dr. Caryl Comes.  You will receive a reminder letter in the mail two months in advance. If you don't receive a letter, please call our office to schedule the follow-up appointment.

## 2014-07-06 NOTE — Progress Notes (Signed)
Patient Care Team: Gaynelle Arabian, MD as PCP - General (Family Medicine)   HPI  [name] is a 80 y.o. female Seen in followup for congestive heart failure in the setting of ischemic and ischemic heart disease status post CRT D. Implantation complicated by lead dislodgment X3 She thinks that she is functionally improved since the CRT implantation. Her edema while chronic is less.although it comes and goes. Her dyspnea is also better.  She has no major complaints except the illness of her husband. Interval echocardiogram 9/15 demonstrated normalization of LV systolic function with mild left atrial dilatation  Past Medical History  Diagnosis Date  . Macular degeneration   . Mild mitral regurgitation     Mild TR   . Osteopenia   . LVF (left ventricular failure)     Mild, w/ PASP 45mmHg  . Vitamin B12 deficiency   . Breast cancer     bilaterally  . Cardiomyopathy     Mixed CM; s/p St. Jude CRT-P implant 10/24 w/ LV lead revision 10/25   . LBBB (left bundle branch block)   . Chronic systolic CHF (congestive heart failure)   . Hypertension   . Pacemaker   . GERD (gastroesophageal reflux disease)     occ  . Umbilical hernia     Repaired on 01/10/2013   . Anginal pain     "q 6 months or so" (02/23/2013)  . Hematuria 02/23/2013    w/clots required hospital admission on 02/23/2013  . Exertional dyspnea     "before stent in 2010" (02/23/2013)  . Osteoarthritis     "right knee" (02/23/2013)  . CAD (coronary artery disease)     w/ DES to LAD with residual diagonal disease to small for PCI 6/11 and repeat DES to LAD for restenosis 3/12    Past Surgical History  Procedure Laterality Date  . Coronary angioplasty with stent placement  2010    "1" (05/27/2012)  . Total hip arthroplasty  2011    right  . Mastectomy  1995    bilaterally  . Breast biopsy  1995    bilaterally  . Cataract extraction w/ intraocular lens  implant, bilateral  1990's  . Joint replacement    . Tonsillectomy     . Umbilical hernia repair N/A 01/10/2013    Procedure: HERNIA REPAIR UMBILICAL ADULT;  Surgeon: Haywood Lasso, MD;  Location: [facility];  Service: General;  Laterality: N/A;  . Insertion of mesh N/A 01/10/2013    Procedure: INSERTION OF MESH;  Surgeon: Haywood Lasso, MD;  Location: [facility];  Service: General;  Laterality: N/A;  . Hernia repair    . Insert / replace / remove pacemaker  05/27/2012    initial placement - BiV PPM  . Insert / replace / remove pacemaker  05/31/2012    "lead change/repaired" (05/31/2012)    Current Outpatient Prescriptions  Medication Sig Dispense Refill  . alendronate (FOSAMAX) 70 MG tablet Take 70 mg by mouth every 7 (seven) days. Take in the morning with a full glass of water, on an empty stomach, and do not take anything else by mouth or lie down for the next 30 min. On Wednesday    . aspirin EC 81 MG tablet Take 81 mg by mouth daily.    . carvedilol (COREG) 3.125 MG tablet TAKE ONE TABLET TWICE DAILY 180 tablet 1  . Cholecalciferol (VITAMIN D-3) 5000 UNITS TABS Take 5,000 Units by mouth daily.     . clopidogrel (PLAVIX)  75 MG tablet TAKE ONE TABLET EACH DAY 30 tablet 6  . furosemide (LASIX) 20 MG tablet TAKE ONE TABLET EACH DAY 90 tablet 3  . isosorbide mononitrate (IMDUR) 60 MG 24 hr tablet TAKE ONE AND ONE-HALF TABLETS DAILY 135 tablet 3  . nitroGLYCERIN (NITROSTAT) 0.4 MG SL tablet Place 0.4 mg under the tongue every 5 (five) minutes as needed for chest pain.     . ramipril (ALTACE) 5 MG capsule Take 1 capsule (5 mg total) by mouth daily. 30 capsule 6  . simvastatin (ZOCOR) 20 MG tablet Take 1 tablet (20 mg total) by mouth daily at 6 PM. 90 tablet 3  . traMADol (ULTRAM) 50 MG tablet Take 100 mg by mouth See admin instructions. Take two tablets in the morning, and if needed, two more in the evening for pain.     No current facility-administered medications for this visit.    No Known Allergies  Review of Systems negative except from HPI and  PMH  Physical Exam BP 136/60 mmHg  Pulse 75  Ht 5\' 5"  (1.651 m)  Wt 176 lb (79.833 kg)  BMI 29.29 kg/m2 Well developed and well nourished in no acute distress HENT normal E scleral and icterus clear Neck Supple JVP flat; carotids brisk and full Clear to ausculation Device pocket well healed; without hematoma or erythema   Regular rate and rhythm, no murmurs gallops or rub Soft with active bowel sounds No clubbing cyanosis 2+ and non-pitting Edema Alert and oriented, grossly normal motor and sensory function Skin Warm and Dry  ecg  P-synchronous/ AV  pacing   Assessment and  Plan  Ischemic and nonischemic heart disease  Cardiomyopathy-resolved  CRT P  Chronic edema  Overall [name] is doing terrifically  We will continue her on her current medications although we will change her diuretics 20   every night to 40 mg taken 3-4 times a week so as to improve the sleep issues and improve the diuretic effect.

## 2014-07-13 ENCOUNTER — Encounter (HOSPITAL_COMMUNITY): Payer: Self-pay | Admitting: Internal Medicine

## 2014-07-24 DIAGNOSIS — E538 Deficiency of other specified B group vitamins: Secondary | ICD-10-CM | POA: Diagnosis not present

## 2014-08-07 DIAGNOSIS — M6281 Muscle weakness (generalized): Secondary | ICD-10-CM | POA: Diagnosis not present

## 2014-08-07 DIAGNOSIS — R2689 Other abnormalities of gait and mobility: Secondary | ICD-10-CM | POA: Diagnosis not present

## 2014-08-07 DIAGNOSIS — R270 Ataxia, unspecified: Secondary | ICD-10-CM | POA: Diagnosis not present

## 2014-08-07 DIAGNOSIS — M179 Osteoarthritis of knee, unspecified: Secondary | ICD-10-CM | POA: Diagnosis not present

## 2014-08-09 ENCOUNTER — Encounter: Payer: Self-pay | Admitting: Cardiology

## 2014-08-10 ENCOUNTER — Telehealth: Payer: Self-pay | Admitting: Cardiology

## 2014-08-10 DIAGNOSIS — R2689 Other abnormalities of gait and mobility: Secondary | ICD-10-CM | POA: Diagnosis not present

## 2014-08-10 DIAGNOSIS — R270 Ataxia, unspecified: Secondary | ICD-10-CM | POA: Diagnosis not present

## 2014-08-10 DIAGNOSIS — M179 Osteoarthritis of knee, unspecified: Secondary | ICD-10-CM | POA: Diagnosis not present

## 2014-08-10 DIAGNOSIS — M6281 Muscle weakness (generalized): Secondary | ICD-10-CM | POA: Diagnosis not present

## 2014-08-10 NOTE — Telephone Encounter (Signed)
Anderson Malta, PT, asking for parameters to work with patient.   To Dr. Radford Pax for review.

## 2014-08-10 NOTE — Telephone Encounter (Signed)
New problem    Today is pt's 1st therapy visit and her heartbeat was 52 beats a min and it was irregular w/palpations. BP was 100/58, 3-4 mins later heartrate was 86 beats a minute. Please advise of parameter that it should be.

## 2014-08-11 NOTE — Telephone Encounter (Signed)
Please have patient come in for a 30 day event monitor to make sure she is not having PAF

## 2014-08-11 NOTE — Telephone Encounter (Signed)
Patient agrees with Dr. Theodosia Blender recommendations for event monitor.

## 2014-08-14 DIAGNOSIS — R270 Ataxia, unspecified: Secondary | ICD-10-CM | POA: Diagnosis not present

## 2014-08-14 DIAGNOSIS — M179 Osteoarthritis of knee, unspecified: Secondary | ICD-10-CM | POA: Diagnosis not present

## 2014-08-14 DIAGNOSIS — R2689 Other abnormalities of gait and mobility: Secondary | ICD-10-CM | POA: Diagnosis not present

## 2014-08-14 DIAGNOSIS — M6281 Muscle weakness (generalized): Secondary | ICD-10-CM | POA: Diagnosis not present

## 2014-08-14 NOTE — Telephone Encounter (Signed)
Follow up       Can pt exercise in therapy because of her heart rate?  They need guidelines for exercise

## 2014-08-14 NOTE — Telephone Encounter (Signed)
Hold on monitor and get a device check

## 2014-08-14 NOTE — Telephone Encounter (Signed)
Left message to call back  

## 2014-08-15 NOTE — Telephone Encounter (Signed)
Device check

## 2014-08-15 NOTE — Telephone Encounter (Signed)
Device check scheduled for next Wednesday at 1030.  Anderson Malta, PT is requesting VS parameters from Dr. Radford Pax so she may work with the patient. To Dr. Radford Pax for review.

## 2014-08-16 NOTE — Telephone Encounter (Signed)
Per Dr. Radford Pax, informed patient it is OK to continue PT unless HR > 120. Also left message on PT's phone to call the office if she has any more concerns after talking to the patient.

## 2014-08-23 ENCOUNTER — Ambulatory Visit (INDEPENDENT_AMBULATORY_CARE_PROVIDER_SITE_OTHER): Payer: Medicare Other | Admitting: *Deleted

## 2014-08-23 DIAGNOSIS — I429 Cardiomyopathy, unspecified: Secondary | ICD-10-CM | POA: Diagnosis not present

## 2014-08-23 DIAGNOSIS — I5022 Chronic systolic (congestive) heart failure: Secondary | ICD-10-CM

## 2014-08-23 DIAGNOSIS — I447 Left bundle-branch block, unspecified: Secondary | ICD-10-CM

## 2014-08-23 LAB — MDC_IDC_ENUM_SESS_TYPE_INCLINIC
Battery Remaining Longevity: 72 mo
Battery Voltage: 3.02 V
Brady Statistic AP VP Percent: 9.87 %
Brady Statistic AP VS Percent: 0 %
Brady Statistic AS VP Percent: 90.08 %
Brady Statistic AS VS Percent: 0.05 %
Brady Statistic RA Percent Paced: 9.88 %
Brady Statistic RV Percent Paced: 99.95 %
Date Time Interrogation Session: 20160120111356
Lead Channel Impedance Value: 304 Ohm
Lead Channel Impedance Value: 418 Ohm
Lead Channel Impedance Value: 456 Ohm
Lead Channel Impedance Value: 494 Ohm
Lead Channel Impedance Value: 513 Ohm
Lead Channel Impedance Value: 532 Ohm
Lead Channel Impedance Value: 627 Ohm
Lead Channel Impedance Value: 646 Ohm
Lead Channel Impedance Value: 722 Ohm
Lead Channel Pacing Threshold Amplitude: 0.625 V
Lead Channel Pacing Threshold Amplitude: 0.75 V
Lead Channel Pacing Threshold Amplitude: 1 V
Lead Channel Pacing Threshold Pulse Width: 0.4 ms
Lead Channel Pacing Threshold Pulse Width: 0.4 ms
Lead Channel Pacing Threshold Pulse Width: 0.4 ms
Lead Channel Sensing Intrinsic Amplitude: 2 mV
Lead Channel Sensing Intrinsic Amplitude: 2.375 mV
Lead Channel Sensing Intrinsic Amplitude: 5.875 mV
Lead Channel Sensing Intrinsic Amplitude: 6.375 mV
Lead Channel Setting Pacing Amplitude: 2 V
Lead Channel Setting Pacing Amplitude: 2 V
Lead Channel Setting Pacing Amplitude: 2.5 V
Lead Channel Setting Pacing Pulse Width: 0.4 ms
Lead Channel Setting Pacing Pulse Width: 0.4 ms
Lead Channel Setting Sensing Sensitivity: 2.8 mV
Zone Setting Detection Interval: 350 ms
Zone Setting Detection Interval: 400 ms

## 2014-08-23 NOTE — Progress Notes (Signed)
CRT-P device check in clinic. Normal device function. Thresholds, sensing, impedance consistent with previous measurements. Histograms appropriate for patient and level of activity. No mode switches or ventricular high rate episodes. Patient bi-ventricularly pacing 94.2% of the time. 20 VS episodes---max dur. 6 sec, Max A 136, Max V 133---SVT. Device programmed with appropriate safety margins. Device heart failure diagnostics are within normal limits at present---last abn Nov. Estimated longevity 6 years. Patient will follow up with the [facility] on 01-22-2015 and with [facility] in 07-2015.

## 2014-08-30 DIAGNOSIS — R05 Cough: Secondary | ICD-10-CM | POA: Diagnosis not present

## 2014-08-30 DIAGNOSIS — R609 Edema, unspecified: Secondary | ICD-10-CM | POA: Diagnosis not present

## 2014-08-30 DIAGNOSIS — Z1389 Encounter for screening for other disorder: Secondary | ICD-10-CM | POA: Diagnosis not present

## 2014-08-30 DIAGNOSIS — E538 Deficiency of other specified B group vitamins: Secondary | ICD-10-CM | POA: Diagnosis not present

## 2014-08-30 DIAGNOSIS — I8310 Varicose veins of unspecified lower extremity with inflammation: Secondary | ICD-10-CM | POA: Diagnosis not present

## 2014-08-30 DIAGNOSIS — R21 Rash and other nonspecific skin eruption: Secondary | ICD-10-CM | POA: Diagnosis not present

## 2014-09-04 ENCOUNTER — Encounter: Payer: Self-pay | Admitting: Internal Medicine

## 2014-09-05 ENCOUNTER — Telehealth: Payer: Self-pay | Admitting: Internal Medicine

## 2014-09-05 NOTE — Telephone Encounter (Signed)
New message     Based on most recent pacemaker interrogation----can pt continue with physical therapy?

## 2014-09-06 DIAGNOSIS — M6281 Muscle weakness (generalized): Secondary | ICD-10-CM | POA: Diagnosis not present

## 2014-09-06 DIAGNOSIS — R2689 Other abnormalities of gait and mobility: Secondary | ICD-10-CM | POA: Diagnosis not present

## 2014-09-06 DIAGNOSIS — R278 Other lack of coordination: Secondary | ICD-10-CM | POA: Diagnosis not present

## 2014-09-06 DIAGNOSIS — M179 Osteoarthritis of knee, unspecified: Secondary | ICD-10-CM | POA: Diagnosis not present

## 2014-09-06 MED ORDER — CARVEDILOL 6.25 MG PO TABS: 6.2500 mg | ORAL_TABLET | Freq: Two times a day (BID) | ORAL | Status: DC

## 2014-09-06 NOTE — Telephone Encounter (Signed)
Patient approved to continue PT.  Informed [facility] with [facility]. Also called patient and informed her of increasing Carvedilol to 6.25 mg twice daily.  She has plenty of 3.125 mg tablets and double up on these before filling rx for new dosage amount. Patient verbalized understanding and agreeable to plan.

## 2014-09-08 DIAGNOSIS — R278 Other lack of coordination: Secondary | ICD-10-CM | POA: Diagnosis not present

## 2014-09-08 DIAGNOSIS — M6281 Muscle weakness (generalized): Secondary | ICD-10-CM | POA: Diagnosis not present

## 2014-09-08 DIAGNOSIS — R2689 Other abnormalities of gait and mobility: Secondary | ICD-10-CM | POA: Diagnosis not present

## 2014-09-08 DIAGNOSIS — M179 Osteoarthritis of knee, unspecified: Secondary | ICD-10-CM | POA: Diagnosis not present

## 2014-09-12 DIAGNOSIS — M179 Osteoarthritis of knee, unspecified: Secondary | ICD-10-CM | POA: Diagnosis not present

## 2014-09-12 DIAGNOSIS — R278 Other lack of coordination: Secondary | ICD-10-CM | POA: Diagnosis not present

## 2014-09-12 DIAGNOSIS — M6281 Muscle weakness (generalized): Secondary | ICD-10-CM | POA: Diagnosis not present

## 2014-09-12 DIAGNOSIS — R2689 Other abnormalities of gait and mobility: Secondary | ICD-10-CM | POA: Diagnosis not present

## 2014-09-14 DIAGNOSIS — R2689 Other abnormalities of gait and mobility: Secondary | ICD-10-CM | POA: Diagnosis not present

## 2014-09-14 DIAGNOSIS — M6281 Muscle weakness (generalized): Secondary | ICD-10-CM | POA: Diagnosis not present

## 2014-09-14 DIAGNOSIS — M179 Osteoarthritis of knee, unspecified: Secondary | ICD-10-CM | POA: Diagnosis not present

## 2014-09-14 DIAGNOSIS — R278 Other lack of coordination: Secondary | ICD-10-CM | POA: Diagnosis not present

## 2014-09-19 DIAGNOSIS — M6281 Muscle weakness (generalized): Secondary | ICD-10-CM | POA: Diagnosis not present

## 2014-09-19 DIAGNOSIS — M179 Osteoarthritis of knee, unspecified: Secondary | ICD-10-CM | POA: Diagnosis not present

## 2014-09-19 DIAGNOSIS — R2689 Other abnormalities of gait and mobility: Secondary | ICD-10-CM | POA: Diagnosis not present

## 2014-09-19 DIAGNOSIS — R278 Other lack of coordination: Secondary | ICD-10-CM | POA: Diagnosis not present

## 2014-09-21 DIAGNOSIS — R2689 Other abnormalities of gait and mobility: Secondary | ICD-10-CM | POA: Diagnosis not present

## 2014-09-21 DIAGNOSIS — M6281 Muscle weakness (generalized): Secondary | ICD-10-CM | POA: Diagnosis not present

## 2014-09-21 DIAGNOSIS — M179 Osteoarthritis of knee, unspecified: Secondary | ICD-10-CM | POA: Diagnosis not present

## 2014-09-21 DIAGNOSIS — R278 Other lack of coordination: Secondary | ICD-10-CM | POA: Diagnosis not present

## 2014-09-25 DIAGNOSIS — M6281 Muscle weakness (generalized): Secondary | ICD-10-CM | POA: Diagnosis not present

## 2014-09-25 DIAGNOSIS — R278 Other lack of coordination: Secondary | ICD-10-CM | POA: Diagnosis not present

## 2014-09-25 DIAGNOSIS — R2689 Other abnormalities of gait and mobility: Secondary | ICD-10-CM | POA: Diagnosis not present

## 2014-09-25 DIAGNOSIS — M179 Osteoarthritis of knee, unspecified: Secondary | ICD-10-CM | POA: Diagnosis not present

## 2014-09-27 DIAGNOSIS — R278 Other lack of coordination: Secondary | ICD-10-CM | POA: Diagnosis not present

## 2014-09-27 DIAGNOSIS — M179 Osteoarthritis of knee, unspecified: Secondary | ICD-10-CM | POA: Diagnosis not present

## 2014-09-27 DIAGNOSIS — R2689 Other abnormalities of gait and mobility: Secondary | ICD-10-CM | POA: Diagnosis not present

## 2014-09-27 DIAGNOSIS — M6281 Muscle weakness (generalized): Secondary | ICD-10-CM | POA: Diagnosis not present

## 2014-10-02 ENCOUNTER — Other Ambulatory Visit: Payer: Self-pay | Admitting: Cardiology

## 2014-10-03 DIAGNOSIS — R278 Other lack of coordination: Secondary | ICD-10-CM | POA: Diagnosis not present

## 2014-10-03 DIAGNOSIS — R2689 Other abnormalities of gait and mobility: Secondary | ICD-10-CM | POA: Diagnosis not present

## 2014-10-03 DIAGNOSIS — M6281 Muscle weakness (generalized): Secondary | ICD-10-CM | POA: Diagnosis not present

## 2014-10-03 DIAGNOSIS — M179 Osteoarthritis of knee, unspecified: Secondary | ICD-10-CM | POA: Diagnosis not present

## 2014-10-06 DIAGNOSIS — R278 Other lack of coordination: Secondary | ICD-10-CM | POA: Diagnosis not present

## 2014-10-06 DIAGNOSIS — R2689 Other abnormalities of gait and mobility: Secondary | ICD-10-CM | POA: Diagnosis not present

## 2014-10-06 DIAGNOSIS — M179 Osteoarthritis of knee, unspecified: Secondary | ICD-10-CM | POA: Diagnosis not present

## 2014-10-06 DIAGNOSIS — M6281 Muscle weakness (generalized): Secondary | ICD-10-CM | POA: Diagnosis not present

## 2014-10-10 DIAGNOSIS — M6281 Muscle weakness (generalized): Secondary | ICD-10-CM | POA: Diagnosis not present

## 2014-10-10 DIAGNOSIS — R2689 Other abnormalities of gait and mobility: Secondary | ICD-10-CM | POA: Diagnosis not present

## 2014-10-10 DIAGNOSIS — M179 Osteoarthritis of knee, unspecified: Secondary | ICD-10-CM | POA: Diagnosis not present

## 2014-10-10 DIAGNOSIS — R278 Other lack of coordination: Secondary | ICD-10-CM | POA: Diagnosis not present

## 2014-10-13 ENCOUNTER — Telehealth: Payer: Self-pay | Admitting: Cardiology

## 2014-10-13 DIAGNOSIS — M179 Osteoarthritis of knee, unspecified: Secondary | ICD-10-CM | POA: Diagnosis not present

## 2014-10-13 DIAGNOSIS — M6281 Muscle weakness (generalized): Secondary | ICD-10-CM | POA: Diagnosis not present

## 2014-10-13 DIAGNOSIS — R2689 Other abnormalities of gait and mobility: Secondary | ICD-10-CM | POA: Diagnosis not present

## 2014-10-13 DIAGNOSIS — R278 Other lack of coordination: Secondary | ICD-10-CM | POA: Diagnosis not present

## 2014-10-13 NOTE — Telephone Encounter (Signed)
F/U        Jerolyn Shin calling, Please contact at [phone]  Pt BP this AM was 134/70 HR 76 bpm,   After light exercise pt HR decreased to 44 bpm and Bp 114/60, this was around at 1:40 pm   Vitals  Rechecked at 3:45 pm BP 110/58 HR 76 bpm, non symptomatic throughout entire ordeal.  PT feels this is not normal at all.   Does Dr. Caryl Comes have any specific parameters for therapy?

## 2014-10-13 NOTE — Telephone Encounter (Signed)
New Message  PT- Jerolyn Shin called wanting to speak w/ Rn about Pt's low HEart rate- 44. Please call back and discuss.

## 2014-10-13 NOTE — Telephone Encounter (Signed)
Advised ok at this point with patient being asymptomatic.  She manually checked pulse which was irregular at 44bpm.  Explained that count/irregularity probably related to PVCs. Patient's PPM base is 50. Informed to call us if patient becomes symptomatic and we will re-evaluate need to be seen or sending in transmission to check. Anderson Malta with PT agrees with plan.

## 2014-10-17 DIAGNOSIS — R2689 Other abnormalities of gait and mobility: Secondary | ICD-10-CM | POA: Diagnosis not present

## 2014-10-17 DIAGNOSIS — M6281 Muscle weakness (generalized): Secondary | ICD-10-CM | POA: Diagnosis not present

## 2014-10-17 DIAGNOSIS — M179 Osteoarthritis of knee, unspecified: Secondary | ICD-10-CM | POA: Diagnosis not present

## 2014-10-17 DIAGNOSIS — R278 Other lack of coordination: Secondary | ICD-10-CM | POA: Diagnosis not present

## 2014-10-20 DIAGNOSIS — M6281 Muscle weakness (generalized): Secondary | ICD-10-CM | POA: Diagnosis not present

## 2014-10-20 DIAGNOSIS — R2689 Other abnormalities of gait and mobility: Secondary | ICD-10-CM | POA: Diagnosis not present

## 2014-10-20 DIAGNOSIS — R278 Other lack of coordination: Secondary | ICD-10-CM | POA: Diagnosis not present

## 2014-10-20 DIAGNOSIS — M179 Osteoarthritis of knee, unspecified: Secondary | ICD-10-CM | POA: Diagnosis not present

## 2014-10-24 DIAGNOSIS — M6281 Muscle weakness (generalized): Secondary | ICD-10-CM | POA: Diagnosis not present

## 2014-10-24 DIAGNOSIS — M179 Osteoarthritis of knee, unspecified: Secondary | ICD-10-CM | POA: Diagnosis not present

## 2014-10-24 DIAGNOSIS — R2689 Other abnormalities of gait and mobility: Secondary | ICD-10-CM | POA: Diagnosis not present

## 2014-10-24 DIAGNOSIS — R278 Other lack of coordination: Secondary | ICD-10-CM | POA: Diagnosis not present

## 2014-10-26 DIAGNOSIS — H31011 Macula scars of posterior pole (postinflammatory) (post-traumatic), right eye: Secondary | ICD-10-CM | POA: Diagnosis not present

## 2014-10-26 DIAGNOSIS — M179 Osteoarthritis of knee, unspecified: Secondary | ICD-10-CM | POA: Diagnosis not present

## 2014-10-26 DIAGNOSIS — R278 Other lack of coordination: Secondary | ICD-10-CM | POA: Diagnosis not present

## 2014-10-26 DIAGNOSIS — H3531 Nonexudative age-related macular degeneration: Secondary | ICD-10-CM | POA: Diagnosis not present

## 2014-10-26 DIAGNOSIS — R2689 Other abnormalities of gait and mobility: Secondary | ICD-10-CM | POA: Diagnosis not present

## 2014-10-26 DIAGNOSIS — H3532 Exudative age-related macular degeneration: Secondary | ICD-10-CM | POA: Diagnosis not present

## 2014-10-26 DIAGNOSIS — M6281 Muscle weakness (generalized): Secondary | ICD-10-CM | POA: Diagnosis not present

## 2014-10-26 DIAGNOSIS — Z961 Presence of intraocular lens: Secondary | ICD-10-CM | POA: Diagnosis not present

## 2014-11-07 DIAGNOSIS — R278 Other lack of coordination: Secondary | ICD-10-CM | POA: Diagnosis not present

## 2014-11-07 DIAGNOSIS — M6281 Muscle weakness (generalized): Secondary | ICD-10-CM | POA: Diagnosis not present

## 2014-11-07 DIAGNOSIS — M179 Osteoarthritis of knee, unspecified: Secondary | ICD-10-CM | POA: Diagnosis not present

## 2014-11-07 DIAGNOSIS — R2689 Other abnormalities of gait and mobility: Secondary | ICD-10-CM | POA: Diagnosis not present

## 2014-11-07 NOTE — Progress Notes (Signed)
Cardiology Office Note   Date:  11/08/2014   ID:  MYREL RAPPLEYE, DOB [birth year], MRN [id number]  PCP:  Simona Huh, MD    Chief Complaint  Patient presents with  . Coronary Artery Disease  . Hypertension  . Hyperlipidemia  . Congestive Heart Failure      History of Present Illness: [name] is a 81 y.o. female with a history of ASCAD, chronic stable angina, ischemic DCM, LBBB, BiVPPM, HTN, dyslipidemia and chronic systolic CHF who presents today for followup. She is doing well. She denies any SOB, DOE, dizziness, palpitations or syncope. She has chronic LE edema which she thinks may be worse. She has chronic stable angina that has not changed and has only had to take SL NTG once monthly.     Past Medical History  Diagnosis Date  . Macular degeneration   . Mild mitral regurgitation     Mild TR   . Osteopenia   . LVF (left ventricular failure)     Mild, w/ PASP 38mmHg  . Vitamin B12 deficiency   . Breast cancer     bilaterally  . Cardiomyopathy     Mixed CM; s/p St. Jude CRT-P implant 10/24 w/ LV lead revision 10/25   . LBBB (left bundle branch block)   . Chronic systolic CHF (congestive heart failure)   . Hypertension   . Pacemaker   . GERD (gastroesophageal reflux disease)     occ  . Umbilical hernia     Repaired on 01/10/2013   . Anginal pain     "q 6 months or so" (02/23/2013)  . Hematuria 02/23/2013    w/clots required hospital admission on 02/23/2013  . Exertional dyspnea     "before stent in 2010" (02/23/2013)  . Osteoarthritis     "right knee" (02/23/2013)  . CAD (coronary artery disease)     w/ DES to LAD with residual diagonal disease to small for PCI 6/11 and repeat DES to LAD for restenosis 3/12    Past Surgical History  Procedure Laterality Date  . Coronary angioplasty with stent placement  2010    "1" (05/27/2012)  . Total hip arthroplasty  2011    right  . Mastectomy  1995    bilaterally  . Breast biopsy  1995    bilaterally  .  Cataract extraction w/ intraocular lens  implant, bilateral  1990's  . Joint replacement    . Tonsillectomy    . Umbilical hernia repair N/A 01/10/2013    Procedure: HERNIA REPAIR UMBILICAL ADULT;  Surgeon: Haywood Lasso, MD;  Location: [place];  Service: General;  Laterality: N/A;  . Insertion of mesh N/A 01/10/2013    Procedure: INSERTION OF MESH;  Surgeon: Haywood Lasso, MD;  Location: [place];  Service: General;  Laterality: N/A;  . Hernia repair    . Insert / replace / remove pacemaker  05/27/2012    initial placement - BiV PPM  . Insert / replace / remove pacemaker  05/31/2012    "lead change/repaired" (05/31/2012)  . Bi-ventricular pacemaker insertion N/A 05/27/2012    Procedure: BI-VENTRICULAR PACEMAKER INSERTION (CRT-P);  Surgeon: Deboraha Sprang, MD;  Location: [facility] C[facility] LAB;  Service: Cardiovascular;  Laterality: N/A;  . Lead revision N/A 05/28/2012    Procedure: LEAD REVISION;  Surgeon: Evans Lance, MD;  Location: [facility] CATH LAB;  Service: Cardiovascular;  Laterality: N/A;  . Lead revision Left 05/31/2012    Procedure: LEAD REVISION;  Surgeon: Remo Lipps  Peterson Lombard, MD;  Location: [facility] CATH LAB;  Service: Cardiovascular;  Laterality: Left;     Current Outpatient Prescriptions  Medication Sig Dispense Refill  . aspirin EC 81 MG tablet Take 81 mg by mouth daily.    . carvedilol (COREG) 6.25 MG tablet Take 1 tablet (6.25 mg total) by mouth 2 (two) times daily. (Patient taking differently: Take 6.25 mg by mouth 2 (two) times daily. 2 TABLETS BY MOUTH TWICE DAILY) 60 tablet 6  . Cholecalciferol (VITAMIN D-3) 5000 UNITS TABS Take 5,000 Units by mouth daily.     . clopidogrel (PLAVIX) 75 MG tablet TAKE ONE TABLET EVERY DAY 30 tablet 1  . isosorbide mononitrate (IMDUR) 60 MG 24 hr tablet TAKE ONE AND ONE-HALF TABLETS DAILY 135 tablet 3  . nitroGLYCERIN (NITROSTAT) 0.4 MG SL tablet Place 0.4 mg under the tongue every 5 (five) minutes as needed for chest pain.     . ramipril (ALTACE) 5 MG  capsule Take 1 capsule (5 mg total) by mouth daily. 30 capsule 6  . simvastatin (ZOCOR) 20 MG tablet Take 1 tablet (20 mg total) by mouth daily at 6 PM. 90 tablet 3  . traMADol (ULTRAM) 50 MG tablet Take 100 mg by mouth See admin instructions. Take two tablets in the morning, and if needed, two more in the evening for pain.    . furosemide (LASIX) 40 MG tablet Take 40mg  every other day alternating with 20mg  every other day 90 tablet 3   No current facility-administered medications for this visit.    Allergies:   Review of patient's allergies indicates no known allergies.    Social History:  The patient  reports that she quit smoking about 51 years ago. Her smoking use included Cigarettes. She has a 27 pack-year smoking history. She has never used smokeless tobacco. She reports that she drinks about 3.0 oz of alcohol per week. She reports that she does not use illicit drugs.   Family History:  The patient's family history includes Heart disease in her father and mother.    ROS:  Please see the history of present illness.   Otherwise, review of systems are positive for none.   All other systems are reviewed and negative.    PHYSICAL EXAM: VS:  BP 128/70 mmHg  Pulse 79  Ht 5\' 5"  (1.651 m)  Wt 179 lb 9.6 oz (81.466 kg)  BMI 29.89 kg/m2  SpO2 97% , BMI Body mass index is 29.89 kg/(m^2). GEN: Well nourished, well developed, in no acute distress HEENT: normal Neck: no JVD, carotid bruits, or masses Cardiac: RRR; no murmurs, rubs, or gallops.  1-2+ edema LEs Respiratory:  clear to auscultation bilaterally, normal work of breathing GI: soft, nontender, nondistended, + BS MS: no deformity or atrophy Skin: warm and dry, no rash Neuro:  Strength and sensation are intact Psych: euthymic mood, full affect   EKG:  EKG is not ordered today.    Recent Labs: 04/26/2014: BUN 29*; Creatinine 1.1; Potassium 4.0; Pro B Natriuretic peptide (BNP) 120.0*; Sodium 140 06/16/2014: ALT 15    Lipid  Panel    Component Value Date/Time   CHOL 117 06/16/2014 1005   TRIG 74.0 06/16/2014 1005   HDL 43.00 06/16/2014 1005   CHOLHDL 3 06/16/2014 1005   VLDL 14.8 06/16/2014 1005   LDLCALC 59 06/16/2014 1005      Wt Readings from Last 3 Encounters:  11/08/14 179 lb 9.6 oz (81.466 kg)  07/06/14 176 lb (79.833 kg)  04/19/14 180  lb (81.647 kg)       ASSESSMENT AND PLAN:  1. ASCAD with no anginal CP  - continue ASA/Imdur /Plavix  2. HTN - controlled - continue Ramipril/carvedilol  3. Dyslipidemia - LDL at goal - continue simvastatin   4. SOB - she attributes this to having to take care of her husband since he fell       5.  Chronic systolic CHF - appears well compensated on exam except for an increase in LE edema.  -continue coreg/ramipril - increase Lasix to 40mg  qod alternating with 20mg  qod - check BMET  6. Ischemic/nonischemic DCM EF 35-40% by cath 2012 and 55-60% by echo 04/2014  Current medicines are reviewed at length with the patient today.  The patient does not have concerns regarding medicines.  The following changes have been made:  Increase lasix to 40mg  qod alternating with 20mg  qod  Labs/ tests ordered today include: see above assessment and plan  Orders Placed This Encounter  Procedures  . Basic Metabolic Panel (BMET)     Disposition:   FU with me in 6 months   Signed, Sueanne Margarita, MD  11/08/2014 11:41 AM    [facility] [address], [place], [place]  [address] Phone: [phone]; Fax: [phone]

## 2014-11-08 ENCOUNTER — Ambulatory Visit (INDEPENDENT_AMBULATORY_CARE_PROVIDER_SITE_OTHER): Payer: Medicare Other | Admitting: Cardiology

## 2014-11-08 ENCOUNTER — Encounter: Payer: Self-pay | Admitting: Cardiology

## 2014-11-08 VITALS — BP 128/70 | HR 79 | Ht 65.0 in | Wt 179.6 lb

## 2014-11-08 DIAGNOSIS — I429 Cardiomyopathy, unspecified: Secondary | ICD-10-CM | POA: Diagnosis not present

## 2014-11-08 DIAGNOSIS — R609 Edema, unspecified: Secondary | ICD-10-CM

## 2014-11-08 DIAGNOSIS — I10 Essential (primary) hypertension: Secondary | ICD-10-CM | POA: Diagnosis not present

## 2014-11-08 DIAGNOSIS — I2510 Atherosclerotic heart disease of native coronary artery without angina pectoris: Secondary | ICD-10-CM | POA: Diagnosis not present

## 2014-11-08 DIAGNOSIS — I5022 Chronic systolic (congestive) heart failure: Secondary | ICD-10-CM

## 2014-11-08 DIAGNOSIS — I2583 Coronary atherosclerosis due to lipid rich plaque: Principal | ICD-10-CM

## 2014-11-08 LAB — BASIC METABOLIC PANEL
BUN: 25 mg/dL — ABNORMAL HIGH (ref 6–23)
CO2: 31 mEq/L (ref 19–32)
Calcium: 9.4 mg/dL (ref 8.4–10.5)
Chloride: 102 mEq/L (ref 96–112)
Creatinine, Ser: 1.13 mg/dL (ref 0.40–1.20)
GFR: 48.12 mL/min — ABNORMAL LOW (ref >60.00)
Glucose, Bld: 98 mg/dL (ref 70–99)
Potassium: 4 mEq/L (ref 3.5–5.1)
Sodium: 140 mEq/L (ref 135–145)

## 2014-11-08 MED ORDER — FUROSEMIDE 40 MG PO TABS: ORAL_TABLET | ORAL | Status: DC

## 2014-11-08 NOTE — Patient Instructions (Signed)
Your physician has recommended you make the following change in your medication:  1) INCREASE Lasix to 40mg  every other day, alternating with Lasix 20mg  every other day.  Your physician recommends that you have lab work today (bmet)  Your physician wants you to follow-up in: 6 months with Dr. Radford Pax. You will receive a reminder letter in the mail two months in advance. If you don't receive a letter, please call our office to schedule the follow-up appointment.

## 2014-11-09 ENCOUNTER — Other Ambulatory Visit: Payer: Self-pay | Admitting: Cardiology

## 2014-11-09 DIAGNOSIS — M179 Osteoarthritis of knee, unspecified: Secondary | ICD-10-CM | POA: Diagnosis not present

## 2014-11-09 DIAGNOSIS — R2689 Other abnormalities of gait and mobility: Secondary | ICD-10-CM | POA: Diagnosis not present

## 2014-11-09 DIAGNOSIS — R278 Other lack of coordination: Secondary | ICD-10-CM | POA: Diagnosis not present

## 2014-11-09 DIAGNOSIS — M6281 Muscle weakness (generalized): Secondary | ICD-10-CM | POA: Diagnosis not present

## 2014-11-09 NOTE — Telephone Encounter (Signed)
Approved      Disp Refills Start End    carvedilol (COREG) 6.25 MG tablet 60 tablet 6 09/06/2014     Sig - Route:  Take 1 tablet (6.25 mg total) by mouth 2 (two) times daily. - Oral    Class:  No Print    DAW:  No    Authorizing Provider:  Deboraha Sprang, MD    Ordering User:  Stanton Kidney, RN

## 2014-11-10 ENCOUNTER — Other Ambulatory Visit: Payer: Self-pay | Admitting: *Deleted

## 2014-11-10 MED ORDER — CARVEDILOL 6.25 MG PO TABS: 6.2500 mg | ORAL_TABLET | Freq: Two times a day (BID) | ORAL | Status: DC

## 2014-11-13 DIAGNOSIS — M179 Osteoarthritis of knee, unspecified: Secondary | ICD-10-CM | POA: Diagnosis not present

## 2014-11-13 DIAGNOSIS — M6281 Muscle weakness (generalized): Secondary | ICD-10-CM | POA: Diagnosis not present

## 2014-11-13 DIAGNOSIS — R2689 Other abnormalities of gait and mobility: Secondary | ICD-10-CM | POA: Diagnosis not present

## 2014-11-13 DIAGNOSIS — R278 Other lack of coordination: Secondary | ICD-10-CM | POA: Diagnosis not present

## 2014-11-22 DIAGNOSIS — R278 Other lack of coordination: Secondary | ICD-10-CM | POA: Diagnosis not present

## 2014-11-22 DIAGNOSIS — M6281 Muscle weakness (generalized): Secondary | ICD-10-CM | POA: Diagnosis not present

## 2014-11-22 DIAGNOSIS — M179 Osteoarthritis of knee, unspecified: Secondary | ICD-10-CM | POA: Diagnosis not present

## 2014-11-22 DIAGNOSIS — R2689 Other abnormalities of gait and mobility: Secondary | ICD-10-CM | POA: Diagnosis not present

## 2014-11-23 DIAGNOSIS — F39 Unspecified mood [affective] disorder: Secondary | ICD-10-CM | POA: Diagnosis not present

## 2014-11-23 DIAGNOSIS — F322 Major depressive disorder, single episode, severe without psychotic features: Secondary | ICD-10-CM | POA: Diagnosis not present

## 2014-11-23 DIAGNOSIS — S80821A Blister (nonthermal), right lower leg, initial encounter: Secondary | ICD-10-CM | POA: Diagnosis not present

## 2014-11-24 DIAGNOSIS — M6281 Muscle weakness (generalized): Secondary | ICD-10-CM | POA: Diagnosis not present

## 2014-11-24 DIAGNOSIS — R2689 Other abnormalities of gait and mobility: Secondary | ICD-10-CM | POA: Diagnosis not present

## 2014-11-24 DIAGNOSIS — M179 Osteoarthritis of knee, unspecified: Secondary | ICD-10-CM | POA: Diagnosis not present

## 2014-11-24 DIAGNOSIS — R278 Other lack of coordination: Secondary | ICD-10-CM | POA: Diagnosis not present

## 2014-11-29 DIAGNOSIS — M6281 Muscle weakness (generalized): Secondary | ICD-10-CM | POA: Diagnosis not present

## 2014-11-29 DIAGNOSIS — R278 Other lack of coordination: Secondary | ICD-10-CM | POA: Diagnosis not present

## 2014-11-29 DIAGNOSIS — M179 Osteoarthritis of knee, unspecified: Secondary | ICD-10-CM | POA: Diagnosis not present

## 2014-11-29 DIAGNOSIS — R2689 Other abnormalities of gait and mobility: Secondary | ICD-10-CM | POA: Diagnosis not present

## 2014-12-01 DIAGNOSIS — M179 Osteoarthritis of knee, unspecified: Secondary | ICD-10-CM | POA: Diagnosis not present

## 2014-12-01 DIAGNOSIS — R278 Other lack of coordination: Secondary | ICD-10-CM | POA: Diagnosis not present

## 2014-12-01 DIAGNOSIS — M6281 Muscle weakness (generalized): Secondary | ICD-10-CM | POA: Diagnosis not present

## 2014-12-01 DIAGNOSIS — R2689 Other abnormalities of gait and mobility: Secondary | ICD-10-CM | POA: Diagnosis not present

## 2014-12-06 ENCOUNTER — Other Ambulatory Visit: Payer: Self-pay | Admitting: Cardiology

## 2014-12-06 DIAGNOSIS — R278 Other lack of coordination: Secondary | ICD-10-CM | POA: Diagnosis not present

## 2014-12-06 DIAGNOSIS — R2689 Other abnormalities of gait and mobility: Secondary | ICD-10-CM | POA: Diagnosis not present

## 2014-12-06 DIAGNOSIS — M6281 Muscle weakness (generalized): Secondary | ICD-10-CM | POA: Diagnosis not present

## 2014-12-06 DIAGNOSIS — M179 Osteoarthritis of knee, unspecified: Secondary | ICD-10-CM | POA: Diagnosis not present

## 2014-12-08 DIAGNOSIS — M179 Osteoarthritis of knee, unspecified: Secondary | ICD-10-CM | POA: Diagnosis not present

## 2014-12-08 DIAGNOSIS — R278 Other lack of coordination: Secondary | ICD-10-CM | POA: Diagnosis not present

## 2014-12-08 DIAGNOSIS — M6281 Muscle weakness (generalized): Secondary | ICD-10-CM | POA: Diagnosis not present

## 2014-12-08 DIAGNOSIS — R2689 Other abnormalities of gait and mobility: Secondary | ICD-10-CM | POA: Diagnosis not present

## 2014-12-12 DIAGNOSIS — M179 Osteoarthritis of knee, unspecified: Secondary | ICD-10-CM | POA: Diagnosis not present

## 2014-12-12 DIAGNOSIS — R2689 Other abnormalities of gait and mobility: Secondary | ICD-10-CM | POA: Diagnosis not present

## 2014-12-12 DIAGNOSIS — M6281 Muscle weakness (generalized): Secondary | ICD-10-CM | POA: Diagnosis not present

## 2014-12-12 DIAGNOSIS — R278 Other lack of coordination: Secondary | ICD-10-CM | POA: Diagnosis not present

## 2014-12-15 DIAGNOSIS — R2689 Other abnormalities of gait and mobility: Secondary | ICD-10-CM | POA: Diagnosis not present

## 2014-12-15 DIAGNOSIS — R278 Other lack of coordination: Secondary | ICD-10-CM | POA: Diagnosis not present

## 2014-12-15 DIAGNOSIS — M179 Osteoarthritis of knee, unspecified: Secondary | ICD-10-CM | POA: Diagnosis not present

## 2014-12-15 DIAGNOSIS — M6281 Muscle weakness (generalized): Secondary | ICD-10-CM | POA: Diagnosis not present

## 2014-12-20 DIAGNOSIS — R2689 Other abnormalities of gait and mobility: Secondary | ICD-10-CM | POA: Diagnosis not present

## 2014-12-20 DIAGNOSIS — M6281 Muscle weakness (generalized): Secondary | ICD-10-CM | POA: Diagnosis not present

## 2014-12-20 DIAGNOSIS — M179 Osteoarthritis of knee, unspecified: Secondary | ICD-10-CM | POA: Diagnosis not present

## 2014-12-20 DIAGNOSIS — R278 Other lack of coordination: Secondary | ICD-10-CM | POA: Diagnosis not present

## 2014-12-22 DIAGNOSIS — M179 Osteoarthritis of knee, unspecified: Secondary | ICD-10-CM | POA: Diagnosis not present

## 2014-12-22 DIAGNOSIS — M6281 Muscle weakness (generalized): Secondary | ICD-10-CM | POA: Diagnosis not present

## 2014-12-22 DIAGNOSIS — R2689 Other abnormalities of gait and mobility: Secondary | ICD-10-CM | POA: Diagnosis not present

## 2014-12-22 DIAGNOSIS — R278 Other lack of coordination: Secondary | ICD-10-CM | POA: Diagnosis not present

## 2015-01-04 DIAGNOSIS — R2689 Other abnormalities of gait and mobility: Secondary | ICD-10-CM | POA: Diagnosis not present

## 2015-01-04 DIAGNOSIS — M6281 Muscle weakness (generalized): Secondary | ICD-10-CM | POA: Diagnosis not present

## 2015-01-04 DIAGNOSIS — M179 Osteoarthritis of knee, unspecified: Secondary | ICD-10-CM | POA: Diagnosis not present

## 2015-01-04 DIAGNOSIS — R278 Other lack of coordination: Secondary | ICD-10-CM | POA: Diagnosis not present

## 2015-01-05 DIAGNOSIS — R2689 Other abnormalities of gait and mobility: Secondary | ICD-10-CM | POA: Diagnosis not present

## 2015-01-05 DIAGNOSIS — M6281 Muscle weakness (generalized): Secondary | ICD-10-CM | POA: Diagnosis not present

## 2015-01-05 DIAGNOSIS — M179 Osteoarthritis of knee, unspecified: Secondary | ICD-10-CM | POA: Diagnosis not present

## 2015-01-05 DIAGNOSIS — R278 Other lack of coordination: Secondary | ICD-10-CM | POA: Diagnosis not present

## 2015-01-09 DIAGNOSIS — R2689 Other abnormalities of gait and mobility: Secondary | ICD-10-CM | POA: Diagnosis not present

## 2015-01-09 DIAGNOSIS — R278 Other lack of coordination: Secondary | ICD-10-CM | POA: Diagnosis not present

## 2015-01-09 DIAGNOSIS — M179 Osteoarthritis of knee, unspecified: Secondary | ICD-10-CM | POA: Diagnosis not present

## 2015-01-09 DIAGNOSIS — M6281 Muscle weakness (generalized): Secondary | ICD-10-CM | POA: Diagnosis not present

## 2015-01-10 DIAGNOSIS — M6281 Muscle weakness (generalized): Secondary | ICD-10-CM | POA: Diagnosis not present

## 2015-01-10 DIAGNOSIS — R278 Other lack of coordination: Secondary | ICD-10-CM | POA: Diagnosis not present

## 2015-01-10 DIAGNOSIS — R2689 Other abnormalities of gait and mobility: Secondary | ICD-10-CM | POA: Diagnosis not present

## 2015-01-10 DIAGNOSIS — M179 Osteoarthritis of knee, unspecified: Secondary | ICD-10-CM | POA: Diagnosis not present

## 2015-01-11 DIAGNOSIS — R2689 Other abnormalities of gait and mobility: Secondary | ICD-10-CM | POA: Diagnosis not present

## 2015-01-11 DIAGNOSIS — R278 Other lack of coordination: Secondary | ICD-10-CM | POA: Diagnosis not present

## 2015-01-11 DIAGNOSIS — M6281 Muscle weakness (generalized): Secondary | ICD-10-CM | POA: Diagnosis not present

## 2015-01-11 DIAGNOSIS — M179 Osteoarthritis of knee, unspecified: Secondary | ICD-10-CM | POA: Diagnosis not present

## 2015-01-17 DIAGNOSIS — F322 Major depressive disorder, single episode, severe without psychotic features: Secondary | ICD-10-CM | POA: Diagnosis not present

## 2015-01-17 DIAGNOSIS — M1990 Unspecified osteoarthritis, unspecified site: Secondary | ICD-10-CM | POA: Diagnosis not present

## 2015-01-18 DIAGNOSIS — R2689 Other abnormalities of gait and mobility: Secondary | ICD-10-CM | POA: Diagnosis not present

## 2015-01-18 DIAGNOSIS — R278 Other lack of coordination: Secondary | ICD-10-CM | POA: Diagnosis not present

## 2015-01-18 DIAGNOSIS — M6281 Muscle weakness (generalized): Secondary | ICD-10-CM | POA: Diagnosis not present

## 2015-01-18 DIAGNOSIS — M179 Osteoarthritis of knee, unspecified: Secondary | ICD-10-CM | POA: Diagnosis not present

## 2015-01-22 ENCOUNTER — Ambulatory Visit (INDEPENDENT_AMBULATORY_CARE_PROVIDER_SITE_OTHER): Payer: Medicare Other | Admitting: *Deleted

## 2015-01-22 DIAGNOSIS — I429 Cardiomyopathy, unspecified: Secondary | ICD-10-CM

## 2015-01-22 DIAGNOSIS — I5022 Chronic systolic (congestive) heart failure: Secondary | ICD-10-CM

## 2015-01-22 DIAGNOSIS — Z950 Presence of cardiac pacemaker: Secondary | ICD-10-CM | POA: Diagnosis not present

## 2015-01-23 ENCOUNTER — Encounter: Payer: Self-pay | Admitting: Internal Medicine

## 2015-01-23 DIAGNOSIS — M6281 Muscle weakness (generalized): Secondary | ICD-10-CM | POA: Diagnosis not present

## 2015-01-23 DIAGNOSIS — R2689 Other abnormalities of gait and mobility: Secondary | ICD-10-CM | POA: Diagnosis not present

## 2015-01-23 DIAGNOSIS — R278 Other lack of coordination: Secondary | ICD-10-CM | POA: Diagnosis not present

## 2015-01-23 DIAGNOSIS — M179 Osteoarthritis of knee, unspecified: Secondary | ICD-10-CM | POA: Diagnosis not present

## 2015-01-23 LAB — CUP PACEART INCLINIC DEVICE CHECK
Battery Voltage: 3.02 V
Brady Statistic AP VP Percent: 7.8 %
Brady Statistic AP VS Percent: 0.1 % — CL
Brady Statistic AS VP Percent: 92.1 %
Brady Statistic AS VS Percent: 0.1 % — CL
Date Time Interrogation Session: 20160621101733
Lead Channel Impedance Value: 494 Ohm
Lead Channel Impedance Value: 608 Ohm
Lead Channel Impedance Value: 646 Ohm
Lead Channel Pacing Threshold Amplitude: 0.75 V
Lead Channel Pacing Threshold Amplitude: 0.875 V
Lead Channel Pacing Threshold Amplitude: 1 V
Lead Channel Pacing Threshold Pulse Width: 0.4 ms
Lead Channel Pacing Threshold Pulse Width: 0.4 ms
Lead Channel Pacing Threshold Pulse Width: 0.4 ms
Lead Channel Sensing Intrinsic Amplitude: 1.9 mV
Lead Channel Sensing Intrinsic Amplitude: 6.6 mV
Lead Channel Setting Pacing Amplitude: 2 V
Lead Channel Setting Pacing Amplitude: 2 V
Lead Channel Setting Pacing Amplitude: 2.5 V
Lead Channel Setting Pacing Pulse Width: 0.4 ms
Lead Channel Setting Pacing Pulse Width: 0.4 ms
Lead Channel Setting Sensing Sensitivity: 2.8 mV
Zone Setting Detection Interval: 350 ms
Zone Setting Detection Interval: 400 ms

## 2015-01-23 NOTE — Progress Notes (Signed)
CRT-P device check in clinic. Normal device function. Thresholds, sensing, impedance consistent with previous measurements. Histograms appropriate for patient and level of activity. No mode switches or ventricular high rate episodes. Patient bi-ventricularly pacing 99.9% of the time. Device programmed with appropriate safety margins. Device heart failure diagnostics are within normal limits and stable over time---OptiVol abnormal 08/25/14--08/31/14. Estimated longevity 81yrs. ROV w/ SK in 68mo.

## 2015-01-29 ENCOUNTER — Other Ambulatory Visit: Payer: Self-pay

## 2015-03-01 DIAGNOSIS — M1711 Unilateral primary osteoarthritis, right knee: Secondary | ICD-10-CM | POA: Diagnosis not present

## 2015-03-07 ENCOUNTER — Encounter: Payer: Self-pay | Admitting: Cardiology

## 2015-03-08 ENCOUNTER — Other Ambulatory Visit: Payer: Self-pay | Admitting: Cardiology

## 2015-04-24 DIAGNOSIS — S81831A Puncture wound without foreign body, right lower leg, initial encounter: Secondary | ICD-10-CM | POA: Diagnosis not present

## 2015-04-24 DIAGNOSIS — Z23 Encounter for immunization: Secondary | ICD-10-CM | POA: Diagnosis not present

## 2015-04-24 DIAGNOSIS — L98499 Non-pressure chronic ulcer of skin of other sites with unspecified severity: Secondary | ICD-10-CM | POA: Diagnosis not present

## 2015-04-25 DIAGNOSIS — S81801A Unspecified open wound, right lower leg, initial encounter: Secondary | ICD-10-CM | POA: Diagnosis not present

## 2015-05-02 DIAGNOSIS — S81801D Unspecified open wound, right lower leg, subsequent encounter: Secondary | ICD-10-CM | POA: Diagnosis not present

## 2015-05-07 DIAGNOSIS — R350 Frequency of micturition: Secondary | ICD-10-CM | POA: Diagnosis not present

## 2015-05-15 ENCOUNTER — Ambulatory Visit (INDEPENDENT_AMBULATORY_CARE_PROVIDER_SITE_OTHER): Payer: Medicare Other | Admitting: Cardiology

## 2015-05-15 ENCOUNTER — Encounter: Payer: Self-pay | Admitting: Cardiology

## 2015-05-15 VITALS — BP 160/82 | HR 79 | Ht 64.0 in | Wt 181.0 lb

## 2015-05-15 DIAGNOSIS — I5022 Chronic systolic (congestive) heart failure: Secondary | ICD-10-CM | POA: Diagnosis not present

## 2015-05-15 DIAGNOSIS — I2583 Coronary atherosclerosis due to lipid rich plaque: Principal | ICD-10-CM

## 2015-05-15 DIAGNOSIS — I10 Essential (primary) hypertension: Secondary | ICD-10-CM

## 2015-05-15 DIAGNOSIS — I2510 Atherosclerotic heart disease of native coronary artery without angina pectoris: Secondary | ICD-10-CM | POA: Diagnosis not present

## 2015-05-15 DIAGNOSIS — I429 Cardiomyopathy, unspecified: Secondary | ICD-10-CM

## 2015-05-15 DIAGNOSIS — E785 Hyperlipidemia, unspecified: Secondary | ICD-10-CM

## 2015-05-15 NOTE — Progress Notes (Signed)
Cardiology Office Note   Date:  05/15/2015   ID:  [name], DOB Oct 10, 1922, MRN [id number]  PCP:  Simona Huh, MD    Chief Complaint  Patient presents with  . Coronary Artery Disease  . Hypertension  . Congestive Heart Failure      History of Present Illness: [name] is a 81 y.o. female with a history of ASCAD, chronic stable angina, ischemic DCM, LBBB, BiVPPM, HTN, dyslipidemia and chronic systolic CHF who presents today for followup. She is doing well. She denies any anginal chest pain, dizziness, palpitations or syncope. She has chronic LE edema and has a RLE ulcer that is being treated currently. She has chronic DOE which is stable.    Past Medical History  Diagnosis Date  . Macular degeneration   . Mild mitral regurgitation     Mild TR   . Osteopenia   . LVF (left ventricular failure) (HCC)     Mild, w/ PASP 73mmHg  . Vitamin B12 deficiency   . Breast cancer ([place])     bilaterally  . Cardiomyopathy ([place])     Mixed CM; s/p St. Jude CRT-P implant 10/24 w/ LV lead revision 10/25   . LBBB (left bundle branch block)   . Chronic systolic CHF (congestive heart failure) ([place])   . Hypertension   . Pacemaker   . GERD (gastroesophageal reflux disease)     occ  . Umbilical hernia     Repaired on 01/10/2013   . Anginal pain ([place])     "q 6 months or so" (02/23/2013)  . Hematuria 02/23/2013    w/clots required hospital admission on 02/23/2013  . Exertional dyspnea     "before stent in 2010" (02/23/2013)  . Osteoarthritis     "right knee" (02/23/2013)  . CAD (coronary artery disease)     w/ DES to LAD with residual diagonal disease to small for PCI 6/11 and repeat DES to LAD for restenosis 3/12    Past Surgical History  Procedure Laterality Date  . Coronary angioplasty with stent placement  2010    "1" (05/27/2012)  . Total hip arthroplasty  2011    right  . Mastectomy  1995    bilaterally  . Breast biopsy  1995    bilaterally  .  Cataract extraction w/ intraocular lens  implant, bilateral  1990's  . Joint replacement    . Tonsillectomy    . Umbilical hernia repair N/A 01/10/2013    Procedure: HERNIA REPAIR UMBILICAL ADULT;  Surgeon: Haywood Lasso, MD;  Location: [place];  Service: General;  Laterality: N/A;  . Insertion of mesh N/A 01/10/2013    Procedure: INSERTION OF MESH;  Surgeon: Haywood Lasso, MD;  Location: [place];  Service: General;  Laterality: N/A;  . Hernia repair    . Insert / replace / remove pacemaker  05/27/2012    initial placement - BiV PPM  . Insert / replace / remove pacemaker  05/31/2012    "lead change/repaired" (05/31/2012)  . Bi-ventricular pacemaker insertion N/A 05/27/2012    Procedure: BI-VENTRICULAR PACEMAKER INSERTION (CRT-P);  Surgeon: Deboraha Sprang, MD;  Location: [facility] CATH LAB;  Service: Cardiovascular;  Laterality: N/A;  . Lead revision N/A 05/28/2012    Procedure: LEAD REVISION;  Surgeon: Evans Lance, MD;  Location: [facility] CATH LAB;  Service: Cardiovascular;  Laterality: N/A;  . Lead revision Left 05/31/2012  Procedure: LEAD REVISION;  Surgeon: Deboraha Sprang, MD;  Location: [facility] CATH LAB;  Service: Cardiovascular;  Laterality: Left;     Current Outpatient Prescriptions  Medication Sig Dispense Refill  . aspirin EC 81 MG tablet Take 81 mg by mouth daily.    . carvedilol (COREG) 6.25 MG tablet Take 1 tablet (6.25 mg total) by mouth 2 (two) times daily. 180 tablet 1  . Cholecalciferol (VITAMIN D-3) 5000 UNITS TABS Take 5,000 Units by mouth daily.     . clopidogrel (PLAVIX) 75 MG tablet Take 75 mg by mouth daily.    . furosemide (LASIX) 40 MG tablet Take 40 mg by mouth every other day alternating with 20 mg by mouth every other day.    . isosorbide mononitrate (IMDUR) 60 MG 24 hr tablet TAKE ONE AND ONE-HALF TABLETS  BY MOUTH DAILY    . nitroGLYCERIN (NITROSTAT) 0.4 MG SL tablet Place 0.4 mg under the tongue every 5 (five) minutes as needed for chest pain.     . ramipril (ALTACE) 5  MG capsule Take 1 capsule (5 mg total) by mouth daily. 30 capsule 6  . simvastatin (ZOCOR) 20 MG tablet Take 1 tablet (20 mg total) by mouth daily at 6 PM. 90 tablet 3  . traMADol (ULTRAM) 50 MG tablet Take 100 mg by mouth as directed. Take two tablets in the morning, and if needed, two more in the evening for pain.     No current facility-administered medications for this visit.    Allergies:   Review of patient's allergies indicates no known allergies.    Social History:  The patient  reports that she quit smoking about 52 years ago. Her smoking use included Cigarettes. She has a 27 pack-year smoking history. She has never used smokeless tobacco. She reports that she drinks about 3.0 oz of alcohol per week. She reports that she does not use illicit drugs.   Family History:  The patient's family history includes Heart disease in her father and mother.    ROS:  Please see the history of present illness.   Otherwise, review of systems are positive for none.   All other systems are reviewed and negative.    PHYSICAL EXAM: VS:  BP 160/82 mmHg  Pulse 79  Ht 5\' 4"  (1.626 m)  Wt 181 lb (82.101 kg)  BMI 31.05 kg/m2  SpO2 94% , BMI Body mass index is 31.05 kg/(m^2). GEN: Well nourished, well developed, in no acute distress HEENT: normal Neck: no JVD, carotid bruits, or masses Cardiac: RRR; no murmurs, rubs, or gallops.  1+ edema Respiratory:  clear to auscultation bilaterally, normal work of breathing GI: soft, nontender, nondistended, + BS MS: no deformity or atrophy Skin: warm and dry, no rash Neuro:  Strength and sensation are intact Psych: euthymic mood, full affect   EKG:  EKG was ordered today and showed NSR with V pacing and PVC's bigeminal   Recent Labs: 06/16/2014: ALT 15 11/08/2014: BUN 25*; Creatinine, Ser 1.13; Potassium 4.0; Sodium 140    Lipid Panel    Component Value Date/Time   CHOL 117 06/16/2014 1005   TRIG 74.0 06/16/2014 1005   HDL 43.00 06/16/2014 1005    CHOLHDL 3 06/16/2014 1005   VLDL 14.8 06/16/2014 1005   LDLCALC 59 06/16/2014 1005      Wt Readings from Last 3 Encounters:  05/15/15 181 lb (82.101 kg)  11/08/14 179 lb 9.6 oz (81.466 kg)  07/06/14 176 lb (79.833 kg)  ASSESSMENT AND PLAN:  1. ASCAD with no anginal CP - continue ASA/Imdur /Plavix/BB 2. HTN - controlled - continue Ramipril/carvedilol  3. Dyslipidemia - LDL at goal - continue simvastatin  - check FLP and ALT  4. SOB - appears stable  5. Chronic systolic CHF - appears well compensated on exam  -continue coreg/ramipril/Lasix - check BMET  6. Ischemic/nonischemic DCM EF 35-40% by cath 2012 and 55-60% by echo 04/2014  Current medicines are reviewed at length with the patient today.  The patient does not have concerns regarding medicines.  The following changes have been made:  no change  Labs/ tests ordered today: See above Assessment and Plan  Orders Placed This Encounter  Procedures  . EKG 12-Lead     Disposition:   FU with me in 6 months  Signed, Sueanne Margarita, MD  05/15/2015 2:10 PM    [facility] [place], [place], [place]  [address] Phone: [phone]; Fax: [phone]

## 2015-05-15 NOTE — Patient Instructions (Signed)
Medication Instructions:  Your physician recommends that you continue on your current medications as directed. Please refer to the Current Medication list given to you today.   Labwork: Your physician recommends that you return for FASTING lab work.  Testing/Procedures: None  Follow-Up: Your physician wants you to follow-up in: 6 months with Dr. Turner. You will receive a reminder letter in the mail two months in advance. If you don't receive a letter, please call our office to schedule the follow-up appointment.   Any Other Special Instructions Will Be Listed Below (If Applicable).   

## 2015-05-15 NOTE — Addendum Note (Signed)
Addended by: Harland German A on: 05/15/2015 02:16 PM   Modules accepted: Orders

## 2015-05-25 ENCOUNTER — Other Ambulatory Visit (INDEPENDENT_AMBULATORY_CARE_PROVIDER_SITE_OTHER): Payer: Medicare Other | Admitting: *Deleted

## 2015-05-25 DIAGNOSIS — I5022 Chronic systolic (congestive) heart failure: Secondary | ICD-10-CM

## 2015-05-25 DIAGNOSIS — I2583 Coronary atherosclerosis due to lipid rich plaque: Secondary | ICD-10-CM

## 2015-05-25 DIAGNOSIS — I429 Cardiomyopathy, unspecified: Secondary | ICD-10-CM | POA: Diagnosis not present

## 2015-05-25 DIAGNOSIS — I447 Left bundle-branch block, unspecified: Secondary | ICD-10-CM | POA: Diagnosis not present

## 2015-05-25 DIAGNOSIS — I509 Heart failure, unspecified: Secondary | ICD-10-CM | POA: Diagnosis not present

## 2015-05-25 DIAGNOSIS — I2510 Atherosclerotic heart disease of native coronary artery without angina pectoris: Secondary | ICD-10-CM

## 2015-05-25 DIAGNOSIS — E785 Hyperlipidemia, unspecified: Secondary | ICD-10-CM

## 2015-05-25 DIAGNOSIS — I428 Other cardiomyopathies: Secondary | ICD-10-CM | POA: Diagnosis not present

## 2015-05-25 LAB — BASIC METABOLIC PANEL
BUN: 24 mg/dL (ref 7–25)
CO2: 31 mmol/L (ref 20–31)
Calcium: 8.7 mg/dL (ref 8.6–10.4)
Chloride: 99 mmol/L (ref 98–110)
Creat: 1.12 mg/dL — ABNORMAL HIGH (ref 0.60–0.88)
Glucose, Bld: 90 mg/dL (ref 65–99)
Potassium: 3.2 mmol/L — ABNORMAL LOW (ref 3.5–5.3)
Sodium: 142 mmol/L (ref 135–146)

## 2015-05-25 LAB — LIPID PANEL
Cholesterol: 98 mg/dL — ABNORMAL LOW (ref 125–200)
HDL: 56 mg/dL (ref >=46)
LDL Cholesterol: 29 mg/dL (ref <130)
Total CHOL/HDL Ratio: 1.8 Ratio (ref <=5.0)
Triglycerides: 67 mg/dL (ref <150)
VLDL: 13 mg/dL (ref <30)

## 2015-05-25 LAB — HEPATIC FUNCTION PANEL
ALT: 16 U/L (ref 6–29)
AST: 22 U/L (ref 10–35)
Albumin: 3.2 g/dL — ABNORMAL LOW (ref 3.6–5.1)
Alkaline Phosphatase: 140 U/L — ABNORMAL HIGH (ref 33–130)
Bilirubin, Direct: 0.2 mg/dL (ref <=0.2)
Indirect Bilirubin: 0.5 mg/dL (ref 0.2–1.2)
Total Bilirubin: 0.7 mg/dL (ref 0.2–1.2)
Total Protein: 6.1 g/dL (ref 6.1–8.1)

## 2015-05-25 NOTE — Addendum Note (Signed)
Addended by: Eulis Foster on: 05/25/2015 10:46 AM   Modules accepted: Orders

## 2015-05-25 NOTE — Addendum Note (Signed)
Addended by: Eulis Foster on: 05/25/2015 10:48 AM   Modules accepted: Orders

## 2015-05-28 ENCOUNTER — Telehealth: Payer: Self-pay

## 2015-05-28 DIAGNOSIS — I10 Essential (primary) hypertension: Secondary | ICD-10-CM

## 2015-05-28 MED ORDER — POTASSIUM CHLORIDE CRYS ER 20 MEQ PO TBCR: 40.0000 meq | EXTENDED_RELEASE_TABLET | Freq: Every day | ORAL | Status: DC

## 2015-05-28 MED ORDER — POTASSIUM CHLORIDE CRYS ER 20 MEQ PO TBCR: 20.0000 meq | EXTENDED_RELEASE_TABLET | Freq: Every day | ORAL | Status: DC

## 2015-05-28 NOTE — Telephone Encounter (Signed)
-----  Message from Sueanne Margarita, MD sent at 05/27/2015 10:03 PM EDT ----- Add Kdur 93meq take 2 tablets BID on 10/24 and then 2 tablets daily starting 10/25 and recheck BMET in 1 week. Forward labs to PCP to followup on elevated alk phos

## 2015-05-28 NOTE — Telephone Encounter (Signed)
Informed patient of results and verbal understanding expressed.  Instructed patient to START KDUR 64meq 2 tablets daily. Patient understands to take 13meq 2 tablets BID for first two days. Repeat BMET scheduled for next Monday. Patient agrees with treatment plan.

## 2015-05-30 DIAGNOSIS — S81801D Unspecified open wound, right lower leg, subsequent encounter: Secondary | ICD-10-CM | POA: Diagnosis not present

## 2015-06-04 ENCOUNTER — Other Ambulatory Visit: Payer: Medicare Other

## 2015-06-07 ENCOUNTER — Other Ambulatory Visit: Payer: Self-pay | Admitting: Cardiology

## 2015-06-11 ENCOUNTER — Other Ambulatory Visit: Payer: Self-pay | Admitting: Cardiology

## 2015-06-11 MED ORDER — FUROSEMIDE 40 MG PO TABS: 40.0000 mg | ORAL_TABLET | ORAL | Status: DC

## 2015-06-11 NOTE — Telephone Encounter (Signed)
New message      *STAT* If patient is at the pharmacy, call can be transferred to refill team.   1. Which medications need to be refilled? (please list name of each medication and dose if known) lasix 20mg  2. Which pharmacy/location (including street and city if local pharmacy) is medication to be sent to? [facility] 3. Do they need a 30 day or 90 day supply? 75 Presc was denied---pharmacy said pt was taking this.

## 2015-06-11 NOTE — Telephone Encounter (Signed)
Pt's medication sent to her pharmacy

## 2015-07-02 DIAGNOSIS — S81801D Unspecified open wound, right lower leg, subsequent encounter: Secondary | ICD-10-CM | POA: Diagnosis not present

## 2015-07-11 ENCOUNTER — Ambulatory Visit
Admission: RE | Admit: 2015-07-11 | Discharge: 2015-07-11 | Disposition: A | Discharge status: Home / Self Care | Payer: Medicare Other | Source: Ambulatory Visit | Attending: Family Medicine | Admitting: Family Medicine

## 2015-07-11 ENCOUNTER — Other Ambulatory Visit: Payer: Self-pay | Admitting: Family Medicine

## 2015-07-11 DIAGNOSIS — R05 Cough: Secondary | ICD-10-CM

## 2015-07-11 DIAGNOSIS — R059 Cough, unspecified: Secondary | ICD-10-CM

## 2015-07-11 DIAGNOSIS — I2510 Atherosclerotic heart disease of native coronary artery without angina pectoris: Secondary | ICD-10-CM | POA: Diagnosis not present

## 2015-07-11 DIAGNOSIS — I209 Angina pectoris, unspecified: Secondary | ICD-10-CM | POA: Diagnosis not present

## 2015-07-11 DIAGNOSIS — R0600 Dyspnea, unspecified: Secondary | ICD-10-CM | POA: Diagnosis not present

## 2015-07-11 DIAGNOSIS — N289 Disorder of kidney and ureter, unspecified: Secondary | ICD-10-CM | POA: Diagnosis not present

## 2015-07-11 DIAGNOSIS — Z23 Encounter for immunization: Secondary | ICD-10-CM | POA: Diagnosis not present

## 2015-07-12 ENCOUNTER — Other Ambulatory Visit: Payer: Self-pay | Admitting: Cardiology

## 2015-07-21 ENCOUNTER — Other Ambulatory Visit: Payer: Self-pay | Admitting: Cardiology

## 2015-07-25 ENCOUNTER — Other Ambulatory Visit: Payer: Self-pay | Admitting: Internal Medicine

## 2015-07-31 ENCOUNTER — Encounter: Payer: Self-pay | Admitting: *Deleted

## 2015-08-05 DIAGNOSIS — I272 Pulmonary hypertension, unspecified: Secondary | ICD-10-CM

## 2015-08-05 HISTORY — DX: Pulmonary hypertension, unspecified: I27.20

## 2015-08-07 ENCOUNTER — Other Ambulatory Visit: Payer: Self-pay | Admitting: Cardiology

## 2015-08-07 DIAGNOSIS — J069 Acute upper respiratory infection, unspecified: Secondary | ICD-10-CM | POA: Diagnosis not present

## 2015-08-23 ENCOUNTER — Encounter: Payer: Self-pay | Admitting: Internal Medicine

## 2015-08-23 ENCOUNTER — Ambulatory Visit (INDEPENDENT_AMBULATORY_CARE_PROVIDER_SITE_OTHER): Payer: Medicare Other | Admitting: Internal Medicine

## 2015-08-23 VITALS — BP 142/74 | HR 64 | Ht 64.0 in | Wt 175.0 lb

## 2015-08-23 DIAGNOSIS — I255 Ischemic cardiomyopathy: Secondary | ICD-10-CM

## 2015-08-23 DIAGNOSIS — I5022 Chronic systolic (congestive) heart failure: Secondary | ICD-10-CM

## 2015-08-23 DIAGNOSIS — Z950 Presence of cardiac pacemaker: Secondary | ICD-10-CM | POA: Diagnosis not present

## 2015-08-23 DIAGNOSIS — I447 Left bundle-branch block, unspecified: Secondary | ICD-10-CM

## 2015-08-23 NOTE — Progress Notes (Signed)
Patient Care Team: Gaynelle Arabian, MD as PCP - General (Family Medicine)   HPI  [name] is a 82 y.o. female Seen in followup for congestive heart failure in the setting of ischemic and ischemic heart disease status post CRT D. Implantation complicated by lead dislodgment X3 She thinks that she is functionally improved since the CRT implantation. Her edema while chronic is less.although it comes and goes.    She has no major complaints her husband has intercurrently died. Her health she says is some better.  She does note that there is periodic dyspnea. It is mostly with exertion and does not correlate particularly well with her fluid status. There is no accompanying chest pain.  K 3.2 10/16b repletion was initiated follow-up being that was not done  Interval echocardiogram 9/15 demonstrated normalization of LV systolic function with mild left atrial dilatation  Past Medical History  Diagnosis Date  . Macular degeneration   . Mild mitral regurgitation     Mild TR   . Osteopenia   . LVF (left ventricular failure) (HCC)     Mild, w/ PASP 46mmHg  . Vitamin B12 deficiency   . Breast cancer ([place])     bilaterally  . Cardiomyopathy ([place])     Mixed CM; s/p St. Jude CRT-P implant 10/24 w/ LV lead revision 10/25   . LBBB (left bundle branch block)   . Chronic systolic CHF (congestive heart failure) ([place])   . Hypertension   . Pacemaker   . GERD (gastroesophageal reflux disease)     occ  . Umbilical hernia     Repaired on 01/10/2013   . Anginal pain ([place])     "q 6 months or so" (02/23/2013)  . Hematuria 02/23/2013    w/clots required hospital admission on 02/23/2013  . Exertional dyspnea     "before stent in 2010" (02/23/2013)  . Osteoarthritis     "right knee" (02/23/2013)  . CAD (coronary artery disease)     w/ DES to LAD with residual diagonal disease to small for PCI 6/11 and repeat DES to LAD for restenosis 3/12    Past Surgical History  Procedure Laterality Date  .  Coronary angioplasty with stent placement  2010    "1" (05/27/2012)  . Total hip arthroplasty  2011    right  . Mastectomy  1995    bilaterally  . Breast biopsy  1995    bilaterally  . Cataract extraction w/ intraocular lens  implant, bilateral  1990's  . Joint replacement    . Tonsillectomy    . Umbilical hernia repair N/A 01/10/2013    Procedure: HERNIA REPAIR UMBILICAL ADULT;  Surgeon: Haywood Lasso, MD;  Location: [place];  Service: General;  Laterality: N/A;  . Insertion of mesh N/A 01/10/2013    Procedure: INSERTION OF MESH;  Surgeon: Haywood Lasso, MD;  Location: [place];  Service: General;  Laterality: N/A;  . Hernia repair    . Insert / replace / remove pacemaker  05/27/2012    initial placement - BiV PPM  . Insert / replace / remove pacemaker  05/31/2012    "lead change/repaired" (05/31/2012)  . Bi-ventricular pacemaker insertion N/A 05/27/2012    Procedure: BI-VENTRICULAR PACEMAKER INSERTION (CRT-P);  Surgeon: Deboraha Sprang, MD;  Location: [facility] CATH LAB;  Service: Cardiovascular;  Laterality: N/A;  . Lead revision N/A 05/28/2012    Procedure: LEAD REVISION;  Surgeon: Evans Lance, MD;  Location: [facility] CATH LAB;  Service: Cardiovascular;  Laterality:  N/A;  . Lead revision Left 05/31/2012    Procedure: LEAD REVISION;  Surgeon: Deboraha Sprang, MD;  Location: [facility] CATH LAB;  Service: Cardiovascular;  Laterality: Left;    Current Outpatient Prescriptions  Medication Sig Dispense Refill  . aspirin EC 81 MG tablet Take 81 mg by mouth daily.    . carvedilol (COREG) 6.25 MG tablet TAKE ONE TABLET TWICE DAILY 180 tablet 3  . Cholecalciferol (VITAMIN D-3) 5000 UNITS TABS Take 5,000 Units by mouth daily.     . clopidogrel (PLAVIX) 75 MG tablet TAKE ONE TABLET EACH DAY 30 tablet 3  . furosemide (LASIX) 40 MG tablet Take 1 tablet (40 mg total) by mouth every other day. Take 40 mg by mouth every other day alternating with 20 mg by mouth every other day. 30 tablet 6  . isosorbide  mononitrate (IMDUR) 60 MG 24 hr tablet TAKE ONE AND ONE-HALF TABLETS  BY MOUTH DAILY    . nitroGLYCERIN (NITROSTAT) 0.4 MG SL tablet Place 0.4 mg under the tongue every 5 (five) minutes as needed for chest pain.     . potassium chloride SA (K-DUR,KLOR-CON) 20 MEQ tablet Take 2 tablets (40 mEq total) by mouth daily. 60 tablet 6  . ramipril (ALTACE) 5 MG capsule Take 1 capsule (5 mg total) by mouth daily. 30 capsule 6  . simvastatin (ZOCOR) 20 MG tablet Take 1 tablet (20 mg total) by mouth daily at 6 PM. 90 tablet 2  . traMADol (ULTRAM) 50 MG tablet Take 100 mg by mouth as directed. Take two tablets in the morning, and if needed, two more in the evening for pain.     No current facility-administered medications for this visit.    No Known Allergies  Review of Systems negative except from HPI and PMH  Physical Exam BP 142/74 mmHg  Pulse 64  Ht 5\' 4"  (1.626 m)  Wt 175 lb (79.379 kg)  BMI 30.02 kg/m2 Well developed and well nourished in no acute distress HENT normal E scleral and icterus clear Neck Supple JVP flat; carotids brisk and full Clear to ausculation Device pocket well healed; without hematoma or erythema   Regular rate and rhythm, no murmurs gallops or rub Soft with active bowel sounds No clubbing cyanosis 2+ and non-pitting Edema Alert and oriented, grossly normal motor and sensory function Skin Warm and Dry  ecg  P-synchronous/ AV  pacing   Assessment and  Plan  Ischemic and nonischemic heart disease  Cardiomyopathy-resolved  CRT P  Chronic edema  hypokalemia LV wall  She needs repeat K  supplementation was started following blood work in October; follow-up vein that was not accomplished  Her exertional shortness of breath is certainly concerning for heart failure. Her volume status in her mind is euvolemic despite her terrible chronic edema. With her history of ischemic and nonischemic disease the differential diagnosis would include progressive ischemia as  well as issues related to a pacemaker timing. The latter is here to address and so we'll undertake an AV optimization echo.  Heart rate excursion appeared adequate  If after a couple of weeks if no improvement we will empirically increase her carvedilol from 3--6; we have discussed the concerns about its potential negative impact on blood pressure which at home runs about 120 mm. In the event that she developed dizziness we went back off.

## 2015-08-23 NOTE — Patient Instructions (Signed)
Medication Instructions: - no changes  Labwork: - none  Procedures/Testing: - Your physician has recommended that you have an AV optimization echo. During this procedure, an echocardiogram is performed to optimize the timing of your device using ultrasound and a device programmer. Changes will be made to the device settings to help the heart chambers pump more efficiently. This procedure takes approximately one hour.  Follow-Up: - Your physician recommends that you schedule a follow-up appointment in: 3 months with Dr. Radford Pax  - Your physician wants you to follow-up in: 6 months with [facility] & 1 year with Dr. Caryl Comes. You will receive a reminder letter in the mail two months in advance. If you don't receive a letter, please call our office to schedule the follow-up appointment.   Any Additional Special Instructions Will Be Listed Below (If Applicable).     If you need a refill on your cardiac medications before your next appointment, please call your pharmacy.

## 2015-08-30 DIAGNOSIS — R260 Ataxic gait: Secondary | ICD-10-CM | POA: Diagnosis not present

## 2015-08-30 DIAGNOSIS — R2681 Unsteadiness on feet: Secondary | ICD-10-CM | POA: Diagnosis not present

## 2015-08-30 DIAGNOSIS — M6281 Muscle weakness (generalized): Secondary | ICD-10-CM | POA: Diagnosis not present

## 2015-08-31 DIAGNOSIS — M6281 Muscle weakness (generalized): Secondary | ICD-10-CM | POA: Diagnosis not present

## 2015-08-31 DIAGNOSIS — R2681 Unsteadiness on feet: Secondary | ICD-10-CM | POA: Diagnosis not present

## 2015-08-31 DIAGNOSIS — R260 Ataxic gait: Secondary | ICD-10-CM | POA: Diagnosis not present

## 2015-09-04 ENCOUNTER — Ambulatory Visit (HOSPITAL_COMMUNITY): Payer: Medicare Other | Attending: Cardiology

## 2015-09-04 ENCOUNTER — Other Ambulatory Visit: Payer: Self-pay

## 2015-09-04 DIAGNOSIS — Z87891 Personal history of nicotine dependence: Secondary | ICD-10-CM | POA: Diagnosis not present

## 2015-09-04 DIAGNOSIS — I255 Ischemic cardiomyopathy: Secondary | ICD-10-CM

## 2015-09-04 DIAGNOSIS — I10 Essential (primary) hypertension: Secondary | ICD-10-CM | POA: Insufficient documentation

## 2015-09-04 DIAGNOSIS — I340 Nonrheumatic mitral (valve) insufficiency: Secondary | ICD-10-CM | POA: Insufficient documentation

## 2015-09-04 DIAGNOSIS — I447 Left bundle-branch block, unspecified: Secondary | ICD-10-CM | POA: Insufficient documentation

## 2015-09-04 DIAGNOSIS — I272 Other secondary pulmonary hypertension: Secondary | ICD-10-CM | POA: Insufficient documentation

## 2015-09-04 DIAGNOSIS — I509 Heart failure, unspecified: Secondary | ICD-10-CM | POA: Diagnosis present

## 2015-09-04 DIAGNOSIS — E785 Hyperlipidemia, unspecified: Secondary | ICD-10-CM | POA: Diagnosis not present

## 2015-09-04 DIAGNOSIS — I5022 Chronic systolic (congestive) heart failure: Secondary | ICD-10-CM

## 2015-09-04 DIAGNOSIS — E119 Type 2 diabetes mellitus without complications: Secondary | ICD-10-CM | POA: Diagnosis not present

## 2015-09-04 DIAGNOSIS — I517 Cardiomegaly: Secondary | ICD-10-CM | POA: Diagnosis not present

## 2015-09-06 DIAGNOSIS — M6281 Muscle weakness (generalized): Secondary | ICD-10-CM | POA: Diagnosis not present

## 2015-09-06 DIAGNOSIS — R260 Ataxic gait: Secondary | ICD-10-CM | POA: Diagnosis not present

## 2015-09-06 DIAGNOSIS — R2681 Unsteadiness on feet: Secondary | ICD-10-CM | POA: Diagnosis not present

## 2015-09-07 DIAGNOSIS — M6281 Muscle weakness (generalized): Secondary | ICD-10-CM | POA: Diagnosis not present

## 2015-09-07 DIAGNOSIS — R260 Ataxic gait: Secondary | ICD-10-CM | POA: Diagnosis not present

## 2015-09-07 DIAGNOSIS — R2681 Unsteadiness on feet: Secondary | ICD-10-CM | POA: Diagnosis not present

## 2015-09-10 ENCOUNTER — Telehealth: Payer: Self-pay | Admitting: Internal Medicine

## 2015-09-10 DIAGNOSIS — M6281 Muscle weakness (generalized): Secondary | ICD-10-CM | POA: Diagnosis not present

## 2015-09-10 DIAGNOSIS — R2681 Unsteadiness on feet: Secondary | ICD-10-CM | POA: Diagnosis not present

## 2015-09-10 DIAGNOSIS — R260 Ataxic gait: Secondary | ICD-10-CM | POA: Diagnosis not present

## 2015-09-10 NOTE — Telephone Encounter (Signed)
My apologies for not having called. I hadn't remember doing it as part of the timing procedure. The heart muscle function is improving good shape about 45 or 50% down a little bit from normal

## 2015-09-10 NOTE — Telephone Encounter (Signed)
New message  ° ° °Patient calling for test results.   °

## 2015-09-10 NOTE — Telephone Encounter (Signed)
Patient calling for echo results Have not been released yet by Dr.Klein

## 2015-09-12 DIAGNOSIS — R260 Ataxic gait: Secondary | ICD-10-CM | POA: Diagnosis not present

## 2015-09-12 DIAGNOSIS — M6281 Muscle weakness (generalized): Secondary | ICD-10-CM | POA: Diagnosis not present

## 2015-09-12 DIAGNOSIS — R2681 Unsteadiness on feet: Secondary | ICD-10-CM | POA: Diagnosis not present

## 2015-09-12 NOTE — Telephone Encounter (Signed)
Results of echo given to patient.  Thankful and appreciative

## 2015-09-18 DIAGNOSIS — R2681 Unsteadiness on feet: Secondary | ICD-10-CM | POA: Diagnosis not present

## 2015-09-18 DIAGNOSIS — M6281 Muscle weakness (generalized): Secondary | ICD-10-CM | POA: Diagnosis not present

## 2015-09-18 DIAGNOSIS — R260 Ataxic gait: Secondary | ICD-10-CM | POA: Diagnosis not present

## 2015-09-20 DIAGNOSIS — R2681 Unsteadiness on feet: Secondary | ICD-10-CM | POA: Diagnosis not present

## 2015-09-20 DIAGNOSIS — R260 Ataxic gait: Secondary | ICD-10-CM | POA: Diagnosis not present

## 2015-09-20 DIAGNOSIS — M6281 Muscle weakness (generalized): Secondary | ICD-10-CM | POA: Diagnosis not present

## 2015-09-21 DIAGNOSIS — M6281 Muscle weakness (generalized): Secondary | ICD-10-CM | POA: Diagnosis not present

## 2015-09-21 DIAGNOSIS — R260 Ataxic gait: Secondary | ICD-10-CM | POA: Diagnosis not present

## 2015-09-21 DIAGNOSIS — R2681 Unsteadiness on feet: Secondary | ICD-10-CM | POA: Diagnosis not present

## 2015-09-24 DIAGNOSIS — R2681 Unsteadiness on feet: Secondary | ICD-10-CM | POA: Diagnosis not present

## 2015-09-24 DIAGNOSIS — M6281 Muscle weakness (generalized): Secondary | ICD-10-CM | POA: Diagnosis not present

## 2015-09-24 DIAGNOSIS — R260 Ataxic gait: Secondary | ICD-10-CM | POA: Diagnosis not present

## 2015-09-26 DIAGNOSIS — R2681 Unsteadiness on feet: Secondary | ICD-10-CM | POA: Diagnosis not present

## 2015-09-26 DIAGNOSIS — R260 Ataxic gait: Secondary | ICD-10-CM | POA: Diagnosis not present

## 2015-09-26 DIAGNOSIS — M6281 Muscle weakness (generalized): Secondary | ICD-10-CM | POA: Diagnosis not present

## 2015-09-27 DIAGNOSIS — Z961 Presence of intraocular lens: Secondary | ICD-10-CM | POA: Diagnosis not present

## 2015-09-27 DIAGNOSIS — H353213 Exudative age-related macular degeneration, right eye, with inactive scar: Secondary | ICD-10-CM | POA: Diagnosis not present

## 2015-09-27 DIAGNOSIS — M6281 Muscle weakness (generalized): Secondary | ICD-10-CM | POA: Diagnosis not present

## 2015-09-27 DIAGNOSIS — R260 Ataxic gait: Secondary | ICD-10-CM | POA: Diagnosis not present

## 2015-09-27 DIAGNOSIS — E538 Deficiency of other specified B group vitamins: Secondary | ICD-10-CM | POA: Diagnosis not present

## 2015-09-27 DIAGNOSIS — H31011 Macula scars of posterior pole (postinflammatory) (post-traumatic), right eye: Secondary | ICD-10-CM | POA: Diagnosis not present

## 2015-09-27 DIAGNOSIS — R2681 Unsteadiness on feet: Secondary | ICD-10-CM | POA: Diagnosis not present

## 2015-09-27 DIAGNOSIS — H3589 Other specified retinal disorders: Secondary | ICD-10-CM | POA: Diagnosis not present

## 2015-09-27 DIAGNOSIS — H353124 Nonexudative age-related macular degeneration, left eye, advanced atrophic with subfoveal involvement: Secondary | ICD-10-CM | POA: Diagnosis not present

## 2015-10-01 DIAGNOSIS — M6281 Muscle weakness (generalized): Secondary | ICD-10-CM | POA: Diagnosis not present

## 2015-10-01 DIAGNOSIS — R260 Ataxic gait: Secondary | ICD-10-CM | POA: Diagnosis not present

## 2015-10-01 DIAGNOSIS — R2681 Unsteadiness on feet: Secondary | ICD-10-CM | POA: Diagnosis not present

## 2015-10-02 DIAGNOSIS — R260 Ataxic gait: Secondary | ICD-10-CM | POA: Diagnosis not present

## 2015-10-02 DIAGNOSIS — M6281 Muscle weakness (generalized): Secondary | ICD-10-CM | POA: Diagnosis not present

## 2015-10-02 DIAGNOSIS — R2681 Unsteadiness on feet: Secondary | ICD-10-CM | POA: Diagnosis not present

## 2015-10-04 DIAGNOSIS — R2681 Unsteadiness on feet: Secondary | ICD-10-CM | POA: Diagnosis not present

## 2015-10-04 DIAGNOSIS — R260 Ataxic gait: Secondary | ICD-10-CM | POA: Diagnosis not present

## 2015-10-04 DIAGNOSIS — M6281 Muscle weakness (generalized): Secondary | ICD-10-CM | POA: Diagnosis not present

## 2015-10-08 DIAGNOSIS — R260 Ataxic gait: Secondary | ICD-10-CM | POA: Diagnosis not present

## 2015-10-08 DIAGNOSIS — R2681 Unsteadiness on feet: Secondary | ICD-10-CM | POA: Diagnosis not present

## 2015-10-08 DIAGNOSIS — M6281 Muscle weakness (generalized): Secondary | ICD-10-CM | POA: Diagnosis not present

## 2015-10-09 DIAGNOSIS — M6281 Muscle weakness (generalized): Secondary | ICD-10-CM | POA: Diagnosis not present

## 2015-10-09 DIAGNOSIS — R2681 Unsteadiness on feet: Secondary | ICD-10-CM | POA: Diagnosis not present

## 2015-10-09 DIAGNOSIS — R260 Ataxic gait: Secondary | ICD-10-CM | POA: Diagnosis not present

## 2015-10-10 DIAGNOSIS — R2681 Unsteadiness on feet: Secondary | ICD-10-CM | POA: Diagnosis not present

## 2015-10-10 DIAGNOSIS — M6281 Muscle weakness (generalized): Secondary | ICD-10-CM | POA: Diagnosis not present

## 2015-10-10 DIAGNOSIS — R260 Ataxic gait: Secondary | ICD-10-CM | POA: Diagnosis not present

## 2015-10-16 DIAGNOSIS — R260 Ataxic gait: Secondary | ICD-10-CM | POA: Diagnosis not present

## 2015-10-16 DIAGNOSIS — M6281 Muscle weakness (generalized): Secondary | ICD-10-CM | POA: Diagnosis not present

## 2015-10-16 DIAGNOSIS — R2681 Unsteadiness on feet: Secondary | ICD-10-CM | POA: Diagnosis not present

## 2015-10-17 ENCOUNTER — Other Ambulatory Visit: Payer: Self-pay | Admitting: Family Medicine

## 2015-10-17 ENCOUNTER — Ambulatory Visit
Admission: RE | Admit: 2015-10-17 | Discharge: 2015-10-17 | Disposition: A | Discharge status: Home / Self Care | Payer: Medicare Other | Source: Ambulatory Visit | Attending: Family Medicine | Admitting: Family Medicine

## 2015-10-17 DIAGNOSIS — J9801 Acute bronchospasm: Secondary | ICD-10-CM | POA: Diagnosis not present

## 2015-10-17 DIAGNOSIS — R52 Pain, unspecified: Secondary | ICD-10-CM

## 2015-10-17 DIAGNOSIS — R0609 Other forms of dyspnea: Secondary | ICD-10-CM | POA: Diagnosis not present

## 2015-10-17 DIAGNOSIS — M25551 Pain in right hip: Secondary | ICD-10-CM | POA: Diagnosis not present

## 2015-10-17 DIAGNOSIS — M533 Sacrococcygeal disorders, not elsewhere classified: Secondary | ICD-10-CM | POA: Diagnosis not present

## 2015-10-22 DIAGNOSIS — R260 Ataxic gait: Secondary | ICD-10-CM | POA: Diagnosis not present

## 2015-10-22 DIAGNOSIS — R2681 Unsteadiness on feet: Secondary | ICD-10-CM | POA: Diagnosis not present

## 2015-10-22 DIAGNOSIS — M6281 Muscle weakness (generalized): Secondary | ICD-10-CM | POA: Diagnosis not present

## 2015-10-24 DIAGNOSIS — M6281 Muscle weakness (generalized): Secondary | ICD-10-CM | POA: Diagnosis not present

## 2015-10-24 DIAGNOSIS — R2681 Unsteadiness on feet: Secondary | ICD-10-CM | POA: Diagnosis not present

## 2015-10-24 DIAGNOSIS — R260 Ataxic gait: Secondary | ICD-10-CM | POA: Diagnosis not present

## 2015-10-25 DIAGNOSIS — M6281 Muscle weakness (generalized): Secondary | ICD-10-CM | POA: Diagnosis not present

## 2015-10-25 DIAGNOSIS — R260 Ataxic gait: Secondary | ICD-10-CM | POA: Diagnosis not present

## 2015-10-25 DIAGNOSIS — R2681 Unsteadiness on feet: Secondary | ICD-10-CM | POA: Diagnosis not present

## 2015-10-29 DIAGNOSIS — R260 Ataxic gait: Secondary | ICD-10-CM | POA: Diagnosis not present

## 2015-10-29 DIAGNOSIS — R2681 Unsteadiness on feet: Secondary | ICD-10-CM | POA: Diagnosis not present

## 2015-10-29 DIAGNOSIS — M6281 Muscle weakness (generalized): Secondary | ICD-10-CM | POA: Diagnosis not present

## 2015-10-31 DIAGNOSIS — R2681 Unsteadiness on feet: Secondary | ICD-10-CM | POA: Diagnosis not present

## 2015-10-31 DIAGNOSIS — M6281 Muscle weakness (generalized): Secondary | ICD-10-CM | POA: Diagnosis not present

## 2015-10-31 DIAGNOSIS — R260 Ataxic gait: Secondary | ICD-10-CM | POA: Diagnosis not present

## 2015-11-01 DIAGNOSIS — M6281 Muscle weakness (generalized): Secondary | ICD-10-CM | POA: Diagnosis not present

## 2015-11-01 DIAGNOSIS — R2681 Unsteadiness on feet: Secondary | ICD-10-CM | POA: Diagnosis not present

## 2015-11-01 DIAGNOSIS — R260 Ataxic gait: Secondary | ICD-10-CM | POA: Diagnosis not present

## 2015-11-05 DIAGNOSIS — R260 Ataxic gait: Secondary | ICD-10-CM | POA: Diagnosis not present

## 2015-11-05 DIAGNOSIS — R278 Other lack of coordination: Secondary | ICD-10-CM | POA: Diagnosis not present

## 2015-11-05 DIAGNOSIS — N393 Stress incontinence (female) (male): Secondary | ICD-10-CM | POA: Diagnosis not present

## 2015-11-05 DIAGNOSIS — M6281 Muscle weakness (generalized): Secondary | ICD-10-CM | POA: Diagnosis not present

## 2015-11-05 DIAGNOSIS — H542 Low vision, both eyes: Secondary | ICD-10-CM | POA: Diagnosis not present

## 2015-11-05 DIAGNOSIS — R2681 Unsteadiness on feet: Secondary | ICD-10-CM | POA: Diagnosis not present

## 2015-11-07 DIAGNOSIS — R2681 Unsteadiness on feet: Secondary | ICD-10-CM | POA: Diagnosis not present

## 2015-11-07 DIAGNOSIS — R278 Other lack of coordination: Secondary | ICD-10-CM | POA: Diagnosis not present

## 2015-11-07 DIAGNOSIS — R260 Ataxic gait: Secondary | ICD-10-CM | POA: Diagnosis not present

## 2015-11-07 DIAGNOSIS — M6281 Muscle weakness (generalized): Secondary | ICD-10-CM | POA: Diagnosis not present

## 2015-11-07 DIAGNOSIS — N393 Stress incontinence (female) (male): Secondary | ICD-10-CM | POA: Diagnosis not present

## 2015-11-07 DIAGNOSIS — H542 Low vision, both eyes: Secondary | ICD-10-CM | POA: Diagnosis not present

## 2015-11-09 DIAGNOSIS — H542 Low vision, both eyes: Secondary | ICD-10-CM | POA: Diagnosis not present

## 2015-11-09 DIAGNOSIS — N393 Stress incontinence (female) (male): Secondary | ICD-10-CM | POA: Diagnosis not present

## 2015-11-09 DIAGNOSIS — M6281 Muscle weakness (generalized): Secondary | ICD-10-CM | POA: Diagnosis not present

## 2015-11-09 DIAGNOSIS — R260 Ataxic gait: Secondary | ICD-10-CM | POA: Diagnosis not present

## 2015-11-09 DIAGNOSIS — R278 Other lack of coordination: Secondary | ICD-10-CM | POA: Diagnosis not present

## 2015-11-09 DIAGNOSIS — R2681 Unsteadiness on feet: Secondary | ICD-10-CM | POA: Diagnosis not present

## 2015-11-12 DIAGNOSIS — M6281 Muscle weakness (generalized): Secondary | ICD-10-CM | POA: Diagnosis not present

## 2015-11-12 DIAGNOSIS — N393 Stress incontinence (female) (male): Secondary | ICD-10-CM | POA: Diagnosis not present

## 2015-11-12 DIAGNOSIS — R278 Other lack of coordination: Secondary | ICD-10-CM | POA: Diagnosis not present

## 2015-11-12 DIAGNOSIS — H542 Low vision, both eyes: Secondary | ICD-10-CM | POA: Diagnosis not present

## 2015-11-12 DIAGNOSIS — R260 Ataxic gait: Secondary | ICD-10-CM | POA: Diagnosis not present

## 2015-11-12 DIAGNOSIS — R2681 Unsteadiness on feet: Secondary | ICD-10-CM | POA: Diagnosis not present

## 2015-11-13 DIAGNOSIS — R2681 Unsteadiness on feet: Secondary | ICD-10-CM | POA: Diagnosis not present

## 2015-11-13 DIAGNOSIS — H542 Low vision, both eyes: Secondary | ICD-10-CM | POA: Diagnosis not present

## 2015-11-13 DIAGNOSIS — N393 Stress incontinence (female) (male): Secondary | ICD-10-CM | POA: Diagnosis not present

## 2015-11-13 DIAGNOSIS — M6281 Muscle weakness (generalized): Secondary | ICD-10-CM | POA: Diagnosis not present

## 2015-11-13 DIAGNOSIS — R260 Ataxic gait: Secondary | ICD-10-CM | POA: Diagnosis not present

## 2015-11-13 DIAGNOSIS — R278 Other lack of coordination: Secondary | ICD-10-CM | POA: Diagnosis not present

## 2015-11-14 DIAGNOSIS — R2681 Unsteadiness on feet: Secondary | ICD-10-CM | POA: Diagnosis not present

## 2015-11-14 DIAGNOSIS — H542 Low vision, both eyes: Secondary | ICD-10-CM | POA: Diagnosis not present

## 2015-11-14 DIAGNOSIS — R260 Ataxic gait: Secondary | ICD-10-CM | POA: Diagnosis not present

## 2015-11-14 DIAGNOSIS — R278 Other lack of coordination: Secondary | ICD-10-CM | POA: Diagnosis not present

## 2015-11-14 DIAGNOSIS — N393 Stress incontinence (female) (male): Secondary | ICD-10-CM | POA: Diagnosis not present

## 2015-11-14 DIAGNOSIS — M6281 Muscle weakness (generalized): Secondary | ICD-10-CM | POA: Diagnosis not present

## 2015-11-19 DIAGNOSIS — M6281 Muscle weakness (generalized): Secondary | ICD-10-CM | POA: Diagnosis not present

## 2015-11-19 DIAGNOSIS — R260 Ataxic gait: Secondary | ICD-10-CM | POA: Diagnosis not present

## 2015-11-19 DIAGNOSIS — R278 Other lack of coordination: Secondary | ICD-10-CM | POA: Diagnosis not present

## 2015-11-19 DIAGNOSIS — R2681 Unsteadiness on feet: Secondary | ICD-10-CM | POA: Diagnosis not present

## 2015-11-19 DIAGNOSIS — N393 Stress incontinence (female) (male): Secondary | ICD-10-CM | POA: Diagnosis not present

## 2015-11-19 DIAGNOSIS — H542 Low vision, both eyes: Secondary | ICD-10-CM | POA: Diagnosis not present

## 2015-11-20 DIAGNOSIS — R2681 Unsteadiness on feet: Secondary | ICD-10-CM | POA: Diagnosis not present

## 2015-11-20 DIAGNOSIS — R278 Other lack of coordination: Secondary | ICD-10-CM | POA: Diagnosis not present

## 2015-11-20 DIAGNOSIS — N393 Stress incontinence (female) (male): Secondary | ICD-10-CM | POA: Diagnosis not present

## 2015-11-20 DIAGNOSIS — R260 Ataxic gait: Secondary | ICD-10-CM | POA: Diagnosis not present

## 2015-11-20 DIAGNOSIS — M6281 Muscle weakness (generalized): Secondary | ICD-10-CM | POA: Diagnosis not present

## 2015-11-20 DIAGNOSIS — H542 Low vision, both eyes: Secondary | ICD-10-CM | POA: Diagnosis not present

## 2015-11-21 DIAGNOSIS — N393 Stress incontinence (female) (male): Secondary | ICD-10-CM | POA: Diagnosis not present

## 2015-11-21 DIAGNOSIS — H542 Low vision, both eyes: Secondary | ICD-10-CM | POA: Diagnosis not present

## 2015-11-21 DIAGNOSIS — R260 Ataxic gait: Secondary | ICD-10-CM | POA: Diagnosis not present

## 2015-11-21 DIAGNOSIS — M6281 Muscle weakness (generalized): Secondary | ICD-10-CM | POA: Diagnosis not present

## 2015-11-21 DIAGNOSIS — R2681 Unsteadiness on feet: Secondary | ICD-10-CM | POA: Diagnosis not present

## 2015-11-21 DIAGNOSIS — R278 Other lack of coordination: Secondary | ICD-10-CM | POA: Diagnosis not present

## 2015-11-22 ENCOUNTER — Encounter: Payer: Self-pay | Admitting: Cardiology

## 2015-11-22 ENCOUNTER — Ambulatory Visit (INDEPENDENT_AMBULATORY_CARE_PROVIDER_SITE_OTHER): Payer: Medicare Other | Admitting: Cardiology

## 2015-11-22 ENCOUNTER — Telehealth (HOSPITAL_COMMUNITY): Payer: Self-pay | Admitting: *Deleted

## 2015-11-22 VITALS — BP 132/62 | HR 72 | Ht 65.0 in | Wt 177.0 lb

## 2015-11-22 DIAGNOSIS — I2583 Coronary atherosclerosis due to lipid rich plaque: Secondary | ICD-10-CM | POA: Diagnosis not present

## 2015-11-22 DIAGNOSIS — I255 Ischemic cardiomyopathy: Secondary | ICD-10-CM | POA: Diagnosis not present

## 2015-11-22 DIAGNOSIS — I2510 Atherosclerotic heart disease of native coronary artery without angina pectoris: Secondary | ICD-10-CM

## 2015-11-22 DIAGNOSIS — I429 Cardiomyopathy, unspecified: Secondary | ICD-10-CM | POA: Diagnosis not present

## 2015-11-22 DIAGNOSIS — I5022 Chronic systolic (congestive) heart failure: Secondary | ICD-10-CM | POA: Diagnosis not present

## 2015-11-22 DIAGNOSIS — I10 Essential (primary) hypertension: Secondary | ICD-10-CM | POA: Diagnosis not present

## 2015-11-22 DIAGNOSIS — I272 Other secondary pulmonary hypertension: Secondary | ICD-10-CM | POA: Diagnosis not present

## 2015-11-22 DIAGNOSIS — I428 Other cardiomyopathies: Secondary | ICD-10-CM | POA: Diagnosis not present

## 2015-11-22 DIAGNOSIS — E785 Hyperlipidemia, unspecified: Secondary | ICD-10-CM | POA: Diagnosis not present

## 2015-11-22 DIAGNOSIS — R0602 Shortness of breath: Secondary | ICD-10-CM

## 2015-11-22 DIAGNOSIS — I509 Heart failure, unspecified: Secondary | ICD-10-CM | POA: Diagnosis not present

## 2015-11-22 LAB — CBC WITH DIFFERENTIAL/PLATELET
Basophils Absolute: 0 cells/uL (ref 0–200)
Basophils Relative: 0 %
Eosinophils Absolute: 138 cells/uL (ref 15–500)
Eosinophils Relative: 2 %
HCT: 33.8 % — ABNORMAL LOW (ref 35.0–45.0)
Hemoglobin: 10.9 g/dL — ABNORMAL LOW (ref 11.7–15.5)
Lymphocytes Relative: 22 %
Lymphs Abs: 1518 cells/uL (ref 850–3900)
MCH: 31.6 pg (ref 27.0–33.0)
MCHC: 32.2 g/dL (ref 32.0–36.0)
MCV: 98 fL (ref 80.0–100.0)
MPV: 9.4 fL (ref 7.5–12.5)
Monocytes Absolute: 759 cells/uL (ref 200–950)
Monocytes Relative: 11 %
Neutro Abs: 4485 cells/uL (ref 1500–7800)
Neutrophils Relative %: 65 %
Platelets: 201 10*3/uL (ref 140–400)
RBC: 3.45 MIL/uL — ABNORMAL LOW (ref 3.80–5.10)
RDW: 14 % (ref 11.0–15.0)
WBC: 6.9 10*3/uL (ref 3.8–10.8)

## 2015-11-22 LAB — BASIC METABOLIC PANEL
BUN: 28 mg/dL — ABNORMAL HIGH (ref 7–25)
CO2: 28 mmol/L (ref 20–31)
Calcium: 8.7 mg/dL (ref 8.6–10.4)
Chloride: 105 mmol/L (ref 98–110)
Creat: 1.08 mg/dL — ABNORMAL HIGH (ref 0.60–0.88)
Glucose, Bld: 84 mg/dL (ref 65–99)
Potassium: 5 mmol/L (ref 3.5–5.3)
Sodium: 141 mmol/L (ref 135–146)

## 2015-11-22 LAB — TSH: TSH: 2.72 mIU/L

## 2015-11-22 LAB — BRAIN NATRIURETIC PEPTIDE: Brain Natriuretic Peptide: 146.2 pg/mL — ABNORMAL HIGH (ref <100)

## 2015-11-22 NOTE — Progress Notes (Signed)
Cardiology Office Note    Date:  11/22/2015   ID:  [name], DOB 11/02/1922, MRN [id number]  PCP:  Simona Huh, MD  Cardiologist:  Sueanne Margarita, MD   Chief Complaint  Patient presents with  . Coronary Artery Disease  . Hypertension    History of Present Illness:  [name] is a 82 y.o. female with a history of ASCAD s/p DES to LAD with residual disease of the diag too small for PCI by cath 01/2010 and then repeat cath with restenosis of the LAF s/p PCI 10/2010), chronic stable angina, ischemic DCM ( EF 45% on echo 08/2015), LBBB, BiVPPM, HTN, dyslipidemia and chronic systolic CHF who presents today for followup. She is doing well. She denies any anginal chest pain, dizziness, palpitations or syncope. She has chronic LE edema which has gotten worse.  She does not use added salt in her diet.  . She has chronic DOE which she thinks has gotten worse. This seemed to get worse over the winter associated with wheezing and she was treated for a URI but still has continued to have SOB.  She denies any PND or orthopnea.       Past Medical History  Diagnosis Date  . Macular degeneration   . Mild mitral regurgitation   . Osteopenia   . Vitamin B12 deficiency   . Breast cancer ([place])     bilaterally  . Cardiomyopathy ([place])     Mixed CM; s/p St. Jude CRT-P implant 10/24 w/ LV lead revision 10/25 . Echo 08/2015 with EF 45%.    Marland Kitchen LBBB (left bundle branch block)   . Chronic systolic CHF (congestive heart failure) ([place])   . Hypertension   . Pacemaker   . GERD (gastroesophageal reflux disease)     occ  . Umbilical hernia     Repaired on 01/10/2013   . Hematuria 02/23/2013    w/clots required hospital admission on 02/23/2013  . Osteoarthritis     "right knee" (02/23/2013)  . CAD (coronary artery disease)     w/ DES to LAD with residual diagonal disease to small for PCI 6/11 and repeat DES to LAD for restenosis 3/12  . Pulmonary HTN ([place]) 08/2015    PASP 58mmHg on ech  .  Dyslipidemia, goal LDL below 70     Past Surgical History  Procedure Laterality Date  . Coronary angioplasty with stent placement  2010    "1" (05/27/2012)  . Total hip arthroplasty  2011    right  . Mastectomy  1995    bilaterally  . Breast biopsy  1995    bilaterally  . Cataract extraction w/ intraocular lens  implant, bilateral  1990's  . Joint replacement    . Tonsillectomy    . Umbilical hernia repair N/A 01/10/2013    Procedure: HERNIA REPAIR UMBILICAL ADULT;  Surgeon: Haywood Lasso, MD;  Location: [place];  Service: General;  Laterality: N/A;  . Insertion of mesh N/A 01/10/2013    Procedure: INSERTION OF MESH;  Surgeon: Haywood Lasso, MD;  Location: [place];  Service: General;  Laterality: N/A;  . Hernia repair    . Insert / replace / remove pacemaker  05/27/2012    initial placement - BiV PPM  . Insert / replace / remove pacemaker  05/31/2012    "lead change/repaired" (05/31/2012)  . Bi-ventricular pacemaker insertion N/A 05/27/2012    Procedure: BI-VENTRICULAR PACEMAKER INSERTION (CRT-P);  Surgeon: Deboraha Sprang, MD;  Location: [facility] CATH  LAB;  Service: Cardiovascular;  Laterality: N/A;  . Lead revision N/A 05/28/2012    Procedure: LEAD REVISION;  Surgeon: Evans Lance, MD;  Location: [facility] CATH LAB;  Service: Cardiovascular;  Laterality: N/A;  . Lead revision Left 05/31/2012    Procedure: LEAD REVISION;  Surgeon: Deboraha Sprang, MD;  Location: [facility] CATH LAB;  Service: Cardiovascular;  Laterality: Left;    Current Medications: Outpatient Prescriptions Prior to Visit  Medication Sig Dispense Refill  . aspirin EC 81 MG tablet Take 81 mg by mouth daily.    . Cholecalciferol (VITAMIN D-3) 5000 UNITS TABS Take 5,000 Units by mouth daily.     . furosemide (LASIX) 40 MG tablet Take 1 tablet (40 mg total) by mouth every other day. Take 40 mg by mouth every other day alternating with 20 mg by mouth every other day. 30 tablet 6  . isosorbide mononitrate (IMDUR) 60 MG 24 hr tablet  TAKE ONE AND ONE-HALF TABLETS  BY MOUTH DAILY    . nitroGLYCERIN (NITROSTAT) 0.4 MG SL tablet Place 0.4 mg under the tongue every 5 (five) minutes as needed for chest pain.     . potassium chloride SA (K-DUR,KLOR-CON) 20 MEQ tablet Take 2 tablets (40 mEq total) by mouth daily. 60 tablet 6  . ramipril (ALTACE) 5 MG capsule Take 1 capsule (5 mg total) by mouth daily. 30 capsule 6  . simvastatin (ZOCOR) 20 MG tablet Take 1 tablet (20 mg total) by mouth daily at 6 PM. 90 tablet 2  . traMADol (ULTRAM) 50 MG tablet Take 100 mg by mouth as directed. Take two tablets in the morning, and if needed, two more in the evening for pain.    . carvedilol (COREG) 6.25 MG tablet TAKE ONE TABLET TWICE DAILY (Patient not taking: Reported on 11/22/2015) 180 tablet 3  . clopidogrel (PLAVIX) 75 MG tablet TAKE ONE TABLET EACH DAY (Patient not taking: Reported on 11/22/2015) 30 tablet 3   No facility-administered medications prior to visit.     Allergies:   Review of patient's allergies indicates no known allergies.   Social History   Social History  . Marital Status: Married    Spouse Name: N/A  . Number of Children: N/A  . Years of Education: N/A   Social History Main Topics  . Smoking status: Former Smoker -- 1.00 packs/day for 27 years    Types: Cigarettes    Quit date: 01/05/1963  . Smokeless tobacco: Never Used     Comment: 05/27/2012 "stopped smoking ~ 46 yr ago"  . Alcohol Use: 3.0 oz/week    5 Glasses of wine per week     Comment: 02/23/2013 "glass of wine ~ 5X/wk":  . Drug Use: No  . Sexual Activity: Not Currently   Other Topics Concern  . None   Social History Narrative     Family History:  The patient's family history includes Heart disease in her father and mother.   ROS:   Please see the history of present illness.    Review of Systems  Constitution: Negative.  HENT: Negative.   Eyes: Negative.   Cardiovascular: Negative.   Respiratory: Negative.   Skin: Negative.     Musculoskeletal: Negative.   Gastrointestinal: Negative.   Genitourinary: Negative.   Neurological: Negative.   Psychiatric/Behavioral: Negative.    All other systems reviewed and are negative.   PHYSICAL EXAM:   VS:  BP 132/62 mmHg  Pulse 72  Ht 5\' 5"  (1.651 m)  Wt 177 lb (  80.287 kg)  BMI 29.45 kg/m2   GEN: Well nourished, well developed, in no acute distress HEENT: normal Neck: no JVD, carotid bruits, or masses Cardiac: RRR; no murmurs, rubs, or gallops.  1+ edema. Respiratory:  clear to auscultation bilaterally, normal work of breathing GI: soft, nontender, nondistended, + BS MS: no deformity or atrophy Skin: warm and dry, no rash Neuro:  Alert and Oriented x 3, Strength and sensation are intact Psych: euthymic mood, full affect  Wt Readings from Last 3 Encounters:  11/22/15 177 lb (80.287 kg)  08/23/15 175 lb (79.379 kg)  05/15/15 181 lb (82.101 kg)      Studies/Labs Reviewed:   EKG:  EKG is not ordered today.   Recent Labs: 05/25/2015: ALT 16; BUN 24; Creat 1.12*; Potassium 3.2*; Sodium 142   Lipid Panel    Component Value Date/Time   CHOL 98* 05/25/2015 1043   TRIG 67 05/25/2015 1043   HDL 56 05/25/2015 1043   CHOLHDL 1.8 05/25/2015 1043   VLDL 13 05/25/2015 1043   LDLCALC 29 05/25/2015 1043    Additional studies/ records that were reviewed today include:  none    ASSESSMENT:    1. Chronic systolic congestive heart failure ([place])   2. Cardiomyopathy ([place])   3. Coronary artery disease due to lipid rich plaque   4. Benign essential HTN   5. Dyslipidemia   6. Pulmonary HTN ([place])   7. SOB (shortness of breath)      PLAN:  In order of problems listed above:  1. Chronic systolic CHF- she appears euvolemic on exam today except for LE edema.  I will check a BNP due to increased SOB.  Continue BB/ACE I/diuretic.   2. Ischemic DCM - EF 45% on prior echo 08/2015.   3. ASCAD - no CP but has increased SOB.  Continue ASA/Plavix/BB/long acting  nitrate/statin.  Increased DOE so will check stress test.  4. HTN - BP well controlled.  Continue BB/ACE I.  Check BMET. 5. Hyperlipidemia - LDL goal < 70.  Continue statin.  Check FLP and ALT in 2 months. 6. Moderate pulmonary HTN - PASP 35mmHg.  Repeat echo in 1 year 7. SOB - I will get a BNP, TSH, CBC and BMET today.  Her lungs are clear and the SOB is only with exertion so need to consider ischemia.  Will check Lexiscan myoview.     Medication Adjustments/Labs and Tests Ordered: Current medicines are reviewed at length with the patient today.  Concerns regarding medicines are outlined above.  Medication changes, Labs and Tests ordered today are listed in the Patient Instructions below. Patient Instructions  Medication Instructions:  Your physician recommends that you continue on your current medications as directed. Please refer to the Current Medication list given to you today.   Labwork: TODAY: BMET, CBC, TSH, BNP  Your physician recommends that you return for FASTING lab work SAME DAY AS STRESS TEST.   Testing/Procedures: Your physician has requested that you have an echocardiogram in February, 2018. Echocardiography is a painless test that uses sound waves to create images of your heart. It provides your doctor with information about the size and shape of your heart and how well your heart's chambers and valves are working. This procedure takes approximately one hour. There are no restrictions for this procedure.   Your physician has requested that you have a lexiscan myoview. For further information please visit [email or web address]. Please follow instruction sheet, as given.  Follow-Up: Your physician wants you to follow-up  in: 6 months with Dr. Radford Pax. You will receive a reminder letter in the mail two months in advance. If you don't receive a letter, please call our office to schedule the follow-up appointment.   Any Other Special Instructions Will Be Listed Below (If  Applicable).     If you need a refill on your cardiac medications before your next appointment, please call your pharmacy.       Lurena Nida, MD  11/22/2015 12:19 PM    [facility] [place], [place], [place]  [address] Phone: [phone]; Fax: [phone]

## 2015-11-22 NOTE — Patient Instructions (Signed)
Medication Instructions:  Your physician recommends that you continue on your current medications as directed. Please refer to the Current Medication list given to you today.   Labwork: TODAY: BMET, CBC, TSH, BNP  Your physician recommends that you return for FASTING lab work SAME DAY AS STRESS TEST.   Testing/Procedures: Your physician has requested that you have an echocardiogram in February, 2018. Echocardiography is a painless test that uses sound waves to create images of your heart. It provides your doctor with information about the size and shape of your heart and how well your heart's chambers and valves are working. This procedure takes approximately one hour. There are no restrictions for this procedure.   Your physician has requested that you have a lexiscan myoview. For further information please visit [email or web address]. Please follow instruction sheet, as given.  Follow-Up: Your physician wants you to follow-up in: 6 months with Dr. Radford Pax. You will receive a reminder letter in the mail two months in advance. If you don't receive a letter, please call our office to schedule the follow-up appointment.   Any Other Special Instructions Will Be Listed Below (If Applicable).     If you need a refill on your cardiac medications before your next appointment, please call your pharmacy.

## 2015-11-22 NOTE — Telephone Encounter (Signed)
Patient given detailed instructions per Myocardial Perfusion Study Information Sheet for the test on 11/27/15 at 1000. Patient notified to arrive 15 minutes early and that it is imperative to arrive on time for appointment to keep from having the test rescheduled.  If you need to cancel or reschedule your appointment, please call the office within 24 hours of your appointment. Failure to do so may result in a cancellation of your appointment, and a $50 no show fee. Patient verbalized understanding.Agustin Swatek, Ranae [place]

## 2015-11-23 ENCOUNTER — Telehealth: Payer: Self-pay

## 2015-11-23 DIAGNOSIS — R2681 Unsteadiness on feet: Secondary | ICD-10-CM | POA: Diagnosis not present

## 2015-11-23 DIAGNOSIS — I5022 Chronic systolic (congestive) heart failure: Secondary | ICD-10-CM

## 2015-11-23 DIAGNOSIS — R260 Ataxic gait: Secondary | ICD-10-CM | POA: Diagnosis not present

## 2015-11-23 DIAGNOSIS — H542 Low vision, both eyes: Secondary | ICD-10-CM | POA: Diagnosis not present

## 2015-11-23 DIAGNOSIS — M6281 Muscle weakness (generalized): Secondary | ICD-10-CM | POA: Diagnosis not present

## 2015-11-23 DIAGNOSIS — R278 Other lack of coordination: Secondary | ICD-10-CM | POA: Diagnosis not present

## 2015-11-23 DIAGNOSIS — I10 Essential (primary) hypertension: Secondary | ICD-10-CM

## 2015-11-23 DIAGNOSIS — N393 Stress incontinence (female) (male): Secondary | ICD-10-CM | POA: Diagnosis not present

## 2015-11-23 MED ORDER — POTASSIUM CHLORIDE CRYS ER 20 MEQ PO TBCR: 20.0000 meq | EXTENDED_RELEASE_TABLET | Freq: Every day | ORAL | Status: DC

## 2015-11-23 NOTE — Telephone Encounter (Signed)
Informed patient of results and verbal understanding expressed.  Instructed patient to DECREASE POTASSIUM to 20 meq daily. BMET added to labwork next week. Patient agrees with treatment plan.

## 2015-11-23 NOTE — Telephone Encounter (Signed)
-----   Message from Sueanne Margarita, MD sent at 11/22/2015  5:52 PM EDT ----- Stable BMET - decrease Kdur to 75meq daily and recheck BMET in 1 week

## 2015-11-26 DIAGNOSIS — M6281 Muscle weakness (generalized): Secondary | ICD-10-CM | POA: Diagnosis not present

## 2015-11-26 DIAGNOSIS — R2681 Unsteadiness on feet: Secondary | ICD-10-CM | POA: Diagnosis not present

## 2015-11-26 DIAGNOSIS — R260 Ataxic gait: Secondary | ICD-10-CM | POA: Diagnosis not present

## 2015-11-26 DIAGNOSIS — H542 Low vision, both eyes: Secondary | ICD-10-CM | POA: Diagnosis not present

## 2015-11-26 DIAGNOSIS — R278 Other lack of coordination: Secondary | ICD-10-CM | POA: Diagnosis not present

## 2015-11-26 DIAGNOSIS — N393 Stress incontinence (female) (male): Secondary | ICD-10-CM | POA: Diagnosis not present

## 2015-11-27 ENCOUNTER — Other Ambulatory Visit (INDEPENDENT_AMBULATORY_CARE_PROVIDER_SITE_OTHER): Payer: Medicare Other | Admitting: *Deleted

## 2015-11-27 ENCOUNTER — Ambulatory Visit (HOSPITAL_COMMUNITY): Payer: Medicare Other | Attending: Cardiology

## 2015-11-27 DIAGNOSIS — R0602 Shortness of breath: Secondary | ICD-10-CM | POA: Insufficient documentation

## 2015-11-27 DIAGNOSIS — I2583 Coronary atherosclerosis due to lipid rich plaque: Secondary | ICD-10-CM

## 2015-11-27 DIAGNOSIS — I10 Essential (primary) hypertension: Secondary | ICD-10-CM | POA: Diagnosis not present

## 2015-11-27 DIAGNOSIS — I509 Heart failure, unspecified: Secondary | ICD-10-CM | POA: Diagnosis not present

## 2015-11-27 DIAGNOSIS — I5022 Chronic systolic (congestive) heart failure: Secondary | ICD-10-CM

## 2015-11-27 DIAGNOSIS — R0609 Other forms of dyspnea: Secondary | ICD-10-CM | POA: Diagnosis not present

## 2015-11-27 DIAGNOSIS — I429 Cardiomyopathy, unspecified: Secondary | ICD-10-CM | POA: Diagnosis not present

## 2015-11-27 DIAGNOSIS — E785 Hyperlipidemia, unspecified: Secondary | ICD-10-CM | POA: Diagnosis not present

## 2015-11-27 DIAGNOSIS — I2510 Atherosclerotic heart disease of native coronary artery without angina pectoris: Secondary | ICD-10-CM | POA: Diagnosis not present

## 2015-11-27 DIAGNOSIS — I447 Left bundle-branch block, unspecified: Secondary | ICD-10-CM | POA: Insufficient documentation

## 2015-11-27 DIAGNOSIS — I428 Other cardiomyopathies: Secondary | ICD-10-CM | POA: Diagnosis not present

## 2015-11-27 DIAGNOSIS — I272 Other secondary pulmonary hypertension: Secondary | ICD-10-CM | POA: Diagnosis not present

## 2015-11-27 LAB — BASIC METABOLIC PANEL
BUN: 26 mg/dL — ABNORMAL HIGH (ref 7–25)
CO2: 30 mmol/L (ref 20–31)
Calcium: 8.6 mg/dL (ref 8.6–10.4)
Chloride: 106 mmol/L (ref 98–110)
Creat: 1.03 mg/dL — ABNORMAL HIGH (ref 0.60–0.88)
Glucose, Bld: 83 mg/dL (ref 65–99)
Potassium: 5.1 mmol/L (ref 3.5–5.3)
Sodium: 143 mmol/L (ref 135–146)

## 2015-11-27 LAB — LIPID PANEL
Cholesterol: 130 mg/dL (ref 125–200)
HDL: 70 mg/dL (ref >=46)
LDL Cholesterol: 49 mg/dL (ref <130)
Total CHOL/HDL Ratio: 1.9 Ratio (ref <=5.0)
Triglycerides: 57 mg/dL (ref <150)
VLDL: 11 mg/dL (ref <30)

## 2015-11-27 LAB — MYOCARDIAL PERFUSION IMAGING
LV dias vol: 83 mL (ref 46–106)
LV sys vol: 30 mL
Peak HR: 90 {beats}/min
RATE: 0.24
Rest HR: 73 {beats}/min
SDS: 1
SRS: 7
SSS: 8
TID: 0.72

## 2015-11-27 LAB — HEPATIC FUNCTION PANEL
ALT: 12 U/L (ref 6–29)
AST: 21 U/L (ref 10–35)
Albumin: 3.7 g/dL (ref 3.6–5.1)
Alkaline Phosphatase: 127 U/L (ref 33–130)
Bilirubin, Direct: 0.1 mg/dL (ref <=0.2)
Indirect Bilirubin: 0.5 mg/dL (ref 0.2–1.2)
Total Bilirubin: 0.6 mg/dL (ref 0.2–1.2)
Total Protein: 6 g/dL — ABNORMAL LOW (ref 6.1–8.1)

## 2015-11-27 MED ORDER — TECHNETIUM TC 99M SESTAMIBI GENERIC - CARDIOLITE
11.0000 | Freq: Once | INTRAVENOUS | Status: AC | PRN
  Administered 2015-11-27: 11 via INTRAVENOUS

## 2015-11-27 MED ORDER — TECHNETIUM TC 99M SESTAMIBI GENERIC - CARDIOLITE
32.3000 | Freq: Once | INTRAVENOUS | Status: AC | PRN
  Administered 2015-11-27: 32.3 via INTRAVENOUS

## 2015-11-27 MED ORDER — REGADENOSON 0.4 MG/5ML IV SOLN
0.4000 mg | Freq: Once | INTRAVENOUS | Status: AC
  Administered 2015-11-27: 0.4 mg via INTRAVENOUS

## 2015-11-28 DIAGNOSIS — R260 Ataxic gait: Secondary | ICD-10-CM | POA: Diagnosis not present

## 2015-11-28 DIAGNOSIS — H542 Low vision, both eyes: Secondary | ICD-10-CM | POA: Diagnosis not present

## 2015-11-28 DIAGNOSIS — R278 Other lack of coordination: Secondary | ICD-10-CM | POA: Diagnosis not present

## 2015-11-28 DIAGNOSIS — M6281 Muscle weakness (generalized): Secondary | ICD-10-CM | POA: Diagnosis not present

## 2015-11-28 DIAGNOSIS — N393 Stress incontinence (female) (male): Secondary | ICD-10-CM | POA: Diagnosis not present

## 2015-11-28 DIAGNOSIS — R2681 Unsteadiness on feet: Secondary | ICD-10-CM | POA: Diagnosis not present

## 2015-11-29 DIAGNOSIS — M6281 Muscle weakness (generalized): Secondary | ICD-10-CM | POA: Diagnosis not present

## 2015-11-29 DIAGNOSIS — R2681 Unsteadiness on feet: Secondary | ICD-10-CM | POA: Diagnosis not present

## 2015-11-29 DIAGNOSIS — N393 Stress incontinence (female) (male): Secondary | ICD-10-CM | POA: Diagnosis not present

## 2015-11-29 DIAGNOSIS — R260 Ataxic gait: Secondary | ICD-10-CM | POA: Diagnosis not present

## 2015-11-29 DIAGNOSIS — H542 Low vision, both eyes: Secondary | ICD-10-CM | POA: Diagnosis not present

## 2015-11-29 DIAGNOSIS — R278 Other lack of coordination: Secondary | ICD-10-CM | POA: Diagnosis not present

## 2015-11-30 DIAGNOSIS — E538 Deficiency of other specified B group vitamins: Secondary | ICD-10-CM | POA: Diagnosis not present

## 2015-12-04 ENCOUNTER — Other Ambulatory Visit: Payer: Self-pay | Admitting: Cardiology

## 2015-12-04 ENCOUNTER — Other Ambulatory Visit: Payer: Self-pay

## 2015-12-04 MED ORDER — CLOPIDOGREL BISULFATE 75 MG PO TABS: 75.0000 mg | ORAL_TABLET | Freq: Every day | ORAL | Status: DC

## 2015-12-05 DIAGNOSIS — M6281 Muscle weakness (generalized): Secondary | ICD-10-CM | POA: Diagnosis not present

## 2015-12-05 DIAGNOSIS — N393 Stress incontinence (female) (male): Secondary | ICD-10-CM | POA: Diagnosis not present

## 2015-12-05 DIAGNOSIS — R2681 Unsteadiness on feet: Secondary | ICD-10-CM | POA: Diagnosis not present

## 2015-12-05 DIAGNOSIS — R260 Ataxic gait: Secondary | ICD-10-CM | POA: Diagnosis not present

## 2015-12-05 DIAGNOSIS — H542 Low vision, both eyes: Secondary | ICD-10-CM | POA: Diagnosis not present

## 2015-12-05 DIAGNOSIS — R278 Other lack of coordination: Secondary | ICD-10-CM | POA: Diagnosis not present

## 2015-12-06 DIAGNOSIS — R260 Ataxic gait: Secondary | ICD-10-CM | POA: Diagnosis not present

## 2015-12-06 DIAGNOSIS — M6281 Muscle weakness (generalized): Secondary | ICD-10-CM | POA: Diagnosis not present

## 2015-12-06 DIAGNOSIS — H542 Low vision, both eyes: Secondary | ICD-10-CM | POA: Diagnosis not present

## 2015-12-06 DIAGNOSIS — R278 Other lack of coordination: Secondary | ICD-10-CM | POA: Diagnosis not present

## 2015-12-06 DIAGNOSIS — N393 Stress incontinence (female) (male): Secondary | ICD-10-CM | POA: Diagnosis not present

## 2015-12-06 DIAGNOSIS — R2681 Unsteadiness on feet: Secondary | ICD-10-CM | POA: Diagnosis not present

## 2015-12-07 DIAGNOSIS — M6281 Muscle weakness (generalized): Secondary | ICD-10-CM | POA: Diagnosis not present

## 2015-12-07 DIAGNOSIS — H542 Low vision, both eyes: Secondary | ICD-10-CM | POA: Diagnosis not present

## 2015-12-07 DIAGNOSIS — N393 Stress incontinence (female) (male): Secondary | ICD-10-CM | POA: Diagnosis not present

## 2015-12-07 DIAGNOSIS — R2681 Unsteadiness on feet: Secondary | ICD-10-CM | POA: Diagnosis not present

## 2015-12-07 DIAGNOSIS — R260 Ataxic gait: Secondary | ICD-10-CM | POA: Diagnosis not present

## 2015-12-07 DIAGNOSIS — R278 Other lack of coordination: Secondary | ICD-10-CM | POA: Diagnosis not present

## 2015-12-10 DIAGNOSIS — R278 Other lack of coordination: Secondary | ICD-10-CM | POA: Diagnosis not present

## 2015-12-10 DIAGNOSIS — N393 Stress incontinence (female) (male): Secondary | ICD-10-CM | POA: Diagnosis not present

## 2015-12-10 DIAGNOSIS — R260 Ataxic gait: Secondary | ICD-10-CM | POA: Diagnosis not present

## 2015-12-10 DIAGNOSIS — H542 Low vision, both eyes: Secondary | ICD-10-CM | POA: Diagnosis not present

## 2015-12-10 DIAGNOSIS — M6281 Muscle weakness (generalized): Secondary | ICD-10-CM | POA: Diagnosis not present

## 2015-12-10 DIAGNOSIS — R2681 Unsteadiness on feet: Secondary | ICD-10-CM | POA: Diagnosis not present

## 2015-12-12 DIAGNOSIS — N393 Stress incontinence (female) (male): Secondary | ICD-10-CM | POA: Diagnosis not present

## 2015-12-12 DIAGNOSIS — H542 Low vision, both eyes: Secondary | ICD-10-CM | POA: Diagnosis not present

## 2015-12-12 DIAGNOSIS — R278 Other lack of coordination: Secondary | ICD-10-CM | POA: Diagnosis not present

## 2015-12-12 DIAGNOSIS — R260 Ataxic gait: Secondary | ICD-10-CM | POA: Diagnosis not present

## 2015-12-12 DIAGNOSIS — R2681 Unsteadiness on feet: Secondary | ICD-10-CM | POA: Diagnosis not present

## 2015-12-12 DIAGNOSIS — M6281 Muscle weakness (generalized): Secondary | ICD-10-CM | POA: Diagnosis not present

## 2015-12-13 DIAGNOSIS — M6281 Muscle weakness (generalized): Secondary | ICD-10-CM | POA: Diagnosis not present

## 2015-12-13 DIAGNOSIS — R260 Ataxic gait: Secondary | ICD-10-CM | POA: Diagnosis not present

## 2015-12-13 DIAGNOSIS — H542 Low vision, both eyes: Secondary | ICD-10-CM | POA: Diagnosis not present

## 2015-12-13 DIAGNOSIS — R2681 Unsteadiness on feet: Secondary | ICD-10-CM | POA: Diagnosis not present

## 2015-12-13 DIAGNOSIS — R278 Other lack of coordination: Secondary | ICD-10-CM | POA: Diagnosis not present

## 2015-12-13 DIAGNOSIS — N393 Stress incontinence (female) (male): Secondary | ICD-10-CM | POA: Diagnosis not present

## 2015-12-17 DIAGNOSIS — H542 Low vision, both eyes: Secondary | ICD-10-CM | POA: Diagnosis not present

## 2015-12-17 DIAGNOSIS — R278 Other lack of coordination: Secondary | ICD-10-CM | POA: Diagnosis not present

## 2015-12-17 DIAGNOSIS — N393 Stress incontinence (female) (male): Secondary | ICD-10-CM | POA: Diagnosis not present

## 2015-12-17 DIAGNOSIS — M6281 Muscle weakness (generalized): Secondary | ICD-10-CM | POA: Diagnosis not present

## 2015-12-17 DIAGNOSIS — R260 Ataxic gait: Secondary | ICD-10-CM | POA: Diagnosis not present

## 2015-12-17 DIAGNOSIS — R2681 Unsteadiness on feet: Secondary | ICD-10-CM | POA: Diagnosis not present

## 2015-12-19 DIAGNOSIS — H542 Low vision, both eyes: Secondary | ICD-10-CM | POA: Diagnosis not present

## 2015-12-19 DIAGNOSIS — R278 Other lack of coordination: Secondary | ICD-10-CM | POA: Diagnosis not present

## 2015-12-19 DIAGNOSIS — N393 Stress incontinence (female) (male): Secondary | ICD-10-CM | POA: Diagnosis not present

## 2015-12-19 DIAGNOSIS — R260 Ataxic gait: Secondary | ICD-10-CM | POA: Diagnosis not present

## 2015-12-19 DIAGNOSIS — R2681 Unsteadiness on feet: Secondary | ICD-10-CM | POA: Diagnosis not present

## 2015-12-19 DIAGNOSIS — M6281 Muscle weakness (generalized): Secondary | ICD-10-CM | POA: Diagnosis not present

## 2015-12-24 DIAGNOSIS — E538 Deficiency of other specified B group vitamins: Secondary | ICD-10-CM | POA: Diagnosis not present

## 2015-12-24 DIAGNOSIS — B349 Viral infection, unspecified: Secondary | ICD-10-CM | POA: Diagnosis not present

## 2015-12-26 DIAGNOSIS — R2681 Unsteadiness on feet: Secondary | ICD-10-CM | POA: Diagnosis not present

## 2015-12-26 DIAGNOSIS — M6281 Muscle weakness (generalized): Secondary | ICD-10-CM | POA: Diagnosis not present

## 2015-12-26 DIAGNOSIS — H542 Low vision, both eyes: Secondary | ICD-10-CM | POA: Diagnosis not present

## 2015-12-26 DIAGNOSIS — N393 Stress incontinence (female) (male): Secondary | ICD-10-CM | POA: Diagnosis not present

## 2015-12-26 DIAGNOSIS — R260 Ataxic gait: Secondary | ICD-10-CM | POA: Diagnosis not present

## 2015-12-26 DIAGNOSIS — R278 Other lack of coordination: Secondary | ICD-10-CM | POA: Diagnosis not present

## 2016-01-25 DIAGNOSIS — E538 Deficiency of other specified B group vitamins: Secondary | ICD-10-CM | POA: Diagnosis not present

## 2016-02-20 ENCOUNTER — Encounter: Payer: Self-pay | Admitting: Internal Medicine

## 2016-02-20 ENCOUNTER — Ambulatory Visit: Payer: Medicare Other | Admitting: *Deleted

## 2016-02-20 DIAGNOSIS — Z950 Presence of cardiac pacemaker: Secondary | ICD-10-CM

## 2016-02-20 LAB — CUP PACEART INCLINIC DEVICE CHECK
Battery Remaining Longevity: 60 mo
Battery Voltage: 3 V
Brady Statistic AP VP Percent: 0.08 %
Brady Statistic AP VS Percent: 0 %
Brady Statistic AS VP Percent: 99.87 %
Brady Statistic AS VS Percent: 0.05 %
Brady Statistic RA Percent Paced: 0.08 %
Brady Statistic RV Percent Paced: 99.95 %
Date Time Interrogation Session: 20170719112937
Implantable Lead Implant Date: 20131024
Implantable Lead Implant Date: 20131024
Implantable Lead Implant Date: 20131028
Implantable Lead Location: 753858
Implantable Lead Location: 753859
Implantable Lead Location: 753860
Implantable Lead Model: 1944
Implantable Lead Model: 1948
Implantable Lead Model: 4194
Lead Channel Impedance Value: 323 Ohm
Lead Channel Impedance Value: 399 Ohm
Lead Channel Impedance Value: 437 Ohm
Lead Channel Impedance Value: 475 Ohm
Lead Channel Impedance Value: 475 Ohm
Lead Channel Impedance Value: 551 Ohm
Lead Channel Impedance Value: 589 Ohm
Lead Channel Impedance Value: 627 Ohm
Lead Channel Impedance Value: 703 Ohm
Lead Channel Pacing Threshold Amplitude: 0.5 V
Lead Channel Pacing Threshold Amplitude: 0.75 V
Lead Channel Pacing Threshold Amplitude: 1 V
Lead Channel Pacing Threshold Pulse Width: 0.4 ms
Lead Channel Pacing Threshold Pulse Width: 0.4 ms
Lead Channel Pacing Threshold Pulse Width: 0.4 ms
Lead Channel Sensing Intrinsic Amplitude: 2 mV
Lead Channel Sensing Intrinsic Amplitude: 2.125 mV
Lead Channel Sensing Intrinsic Amplitude: 5.375 mV
Lead Channel Sensing Intrinsic Amplitude: 6.25 mV
Lead Channel Setting Pacing Amplitude: 2 V
Lead Channel Setting Pacing Amplitude: 2 V
Lead Channel Setting Pacing Amplitude: 2.5 V
Lead Channel Setting Pacing Pulse Width: 0.4 ms
Lead Channel Setting Pacing Pulse Width: 0.4 ms
Lead Channel Setting Sensing Sensitivity: 2.8 mV

## 2016-02-20 NOTE — Progress Notes (Signed)
CRT-P device check in clinic. Normal device function. Thresholds, sensing, impedance consistent with previous measurements. Histograms appropriate for patient and level of activity. No mode switches or ventricular high rate episodes. Patient bi-ventricularly pacing 99% of the time. Device programmed with appropriate safety margins. Optivol frequently crosses reference line, pt with chronic SOB and LE edema. Estimated longevity 4.26yrs. ROV with SK in 13months.

## 2016-02-22 DIAGNOSIS — E538 Deficiency of other specified B group vitamins: Secondary | ICD-10-CM | POA: Diagnosis not present

## 2016-03-25 DIAGNOSIS — E538 Deficiency of other specified B group vitamins: Secondary | ICD-10-CM | POA: Diagnosis not present

## 2016-03-25 DIAGNOSIS — J9801 Acute bronchospasm: Secondary | ICD-10-CM | POA: Diagnosis not present

## 2016-04-22 DIAGNOSIS — E538 Deficiency of other specified B group vitamins: Secondary | ICD-10-CM | POA: Diagnosis not present

## 2016-04-22 DIAGNOSIS — Z23 Encounter for immunization: Secondary | ICD-10-CM | POA: Diagnosis not present

## 2016-04-25 ENCOUNTER — Other Ambulatory Visit: Payer: Self-pay | Admitting: Cardiology

## 2016-05-21 ENCOUNTER — Encounter: Payer: Self-pay | Admitting: Cardiology

## 2016-05-21 ENCOUNTER — Ambulatory Visit (INDEPENDENT_AMBULATORY_CARE_PROVIDER_SITE_OTHER): Payer: Medicare Other | Admitting: Cardiology

## 2016-05-21 VITALS — BP 124/66 | HR 74 | Ht 64.5 in | Wt 182.0 lb

## 2016-05-21 DIAGNOSIS — I10 Essential (primary) hypertension: Secondary | ICD-10-CM | POA: Diagnosis not present

## 2016-05-21 DIAGNOSIS — I5022 Chronic systolic (congestive) heart failure: Secondary | ICD-10-CM

## 2016-05-21 DIAGNOSIS — I255 Ischemic cardiomyopathy: Secondary | ICD-10-CM | POA: Diagnosis not present

## 2016-05-21 DIAGNOSIS — I2510 Atherosclerotic heart disease of native coronary artery without angina pectoris: Secondary | ICD-10-CM | POA: Diagnosis not present

## 2016-05-21 DIAGNOSIS — E785 Hyperlipidemia, unspecified: Secondary | ICD-10-CM

## 2016-05-21 DIAGNOSIS — I272 Pulmonary hypertension, unspecified: Secondary | ICD-10-CM

## 2016-05-21 LAB — BASIC METABOLIC PANEL
BUN: 31 mg/dL — ABNORMAL HIGH (ref 7–25)
CO2: 28 mmol/L (ref 20–31)
Calcium: 9 mg/dL (ref 8.6–10.4)
Chloride: 104 mmol/L (ref 98–110)
Creat: 1.41 mg/dL — ABNORMAL HIGH (ref 0.60–0.88)
Glucose, Bld: 80 mg/dL (ref 65–99)
Potassium: 4.7 mmol/L (ref 3.5–5.3)
Sodium: 139 mmol/L (ref 135–146)

## 2016-05-21 NOTE — Progress Notes (Signed)
Cardiology Office Note    Date:  05/21/2016   ID:  ONI ORE, DOB 06-Sep-1922, MRN [id number]  PCP:  Simona Huh, MD  Cardiologist:  Fransico Him, MD   Chief Complaint  Patient presents with  . Coronary Artery Disease  . Congestive Heart Failure  . Hypertension    History of Present Illness:  [name] is a 82 y.o. female with a history of ASCAD s/p DES to LAD with residual disease of the diag too small for PCI by cath 01/2010 and then repeat cath with restenosis of the LAF s/p PCI 10/2010), chronic stable angina, ischemic DCM ( EF 45% on echo 08/2015), LBBB, BiVPPM, HTN, dyslipidemia and chronic systolic CHF who presents today for followup. She is doing well. She denies any anginal chest pain, dizziness, palpitations or syncope. She has chronic DOE which is stable and she thinks may be improved.  She has chronic LE edema which is stable.  She does not use added salt in her diet. She denies any PND or orthopnea.      Past Medical History:  Diagnosis Date  . Breast cancer ([place])    bilaterally  . CAD (coronary artery disease)    w/ DES to LAD with residual diagonal disease to small for PCI 6/11 and repeat DES to LAD for restenosis 3/12  . Cardiomyopathy ([place])    Mixed CM; s/p St. Jude CRT-P implant 10/24 w/ LV lead revision 10/25 . Echo 08/2015 with EF 45%.    . Chronic systolic CHF (congestive heart failure) ([place])   . Dyslipidemia, goal LDL below 70   . GERD (gastroesophageal reflux disease)    occ  . Hematuria 02/23/2013   w/clots required hospital admission on 02/23/2013  . Hypertension   . LBBB (left bundle branch block)   . Macular degeneration   . Mild mitral regurgitation   . Osteoarthritis    "right knee" (02/23/2013)  . Osteopenia   . Pacemaker   . Pulmonary HTN 08/2015   PASP 72mmHg on ech  . Umbilical hernia    Repaired on 01/10/2013   . Vitamin B12 deficiency     Past Surgical History:  Procedure Laterality Date  . BI-VENTRICULAR PACEMAKER INSERTION  N/A 05/27/2012   Procedure: BI-VENTRICULAR PACEMAKER INSERTION (CRT-P);  Surgeon: Deboraha Sprang, MD;  Location: [facility] CATH LAB;  Service: Cardiovascular;  Laterality: N/A;  . BREAST BIOPSY  1995   bilaterally  . CATARACT EXTRACTION W/ INTRAOCULAR LENS  IMPLANT, BILATERAL  1990's  . CORONARY ANGIOPLASTY WITH STENT PLACEMENT  2010   "1" (05/27/2012)  . HERNIA REPAIR    . INSERT / REPLACE / REMOVE PACEMAKER  05/27/2012   initial placement - BiV PPM  . INSERT / REPLACE / REMOVE PACEMAKER  05/31/2012   "lead change/repaired" (05/31/2012)  . INSERTION OF MESH N/A 01/10/2013   Procedure: INSERTION OF MESH;  Surgeon: Haywood Lasso, MD;  Location: [place];  Service: General;  Laterality: N/A;  . JOINT REPLACEMENT    . LEAD REVISION N/A 05/28/2012   Procedure: LEAD REVISION;  Surgeon: Evans Lance, MD;  Location: [facility] CATH LAB;  Service: Cardiovascular;  Laterality: N/A;  . LEAD REVISION Left 05/31/2012   Procedure: LEAD REVISION;  Surgeon: Deboraha Sprang, MD;  Location: [facility] CATH LAB;  Service: Cardiovascular;  Laterality: Left;  Marland Kitchen MASTECTOMY  1995   bilaterally  . TONSILLECTOMY    . TOTAL HIP ARTHROPLASTY  2011   right  . UMBILICAL HERNIA REPAIR N/A  01/10/2013   Procedure: HERNIA REPAIR UMBILICAL ADULT;  Surgeon: Haywood Lasso, MD;  Location: [place];  Service: General;  Laterality: N/A;    Current Medications: Outpatient Medications Prior to Visit  Medication Sig Dispense Refill  . aspirin EC 81 MG tablet Take 81 mg by mouth daily.    . carvedilol (COREG) 6.25 MG tablet Take 6.25 mg by mouth 2 (two) times daily with a meal.    . Cholecalciferol (VITAMIN D-3) 5000 UNITS TABS Take 5,000 Units by mouth daily.     . clopidogrel (PLAVIX) 75 MG tablet Take 1 tablet (75 mg total) by mouth daily. 30 tablet 11  . furosemide (LASIX) 40 MG tablet Take 1 tablet (40 mg total) by mouth every other day. Take 40 mg by mouth every other day alternating with 20 mg by mouth every other day. 30 tablet 6  .  ibuprofen (ADVIL,MOTRIN) 200 MG tablet Take 200 mg by mouth every 6 (six) hours as needed.    . isosorbide mononitrate (IMDUR) 60 MG 24 hr tablet Take 1.5 tablets (90 mg total) by mouth daily. 135 tablet 1  . nitroGLYCERIN (NITROSTAT) 0.4 MG SL tablet Place 0.4 mg under the tongue every 5 (five) minutes as needed for chest pain.     . potassium chloride SA (K-DUR,KLOR-CON) 20 MEQ tablet Take 1 tablet (20 mEq total) by mouth daily. 30 tablet 11  . ramipril (ALTACE) 5 MG capsule Take 1 capsule (5 mg total) by mouth daily. 30 capsule 6  . simvastatin (ZOCOR) 20 MG tablet TAKE ONE TABLET BY MOUTH ONCE DAILY AT 6PM 90 tablet 1  . acetaminophen (TYLENOL) 500 MG tablet Take 500 mg by mouth every 6 (six) hours as needed.    . isosorbide mononitrate (IMDUR) 60 MG 24 hr tablet TAKE ONE AND ONE-HALF TABLETS  BY MOUTH DAILY    . traMADol (ULTRAM) 50 MG tablet Take 100 mg by mouth as directed. Reported on 02/20/2016     No facility-administered medications prior to visit.      Allergies:   Review of patient's allergies indicates no known allergies.   Social History   Social History  . Marital status: Married    Spouse name: N/A  . Number of children: N/A  . Years of education: N/A   Social History Main Topics  . Smoking status: Former Smoker    Packs/day: 1.00    Years: 27.00    Types: Cigarettes    Quit date: 01/05/1963  . Smokeless tobacco: Never Used     Comment: 05/27/2012 "stopped smoking ~ 46 yr ago"  . Alcohol use 3.0 oz/week    5 Glasses of wine per week     Comment: 02/23/2013 "glass of wine ~ 5X/wk":  . Drug use: No  . Sexual activity: Not Currently   Other Topics Concern  . None   Social History Narrative  . None     Family History:  The patient's family history includes Heart disease in her father and mother.   ROS:   Please see the history of present illness.    ROS All other systems reviewed and are negative.  No flowsheet data found.     PHYSICAL EXAM:   VS:  BP  124/66   Pulse 74   Ht 5' 4.5" (1.638 m)   Wt 182 lb (82.6 kg)   SpO2 93%   BMI 30.76 kg/m    GEN: Well nourished, well developed, in no acute distress  HEENT: normal  Neck:  no JVD, carotid bruits, or masses Cardiac: RRR; no murmurs, rubs, or gallops,no edema.  Intact distal pulses bilaterally.  Respiratory:  clear to auscultation bilaterally, normal work of breathing GI: soft, nontender, nondistended, + BS MS: no deformity or atrophy  Skin: warm and dry, no rash Neuro:  Alert and Oriented x 3, Strength and sensation are intact Psych: euthymic mood, full affect  Wt Readings from Last 3 Encounters:  05/21/16 182 lb (82.6 kg)  11/22/15 177 lb (80.3 kg)  08/23/15 175 lb (79.4 kg)      Studies/Labs Reviewed:   EKG:  EKG is ordered today NSR with V paced rhyhtm and sinus arrhythma  Recent Labs: 11/22/2015: Brain Natriuretic Peptide 146.2; Hemoglobin 10.9; Platelets 201; TSH 2.72 11/27/2015: ALT 12; BUN 26; Creat 1.03; Potassium 5.1; Sodium 143   Lipid Panel    Component Value Date/Time   CHOL 130 11/27/2015 1042   TRIG 57 11/27/2015 1042   HDL 70 11/27/2015 1042   CHOLHDL 1.9 11/27/2015 1042   VLDL 11 11/27/2015 1042   LDLCALC 49 11/27/2015 1042    Additional studies/ records that were reviewed today include:  none    ASSESSMENT:    1. Chronic systolic congestive heart failure ([place])   2. Ischemic cardiomyopathy   3. Coronary artery disease involving native coronary artery of native heart without angina pectoris   4. Benign essential HTN   5. Pulmonary HTN   6. Dyslipidemia      PLAN:  In order of problems listed above:  1. Chronic systolic CHF - appears euvolemic on exam.  Continue BB/ACE I and diuretic.  Check BMET today.  2. DCM EF 45% with chronic LBBB s/p BiVPPM 3. ASCAD s/p PCI of the LAD with no angina.  Continue ASA/BB/Plavix and long acting nitrates.  4. HTN - BP controlled on current meds.  Continue BB and ACE I. 5. Moderate pulmonary HTN with PASP  29mmHg on echo 08/2015.   6. Dyslipidemia - LDL goal is < 70.  Continue statin.  LDL at goal at 49.    Medication Adjustments/Labs and Tests Ordered: Current medicines are reviewed at length with the patient today.  Concerns regarding medicines are outlined above.  Medication changes, Labs and Tests ordered today are listed in the Patient Instructions below.  Patient Instructions  Medication Instructions:  Your physician recommends that you continue on your current medications as directed. Please refer to the Current Medication list given to you today.   Labwork: TODAY: BMET  Testing/Procedures: None  Follow-Up: Your physician wants you to follow-up in: 6 months with Dr. Radford Pax. You will receive a reminder letter in the mail two months in advance. If you don't receive a letter, please call our office to schedule the follow-up appointment.   Any Other Special Instructions Will Be Listed Below (If Applicable).     If you need a refill on your cardiac medications before your next appointment, please call your pharmacy.      Signed, Fransico Him, MD  05/21/2016 12:02 PM    [address], [place], [place]  [address] Phone: [phone]; Fax: [phone]

## 2016-05-21 NOTE — Patient Instructions (Signed)

## 2016-05-22 ENCOUNTER — Telehealth: Payer: Self-pay

## 2016-05-22 DIAGNOSIS — I5022 Chronic systolic (congestive) heart failure: Secondary | ICD-10-CM

## 2016-05-22 DIAGNOSIS — E538 Deficiency of other specified B group vitamins: Secondary | ICD-10-CM | POA: Diagnosis not present

## 2016-05-22 MED ORDER — FUROSEMIDE 20 MG PO TABS: 20.0000 mg | ORAL_TABLET | Freq: Every day | ORAL | 11 refills | Status: DC

## 2016-05-22 NOTE — Telephone Encounter (Signed)
Informed patient of results and verbal understanding expressed.  Instructed patient to DECREASE LASIX to 20 mg daily. BMET scheduled for Friday, October 27. Patient agrees with treatment plan.

## 2016-05-22 NOTE — Telephone Encounter (Signed)
-----   Message from Sueanne Margarita, MD sent at 05/21/2016  7:03 PM EDT ----- Creatinine is elevated - please have her decrease Lasix to 20mg  daily and repeat BMET in 1 week

## 2016-05-30 ENCOUNTER — Other Ambulatory Visit: Payer: Medicare Other | Admitting: *Deleted

## 2016-05-30 DIAGNOSIS — I5022 Chronic systolic (congestive) heart failure: Secondary | ICD-10-CM

## 2016-05-30 LAB — BASIC METABOLIC PANEL
BUN: 26 mg/dL — ABNORMAL HIGH (ref 7–25)
CO2: 27 mmol/L (ref 20–31)
Calcium: 8.5 mg/dL — ABNORMAL LOW (ref 8.6–10.4)
Chloride: 108 mmol/L (ref 98–110)
Creat: 1.11 mg/dL — ABNORMAL HIGH (ref 0.60–0.88)
Glucose, Bld: 90 mg/dL (ref 65–99)
Potassium: 4.3 mmol/L (ref 3.5–5.3)
Sodium: 143 mmol/L (ref 135–146)

## 2016-06-01 ENCOUNTER — Encounter: Payer: Self-pay | Admitting: Cardiology

## 2016-06-19 DIAGNOSIS — E538 Deficiency of other specified B group vitamins: Secondary | ICD-10-CM | POA: Diagnosis not present

## 2016-07-17 DIAGNOSIS — E538 Deficiency of other specified B group vitamins: Secondary | ICD-10-CM | POA: Diagnosis not present

## 2016-07-23 ENCOUNTER — Encounter: Payer: Self-pay | Admitting: Pulmonary Disease

## 2016-07-23 ENCOUNTER — Ambulatory Visit (INDEPENDENT_AMBULATORY_CARE_PROVIDER_SITE_OTHER): Payer: Medicare Other | Admitting: Pulmonary Disease

## 2016-07-23 ENCOUNTER — Other Ambulatory Visit: Payer: Medicare Other

## 2016-07-23 ENCOUNTER — Other Ambulatory Visit (INDEPENDENT_AMBULATORY_CARE_PROVIDER_SITE_OTHER): Payer: Medicare Other

## 2016-07-23 ENCOUNTER — Telehealth: Payer: Self-pay | Admitting: Pulmonary Disease

## 2016-07-23 VITALS — BP 126/76 | HR 71 | Ht 64.5 in | Wt 180.0 lb

## 2016-07-23 DIAGNOSIS — D649 Anemia, unspecified: Secondary | ICD-10-CM | POA: Diagnosis not present

## 2016-07-23 DIAGNOSIS — I255 Ischemic cardiomyopathy: Secondary | ICD-10-CM

## 2016-07-23 DIAGNOSIS — R0602 Shortness of breath: Secondary | ICD-10-CM

## 2016-07-23 LAB — CBC WITH DIFFERENTIAL/PLATELET
Basophils Absolute: 0 10*3/uL (ref 0.0–0.1)
Basophils Relative: 0.6 % (ref 0.0–3.0)
Eosinophils Absolute: 0.1 10*3/uL (ref 0.0–0.7)
Eosinophils Relative: 1.9 % (ref 0.0–5.0)
HCT: 31.4 % — ABNORMAL LOW (ref 36.0–46.0)
Hemoglobin: 10.6 g/dL — ABNORMAL LOW (ref 12.0–15.0)
Lymphocytes Relative: 21.7 % (ref 12.0–46.0)
Lymphs Abs: 1.5 10*3/uL (ref 0.7–4.0)
MCHC: 33.6 g/dL (ref 30.0–36.0)
MCV: 97.1 fl (ref 78.0–100.0)
Monocytes Absolute: 0.7 10*3/uL (ref 0.1–1.0)
Monocytes Relative: 9.6 % (ref 3.0–12.0)
Neutro Abs: 4.5 10*3/uL (ref 1.4–7.7)
Neutrophils Relative %: 66.2 % (ref 43.0–77.0)
Platelets: 223 10*3/uL (ref 150.0–400.0)
RBC: 3.23 Mil/uL — ABNORMAL LOW (ref 3.87–5.11)
RDW: 14.6 % (ref 11.5–15.5)
WBC: 6.8 10*3/uL (ref 4.0–10.5)

## 2016-07-23 MED ORDER — ALBUTEROL SULFATE HFA 108 (90 BASE) MCG/ACT IN AERS: 2.0000 | INHALATION_SPRAY | Freq: Four times a day (QID) | RESPIRATORY_TRACT | 6 refills | Status: DC | PRN

## 2016-07-23 MED ORDER — AEROCHAMBER MV MISC: 0 refills | Status: DC

## 2016-07-23 NOTE — Patient Instructions (Signed)
   You can use the Albuterol inhaler with the spacer as needed. You do not have to use it on a routine basis.  Call me if you feel your breathing is getting worse or you have any other questions before your next appointment.  I will see you back in 8 weeks or sooner if needed.  TESTS ORDERED: 1. Serum CBC w/ Differential

## 2016-07-23 NOTE — Telephone Encounter (Signed)
IMAGING CXR PA/LAT 07/11/15 (personally reviewed by me): No focal opacity or effusion appreciated. Implanted device with cardiac leads noted. Marked kyphosis with compression fractures on lateral view. Heart normal in size & mediastinum otherwise normal in contour.  CARDIAC NUCLEAR STRESS TEST (11/27/15): New defect at rest and with stress in basal inferior and mid inferior location. Myocardial perfusion normal with a low risk study. Nuclear stress EF 64%. LV ejection fraction 55-65%.  TTE (09/04/15): LV normal in size with EF 45%. Diffuse hypokinesis with septal lateral dyssynchrony. Grade 1 diastolic dysfunction. LA mildly dilated & RA normal in size. RV normal in size as well as function with pacer wire or catheter noted within right ventricle. No aortic stenosis or regurgitation. Aortic root normal in size. Mild mitral regurgitation without stenosis. Trivial pulmonic regurgitation. Trivial tricuspid regurgitation. No pericardial effusion.  LABS 05/30/16 BMP:  143/4.3/108/27/26/1.11/90/8.5  11/22/15 CBC:  6.9/10.9/33.8/201

## 2016-07-23 NOTE — Progress Notes (Signed)
Subjective:    Patient ID: [name], female    DOB: August 16, 1922, 82 y.o.   MRN: [id number]  HPI She reports she has had progressive problems with breathing over the last year. She reports she has noticed dyspnea on exertion. She especially notices it with walking up hills. She also has dyspnea simply bending over to put on her shoes. Denies any associated chest tightness, pain, or pressure. Reportedly she has some wheezing with her dyspnea as well. She does have some mild coughing with her dyspnea. No nocturnal awakenings with any dyspnea or coughing. She has had lower extremity edema and uses compression stockings. She denies any significant weight fluctuation. Denies any history of bronchitis. She does have a history of pneumonia as a small child. Denies any breathing problems or history of asthma as a child. Denies any allergies, sinus congestion, or sinus pressure. No reflux, dyspepsia, or morning brash water taste. Denies any melena, hematochezia, or hematuria. Does have near syncope occasionally usually immediately after getting up first thing in the morning. No syncopal episodes. Does have pain in her knees chronically. No joint swelling or erythema. She has been tried on different inhalers without symptomatic relief. Reports she has never used an inhaler on an as-needed basis but rather routinely every morning upon awakening.  Review of Systems No fever, chills, or sweats. No rashes or bruising. A pertinent 14 point review of systems is negative except as per the history of presenting illness.  Allergies  Allergen Reactions  . Celebrex [Celecoxib] Swelling and Cough    Reported by Dr. Andrew Au office on 07/22/2016.     Current Outpatient Prescriptions on File Prior to Visit  Medication Sig Dispense Refill  . aspirin EC 81 MG tablet Take 81 mg by mouth daily.    . carvedilol (COREG) 6.25 MG tablet Take 6.25 mg by mouth 2 (two) times daily with a meal.    . Cholecalciferol (VITAMIN  D-3) 5000 UNITS TABS Take 5,000 Units by mouth daily.     . clopidogrel (PLAVIX) 75 MG tablet Take 1 tablet (75 mg total) by mouth daily. 30 tablet 11  . furosemide (LASIX) 20 MG tablet Take 1 tablet (20 mg total) by mouth daily. 30 tablet 11  . ibuprofen (ADVIL,MOTRIN) 200 MG tablet Take 200 mg by mouth every 6 (six) hours as needed.    . isosorbide mononitrate (IMDUR) 60 MG 24 hr tablet Take 1.5 tablets (90 mg total) by mouth daily. 135 tablet 1  . nitroGLYCERIN (NITROSTAT) 0.4 MG SL tablet Place 0.4 mg under the tongue every 5 (five) minutes as needed for chest pain.     . potassium chloride SA (K-DUR,KLOR-CON) 20 MEQ tablet Take 1 tablet (20 mEq total) by mouth daily. 30 tablet 11  . ramipril (ALTACE) 5 MG capsule Take 1 capsule (5 mg total) by mouth daily. 30 capsule 6  . simvastatin (ZOCOR) 20 MG tablet TAKE ONE TABLET BY MOUTH ONCE DAILY AT 6PM 90 tablet 1   No current facility-administered medications on file prior to visit.     Past Medical History:  Diagnosis Date  . Breast cancer ([place])    bilaterally  . CAD (coronary artery disease)    w/ DES to LAD with residual diagonal disease to small for PCI 6/11 and repeat DES to LAD for restenosis 3/12  . Cardiomyopathy ([place])    Mixed CM; s/p St. Jude CRT-P implant 10/24 w/ LV lead revision 10/25 . Echo 08/2015 with EF 45%.    Marland Kitchen  Chronic systolic CHF (congestive heart failure) ([place])   . Dyslipidemia, goal LDL below 70   . GERD (gastroesophageal reflux disease)    occ  . Hematuria 02/23/2013   w/clots required hospital admission on 02/23/2013  . Hypertension   . LBBB (left bundle branch block)   . Macular degeneration   . Mild mitral regurgitation   . Osteoarthritis    "right knee" (02/23/2013)  . Osteopenia   . Pacemaker   . Pulmonary HTN 08/2015   PASP 64mmHg on ech  . Umbilical hernia    Repaired on 01/10/2013   . Vitamin B12 deficiency     Past Surgical History:  Procedure Laterality Date  . BI-VENTRICULAR PACEMAKER INSERTION  N/A 05/27/2012   Procedure: BI-VENTRICULAR PACEMAKER INSERTION (CRT-P);  Surgeon: Deboraha Sprang, MD;  Location: [facility] CATH LAB;  Service: Cardiovascular;  Laterality: N/A;  . BREAST BIOPSY  1995   bilaterally  . CATARACT EXTRACTION W/ INTRAOCULAR LENS  IMPLANT, BILATERAL  1990's  . CORONARY ANGIOPLASTY WITH STENT PLACEMENT  2010   "1" (05/27/2012)  . HERNIA REPAIR    . INSERT / REPLACE / REMOVE PACEMAKER  05/27/2012   initial placement - BiV PPM  . INSERT / REPLACE / REMOVE PACEMAKER  05/31/2012   "lead change/repaired" (05/31/2012)  . INSERTION OF MESH N/A 01/10/2013   Procedure: INSERTION OF MESH;  Surgeon: Haywood Lasso, MD;  Location: [place];  Service: General;  Laterality: N/A;  . JOINT REPLACEMENT    . LEAD REVISION N/A 05/28/2012   Procedure: LEAD REVISION;  Surgeon: Evans Lance, MD;  Location[facility] CATH LAB;  Service: Cardiovascular;  Laterality: N/A;  . LEAD REVISION Left 05/31/2012   Procedure: LEAD REVISION;  Surgeon: Deboraha Sprang, MD;  Location: [facility] CATH LAB;  Service: Cardiovascular;  Laterality: Left;  Marland Kitchen MASTECTOMY  1995   bilaterally  . TONSILLECTOMY    . TOTAL HIP ARTHROPLASTY  2011   right  . UMBILICAL HERNIA REPAIR N/A 01/10/2013   Procedure: HERNIA REPAIR UMBILICAL ADULT;  Surgeon: Haywood Lasso, MD;  Location: [facility] OR;  Service: General;  Laterality: N/A;    Family History  Problem Relation Age of Onset  . Heart disease Mother   . Breast cancer Mother   . Heart disease Father   . Breast cancer Maternal Aunt   . Breast cancer Daughter   . Breast cancer Maternal Aunt   . Asthma Grandchild     Social History   Social History  . Marital status: Married    Spouse name: N/A  . Number of children: N/A  . Years of education: N/A   Social History Main Topics  . Smoking status: Former Smoker    Packs/day: 1.00    Years: 20.00    Types: Cigarettes    Start date: 03/17/1943    Quit date: 01/05/1963  . Smokeless tobacco: Never Used  . Alcohol use 3.0  oz/week    5 Glasses of wine per week     Comment: 02/23/2013 "glass of wine ~ 5X/wk":  . Drug use: No  . Sexual activity: Not Currently   Other Topics Concern  . None   Social History Narrative   Originally from [place], [place]. Previously lived in [place], [place]. Graduated college with a business degree (BS in [employer]). Currently enjoys reading. Previously enjoyed sewing and knitting. Previously also enjoyed water skiing. Previously worked as a Network engineer. Husband was a Personnel officer. Also previously worked for the [employer] for  a year. 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 Doesn't have down pillows. No mold or asbestos exposure.       Objective:   Physical Exam BP 126/76 (BP Location: Left Arm, Cuff Size: Normal)   Pulse 71   Ht 5' 4.5" (1.638 m)   Wt 180 lb (81.6 kg)   SpO2 95%   BMI 30.42 kg/m  General:  Awake. Alert. No acute distress. Daughter with her today.  Integument:  Warm & dry. No rash on exposed skin.  Lymphatics:  No appreciated cervical or supraclavicular lymphadenoapthy. HEENT:  Moist mucus membranes. No oral ulcers. No scleral injection or icterus.  Cardiovascular:  Regular rate. Pitting lower extremity edema. Unable to appreciate JVD.  Pulmonary:  Slightly diminished breath sounds in bilateral lung bases. Normal work of breathing on room air. Otherwise clear to auscultation bilaterally. Abdomen: Soft. Normal bowel sounds. Nondistended. Grossly nontender. No hepatosplenomegaly. Musculoskeletal:  Normal bulk and tone. Hand grip strength 5/5 bilaterally. No joint deformity or effusion appreciated. Neurological:  CN 2-12 grossly in tact. No meningismus. Moving all 4 extremities equally. Symmetric brachioradialis deep tendon reflexes. Psychiatric:  Mood and affect congruent. Speech normal rhythm, rate & tone.   WALK TEST 07/23/16:  Walked 1 lap / Baseline Sat 95% on RA / Nadir Sat 93%  IMAGING CXR PA/LAT 07/11/15 (personally  reviewed by me): No focal opacity or effusion appreciated. Implanted device with cardiac leads noted. Marked kyphosis with compression fractures on lateral view. Heart normal in size & mediastinum otherwise normal in contour.  CARDIAC NUCLEAR STRESS TEST (11/27/15): New defect at rest and with stress in basal inferior and mid inferior location. Myocardial perfusion normal with a low risk study. Nuclear stress EF 64%. LV ejection fraction 55-65%.  TTE (09/04/15): LV normal in size with EF 45%. Diffuse hypokinesis with septal lateral dyssynchrony. Grade 1 diastolic dysfunction. LA mildly dilated & RA normal in size. RV normal in size as well as function with pacer wire or catheter noted within right ventricle. No aortic stenosis or regurgitation. Aortic root normal in size. Mild mitral regurgitation without stenosis. Trivial pulmonic regurgitation. Trivial tricuspid regurgitation. No pericardial effusion.  LABS 05/30/16 BMP:  143/4.3/108/27/26/1.11/90/8.5  11/22/15 CBC:  6.9/10.9/33.8/201    Assessment & Plan:  82 y.o. female with underlying cardiomyopathy and kyphosis on her chest x-ray imaging with compression fractures likely contributing to some element of restriction. Explained to the patient that she could have some underlying COPD/emphysema but without significant symptoms to suggest this I do not feel strongly about more in-depth imaging of her pulmonary parenchyma or pulmonary function testing. Her walk test today shows no significant desaturation or oxygen requirement with ambulation. I am going to try the patient on an empiric albuterol inhaler to see if her symptoms significantly improve. I do question whether or not her anemia may be progressively worsening which could be contributing to her progressive decline. I instructed her to contact my office if she had any worsening in her breathing before her next appointment as I would be happy to see her sooner if needed.   1. Shortness of  Breath: Likely multifactorial from her underlying cardiomyopathy. Repeating CBC to check for worsening anemia today. Empiric trial of albuterol inhaler with spacer to use as needed with exertion. 2. Anemia: Unclear etiology. Checking serum CBC with differential today. 3. Follow-up: Return to clinic in 8 weeks or sooner if needed.  Sonia Baller Ashok Cordia, M.D. [facility] Pager:  [phone] After 3pm or if no response,  call [phone] 2:19 PM 07/23/16

## 2016-08-07 ENCOUNTER — Other Ambulatory Visit: Payer: Self-pay | Admitting: Cardiology

## 2016-08-08 ENCOUNTER — Other Ambulatory Visit: Payer: Self-pay | Admitting: Internal Medicine

## 2016-08-13 DIAGNOSIS — E538 Deficiency of other specified B group vitamins: Secondary | ICD-10-CM | POA: Diagnosis not present

## 2016-08-26 IMAGING — CR DG PELVIS 1-2V
1 series · 1 of 1 positions shown · non-contrast
Comparison: None.

CLINICAL DATA: Right posterior pelvic and right hip pain, fell
several days ago

EXAM:
PELVIS - 1-2 VIEW

[t pelvis a.p.]
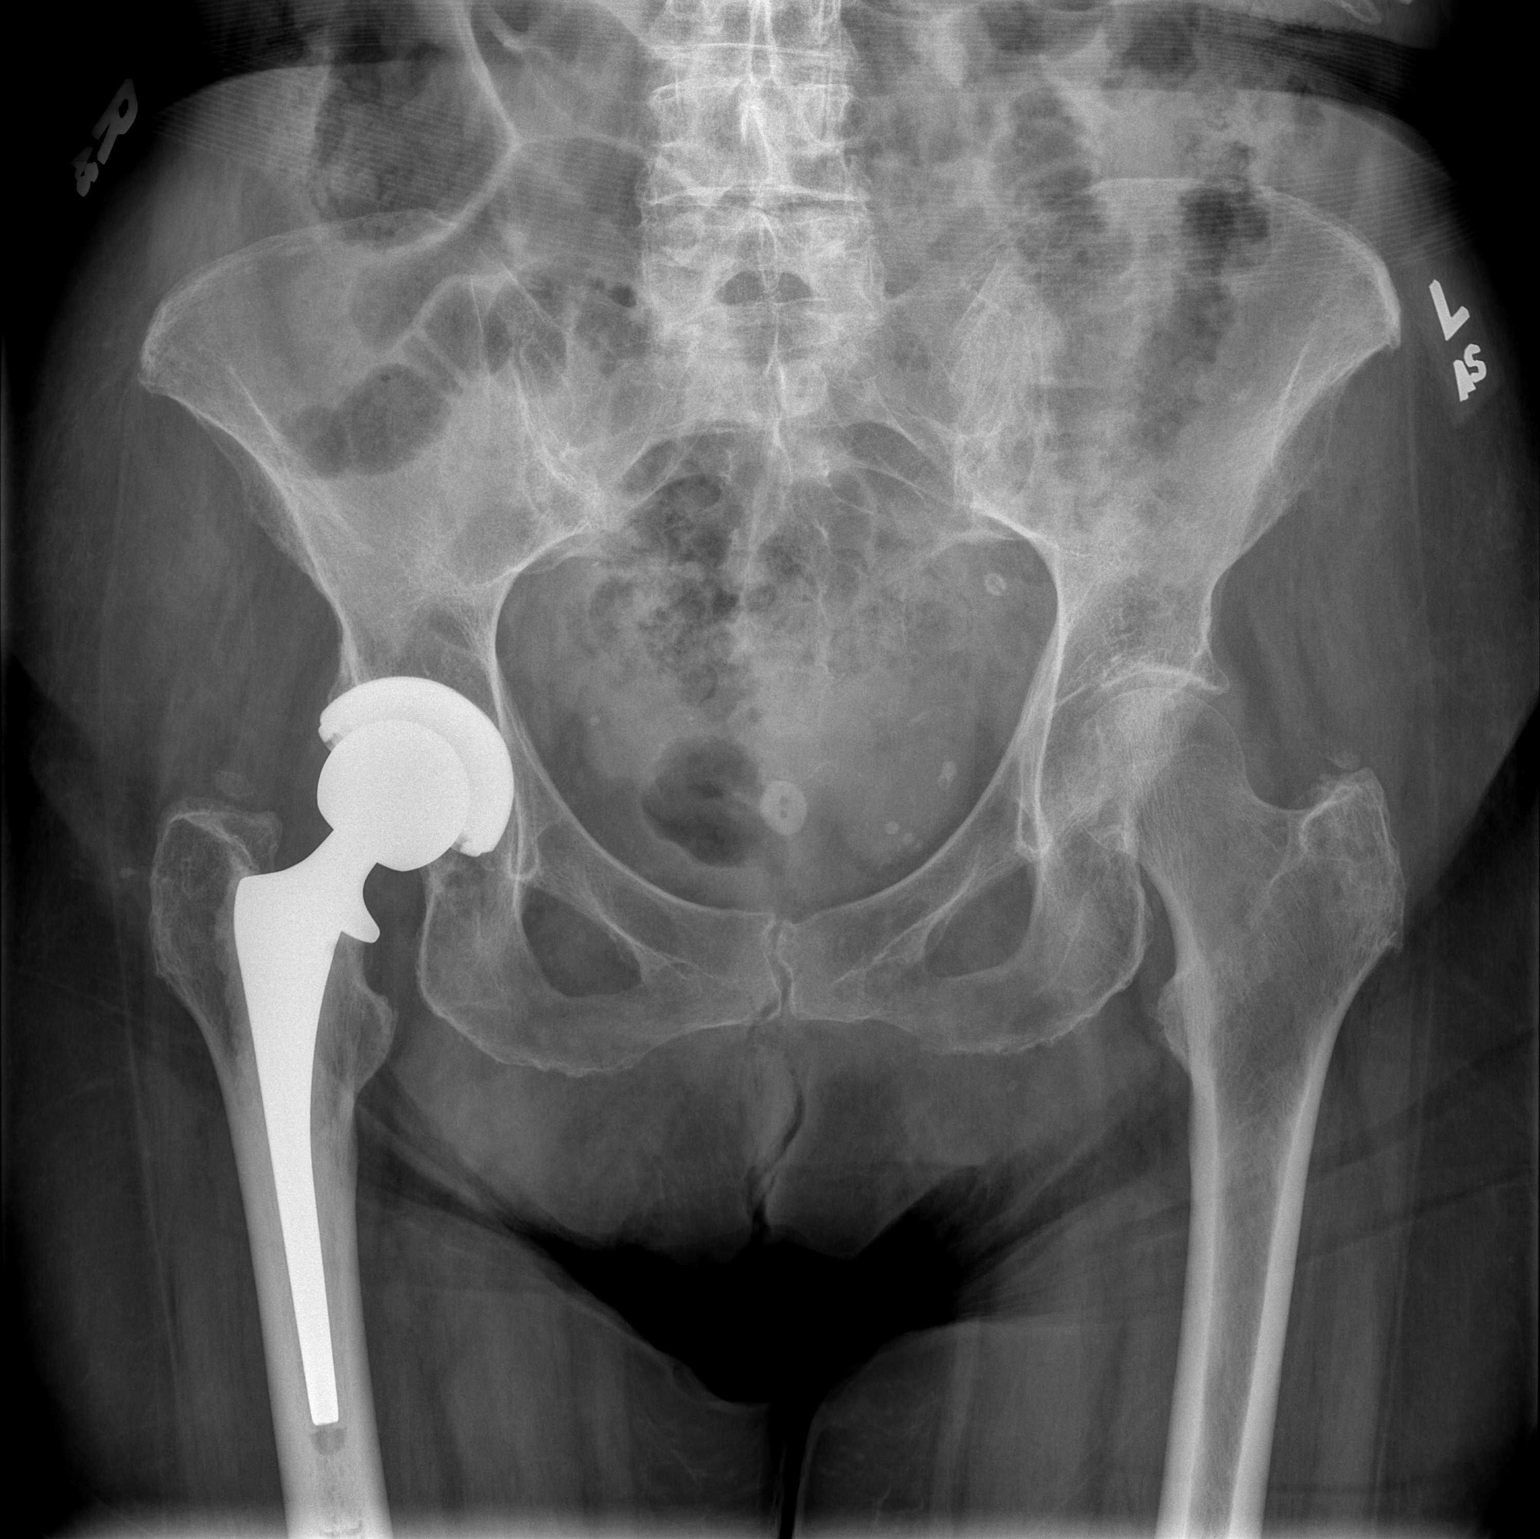

[1 of 1 positions shown; findings below may reference images not displayed]

FINDINGS: The femoral and acetabular components of the right hip replacement
are in good position. No complicating features are seen. No acute
fracture is noted. The bones are osteopenic. Only mild degenerative
joint disease of the left hip is present. The pelvic rami are
intact. There are degenerative changes in the lower lumbar spine.
IMPRESSION: 1. No acute fracture.  Osteopenia.
2. Right total hip replacement components in good position. No
complicating features.

## 2016-09-11 DIAGNOSIS — H353124 Nonexudative age-related macular degeneration, left eye, advanced atrophic with subfoveal involvement: Secondary | ICD-10-CM | POA: Diagnosis not present

## 2016-09-11 DIAGNOSIS — H353213 Exudative age-related macular degeneration, right eye, with inactive scar: Secondary | ICD-10-CM | POA: Diagnosis not present

## 2016-09-11 DIAGNOSIS — Z961 Presence of intraocular lens: Secondary | ICD-10-CM | POA: Diagnosis not present

## 2016-09-11 DIAGNOSIS — H3589 Other specified retinal disorders: Secondary | ICD-10-CM | POA: Diagnosis not present

## 2016-09-11 DIAGNOSIS — E538 Deficiency of other specified B group vitamins: Secondary | ICD-10-CM | POA: Diagnosis not present

## 2016-09-25 ENCOUNTER — Encounter: Payer: Self-pay | Admitting: Cardiology

## 2016-09-25 ENCOUNTER — Encounter: Payer: Self-pay | Admitting: Pulmonary Disease

## 2016-09-25 ENCOUNTER — Ambulatory Visit (INDEPENDENT_AMBULATORY_CARE_PROVIDER_SITE_OTHER): Payer: Medicare Other | Admitting: Pulmonary Disease

## 2016-09-25 VITALS — BP 108/62 | HR 71 | Ht 64.5 in | Wt 180.2 lb

## 2016-09-25 DIAGNOSIS — R05 Cough: Secondary | ICD-10-CM

## 2016-09-25 DIAGNOSIS — R059 Cough, unspecified: Secondary | ICD-10-CM

## 2016-09-25 DIAGNOSIS — R0602 Shortness of breath: Secondary | ICD-10-CM | POA: Diagnosis not present

## 2016-09-25 NOTE — Progress Notes (Signed)
Subjective:    Patient ID: [name], female    DOB: October 29, 1922, 83 y.o.   MRN: [id number]  C.C.:  Follow-up for Shortness of Breath.  HPI Shortness of Breath:  Multifactorial. Patient given albuterol with spacer at last appointment to try. She does feel she is "better". She isn't sure that the inhaler is helping. She is worried that she may be using it wrongly but is using a spacer. She is coughing frequently and is nonproductive. She has had wheezing that is improving as well.   Review of Systems Her swelling is improved with her compression stockings. No chest tightness or pain. No fever or chills. No abdominal pain or nausea. No reflux, dyspepsia or morning brash water taste.   Allergies  Allergen Reactions  . Celebrex [Celecoxib] Swelling and Cough    Reported by Dr. Andrew Au office on 07/22/2016.     Current Outpatient Prescriptions on File Prior to Visit  Medication Sig Dispense Refill  . albuterol (PROVENTIL HFA;VENTOLIN HFA) 108 (90 Base) MCG/ACT inhaler Inhale 2 puffs into the lungs every 6 (six) hours as needed for wheezing or shortness of breath. 1 Inhaler 6  . aspirin EC 81 MG tablet Take 81 mg by mouth daily.    . carvedilol (COREG) 6.25 MG tablet Take 6.25 mg by mouth 2 (two) times daily with a meal.    . Cholecalciferol (VITAMIN D-3) 5000 UNITS TABS Take 5,000 Units by mouth daily.     . clopidogrel (PLAVIX) 75 MG tablet Take 1 tablet (75 mg total) by mouth daily. 30 tablet 11  . furosemide (LASIX) 20 MG tablet Take 1 tablet (20 mg total) by mouth daily. 30 tablet 11  . ibuprofen (ADVIL,MOTRIN) 200 MG tablet Take 200 mg by mouth every 6 (six) hours as needed.    . isosorbide mononitrate (IMDUR) 60 MG 24 hr tablet Take 1.5 tablets (90 mg total) by mouth daily. 135 tablet 1  . nitroGLYCERIN (NITROSTAT) 0.4 MG SL tablet Place 0.4 mg under the tongue every 5 (five) minutes as needed for chest pain.     . potassium chloride SA (K-DUR,KLOR-CON) 20 MEQ tablet Take 1 tablet  (20 mEq total) by mouth daily. 30 tablet 11  . ramipril (ALTACE) 2.5 MG capsule TAKE ONE CAPSULE EACH DAY 30 capsule 11  . simvastatin (ZOCOR) 20 MG tablet TAKE ONE TABLET BY MOUTH ONCE DAILY AT 6PM 90 tablet 1  . Spacer/Aero-Holding Chambers (AEROCHAMBER MV) inhaler Use as instructed 1 each 0  . carvedilol (COREG) 6.25 MG tablet Take 1 tablet (6.25 mg total) by mouth 2 (two) times daily. (Patient not taking: Reported on 09/25/2016) 180 tablet 2  . ramipril (ALTACE) 5 MG capsule Take 1 capsule (5 mg total) by mouth daily. (Patient not taking: Reported on 09/25/2016) 30 capsule 6   No current facility-administered medications on file prior to visit.     Past Medical History:  Diagnosis Date  . Breast cancer ([place])    bilaterally  . CAD (coronary artery disease)    w/ DES to LAD with residual diagonal disease to small for PCI 6/11 and repeat DES to LAD for restenosis 3/12  . Cardiomyopathy ([place])    Mixed CM; s/p St. Jude CRT-P implant 10/24 w/ LV lead revision 10/25 . Echo 08/2015 with EF 45%.    . Chronic systolic CHF (congestive heart failure) ([place])   . Dyslipidemia, goal LDL below 70   . GERD (gastroesophageal reflux disease)    occ  . Hematuria 02/23/2013  w/clots required hospital admission on 02/23/2013  . Hypertension   . LBBB (left bundle branch block)   . Macular degeneration   . Mild mitral regurgitation   . Osteoarthritis    "right knee" (02/23/2013)  . Osteopenia   . Pacemaker   . Pulmonary HTN 08/2015   PASP 37mmHg on ech  . Umbilical hernia    Repaired on 01/10/2013   . Vitamin B12 deficiency     Past Surgical History:  Procedure Laterality Date  . BI-VENTRICULAR PACEMAKER INSERTION N/A 05/27/2012   Procedure: BI-VENTRICULAR PACEMAKER INSERTION (CRT-P);  Surgeon: Deboraha Sprang, MD;  Location: [facility] CATH LAB;  Service: Cardiovascular;  Laterality: N/A;  . BREAST BIOPSY  1995   bilaterally  . CATARACT EXTRACTION W/ INTRAOCULAR LENS  IMPLANT, BILATERAL  1990's  . CORONARY  ANGIOPLASTY WITH STENT PLACEMENT  2010   "1" (05/27/2012)  . HERNIA REPAIR    . INSERT / REPLACE / REMOVE PACEMAKER  05/27/2012   initial placement - BiV PPM  . INSERT / REPLACE / REMOVE PACEMAKER  05/31/2012   "lead change/repaired" (05/31/2012)  . INSERTION OF MESH N/A 01/10/2013   Procedure: INSERTION OF MESH;  Surgeon: Haywood Lasso, MD;  Location: [place];  Service: General;  Laterality: N/A;  . JOINT REPLACEMENT    . LEAD REVISION N/A 05/28/2012   Procedure: LEAD REVISION;  Surgeon: Evans Lance, MD;  Location: [facility] CATH LAB;  Service: Cardiovascular;  Laterality: N/A;  . LEAD REVISION Left 05/31/2012   Procedure: LEAD REVISION;  Surgeon: Deboraha Sprang, MD;  Location: [facility] CATH LAB;  Service: Cardiovascular;  Laterality: Left;  Marland Kitchen MASTECTOMY  1995   bilaterally  . TONSILLECTOMY    . TOTAL HIP ARTHROPLASTY  2011   right  . UMBILICAL HERNIA REPAIR N/A 01/10/2013   Procedure: HERNIA REPAIR UMBILICAL ADULT;  Surgeon: Haywood Lasso, MD;  Location: [facility] OR;  Service: General;  Laterality: N/A;    Family History  Problem Relation Age of Onset  . Heart disease Mother   . Breast cancer Mother   . Heart disease Father   . Breast cancer Maternal Aunt   . Breast cancer Daughter   . Breast cancer Maternal Aunt   . Asthma Grandchild     Social History   Social History  . Marital status: Widowed    Spouse name: N/A  . Number of children: N/A  . Years of education: N/A   Social History Main Topics  . Smoking status: Former Smoker    Packs/day: 1.00    Years: 20.00    Types: Cigarettes    Start date: 03/17/1943    Quit date: 01/05/1963  . Smokeless tobacco: Never Used  . Alcohol use 3.0 oz/week    5 Glasses of wine per week     Comment: 02/23/2013 "glass of wine ~ 5X/wk":  . Drug use: No  . Sexual activity: Not Currently   Other Topics Concern  . None   Social History Narrative   Originally from [place], [place]. Previously lived in [place], [place]. Graduated college with a  business degree (BS in [employer]). Currently enjoys reading. Previously enjoyed sewing and knitting. Previously also enjoyed water skiing. Previously worked as a Network engineer. Husband was a Personnel officer. Also previously worked for the [employer] for a year. 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 Doesn't have down pillows. No mold or asbestos exposure.       Objective:   Physical Exam BP 108/62 (BP Location:  Left Arm, Patient Position: Sitting, Cuff Size: Normal)   Pulse 71   Ht 5' 4.5" (1.638 m)   Wt 180 lb 3.2 oz (81.7 kg)   SpO2 96%   BMI 30.45 kg/m  Elderly female. No acute distress. Accompanied by daughter today. Integument: Warm and dry. No rash on exposed skin. HEENT: Moist mucous membranes. No oral ulcers. No scleral icterus. Cardiovascular: Regular rate. Compression stockings in place. No appreciable JVD. Pulmonary: Good aeration bilaterally. Clear with auscultation. No accessory muscle use on room air. Abdomen: Soft. Nondistended. Nontender.   WALK TEST 07/23/16:  Walked 1 lap / Baseline Sat 95% on RA / Nadir Sat 93%  IMAGING CXR PA/LAT 07/11/15 (previously reviewed by me): No focal opacity or effusion appreciated. Implanted device with cardiac leads noted. Marked kyphosis with compression fractures on lateral view. Heart normal in size & mediastinum otherwise normal in contour.  CARDIAC NUCLEAR STRESS TEST (11/27/15): New defect at rest and with stress in basal inferior and mid inferior location. Myocardial perfusion normal with a low risk study. Nuclear stress EF 64%. LV ejection fraction 55-65%.  TTE (09/04/15): LV normal in size with EF 45%. Diffuse hypokinesis with septal lateral dyssynchrony. Grade 1 diastolic dysfunction. LA mildly dilated & RA normal in size. RV normal in size as well as function with pacer wire or catheter noted within right ventricle. No aortic stenosis or regurgitation. Aortic root normal in size. Mild mitral  regurgitation without stenosis. Trivial pulmonic regurgitation. Trivial tricuspid regurgitation. No pericardial effusion.  LABS 07/23/16 CBC:  6.8/10.6/31.4/223  05/30/16 BMP:  143/4.3/108/27/26/1.11/90/8.5  11/22/15 CBC:  6.9/10.9/33.8/201    Assessment & Plan:  83 y.o. female with shortness of breath likely from multiple etiologies including her anemia, cardiomyopathy, and kyphosis. Overall patient's symptoms seem to be progressively improving. She reports her cough and wheezing are also improving. She has no symptoms that would suggest reflux or postnasal drainage but was briefly off her ACE inhibitor which may have contributed to her symptom improvement. Certainly this would not cause for wheezing. Given her continued clinical improvement I instructed the patient contact my office if she failed to continue to improve or had any other new breathing problems that I could help her with.  1. Shortness of breath: Likely multifactorial. Improving. Recommended continuing albuterol inhaler if this seems to help significantly. 2. Cough: Possibly secondary to ACE inhibitor. Recommended addressing alternative agents with cardiologist. 3. Follow-up: Return to clinic as needed.   Sonia Baller Ashok Cordia, M.D. [facility] Pager:  [phone] After 3pm or if no response, call [phone] 11:45 AM 09/25/16

## 2016-09-25 NOTE — Patient Instructions (Signed)
   Touch base with Dr. Radford Pax to see if she can switch you over to a different medicine than Altace because this could be contributing to your cough.  Call me if your cough & breathing don't continue to improve or the inhaler really seems to help.  I will see you back as needed so please call if you feel like things are not getting better.

## 2016-09-26 ENCOUNTER — Telehealth: Payer: Self-pay

## 2016-09-26 MED ORDER — LOSARTAN POTASSIUM 50 MG PO TABS
50.0000 mg | ORAL_TABLET | Freq: Every day | ORAL | 11 refills | Status: DC
Start: 2016-09-26 — End: 2016-10-08

## 2016-09-26 NOTE — Telephone Encounter (Signed)
See MyChart messages. Losartan 50 mg daily called in.   "September 26, 2016  Me  to TYEESHA HERIN     8:46 AM  All right! I have called in a prescription for Losartan 50 mg daily to [facility]. Just have the nurse call me (or send me a log on MyChart) in a week or so. Ideally, the BP should be checked a couple hours after taking morning meds to get the most accurately treated blood pressure readings.   Thank you so much!   Valetta Fuller, RN    This MyChart message has not been read.      8:30 AM  Rodman Key, RN routed this conversation to Me  September 25, 2016  Lauro Franklin  to Sueanne Margarita, MD     5:35 PM  I got your message. Thanks! Yes let's try the different medicine. I can get her BP checked by the nurse at [facility].   Me  to Lauro Franklin     5:30 PM  Hello!   Dr. Radford Pax said that Ms. [name] can STOP ALTACE and START LOSARTAN 50 mg daily and let us know if her cough does not resolve.   Please respond back to me so I know you ladies agree and I can call in the new medication. If she does change medications, we will need her to check her blood pressure daily for a week and let us know the results so we can see if the change is keeping her BP under control.   Thank you!   Valetta Fuller, RN    Last read by Lauro Franklin at 5:32 PM on 09/25/2016.  Sueanne Margarita, MD  to Me      3:44 PM  Change to Losartan 50mg  daily and let us know if cough does not resolve. Have her check her BP daily for a week and call with results   Fransico Him      3:42 PM  Cleon Gustin, RN routed this conversation to Sueanne Margarita, MD . Me  Lauro Franklin  to Sueanne Margarita, MD      3:39 PM  My mother, Traneisha Tuchman, has been taking Ramipril (Altace). She has developed a nagging non productive cough. It was suggested by another physician that possibly the Ramipril was contributing to the cough and possibly a different medicine could be tried. Just wondering your thoughts. If  you need any more information concerning this please contact her at [phone] or me (Debbe) at [phone]. Thanks!  Debbe [name]"

## 2016-10-07 ENCOUNTER — Other Ambulatory Visit: Payer: Self-pay | Admitting: Cardiology

## 2016-10-07 ENCOUNTER — Encounter: Payer: Self-pay | Admitting: Cardiology

## 2016-10-08 ENCOUNTER — Telehealth: Payer: Self-pay

## 2016-10-08 MED ORDER — HYDRALAZINE HCL 10 MG PO TABS: 10.0000 mg | ORAL_TABLET | Freq: Three times a day (TID) | ORAL | 11 refills | Status: DC

## 2016-10-08 NOTE — Telephone Encounter (Signed)
Monica Margarita, MD  to Me       9:09 PM  Stop Losartan and start Hydralazine 10mg  TID for LV dysfunction Please have her check her BP daily for a week and call with results.    Fransico Him     Per Dr. Radford Pax, instructed DPR to have patient STOP LOSARTAN and START HYDRALAZINE 10 mg TID. She understands to check BP daily for a week and contact the office with results. She agrees with treatment plan.

## 2016-10-09 DIAGNOSIS — E538 Deficiency of other specified B group vitamins: Secondary | ICD-10-CM | POA: Diagnosis not present

## 2016-10-15 ENCOUNTER — Other Ambulatory Visit: Payer: Self-pay | Admitting: Cardiology

## 2016-10-21 ENCOUNTER — Encounter: Payer: Self-pay | Admitting: Cardiology

## 2016-10-24 ENCOUNTER — Other Ambulatory Visit: Payer: Self-pay

## 2016-10-24 MED ORDER — RAMIPRIL 5 MG PO CAPS: 5.0000 mg | ORAL_CAPSULE | Freq: Every day | ORAL | 11 refills | Status: DC

## 2016-11-03 ENCOUNTER — Encounter: Payer: Self-pay | Admitting: Internal Medicine

## 2016-11-07 DIAGNOSIS — E538 Deficiency of other specified B group vitamins: Secondary | ICD-10-CM | POA: Diagnosis not present

## 2016-11-17 ENCOUNTER — Encounter: Payer: Self-pay | Admitting: Internal Medicine

## 2016-11-17 ENCOUNTER — Ambulatory Visit (INDEPENDENT_AMBULATORY_CARE_PROVIDER_SITE_OTHER): Payer: Medicare Other | Admitting: Internal Medicine

## 2016-11-17 VITALS — BP 116/64 | HR 71 | Ht 64.0 in | Wt 180.0 lb

## 2016-11-17 DIAGNOSIS — I255 Ischemic cardiomyopathy: Secondary | ICD-10-CM | POA: Diagnosis not present

## 2016-11-17 DIAGNOSIS — I447 Left bundle-branch block, unspecified: Secondary | ICD-10-CM | POA: Diagnosis not present

## 2016-11-17 DIAGNOSIS — Z950 Presence of cardiac pacemaker: Secondary | ICD-10-CM

## 2016-11-17 DIAGNOSIS — I5022 Chronic systolic (congestive) heart failure: Secondary | ICD-10-CM

## 2016-11-17 DIAGNOSIS — I2589 Other forms of chronic ischemic heart disease: Secondary | ICD-10-CM

## 2016-11-17 NOTE — Addendum Note (Signed)
Addended byAlvis Lemmings C on: 11/17/2016 01:10 PM   Modules accepted: Orders

## 2016-11-17 NOTE — Patient Instructions (Signed)
Medication Instructions: - Your physician has recommended you make the following change in your medication:  1) Stop altace (ramipril)  2) please call Dr. Olin Pia nurse, Nira Conn, at [phone] today to verify the dose on your lasix (furosemide)  Labwork: - none ordered  Procedures/Testing: - none ordered  Follow-Up: - Your physician wants you to follow-up in: 1 year with Dr. Caryl Comes. You will receive a reminder letter in the mail two months in advance. If you don't receive a letter, please call our office to schedule the follow-up appointment.   Any Additional Special Instructions Will Be Listed Below (If Applicable).     If you need a refill on your cardiac medications before your next appointment, please call your pharmacy.

## 2016-11-17 NOTE — Progress Notes (Signed)
Patient Care Team: Gaynelle Arabian, MD as PCP - General (Family Medicine)   HPI  [name] is a 83 y.o. female Seen in followup for congestive heart failure in the setting of ischemic and ischemic heart disease status post CRT P . Implantation complicated by lead dislodgment X3  She thinks that she is functionally improved since the CRT implantation. Her edema remains a problem. She also significant dyspnea on exertion.    she's had a problem with a cough may try to get her off of ramipril. She ended up with significant hypotension.    Interval echocardiogram 9/15 demonstrated normalization of LV systolic function with mild left atrial dilatation  Myoview 4 /17 retained normalization of LV function and without ischemia   Past Medical History:  Diagnosis Date  . Breast cancer ([place])    bilaterally  . CAD (coronary artery disease)    w/ DES to LAD with residual diagonal disease to small for PCI 6/11 and repeat DES to LAD for restenosis 3/12  . Cardiomyopathy ([place])    Mixed CM; s/p St. Jude CRT-P implant 10/24 w/ LV lead revision 10/25 . Echo 08/2015 with EF 45%.    . Chronic systolic CHF (congestive heart failure) ([place])   . Dyslipidemia, goal LDL below 70   . GERD (gastroesophageal reflux disease)    occ  . Hematuria 02/23/2013   w/clots required hospital admission on 02/23/2013  . Hypertension   . LBBB (left bundle branch block)   . Macular degeneration   . Mild mitral regurgitation   . Osteoarthritis    "right knee" (02/23/2013)  . Osteopenia   . Pacemaker   . Pulmonary HTN ([place]) 08/2015   PASP 68mmHg on ech  . Umbilical hernia    Repaired on 01/10/2013   . Vitamin B12 deficiency     Past Surgical History:  Procedure Laterality Date  . BI-VENTRICULAR PACEMAKER INSERTION N/A 05/27/2012   Procedure: BI-VENTRICULAR PACEMAKER INSERTION (CRT-P);  Surgeon: Deboraha Sprang, MD;  Location: [facility] CATH LAB;  Service: Cardiovascular;  Laterality: N/A;  . BREAST BIOPSY  1995   bilaterally  . CATARACT EXTRACTION W/ INTRAOCULAR LENS  IMPLANT, BILATERAL  1990's  . CORONARY ANGIOPLASTY WITH STENT PLACEMENT  2010   "1" (05/27/2012)  . HERNIA REPAIR    . INSERT / REPLACE / REMOVE PACEMAKER  05/27/2012   initial placement - BiV PPM  . INSERT / REPLACE / REMOVE PACEMAKER  05/31/2012   "lead change/repaired" (05/31/2012)  . INSERTION OF MESH N/A 01/10/2013   Procedure: INSERTION OF MESH;  Surgeon: Haywood Lasso, MD;  Location: [place];  Service: General;  Laterality: N/A;  . JOINT REPLACEMENT    . LEAD REVISION N/A 05/28/2012   Procedure: LEAD REVISION;  Surgeon: Evans Lance, MD;  Location[facility] C[facility] LAB;  Service: Cardiovascular;  Laterality: N/A;  . LEAD REVISION Left 05/31/2012   Procedure: LEAD REVISION;  Surgeon: Deboraha Sprang, MD;  Location: [facility] CATH LAB;  Service: Cardiovascular;  Laterality: Left;  Marland Kitchen MASTECTOMY  1995   bilaterally  . TONSILLECTOMY    . TOTAL HIP ARTHROPLASTY  2011   right  . UMBILICAL HERNIA REPAIR N/A 01/10/2013   Procedure: HERNIA REPAIR UMBILICAL ADULT;  Surgeon: Haywood Lasso, MD;  Location: [place];  Service: General;  Laterality: N/A;    Current Outpatient Prescriptions  Medication Sig Dispense Refill  . albuterol (PROVENTIL HFA;VENTOLIN HFA) 108 (90 Base) MCG/ACT inhaler Inhale 2 puffs into the lungs every 6 (six) hours  as needed for wheezing or shortness of breath. 1 Inhaler 6  . aspirin EC 81 MG tablet Take 81 mg by mouth daily.    . carvedilol (COREG) 6.25 MG tablet Take 6.25 mg by mouth 2 (two) times daily with a meal.    . Cholecalciferol (VITAMIN D-3) 5000 UNITS TABS Take 5,000 Units by mouth daily.     . clopidogrel (PLAVIX) 75 MG tablet Take 1 tablet (75 mg total) by mouth daily. 30 tablet 11  . furosemide (LASIX) 20 MG tablet Take 1 tablet (20 mg total) by mouth daily. 30 tablet 11  . ibuprofen (ADVIL,MOTRIN) 200 MG tablet Take 200 mg by mouth every 6 (six) hours as needed.    . isosorbide mononitrate (IMDUR) 60 MG 24 hr  tablet TAKE ONE AND ONE-HALF TABLETS DAILY 135 tablet 1  . nitroGLYCERIN (NITROSTAT) 0.4 MG SL tablet Place 0.4 mg under the tongue every 5 (five) minutes as needed for chest pain.     . potassium chloride SA (K-DUR,KLOR-CON) 20 MEQ tablet Take 1 tablet (20 mEq total) by mouth daily. 30 tablet 11  . ramipril (ALTACE) 5 MG capsule Take 1 capsule (5 mg total) by mouth daily. 30 capsule 11  . simvastatin (ZOCOR) 20 MG tablet Take 1 tablet (20 mg total) by mouth daily at 6 PM. 90 tablet 1  . Spacer/Aero-Holding Chambers (AEROCHAMBER MV) inhaler Use as instructed 1 each 0   No current facility-administered medications for this visit.     Allergies  Allergen Reactions  . Altace [Ramipril] Cough  . Celebrex [Celecoxib] Swelling and Cough    Reported by Dr. Andrew Au office on 07/22/2016.     Review of Systems negative except from HPI and PMH  Physical Exam BP 116/64   Pulse 71   Ht 5\' 4"  (1.626 m)   Wt 180 lb (81.6 kg)   SpO2 94%   BMI 30.90 kg/m  Well developed and well nourished in no acute distress HENT normal E scleral and icterus clear Neck Supple JVP 9-10; carotids brisk and full Clear to ausculation Device pocket well healed; without hematoma or erythema   Regular rate and rhythm, no murmurs gallops or rub Soft with active bowel sounds No clubbing cyanosis 2+ and non-pitting Edema Alert and oriented, grossly normal motor and sensory function Skin Warm and Dry  ECG personally reviewed  P-synchronous/ AV  Pacing QRS is upright in lead V1 and qR in lead 1    Assessment and  Plan  Ischemic and nonischemic heart disease  Cardiomyopathy-resolved  CRT P  The patient's device was interrogated.  The information was reviewed. No changes were made in the programming.     Cough  Chronic edema  DOE  HFpEF   I suspect there is a component of HFpEF with her edema and jugular venous distention. We will increase her diuretics but she is not entirely clear on her home dose.   She will call us and let us know.  With interval resolution of her LV function, ACE inhibitor as normal twice. It is likely the cause of her cough. Her blood pressures are relatively low. I will take the liberty of discontinuing her ACE inhibitor.   Without symptoms of ischemia

## 2016-11-18 ENCOUNTER — Telehealth: Payer: Self-pay | Admitting: Internal Medicine

## 2016-11-18 NOTE — Telephone Encounter (Signed)
Call received yesterday afternoon from the patient's daughter, confirming her dose of lasix - she is currently taking 20 mg - 2 tablets (40 mg) by mouth once daily.  Reviewed with Dr. Caryl Comes- he would like her to increase lasix to 80 mg once every other day alternating with 40 mg once every other day x 10 days, then resume her normal dosing.  I attempted to call the patient back at her home # per her daughter, but no answer x multiple rings and no machine. Will try back later.

## 2016-11-18 NOTE — Telephone Encounter (Signed)
I spoke with the patient. She is aware of Dr. Olin Pia recommendations for her lasix.

## 2016-11-21 ENCOUNTER — Encounter: Payer: Medicare Other | Admitting: Internal Medicine

## 2016-11-24 ENCOUNTER — Ambulatory Visit (HOSPITAL_COMMUNITY): Payer: Medicare Other | Attending: Cardiology

## 2016-11-24 ENCOUNTER — Other Ambulatory Visit: Payer: Self-pay

## 2016-11-24 ENCOUNTER — Encounter: Payer: Self-pay | Admitting: Cardiology

## 2016-11-24 ENCOUNTER — Ambulatory Visit (INDEPENDENT_AMBULATORY_CARE_PROVIDER_SITE_OTHER): Payer: Medicare Other | Admitting: Cardiology

## 2016-11-24 VITALS — BP 118/64 | HR 76 | Ht 64.0 in | Wt 176.4 lb

## 2016-11-24 DIAGNOSIS — E785 Hyperlipidemia, unspecified: Secondary | ICD-10-CM

## 2016-11-24 DIAGNOSIS — I429 Cardiomyopathy, unspecified: Secondary | ICD-10-CM | POA: Diagnosis not present

## 2016-11-24 DIAGNOSIS — I420 Dilated cardiomyopathy: Secondary | ICD-10-CM | POA: Diagnosis not present

## 2016-11-24 DIAGNOSIS — I25118 Atherosclerotic heart disease of native coronary artery with other forms of angina pectoris: Secondary | ICD-10-CM

## 2016-11-24 DIAGNOSIS — I5022 Chronic systolic (congestive) heart failure: Secondary | ICD-10-CM | POA: Diagnosis not present

## 2016-11-24 DIAGNOSIS — I255 Ischemic cardiomyopathy: Secondary | ICD-10-CM | POA: Diagnosis not present

## 2016-11-24 DIAGNOSIS — I340 Nonrheumatic mitral (valve) insufficiency: Secondary | ICD-10-CM | POA: Diagnosis not present

## 2016-11-24 DIAGNOSIS — I272 Pulmonary hypertension, unspecified: Secondary | ICD-10-CM | POA: Insufficient documentation

## 2016-11-24 DIAGNOSIS — R0602 Shortness of breath: Secondary | ICD-10-CM | POA: Insufficient documentation

## 2016-11-24 DIAGNOSIS — I209 Angina pectoris, unspecified: Secondary | ICD-10-CM | POA: Diagnosis not present

## 2016-11-24 DIAGNOSIS — I10 Essential (primary) hypertension: Secondary | ICD-10-CM | POA: Diagnosis not present

## 2016-11-24 MED ORDER — FUROSEMIDE 20 MG PO TABS: 60.0000 mg | ORAL_TABLET | Freq: Every day | ORAL | 11 refills | Status: DC

## 2016-11-24 MED ORDER — PANTOPRAZOLE SODIUM 40 MG PO TBEC: 40.0000 mg | DELAYED_RELEASE_TABLET | Freq: Every day | ORAL | 11 refills | Status: DC

## 2016-11-24 NOTE — Progress Notes (Signed)
Cardiology Office Note    Date:  11/24/2016   ID:  [name], DOB [birth year], MRN [id number]  PCP:  Simona Huh, MD  Cardiologist:  Fransico Him, MD   Chief Complaint  Patient presents with  . Coronary Artery Disease  . Hypertension  . Hyperlipidemia    History of Present Illness:  [name] is a 83 y.o. female with a history of ASCAD s/p DES to LAD with residual disease of the diag too small for PCI by cath 01/2010 and then repeat cath with restenosis of the LAD s/p PCI 10/2010, chronic stable angina, ischemic DCM ( EF 45% on echo 08/2015), LBBB, BiVPPM, HTN, dyslipidemia and chronic systolic CHF who presents today for followup.   She is doing well and denies any anginal chest pain or pressure, dizziness,PND, orthopnea, palpitations or syncope. She has chronic DOE which is stable.  She has a chronic cough that she says does not bother her but she says it bothers everyone else.  She does not cough anything up.  Her ramapril was stopped a week ago when she told Dr. Caryl Comes about her cough but this did not improve her cough.  She was also having some problems with low BP which has improved off ACE I.  She thinks she has had improvement in her general well being since her CRT implant.  She has chronic LE edema which is stable as long as she wears compression hose. She complains of chronic fatigue. She saw Dr. Caryl Comes last week and had JVD and LE edema and felt that she may have a component of HFpEF. Her weight is stable and she has actually lost 4lbs since her lasix was increased last week.    Past Medical History:  Diagnosis Date  . Breast cancer ([place])    bilaterally  . CAD (coronary artery disease)    w/ DES to LAD with residual diagonal disease to small for PCI 6/11 and repeat DES to LAD for restenosis 3/12  . Cardiomyopathy ([place])    Mixed CM; s/p St. Jude CRT-P implant 10/24 w/ LV lead revision 10/25 . Echo 08/2015 with EF 45%.    . Chronic systolic CHF (congestive heart  failure) ([place])   . Dyslipidemia, goal LDL below 70   . GERD (gastroesophageal reflux disease)    occ  . Hematuria 02/23/2013   w/clots required hospital admission on 02/23/2013  . Hypertension   . LBBB (left bundle branch block)   . Macular degeneration   . Mild mitral regurgitation   . Osteoarthritis    "right knee" (02/23/2013)  . Osteopenia   . Pacemaker   . Pulmonary HTN ([place]) 08/2015   PASP 69mmHg on ech  . Umbilical hernia    Repaired on 01/10/2013   . Vitamin B12 deficiency     Past Surgical History:  Procedure Laterality Date  . BI-VENTRICULAR PACEMAKER INSERTION N/A 05/27/2012   Procedure: BI-VENTRICULAR PACEMAKER INSERTION (CRT-P);  Surgeon: Deboraha Sprang, MD;  Location: [facility] CATH LAB;  Service: Cardiovascular;  Laterality: N/A;  . BREAST BIOPSY  1995   bilaterally  . CATARACT EXTRACTION W/ INTRAOCULAR LENS  IMPLANT, BILATERAL  1990's  . CORONARY ANGIOPLASTY WITH STENT PLACEMENT  2010   "1" (05/27/2012)  . HERNIA REPAIR    . INSERT / REPLACE / REMOVE PACEMAKER  05/27/2012   initial placement - BiV PPM  . INSERT / REPLACE / REMOVE PACEMAKER  05/31/2012   "lead change/repaired" (05/31/2012)  . INSERTION OF MESH N/A 01/10/2013  Procedure: INSERTION OF MESH;  Surgeon: Haywood Lasso, MD;  Location: [facility];  Service: General;  Laterality: N/A;  . JOINT REPLACEMENT    . LEAD REVISION N/A 05/28/2012   Procedure: LEAD REVISION;  Surgeon: Evans Lance, MD;  Location: [facility] CATH LAB;  Service: Cardiovascular;  Laterality: N/A;  . LEAD REVISION Left 05/31/2012   Procedure: LEAD REVISION;  Surgeon: Deboraha Sprang, MD;  Location: [facility] CATH LAB;  Service: Cardiovascular;  Laterality: Left;  Marland Kitchen MASTECTOMY  1995   bilaterally  . TONSILLECTOMY    . TOTAL HIP ARTHROPLASTY  2011   right  . UMBILICAL HERNIA REPAIR N/A 01/10/2013   Procedure: HERNIA REPAIR UMBILICAL ADULT;  Surgeon: Haywood Lasso, MD;  Location: [facility];  Service: General;  Laterality: N/A;    Current  Medications: Current Meds  Medication Sig  . albuterol (PROVENTIL HFA;VENTOLIN HFA) 108 (90 Base) MCG/ACT inhaler Inhale 2 puffs into the lungs every 6 (six) hours as needed for wheezing or shortness of breath.  Marland Kitchen aspirin EC 81 MG tablet Take 81 mg by mouth daily.  . carvedilol (COREG) 6.25 MG tablet Take 6.25 mg by mouth 2 (two) times daily with a meal.  . Cholecalciferol (VITAMIN D-3) 5000 UNITS TABS Take 5,000 Units by mouth daily.   . clopidogrel (PLAVIX) 75 MG tablet Take 1 tablet (75 mg total) by mouth daily.  . furosemide (LASIX) 20 MG tablet Take 2 tablets (40 mg) by mouth once daily  . ibuprofen (ADVIL,MOTRIN) 200 MG tablet Take 200 mg by mouth every 6 (six) hours as needed.  . isosorbide mononitrate (IMDUR) 60 MG 24 hr tablet TAKE ONE AND ONE-HALF TABLETS DAILY  . nitroGLYCERIN (NITROSTAT) 0.4 MG SL tablet Place 0.4 mg under the tongue every 5 (five) minutes as needed for chest pain.   . potassium chloride SA (K-DUR,KLOR-CON) 20 MEQ tablet Take 1 tablet (20 mEq total) by mouth daily.  . simvastatin (ZOCOR) 20 MG tablet Take 1 tablet (20 mg total) by mouth daily at 6 PM.  . Spacer/Aero-Holding Chambers (AEROCHAMBER MV) inhaler Use as instructed    Allergies:   Altace [ramipril] and Celebrex [celecoxib]   Social History   Social History  . Marital status: Widowed    Spouse name: N/A  . Number of children: N/A  . Years of education: N/A   Social History Main Topics  . Smoking status: Former Smoker    Packs/day: 1.00    Years: 20.00    Types: Cigarettes    Start date: 03/17/1943    Quit date: 01/05/1963  . Smokeless tobacco: Never Used  . Alcohol use 3.0 oz/week    5 Glasses of wine per week     Comment: 02/23/2013 "glass of wine ~ 5X/wk":  . Drug use: No  . Sexual activity: Not Currently   Other Topics Concern  . None   Social History Narrative   Originally from [place], [place]. Previously lived in [place], [place]. Graduated college with a business degree (BS in  [employer]). Currently enjoys reading. Previously enjoyed sewing and knitting. Previously also enjoyed water skiing. Previously worked as a Network engineer. Husband was a Personnel officer. Also previously worked for the [employer] for a year. 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 Doesn't have down pillows. No mold or asbestos exposure.      Family History:  The patient's family history includes Asthma in her grandchild; Breast cancer in her daughter, maternal aunt, maternal aunt, and mother; Heart disease in her  father and mother.   ROS:   Please see the history of present illness.    ROS All other systems reviewed and are negative.  No flowsheet data found.     PHYSICAL EXAM:   VS:  BP 118/64   Pulse 76   Ht 5\' 4"  (1.626 m)   Wt 176 lb 6.4 oz (80 kg)   SpO2 95%   BMI 30.28 kg/m    GEN: Well nourished, well developed, in no acute distress  HEENT: normal  Neck: no JVD, carotid bruits, or masses Cardiac: RRR; no murmurs, rubs, or gallops,no edema.  Intact distal pulses bilaterally.  Respiratory:  clear to auscultation bilaterally, normal work of breathing GI: soft, nontender, nondistended, + BS MS: no deformity or atrophy  Skin: warm and dry, no rash Neuro:  Alert and Oriented x 3, Strength and sensation are intact Psych: euthymic mood, full affect  Wt Readings from Last 3 Encounters:  11/24/16 176 lb 6.4 oz (80 kg)  11/17/16 180 lb (81.6 kg)  09/25/16 180 lb 3.2 oz (81.7 kg)      Studies/Labs Reviewed:   EKG:  EKG is not  ordered today.  Recent Labs: 11/27/2015: ALT 12 05/30/2016: BUN 26; Creat 1.11; Potassium 4.3; Sodium 143 07/23/2016: Hemoglobin 10.6; Platelets 223.0   Lipid Panel    Component Value Date/Time   CHOL 130 11/27/2015 1042   TRIG 57 11/27/2015 1042   HDL 70 11/27/2015 1042   CHOLHDL 1.9 11/27/2015 1042   VLDL 11 11/27/2015 1042   LDLCALC 49 11/27/2015 1042    Additional studies/ records that were reviewed today  include:  none    ASSESSMENT:    1. Coronary artery disease of native artery of native heart with stable angina pectoris ([place])   2. Dilated cardiomyopathy ([place])   3. Chronic systolic congestive heart failure ([place])   4. Benign essential HTN   5. Pulmonary HTN ([place])   6. Dyslipidemia      PLAN:  In order of problems listed above:  1. ASCAD - s/p DES to LAD with residual diagonal disease to small for PCI 6/11 and repeat DES to LAD for restenosis 3/12.  She is doing well with no anginal symptoms.  She will continue on ASA/Plavix/BB/long acting nitrates and statin.  2. DCM with EF 45% by echo 08/2015.   3. Chronic diastolic CHF - she appears euvolemic on exam and weight is stable. She will continue on BB, diuretic.  I have recommended that she stay decrease her Lasix from 40mg  qod alt with 80mg  qod down to Lasix 60mg  daily.  I will check a BMET.   4. HTN - BP controlled on current meds. She will continue on BB 5. Pulmonary HTN with PASP 59mmHg on echo 08/2015. 2D echo pending results - it was done earlier today. 6. Dyslipidemia with LDL goal < 70.  She will continue on statin. I will check an FLP and ALT. 7. Chronic cough - ? Etiology.  This did not improve with stopping her ACE I. ? Whether this is related to underlying GERD or allergies.  I will start her on Protonix 40mg  daily.    Medication Adjustments/Labs and Tests Ordered: Current medicines are reviewed at length with the patient today.  Concerns regarding medicines are outlined above.  Medication changes, Labs and Tests ordered today are listed in the Patient Instructions below.  There are no Patient Instructions on file for this visit.   Signed, Fransico Him, MD  11/24/2016 12:46 PM  [facility] [address], [place], [place]  [address] Phone: [phone]; Fax: [phone]

## 2016-11-24 NOTE — Patient Instructions (Signed)
Medication Instructions:  1) START PROTONIX 40 mg daily 2) INCREASE LASIX to 60 mg daily  Labwork: TODAY: BMET  FASTING labs in 1 week: BMET, Lipids, ALT  Testing/Procedures: None  Follow-Up: Your physician wants you to follow-up in: 6 months with Dr. Radford Pax. You will receive a reminder letter in the mail two months in advance. If you don't receive a letter, please call our office to schedule the follow-up appointment.   Any Other Special Instructions Will Be Listed Below (If Applicable).     If you need a refill on your cardiac medications before your next appointment, please call your pharmacy.

## 2016-11-25 ENCOUNTER — Encounter: Payer: Self-pay | Admitting: Cardiology

## 2016-11-25 LAB — BASIC METABOLIC PANEL
BUN/Creatinine Ratio: 22 (ref 12–28)
BUN: 33 mg/dL (ref 10–36)
CO2: 27 mmol/L (ref 18–29)
Calcium: 9.3 mg/dL (ref 8.7–10.3)
Chloride: 102 mmol/L (ref 96–106)
Creatinine, Ser: 1.5 mg/dL — ABNORMAL HIGH (ref 0.57–1.00)
GFR calc Af Amer: 35 mL/min/{1.73_m2} — ABNORMAL LOW (ref >59)
GFR calc non Af Amer: 30 mL/min/{1.73_m2} — ABNORMAL LOW (ref >59)
Glucose: 118 mg/dL — ABNORMAL HIGH (ref 65–99)
Potassium: 5.4 mmol/L — ABNORMAL HIGH (ref 3.5–5.2)
Sodium: 145 mmol/L — ABNORMAL HIGH (ref 134–144)

## 2016-11-27 ENCOUNTER — Telehealth: Payer: Self-pay

## 2016-11-27 DIAGNOSIS — I272 Pulmonary hypertension, unspecified: Secondary | ICD-10-CM

## 2016-11-27 MED ORDER — FUROSEMIDE 40 MG PO TABS
40.0000 mg | ORAL_TABLET | Freq: Every day | ORAL | 11 refills | Status: DC
Start: 2016-11-27 — End: 2017-10-12

## 2016-11-27 NOTE — Telephone Encounter (Signed)
Informed patient of results and verbal understanding expressed.  Instructed patient to DECREASE LASIX to 40 mg daily. Instructed her to STOP POTASSIUM. She will come in 5/1 for BMET as it is already scheduled and she has a ride. Repeat ECHO ordered to be scheduled in 1 year. Patient agrees with treatment plan.

## 2016-11-27 NOTE — Telephone Encounter (Deleted)
Informed patient of results and verbal understanding expressed.  Instructed patient to DECREASE LASIX to 40 mg daily. Instructed her to STOP POTASSIUM. She will come in 5/1 for BMET as it is already scheduled and she has a ride. Repeat ECHO ordered to be scheduled in 1 year. Patient agrees with treatment plan.

## 2016-11-27 NOTE — Telephone Encounter (Signed)
-----   Message from Sueanne Margarita, MD sent at 11/25/2016 10:40 AM EDT ----- Echo showed normal LVF with mild LVH and increased stiffness of heart muscle, mild MR, mildly dilated LA, mild pulmonary HTN with PASP 54mmHg - repeat echo in 1 year for pulmonary HTN

## 2016-11-27 NOTE — Telephone Encounter (Signed)
-----   Message from Sueanne Margarita, MD sent at 11/25/2016 10:37 AM EDT ----- Creatinine has bumped.  Please have patient go back to Lasix 40mg  daily and hold potassium - K+ is too high.  Repeat BMET on Friday

## 2016-11-27 NOTE — Telephone Encounter (Deleted)
-----   Message from Sueanne Margarita, MD sent at 11/25/2016 10:40 AM EDT ----- Echo showed normal LVF with mild LVH and increased stiffness of heart muscle, mild MR, mildly dilated LA, mild pulmonary HTN with PASP 60mmHg - repeat echo in 1 year for pulmonary HTN

## 2016-11-28 ENCOUNTER — Encounter: Payer: Self-pay | Admitting: Cardiology

## 2016-12-02 ENCOUNTER — Other Ambulatory Visit: Payer: Medicare Other | Admitting: *Deleted

## 2016-12-02 DIAGNOSIS — E785 Hyperlipidemia, unspecified: Secondary | ICD-10-CM

## 2016-12-02 DIAGNOSIS — I5022 Chronic systolic (congestive) heart failure: Secondary | ICD-10-CM | POA: Diagnosis not present

## 2016-12-02 DIAGNOSIS — I10 Essential (primary) hypertension: Secondary | ICD-10-CM | POA: Diagnosis not present

## 2016-12-02 DIAGNOSIS — I25118 Atherosclerotic heart disease of native coronary artery with other forms of angina pectoris: Secondary | ICD-10-CM | POA: Diagnosis not present

## 2016-12-02 DIAGNOSIS — I420 Dilated cardiomyopathy: Secondary | ICD-10-CM

## 2016-12-02 LAB — BASIC METABOLIC PANEL
BUN/Creatinine Ratio: 23 (ref 12–28)
BUN: 33 mg/dL (ref 10–36)
CO2: 27 mmol/L (ref 18–29)
Calcium: 9.2 mg/dL (ref 8.7–10.3)
Chloride: 99 mmol/L (ref 96–106)
Creatinine, Ser: 1.45 mg/dL — ABNORMAL HIGH (ref 0.57–1.00)
GFR calc Af Amer: 36 mL/min/{1.73_m2} — ABNORMAL LOW (ref >59)
GFR calc non Af Amer: 32 mL/min/{1.73_m2} — ABNORMAL LOW (ref >59)
Glucose: 97 mg/dL (ref 65–99)
Potassium: 4.5 mmol/L (ref 3.5–5.2)
Sodium: 144 mmol/L (ref 134–144)

## 2016-12-02 LAB — LIPID PANEL
Chol/HDL Ratio: 2.3 ratio (ref 0.0–4.4)
Cholesterol, Total: 122 mg/dL (ref 100–199)
HDL: 54 mg/dL (ref >39)
LDL Calculated: 51 mg/dL (ref 0–99)
Triglycerides: 86 mg/dL (ref 0–149)
VLDL Cholesterol Cal: 17 mg/dL (ref 5–40)

## 2016-12-02 LAB — ALT: ALT: 7 IU/L (ref 0–32)

## 2016-12-05 DIAGNOSIS — E538 Deficiency of other specified B group vitamins: Secondary | ICD-10-CM | POA: Diagnosis not present

## 2016-12-10 ENCOUNTER — Other Ambulatory Visit: Payer: Self-pay | Admitting: Cardiology

## 2017-01-02 DIAGNOSIS — E538 Deficiency of other specified B group vitamins: Secondary | ICD-10-CM | POA: Diagnosis not present

## 2017-01-28 DIAGNOSIS — E538 Deficiency of other specified B group vitamins: Secondary | ICD-10-CM | POA: Diagnosis not present

## 2017-02-25 DIAGNOSIS — E538 Deficiency of other specified B group vitamins: Secondary | ICD-10-CM | POA: Diagnosis not present

## 2017-03-25 DIAGNOSIS — E538 Deficiency of other specified B group vitamins: Secondary | ICD-10-CM | POA: Diagnosis not present

## 2017-04-22 ENCOUNTER — Other Ambulatory Visit: Payer: Self-pay | Admitting: Cardiology

## 2017-04-22 DIAGNOSIS — Z23 Encounter for immunization: Secondary | ICD-10-CM | POA: Diagnosis not present

## 2017-04-22 DIAGNOSIS — E538 Deficiency of other specified B group vitamins: Secondary | ICD-10-CM | POA: Diagnosis not present

## 2017-05-05 DIAGNOSIS — M1711 Unilateral primary osteoarthritis, right knee: Secondary | ICD-10-CM | POA: Diagnosis not present

## 2017-05-13 ENCOUNTER — Other Ambulatory Visit: Payer: Self-pay | Admitting: Cardiology

## 2017-05-20 ENCOUNTER — Ambulatory Visit (INDEPENDENT_AMBULATORY_CARE_PROVIDER_SITE_OTHER): Payer: Medicare Other | Admitting: *Deleted

## 2017-05-20 DIAGNOSIS — I420 Dilated cardiomyopathy: Secondary | ICD-10-CM | POA: Diagnosis not present

## 2017-05-20 DIAGNOSIS — I5022 Chronic systolic (congestive) heart failure: Secondary | ICD-10-CM | POA: Diagnosis not present

## 2017-05-20 DIAGNOSIS — E538 Deficiency of other specified B group vitamins: Secondary | ICD-10-CM | POA: Diagnosis not present

## 2017-05-20 LAB — CUP PACEART INCLINIC DEVICE CHECK
Battery Remaining Longevity: 51 mo
Battery Voltage: 2.99 V
Brady Statistic AP VP Percent: 0.03 %
Brady Statistic AP VS Percent: 0 %
Brady Statistic AS VP Percent: 99.95 %
Brady Statistic AS VS Percent: 0.03 %
Brady Statistic RA Percent Paced: 0.03 %
Brady Statistic RV Percent Paced: 99.07 %
Date Time Interrogation Session: 20181017171716
Implantable Lead Implant Date: 20131024
Implantable Lead Implant Date: 20131024
Implantable Lead Implant Date: 20131028
Implantable Lead Location: 753858
Implantable Lead Location: 753859
Implantable Lead Location: 753860
Implantable Lead Model: 1944
Implantable Lead Model: 1948
Implantable Lead Model: 4194
Implantable Pulse Generator Implant Date: 20131024
Lead Channel Impedance Value: 342 Ohm
Lead Channel Impedance Value: 456 Ohm
Lead Channel Impedance Value: 456 Ohm
Lead Channel Impedance Value: 475 Ohm
Lead Channel Impedance Value: 494 Ohm
Lead Channel Impedance Value: 532 Ohm
Lead Channel Impedance Value: 627 Ohm
Lead Channel Impedance Value: 627 Ohm
Lead Channel Impedance Value: 665 Ohm
Lead Channel Pacing Threshold Amplitude: 0.625 V
Lead Channel Pacing Threshold Amplitude: 0.875 V
Lead Channel Pacing Threshold Amplitude: 1 V
Lead Channel Pacing Threshold Pulse Width: 0.4 ms
Lead Channel Pacing Threshold Pulse Width: 0.4 ms
Lead Channel Pacing Threshold Pulse Width: 0.4 ms
Lead Channel Sensing Intrinsic Amplitude: 2.25 mV
Lead Channel Sensing Intrinsic Amplitude: 2.25 mV
Lead Channel Sensing Intrinsic Amplitude: 4.75 mV
Lead Channel Sensing Intrinsic Amplitude: 5.625 mV
Lead Channel Setting Pacing Amplitude: 2 V
Lead Channel Setting Pacing Amplitude: 2 V
Lead Channel Setting Pacing Amplitude: 2.5 V
Lead Channel Setting Pacing Pulse Width: 0.4 ms
Lead Channel Setting Pacing Pulse Width: 0.4 ms
Lead Channel Setting Sensing Sensitivity: 2.8 mV

## 2017-05-20 NOTE — Progress Notes (Signed)
CRT-P device check in clinic. Normal device function. Thresholds, sensing, impedance consistent with previous measurements. Histograms appropriate for patient and level of activity. <0.1% AT/AF burden, max dur. 56mins + ASA/Plavix. (2) ventricular high rate episodes. Patient bi-ventricularly pacing 99.1% of the time. Device programmed with appropriate safety margins. Device heart failure diagnostics are within normal limits and stable over time. Estimated longevity 4 years. Patient will follow up with SK in 6 months.

## 2017-05-21 DIAGNOSIS — M6281 Muscle weakness (generalized): Secondary | ICD-10-CM | POA: Diagnosis not present

## 2017-05-21 DIAGNOSIS — R2681 Unsteadiness on feet: Secondary | ICD-10-CM | POA: Diagnosis not present

## 2017-05-21 DIAGNOSIS — R262 Difficulty in walking, not elsewhere classified: Secondary | ICD-10-CM | POA: Diagnosis not present

## 2017-05-26 DIAGNOSIS — R2681 Unsteadiness on feet: Secondary | ICD-10-CM | POA: Diagnosis not present

## 2017-05-26 DIAGNOSIS — R262 Difficulty in walking, not elsewhere classified: Secondary | ICD-10-CM | POA: Diagnosis not present

## 2017-05-26 DIAGNOSIS — M6281 Muscle weakness (generalized): Secondary | ICD-10-CM | POA: Diagnosis not present

## 2017-05-27 DIAGNOSIS — M6281 Muscle weakness (generalized): Secondary | ICD-10-CM | POA: Diagnosis not present

## 2017-05-27 DIAGNOSIS — R262 Difficulty in walking, not elsewhere classified: Secondary | ICD-10-CM | POA: Diagnosis not present

## 2017-05-27 DIAGNOSIS — R2681 Unsteadiness on feet: Secondary | ICD-10-CM | POA: Diagnosis not present

## 2017-05-28 DIAGNOSIS — M6281 Muscle weakness (generalized): Secondary | ICD-10-CM | POA: Diagnosis not present

## 2017-05-28 DIAGNOSIS — R262 Difficulty in walking, not elsewhere classified: Secondary | ICD-10-CM | POA: Diagnosis not present

## 2017-05-28 DIAGNOSIS — R2681 Unsteadiness on feet: Secondary | ICD-10-CM | POA: Diagnosis not present

## 2017-06-02 DIAGNOSIS — M6281 Muscle weakness (generalized): Secondary | ICD-10-CM | POA: Diagnosis not present

## 2017-06-02 DIAGNOSIS — R262 Difficulty in walking, not elsewhere classified: Secondary | ICD-10-CM | POA: Diagnosis not present

## 2017-06-02 DIAGNOSIS — R2681 Unsteadiness on feet: Secondary | ICD-10-CM | POA: Diagnosis not present

## 2017-06-04 DIAGNOSIS — R262 Difficulty in walking, not elsewhere classified: Secondary | ICD-10-CM | POA: Diagnosis not present

## 2017-06-04 DIAGNOSIS — M6281 Muscle weakness (generalized): Secondary | ICD-10-CM | POA: Diagnosis not present

## 2017-06-04 DIAGNOSIS — R2681 Unsteadiness on feet: Secondary | ICD-10-CM | POA: Diagnosis not present

## 2017-06-05 DIAGNOSIS — R262 Difficulty in walking, not elsewhere classified: Secondary | ICD-10-CM | POA: Diagnosis not present

## 2017-06-05 DIAGNOSIS — M6281 Muscle weakness (generalized): Secondary | ICD-10-CM | POA: Diagnosis not present

## 2017-06-05 DIAGNOSIS — R2681 Unsteadiness on feet: Secondary | ICD-10-CM | POA: Diagnosis not present

## 2017-06-09 ENCOUNTER — Encounter: Payer: Self-pay | Admitting: Cardiology

## 2017-06-09 ENCOUNTER — Ambulatory Visit (INDEPENDENT_AMBULATORY_CARE_PROVIDER_SITE_OTHER): Payer: Medicare Other | Admitting: Cardiology

## 2017-06-09 VITALS — BP 130/68 | HR 76 | Ht 64.0 in | Wt 184.6 lb

## 2017-06-09 DIAGNOSIS — I10 Essential (primary) hypertension: Secondary | ICD-10-CM

## 2017-06-09 DIAGNOSIS — E785 Hyperlipidemia, unspecified: Secondary | ICD-10-CM

## 2017-06-09 DIAGNOSIS — M6281 Muscle weakness (generalized): Secondary | ICD-10-CM | POA: Diagnosis not present

## 2017-06-09 DIAGNOSIS — I5022 Chronic systolic (congestive) heart failure: Secondary | ICD-10-CM | POA: Diagnosis not present

## 2017-06-09 DIAGNOSIS — I272 Pulmonary hypertension, unspecified: Secondary | ICD-10-CM

## 2017-06-09 DIAGNOSIS — I2510 Atherosclerotic heart disease of native coronary artery without angina pectoris: Secondary | ICD-10-CM

## 2017-06-09 DIAGNOSIS — R2681 Unsteadiness on feet: Secondary | ICD-10-CM | POA: Diagnosis not present

## 2017-06-09 DIAGNOSIS — R262 Difficulty in walking, not elsewhere classified: Secondary | ICD-10-CM | POA: Diagnosis not present

## 2017-06-09 DIAGNOSIS — I255 Ischemic cardiomyopathy: Secondary | ICD-10-CM

## 2017-06-09 LAB — BASIC METABOLIC PANEL
BUN/Creatinine Ratio: 20 (ref 12–28)
BUN: 27 mg/dL (ref 10–36)
CO2: 26 mmol/L (ref 20–29)
Calcium: 8.7 mg/dL (ref 8.7–10.3)
Chloride: 104 mmol/L (ref 96–106)
Creatinine, Ser: 1.34 mg/dL — ABNORMAL HIGH (ref 0.57–1.00)
GFR calc Af Amer: 40 mL/min/{1.73_m2} — ABNORMAL LOW (ref >59)
GFR calc non Af Amer: 34 mL/min/{1.73_m2} — ABNORMAL LOW (ref >59)
Glucose: 84 mg/dL (ref 65–99)
Potassium: 4 mmol/L (ref 3.5–5.2)
Sodium: 147 mmol/L — ABNORMAL HIGH (ref 134–144)

## 2017-06-09 MED ORDER — NITROGLYCERIN 0.4 MG SL SUBL: 0.4000 mg | SUBLINGUAL_TABLET | SUBLINGUAL | 3 refills | Status: DC | PRN

## 2017-06-09 NOTE — Patient Instructions (Signed)
Medication Instructions:  Your Nitro has been refilled.  Your provider recommends that you continue on your current medications as directed. Please refer to the Current Medication list given to you today.    Labwork: TODAY: BMET  Testing/Procedures: None  Follow-Up: Your provider wants you to follow-up in: 6 months with Dr. Radford Pax. You will receive a reminder letter in the mail two months in advance. If you don't receive a letter, please call our office to schedule the follow-up appointment.    Any Other Special Instructions Will Be Listed Below (If Applicable).     If you need a refill on your cardiac medications before your next appointment, please call your pharmacy.

## 2017-06-09 NOTE — Progress Notes (Signed)
Cardiology Office Note:    Date:  06/09/2017   ID:  [name], DOB 1923-06-21, MRN [id number]  PCP:  Gaynelle Arabian, MD  Cardiologist:  Fransico Him, MD   Referring MD: Gaynelle Arabian, MD   Chief Complaint  Patient presents with  . Coronary Artery Disease  . Hypertension  . Hyperlipidemia  . Congestive Heart Failure    History of Present Illness:    [name] is a 83 y.o. female with a hx of of ASCAD s/p DES to LAD with residual disease of the diag too small for PCI by cath 01/2010 and then repeat cath with restenosis of the LAD s/p PCI 10/2010, chronic stable angina, ischemic DCM ( EF 45% on echo 08/2015), LBBB, BiVPPM, HTN, dyslipidemia and chronic systolic CHF.  She is here today for followup and is doing well.  She has chronic stable angina which has not changed and uses SL NTG once monthly.  She has chronic DOE which is stable and unchanged.  She denies any  PND, orthopnea, dizziness, palpitations or syncope. She has chronic LE edema that is stable.  She is compliant with her meds and is tolerating meds with no SE.      Past Medical History:  Diagnosis Date  . Breast cancer ([place])    bilaterally  . CAD (coronary artery disease)    w/ DES to LAD with residual diagonal disease to small for PCI 6/11 and repeat DES to LAD for restenosis 3/12  . Cardiomyopathy ([place])    Mixed CM; s/p St. Jude CRT-P implant 10/24 w/ LV lead revision 10/25 . Echo 08/2015 with EF 45%.    . Chronic systolic CHF (congestive heart failure) ([place])   . Dyslipidemia, goal LDL below 70   . GERD (gastroesophageal reflux disease)    occ  . Hematuria 02/23/2013   w/clots required hospital admission on 02/23/2013  . Hypertension   . LBBB (left bundle branch block)   . Macular degeneration   . Mild mitral regurgitation   . Osteoarthritis    "right knee" (02/23/2013)  . Osteopenia   . Pacemaker   . Pulmonary HTN ([place])    PASP 37mmHg by echo 11/2016  . Umbilical hernia    Repaired on 01/10/2013   .  Vitamin B12 deficiency     Past Surgical History:  Procedure Laterality Date  . BREAST BIOPSY  1995   bilaterally  . CATARACT EXTRACTION W/ INTRAOCULAR LENS  IMPLANT, BILATERAL  1990's  . CORONARY ANGIOPLASTY WITH STENT PLACEMENT  2010   "1" (05/27/2012)  . HERNIA REPAIR    . INSERT / REPLACE / REMOVE PACEMAKER  05/27/2012   initial placement - BiV PPM  . INSERT / REPLACE / REMOVE PACEMAKER  05/31/2012   "lead change/repaired" (05/31/2012)  . JOINT REPLACEMENT    . MASTECTOMY  1995   bilaterally  . TONSILLECTOMY    . TOTAL HIP ARTHROPLASTY  2011   right    Current Medications: Current Meds  Medication Sig  . aspirin EC 81 MG tablet Take 81 mg by mouth daily.  . carvedilol (COREG) 6.25 MG tablet TAKE ONE TABLET TWICE DAILY  . Cholecalciferol (VITAMIN D-3) 5000 UNITS TABS Take 5,000 Units by mouth daily.   . clopidogrel (PLAVIX) 75 MG tablet TAKE ONE TABLET BY MOUTH ONCE DAILY  . furosemide (LASIX) 40 MG tablet Take 1 tablet (40 mg total) by mouth daily.  Marland Kitchen ibuprofen (ADVIL,MOTRIN) 200 MG tablet Take 200 mg by mouth every 6 (six)  hours as needed.  . isosorbide mononitrate (IMDUR) 60 MG 24 hr tablet TAKE ONE AND ONE-HALF TABLETS DAILY  . nitroGLYCERIN (NITROSTAT) 0.4 MG SL tablet Place 1 tablet (0.4 mg total) every 5 (five) minutes as needed under the tongue for chest pain.  . simvastatin (ZOCOR) 20 MG tablet TAKE ONE TABLET EACH DAY AT 6PM  . [DISCONTINUED] nitroGLYCERIN (NITROSTAT) 0.4 MG SL tablet Place 0.4 mg under the tongue every 5 (five) minutes as needed for chest pain.      Allergies:   Altace [ramipril] and Celebrex [celecoxib]   Social History   Socioeconomic History  . Marital status: Widowed    Spouse name: None  . Number of children: None  . Years of education: None  . Highest education level: None  Social Needs  . Financial resource strain: None  . Food insecurity - worry: None  . Food insecurity - inability: None  . Transportation needs - medical: None   . Transportation needs - non-medical: None  Occupational History  . None  Tobacco Use  . Smoking status: Former Smoker    Packs/day: 1.00    Years: 20.00    Pack years: 20.00    Types: Cigarettes    Start date: 03/17/1943    Last attempt to quit: 01/05/1963    Years since quitting: 54.4  . Smokeless tobacco: Never Used  Substance and Sexual Activity  . Alcohol use: Yes    Alcohol/week: 3.0 oz    Types: 5 Glasses of wine per week    Comment: 02/23/2013 "glass of wine ~ 5X/wk":  . Drug use: No  . Sexual activity: Not Currently  Other Topics Concern  . None  Social History Narrative   Originally from [place], [place]. Previously lived in [place], [place]. Graduated college with a business degree (BS in [employer]). Currently enjoys reading. Previously enjoyed sewing and knitting. Previously also enjoyed water skiing. Previously worked as a Network engineer. Husband was a Personnel officer. Also previously worked for the [employer] for a year. 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 Doesn't have down pillows. No mold or asbestos exposure.      Family History: The patient's family history includes Asthma in her grandchild; Breast cancer in her daughter, maternal aunt, maternal aunt, and mother; Heart disease in her father and mother.  ROS:   Please see the history of present illness.    ROS  All other systems reviewed and negative.   EKGs/Labs/Other Studies Reviewed:    The following studies were reviewed today: none  EKG:  EKG is not ordered today.    Recent Labs: 07/23/2016: Hemoglobin 10.6; Platelets 223.0 12/02/2016: ALT 7; BUN 33; Creatinine, Ser 1.45; Potassium 4.5; Sodium 144   Recent Lipid Panel    Component Value Date/Time   CHOL 122 12/02/2016 1220   TRIG 86 12/02/2016 1220   HDL 54 12/02/2016 1220   CHOLHDL 2.3 12/02/2016 1220   CHOLHDL 1.9 11/27/2015 1042   VLDL 11 11/27/2015 1042   LDLCALC 51 12/02/2016 1220    Physical Exam:    VS:   BP 130/68   Pulse 76   Ht 5\' 4"  (1.626 m)   Wt 184 lb 9.6 oz (83.7 kg)   SpO2 98%   BMI 31.69 kg/m     Wt Readings from Last 3 Encounters:  06/09/17 184 lb 9.6 oz (83.7 kg)  11/24/16 176 lb 6.4 oz (80 kg)  11/17/16 180 lb (81.6 kg)     GEN:  Well nourished, well  developed in no acute distress HEENT: Normal NECK: No JVD; No carotid bruits LYMPHATICS: No lymphadenopathy CARDIAC: RRR, no murmurs, rubs, gallops RESPIRATORY:  Clear to auscultation without rales, wheezing or rhonchi  ABDOMEN: Soft, non-tender, non-distended MUSCULOSKELETAL:  Trace edema; No deformity  SKIN: Warm and dry NEUROLOGIC:  Alert and oriented x 3 PSYCHIATRIC:  Normal affect   ASSESSMENT:    1. Chronic systolic congestive heart failure ([place])   2. Coronary artery disease involving native coronary artery of native heart without angina pectoris   3. Benign essential HTN   4. Pulmonary HTN ([place])   5. Dyslipidemia    PLAN:    In order of problems listed above:  1.  Chronic systolic heart failure-she appears euvolemic on exam today although her weight is up some from April about 4 pounds.  She has chronic lower extremity edema which she thinks is stable and does appear to be stable on exam.  She has not had any significant dyspnea on exertion assertion other than what she has had before.  Her LV function has normalized with last EF 55-60% on echo April 2018.  She will continue on Lasix 40 mg daily and I will check a bmet.  She will also continue on carvedilol 6.25 mg twice daily.  2.  Coronary artery disease with chronic stable angina -  s/p DES to LAD with residual disease of the diag too small for PCI by cath 01/2010 and then repeat cath with restenosis of the LAD s/p PCI 10/2010.  She only has to use a sublingual nitroglycerin once monthly.  She will continue on aspirin 81 mg daily, carvedilol 6.25 mg twice daily, Imdur 90 mg daily, simvastatin 20 mg daily.  3.  Hypertension-blood pressure well controlled on  exam today.  She will continue on carvedilol 6.25 mg twice daily.  4.  Pulmonary hypertension-this is mild at 41 mmHg by echo April 2018.  5.  Hyperlipidemia with LDL goal less than 70.  Her last LDL was at goal at 51 in May 2018.  She will continue on current dose of simvastatin.  Medication Adjustments/Labs and Tests Ordered: Current medicines are reviewed at length with the patient today.  Concerns regarding medicines are outlined above.  Orders Placed This Encounter  Procedures  . Basic metabolic panel   Meds ordered this encounter  Medications  . nitroGLYCERIN (NITROSTAT) 0.4 MG SL tablet    Sig: Place 1 tablet (0.4 mg total) every 5 (five) minutes as needed under the tongue for chest pain.    Dispense:  25 tablet    Refill:  3    Signed, Fransico Him, MD  06/09/2017 1:08 PM    [facility]

## 2017-06-10 ENCOUNTER — Telehealth: Payer: Self-pay

## 2017-06-10 NOTE — Telephone Encounter (Signed)
Spoke with patient and gave results of recent lab results with verbal understanding.  

## 2017-06-11 DIAGNOSIS — M6281 Muscle weakness (generalized): Secondary | ICD-10-CM | POA: Diagnosis not present

## 2017-06-11 DIAGNOSIS — R262 Difficulty in walking, not elsewhere classified: Secondary | ICD-10-CM | POA: Diagnosis not present

## 2017-06-11 DIAGNOSIS — R2681 Unsteadiness on feet: Secondary | ICD-10-CM | POA: Diagnosis not present

## 2017-06-12 DIAGNOSIS — M6281 Muscle weakness (generalized): Secondary | ICD-10-CM | POA: Diagnosis not present

## 2017-06-12 DIAGNOSIS — R262 Difficulty in walking, not elsewhere classified: Secondary | ICD-10-CM | POA: Diagnosis not present

## 2017-06-12 DIAGNOSIS — R2681 Unsteadiness on feet: Secondary | ICD-10-CM | POA: Diagnosis not present

## 2017-06-15 DIAGNOSIS — R2681 Unsteadiness on feet: Secondary | ICD-10-CM | POA: Diagnosis not present

## 2017-06-15 DIAGNOSIS — M6281 Muscle weakness (generalized): Secondary | ICD-10-CM | POA: Diagnosis not present

## 2017-06-15 DIAGNOSIS — R262 Difficulty in walking, not elsewhere classified: Secondary | ICD-10-CM | POA: Diagnosis not present

## 2017-06-16 DIAGNOSIS — E538 Deficiency of other specified B group vitamins: Secondary | ICD-10-CM | POA: Diagnosis not present

## 2017-06-17 DIAGNOSIS — M6281 Muscle weakness (generalized): Secondary | ICD-10-CM | POA: Diagnosis not present

## 2017-06-17 DIAGNOSIS — R2681 Unsteadiness on feet: Secondary | ICD-10-CM | POA: Diagnosis not present

## 2017-06-17 DIAGNOSIS — R262 Difficulty in walking, not elsewhere classified: Secondary | ICD-10-CM | POA: Diagnosis not present

## 2017-06-18 DIAGNOSIS — R262 Difficulty in walking, not elsewhere classified: Secondary | ICD-10-CM | POA: Diagnosis not present

## 2017-06-18 DIAGNOSIS — R2681 Unsteadiness on feet: Secondary | ICD-10-CM | POA: Diagnosis not present

## 2017-06-18 DIAGNOSIS — M6281 Muscle weakness (generalized): Secondary | ICD-10-CM | POA: Diagnosis not present

## 2017-06-30 DIAGNOSIS — R2681 Unsteadiness on feet: Secondary | ICD-10-CM | POA: Diagnosis not present

## 2017-06-30 DIAGNOSIS — M6281 Muscle weakness (generalized): Secondary | ICD-10-CM | POA: Diagnosis not present

## 2017-06-30 DIAGNOSIS — R262 Difficulty in walking, not elsewhere classified: Secondary | ICD-10-CM | POA: Diagnosis not present

## 2017-07-01 DIAGNOSIS — R2681 Unsteadiness on feet: Secondary | ICD-10-CM | POA: Diagnosis not present

## 2017-07-01 DIAGNOSIS — R262 Difficulty in walking, not elsewhere classified: Secondary | ICD-10-CM | POA: Diagnosis not present

## 2017-07-01 DIAGNOSIS — M6281 Muscle weakness (generalized): Secondary | ICD-10-CM | POA: Diagnosis not present

## 2017-07-03 DIAGNOSIS — M6281 Muscle weakness (generalized): Secondary | ICD-10-CM | POA: Diagnosis not present

## 2017-07-03 DIAGNOSIS — I209 Angina pectoris, unspecified: Secondary | ICD-10-CM | POA: Diagnosis not present

## 2017-07-03 DIAGNOSIS — R262 Difficulty in walking, not elsewhere classified: Secondary | ICD-10-CM | POA: Diagnosis not present

## 2017-07-03 DIAGNOSIS — M1990 Unspecified osteoarthritis, unspecified site: Secondary | ICD-10-CM | POA: Diagnosis not present

## 2017-07-03 DIAGNOSIS — Z853 Personal history of malignant neoplasm of breast: Secondary | ICD-10-CM | POA: Diagnosis not present

## 2017-07-03 DIAGNOSIS — N183 Chronic kidney disease, stage 3 (moderate): Secondary | ICD-10-CM | POA: Diagnosis not present

## 2017-07-03 DIAGNOSIS — I2510 Atherosclerotic heart disease of native coronary artery without angina pectoris: Secondary | ICD-10-CM | POA: Diagnosis not present

## 2017-07-03 DIAGNOSIS — F322 Major depressive disorder, single episode, severe without psychotic features: Secondary | ICD-10-CM | POA: Diagnosis not present

## 2017-07-03 DIAGNOSIS — I255 Ischemic cardiomyopathy: Secondary | ICD-10-CM | POA: Diagnosis not present

## 2017-07-03 DIAGNOSIS — I5020 Unspecified systolic (congestive) heart failure: Secondary | ICD-10-CM | POA: Diagnosis not present

## 2017-07-03 DIAGNOSIS — I208 Other forms of angina pectoris: Secondary | ICD-10-CM | POA: Diagnosis not present

## 2017-07-03 DIAGNOSIS — R2681 Unsteadiness on feet: Secondary | ICD-10-CM | POA: Diagnosis not present

## 2017-07-07 DIAGNOSIS — M6281 Muscle weakness (generalized): Secondary | ICD-10-CM | POA: Diagnosis not present

## 2017-07-07 DIAGNOSIS — R262 Difficulty in walking, not elsewhere classified: Secondary | ICD-10-CM | POA: Diagnosis not present

## 2017-07-07 DIAGNOSIS — R2681 Unsteadiness on feet: Secondary | ICD-10-CM | POA: Diagnosis not present

## 2017-07-09 DIAGNOSIS — R262 Difficulty in walking, not elsewhere classified: Secondary | ICD-10-CM | POA: Diagnosis not present

## 2017-07-09 DIAGNOSIS — R2681 Unsteadiness on feet: Secondary | ICD-10-CM | POA: Diagnosis not present

## 2017-07-09 DIAGNOSIS — M6281 Muscle weakness (generalized): Secondary | ICD-10-CM | POA: Diagnosis not present

## 2017-07-10 DIAGNOSIS — R262 Difficulty in walking, not elsewhere classified: Secondary | ICD-10-CM | POA: Diagnosis not present

## 2017-07-10 DIAGNOSIS — R2681 Unsteadiness on feet: Secondary | ICD-10-CM | POA: Diagnosis not present

## 2017-07-10 DIAGNOSIS — M6281 Muscle weakness (generalized): Secondary | ICD-10-CM | POA: Diagnosis not present

## 2017-07-13 DIAGNOSIS — R2681 Unsteadiness on feet: Secondary | ICD-10-CM | POA: Diagnosis not present

## 2017-07-13 DIAGNOSIS — M6281 Muscle weakness (generalized): Secondary | ICD-10-CM | POA: Diagnosis not present

## 2017-07-13 DIAGNOSIS — R262 Difficulty in walking, not elsewhere classified: Secondary | ICD-10-CM | POA: Diagnosis not present

## 2017-07-14 DIAGNOSIS — M6281 Muscle weakness (generalized): Secondary | ICD-10-CM | POA: Diagnosis not present

## 2017-07-14 DIAGNOSIS — R262 Difficulty in walking, not elsewhere classified: Secondary | ICD-10-CM | POA: Diagnosis not present

## 2017-07-14 DIAGNOSIS — R2681 Unsteadiness on feet: Secondary | ICD-10-CM | POA: Diagnosis not present

## 2017-07-16 DIAGNOSIS — N183 Chronic kidney disease, stage 3 (moderate): Secondary | ICD-10-CM | POA: Diagnosis not present

## 2017-07-16 DIAGNOSIS — Z853 Personal history of malignant neoplasm of breast: Secondary | ICD-10-CM | POA: Diagnosis not present

## 2017-07-16 DIAGNOSIS — I5020 Unspecified systolic (congestive) heart failure: Secondary | ICD-10-CM | POA: Diagnosis not present

## 2017-07-16 DIAGNOSIS — I208 Other forms of angina pectoris: Secondary | ICD-10-CM | POA: Diagnosis not present

## 2017-07-16 DIAGNOSIS — I209 Angina pectoris, unspecified: Secondary | ICD-10-CM | POA: Diagnosis not present

## 2017-07-16 DIAGNOSIS — I255 Ischemic cardiomyopathy: Secondary | ICD-10-CM | POA: Diagnosis not present

## 2017-07-16 DIAGNOSIS — I2510 Atherosclerotic heart disease of native coronary artery without angina pectoris: Secondary | ICD-10-CM | POA: Diagnosis not present

## 2017-07-16 DIAGNOSIS — M1990 Unspecified osteoarthritis, unspecified site: Secondary | ICD-10-CM | POA: Diagnosis not present

## 2017-07-16 DIAGNOSIS — F322 Major depressive disorder, single episode, severe without psychotic features: Secondary | ICD-10-CM | POA: Diagnosis not present

## 2017-07-21 DIAGNOSIS — R2681 Unsteadiness on feet: Secondary | ICD-10-CM | POA: Diagnosis not present

## 2017-07-21 DIAGNOSIS — R262 Difficulty in walking, not elsewhere classified: Secondary | ICD-10-CM | POA: Diagnosis not present

## 2017-07-21 DIAGNOSIS — M6281 Muscle weakness (generalized): Secondary | ICD-10-CM | POA: Diagnosis not present

## 2017-07-22 DIAGNOSIS — D692 Other nonthrombocytopenic purpura: Secondary | ICD-10-CM | POA: Diagnosis not present

## 2017-07-22 DIAGNOSIS — I2510 Atherosclerotic heart disease of native coronary artery without angina pectoris: Secondary | ICD-10-CM | POA: Diagnosis not present

## 2017-07-22 DIAGNOSIS — H3530 Unspecified macular degeneration: Secondary | ICD-10-CM | POA: Diagnosis not present

## 2017-07-22 DIAGNOSIS — Z23 Encounter for immunization: Secondary | ICD-10-CM | POA: Diagnosis not present

## 2017-07-22 DIAGNOSIS — I5020 Unspecified systolic (congestive) heart failure: Secondary | ICD-10-CM | POA: Diagnosis not present

## 2017-07-22 DIAGNOSIS — E538 Deficiency of other specified B group vitamins: Secondary | ICD-10-CM | POA: Diagnosis not present

## 2017-07-22 DIAGNOSIS — I255 Ischemic cardiomyopathy: Secondary | ICD-10-CM | POA: Diagnosis not present

## 2017-07-22 DIAGNOSIS — H353210 Exudative age-related macular degeneration, right eye, stage unspecified: Secondary | ICD-10-CM | POA: Diagnosis not present

## 2017-07-22 DIAGNOSIS — F322 Major depressive disorder, single episode, severe without psychotic features: Secondary | ICD-10-CM | POA: Diagnosis not present

## 2017-07-22 DIAGNOSIS — Z950 Presence of cardiac pacemaker: Secondary | ICD-10-CM | POA: Diagnosis not present

## 2017-07-22 DIAGNOSIS — M859 Disorder of bone density and structure, unspecified: Secondary | ICD-10-CM | POA: Diagnosis not present

## 2017-07-22 DIAGNOSIS — I208 Other forms of angina pectoris: Secondary | ICD-10-CM | POA: Diagnosis not present

## 2017-07-23 DIAGNOSIS — M6281 Muscle weakness (generalized): Secondary | ICD-10-CM | POA: Diagnosis not present

## 2017-07-23 DIAGNOSIS — R2681 Unsteadiness on feet: Secondary | ICD-10-CM | POA: Diagnosis not present

## 2017-07-23 DIAGNOSIS — R262 Difficulty in walking, not elsewhere classified: Secondary | ICD-10-CM | POA: Diagnosis not present

## 2017-07-30 DIAGNOSIS — R262 Difficulty in walking, not elsewhere classified: Secondary | ICD-10-CM | POA: Diagnosis not present

## 2017-07-30 DIAGNOSIS — R2681 Unsteadiness on feet: Secondary | ICD-10-CM | POA: Diagnosis not present

## 2017-07-30 DIAGNOSIS — M6281 Muscle weakness (generalized): Secondary | ICD-10-CM | POA: Diagnosis not present

## 2017-07-31 DIAGNOSIS — R05 Cough: Secondary | ICD-10-CM | POA: Diagnosis not present

## 2017-07-31 DIAGNOSIS — J069 Acute upper respiratory infection, unspecified: Secondary | ICD-10-CM | POA: Diagnosis not present

## 2017-07-31 DIAGNOSIS — J9801 Acute bronchospasm: Secondary | ICD-10-CM | POA: Diagnosis not present

## 2017-08-05 DIAGNOSIS — R262 Difficulty in walking, not elsewhere classified: Secondary | ICD-10-CM | POA: Diagnosis not present

## 2017-08-05 DIAGNOSIS — M6281 Muscle weakness (generalized): Secondary | ICD-10-CM | POA: Diagnosis not present

## 2017-08-05 DIAGNOSIS — R2681 Unsteadiness on feet: Secondary | ICD-10-CM | POA: Diagnosis not present

## 2017-08-12 DIAGNOSIS — M6281 Muscle weakness (generalized): Secondary | ICD-10-CM | POA: Diagnosis not present

## 2017-08-12 DIAGNOSIS — R262 Difficulty in walking, not elsewhere classified: Secondary | ICD-10-CM | POA: Diagnosis not present

## 2017-08-12 DIAGNOSIS — R2681 Unsteadiness on feet: Secondary | ICD-10-CM | POA: Diagnosis not present

## 2017-08-17 DIAGNOSIS — R2681 Unsteadiness on feet: Secondary | ICD-10-CM | POA: Diagnosis not present

## 2017-08-17 DIAGNOSIS — M6281 Muscle weakness (generalized): Secondary | ICD-10-CM | POA: Diagnosis not present

## 2017-08-17 DIAGNOSIS — R262 Difficulty in walking, not elsewhere classified: Secondary | ICD-10-CM | POA: Diagnosis not present

## 2017-08-19 DIAGNOSIS — R2681 Unsteadiness on feet: Secondary | ICD-10-CM | POA: Diagnosis not present

## 2017-08-19 DIAGNOSIS — M6281 Muscle weakness (generalized): Secondary | ICD-10-CM | POA: Diagnosis not present

## 2017-08-19 DIAGNOSIS — R262 Difficulty in walking, not elsewhere classified: Secondary | ICD-10-CM | POA: Diagnosis not present

## 2017-08-25 DIAGNOSIS — E538 Deficiency of other specified B group vitamins: Secondary | ICD-10-CM | POA: Diagnosis not present

## 2017-08-26 DIAGNOSIS — M6281 Muscle weakness (generalized): Secondary | ICD-10-CM | POA: Diagnosis not present

## 2017-08-26 DIAGNOSIS — R262 Difficulty in walking, not elsewhere classified: Secondary | ICD-10-CM | POA: Diagnosis not present

## 2017-08-26 DIAGNOSIS — R2681 Unsteadiness on feet: Secondary | ICD-10-CM | POA: Diagnosis not present

## 2017-08-28 DIAGNOSIS — M6281 Muscle weakness (generalized): Secondary | ICD-10-CM | POA: Diagnosis not present

## 2017-08-28 DIAGNOSIS — R262 Difficulty in walking, not elsewhere classified: Secondary | ICD-10-CM | POA: Diagnosis not present

## 2017-08-28 DIAGNOSIS — R2681 Unsteadiness on feet: Secondary | ICD-10-CM | POA: Diagnosis not present

## 2017-09-01 DIAGNOSIS — R2681 Unsteadiness on feet: Secondary | ICD-10-CM | POA: Diagnosis not present

## 2017-09-01 DIAGNOSIS — R262 Difficulty in walking, not elsewhere classified: Secondary | ICD-10-CM | POA: Diagnosis not present

## 2017-09-01 DIAGNOSIS — M6281 Muscle weakness (generalized): Secondary | ICD-10-CM | POA: Diagnosis not present

## 2017-09-02 DIAGNOSIS — M6281 Muscle weakness (generalized): Secondary | ICD-10-CM | POA: Diagnosis not present

## 2017-09-02 DIAGNOSIS — R262 Difficulty in walking, not elsewhere classified: Secondary | ICD-10-CM | POA: Diagnosis not present

## 2017-09-02 DIAGNOSIS — R2681 Unsteadiness on feet: Secondary | ICD-10-CM | POA: Diagnosis not present

## 2017-09-08 DIAGNOSIS — R262 Difficulty in walking, not elsewhere classified: Secondary | ICD-10-CM | POA: Diagnosis not present

## 2017-09-08 DIAGNOSIS — R2681 Unsteadiness on feet: Secondary | ICD-10-CM | POA: Diagnosis not present

## 2017-09-08 DIAGNOSIS — M6281 Muscle weakness (generalized): Secondary | ICD-10-CM | POA: Diagnosis not present

## 2017-09-09 DIAGNOSIS — M6281 Muscle weakness (generalized): Secondary | ICD-10-CM | POA: Diagnosis not present

## 2017-09-09 DIAGNOSIS — R2681 Unsteadiness on feet: Secondary | ICD-10-CM | POA: Diagnosis not present

## 2017-09-09 DIAGNOSIS — R262 Difficulty in walking, not elsewhere classified: Secondary | ICD-10-CM | POA: Diagnosis not present

## 2017-09-11 DIAGNOSIS — R262 Difficulty in walking, not elsewhere classified: Secondary | ICD-10-CM | POA: Diagnosis not present

## 2017-09-11 DIAGNOSIS — M6281 Muscle weakness (generalized): Secondary | ICD-10-CM | POA: Diagnosis not present

## 2017-09-11 DIAGNOSIS — R2681 Unsteadiness on feet: Secondary | ICD-10-CM | POA: Diagnosis not present

## 2017-09-16 DIAGNOSIS — M6281 Muscle weakness (generalized): Secondary | ICD-10-CM | POA: Diagnosis not present

## 2017-09-16 DIAGNOSIS — R2681 Unsteadiness on feet: Secondary | ICD-10-CM | POA: Diagnosis not present

## 2017-09-16 DIAGNOSIS — R262 Difficulty in walking, not elsewhere classified: Secondary | ICD-10-CM | POA: Diagnosis not present

## 2017-09-18 DIAGNOSIS — M6281 Muscle weakness (generalized): Secondary | ICD-10-CM | POA: Diagnosis not present

## 2017-09-18 DIAGNOSIS — R2681 Unsteadiness on feet: Secondary | ICD-10-CM | POA: Diagnosis not present

## 2017-09-18 DIAGNOSIS — R262 Difficulty in walking, not elsewhere classified: Secondary | ICD-10-CM | POA: Diagnosis not present

## 2017-09-22 DIAGNOSIS — E538 Deficiency of other specified B group vitamins: Secondary | ICD-10-CM | POA: Diagnosis not present

## 2017-09-23 DIAGNOSIS — R2681 Unsteadiness on feet: Secondary | ICD-10-CM | POA: Diagnosis not present

## 2017-09-23 DIAGNOSIS — R262 Difficulty in walking, not elsewhere classified: Secondary | ICD-10-CM | POA: Diagnosis not present

## 2017-09-23 DIAGNOSIS — M6281 Muscle weakness (generalized): Secondary | ICD-10-CM | POA: Diagnosis not present

## 2017-09-25 DIAGNOSIS — M6281 Muscle weakness (generalized): Secondary | ICD-10-CM | POA: Diagnosis not present

## 2017-09-25 DIAGNOSIS — R2681 Unsteadiness on feet: Secondary | ICD-10-CM | POA: Diagnosis not present

## 2017-09-25 DIAGNOSIS — R262 Difficulty in walking, not elsewhere classified: Secondary | ICD-10-CM | POA: Diagnosis not present

## 2017-09-29 DIAGNOSIS — M6281 Muscle weakness (generalized): Secondary | ICD-10-CM | POA: Diagnosis not present

## 2017-09-29 DIAGNOSIS — R262 Difficulty in walking, not elsewhere classified: Secondary | ICD-10-CM | POA: Diagnosis not present

## 2017-09-29 DIAGNOSIS — R2681 Unsteadiness on feet: Secondary | ICD-10-CM | POA: Diagnosis not present

## 2017-10-02 DIAGNOSIS — M6281 Muscle weakness (generalized): Secondary | ICD-10-CM | POA: Diagnosis not present

## 2017-10-02 DIAGNOSIS — R262 Difficulty in walking, not elsewhere classified: Secondary | ICD-10-CM | POA: Diagnosis not present

## 2017-10-02 DIAGNOSIS — R2681 Unsteadiness on feet: Secondary | ICD-10-CM | POA: Diagnosis not present

## 2017-10-05 DIAGNOSIS — R2681 Unsteadiness on feet: Secondary | ICD-10-CM | POA: Diagnosis not present

## 2017-10-05 DIAGNOSIS — M6281 Muscle weakness (generalized): Secondary | ICD-10-CM | POA: Diagnosis not present

## 2017-10-05 DIAGNOSIS — R262 Difficulty in walking, not elsewhere classified: Secondary | ICD-10-CM | POA: Diagnosis not present

## 2017-10-06 ENCOUNTER — Emergency Department (HOSPITAL_COMMUNITY): Payer: Medicare Other

## 2017-10-06 ENCOUNTER — Encounter (HOSPITAL_COMMUNITY): Payer: Self-pay | Admitting: Emergency Medicine

## 2017-10-06 ENCOUNTER — Inpatient Hospital Stay (HOSPITAL_COMMUNITY)
Admission: EM | Admit: 2017-10-06 | Discharge: 2017-10-12 | DRG: 470 | Disposition: A | Discharge status: Skilled Nursing Facility | Payer: Medicare Other | Attending: Internal Medicine | Admitting: Internal Medicine

## 2017-10-06 DIAGNOSIS — N179 Acute kidney failure, unspecified: Secondary | ICD-10-CM

## 2017-10-06 DIAGNOSIS — W010XXA Fall on same level from slipping, tripping and stumbling without subsequent striking against object, initial encounter: Secondary | ICD-10-CM | POA: Diagnosis present

## 2017-10-06 DIAGNOSIS — N183 Chronic kidney disease, stage 3 unspecified: Secondary | ICD-10-CM | POA: Diagnosis present

## 2017-10-06 DIAGNOSIS — M25552 Pain in left hip: Secondary | ICD-10-CM | POA: Diagnosis not present

## 2017-10-06 DIAGNOSIS — D539 Nutritional anemia, unspecified: Secondary | ICD-10-CM | POA: Diagnosis present

## 2017-10-06 DIAGNOSIS — I272 Pulmonary hypertension, unspecified: Secondary | ICD-10-CM | POA: Diagnosis present

## 2017-10-06 DIAGNOSIS — S72002A Fracture of unspecified part of neck of left femur, initial encounter for closed fracture: Principal | ICD-10-CM

## 2017-10-06 DIAGNOSIS — Z803 Family history of malignant neoplasm of breast: Secondary | ICD-10-CM

## 2017-10-06 DIAGNOSIS — D62 Acute posthemorrhagic anemia: Secondary | ICD-10-CM | POA: Diagnosis not present

## 2017-10-06 DIAGNOSIS — Z9842 Cataract extraction status, left eye: Secondary | ICD-10-CM

## 2017-10-06 DIAGNOSIS — Z96641 Presence of right artificial hip joint: Secondary | ICD-10-CM | POA: Diagnosis present

## 2017-10-06 DIAGNOSIS — Z853 Personal history of malignant neoplasm of breast: Secondary | ICD-10-CM

## 2017-10-06 DIAGNOSIS — I472 Ventricular tachycardia: Secondary | ICD-10-CM | POA: Diagnosis not present

## 2017-10-06 DIAGNOSIS — S72041A Displaced fracture of base of neck of right femur, initial encounter for closed fracture: Secondary | ICD-10-CM | POA: Diagnosis not present

## 2017-10-06 DIAGNOSIS — I25118 Atherosclerotic heart disease of native coronary artery with other forms of angina pectoris: Secondary | ICD-10-CM | POA: Diagnosis present

## 2017-10-06 DIAGNOSIS — Z955 Presence of coronary angioplasty implant and graft: Secondary | ICD-10-CM

## 2017-10-06 DIAGNOSIS — Z7902 Long term (current) use of antithrombotics/antiplatelets: Secondary | ICD-10-CM

## 2017-10-06 DIAGNOSIS — E876 Hypokalemia: Secondary | ICD-10-CM | POA: Diagnosis not present

## 2017-10-06 DIAGNOSIS — W19XXXA Unspecified fall, initial encounter: Secondary | ICD-10-CM

## 2017-10-06 DIAGNOSIS — J90 Pleural effusion, not elsewhere classified: Secondary | ICD-10-CM | POA: Diagnosis not present

## 2017-10-06 DIAGNOSIS — I2510 Atherosclerotic heart disease of native coronary artery without angina pectoris: Secondary | ICD-10-CM | POA: Diagnosis present

## 2017-10-06 DIAGNOSIS — M8580 Other specified disorders of bone density and structure, unspecified site: Secondary | ICD-10-CM | POA: Diagnosis present

## 2017-10-06 DIAGNOSIS — Z87891 Personal history of nicotine dependence: Secondary | ICD-10-CM

## 2017-10-06 DIAGNOSIS — Z66 Do not resuscitate: Secondary | ICD-10-CM | POA: Diagnosis present

## 2017-10-06 DIAGNOSIS — T148XXA Other injury of unspecified body region, initial encounter: Secondary | ICD-10-CM | POA: Diagnosis not present

## 2017-10-06 DIAGNOSIS — Z961 Presence of intraocular lens: Secondary | ICD-10-CM | POA: Diagnosis present

## 2017-10-06 DIAGNOSIS — K219 Gastro-esophageal reflux disease without esophagitis: Secondary | ICD-10-CM | POA: Diagnosis present

## 2017-10-06 DIAGNOSIS — I130 Hypertensive heart and chronic kidney disease with heart failure and stage 1 through stage 4 chronic kidney disease, or unspecified chronic kidney disease: Secondary | ICD-10-CM | POA: Diagnosis present

## 2017-10-06 DIAGNOSIS — Z96642 Presence of left artificial hip joint: Secondary | ICD-10-CM

## 2017-10-06 DIAGNOSIS — E785 Hyperlipidemia, unspecified: Secondary | ICD-10-CM | POA: Diagnosis present

## 2017-10-06 DIAGNOSIS — I447 Left bundle-branch block, unspecified: Secondary | ICD-10-CM | POA: Diagnosis present

## 2017-10-06 DIAGNOSIS — R8271 Bacteriuria: Secondary | ICD-10-CM | POA: Diagnosis not present

## 2017-10-06 DIAGNOSIS — Z888 Allergy status to other drugs, medicaments and biological substances status: Secondary | ICD-10-CM

## 2017-10-06 DIAGNOSIS — M1990 Unspecified osteoarthritis, unspecified site: Secondary | ICD-10-CM | POA: Diagnosis present

## 2017-10-06 DIAGNOSIS — Z9841 Cataract extraction status, right eye: Secondary | ICD-10-CM

## 2017-10-06 DIAGNOSIS — I5032 Chronic diastolic (congestive) heart failure: Secondary | ICD-10-CM | POA: Diagnosis present

## 2017-10-06 DIAGNOSIS — I10 Essential (primary) hypertension: Secondary | ICD-10-CM | POA: Diagnosis present

## 2017-10-06 DIAGNOSIS — Y92009 Unspecified place in unspecified non-institutional (private) residence as the place of occurrence of the external cause: Secondary | ICD-10-CM

## 2017-10-06 DIAGNOSIS — R05 Cough: Secondary | ICD-10-CM

## 2017-10-06 DIAGNOSIS — I5042 Chronic combined systolic (congestive) and diastolic (congestive) heart failure: Secondary | ICD-10-CM | POA: Diagnosis present

## 2017-10-06 DIAGNOSIS — Z950 Presence of cardiac pacemaker: Secondary | ICD-10-CM | POA: Diagnosis present

## 2017-10-06 DIAGNOSIS — I959 Hypotension, unspecified: Secondary | ICD-10-CM | POA: Diagnosis not present

## 2017-10-06 DIAGNOSIS — R059 Cough, unspecified: Secondary | ICD-10-CM

## 2017-10-06 LAB — CBC WITH DIFFERENTIAL/PLATELET
Basophils Absolute: 0 10*3/uL (ref 0.0–0.1)
Basophils Relative: 0 %
Eosinophils Absolute: 0.1 10*3/uL (ref 0.0–0.7)
Eosinophils Relative: 1 %
HCT: 33.6 % — ABNORMAL LOW (ref 36.0–46.0)
Hemoglobin: 10.4 g/dL — ABNORMAL LOW (ref 12.0–15.0)
Lymphocytes Relative: 11 %
Lymphs Abs: 1 10*3/uL (ref 0.7–4.0)
MCH: 31.4 pg (ref 26.0–34.0)
MCHC: 31 g/dL (ref 30.0–36.0)
MCV: 101.5 fL — ABNORMAL HIGH (ref 78.0–100.0)
Monocytes Absolute: 0.6 10*3/uL (ref 0.1–1.0)
Monocytes Relative: 6 %
Neutro Abs: 7.7 10*3/uL (ref 1.7–7.7)
Neutrophils Relative %: 82 %
Platelets: 191 10*3/uL (ref 150–400)
RBC: 3.31 MIL/uL — ABNORMAL LOW (ref 3.87–5.11)
RDW: 14.4 % (ref 11.5–15.5)
WBC: 9.4 10*3/uL (ref 4.0–10.5)

## 2017-10-06 LAB — COMPREHENSIVE METABOLIC PANEL
ALT: 14 U/L (ref 14–54)
AST: 19 U/L (ref 15–41)
Albumin: 3.4 g/dL — ABNORMAL LOW (ref 3.5–5.0)
Alkaline Phosphatase: 97 U/L (ref 38–126)
Anion gap: 12 (ref 5–15)
BUN: 22 mg/dL — ABNORMAL HIGH (ref 6–20)
CO2: 28 mmol/L (ref 22–32)
Calcium: 9 mg/dL (ref 8.9–10.3)
Chloride: 101 mmol/L (ref 101–111)
Creatinine, Ser: 1.24 mg/dL — ABNORMAL HIGH (ref 0.44–1.00)
GFR calc Af Amer: 42 mL/min — ABNORMAL LOW (ref >60)
GFR calc non Af Amer: 37 mL/min — ABNORMAL LOW (ref >60)
Glucose, Bld: 117 mg/dL — ABNORMAL HIGH (ref 65–99)
Potassium: 3.5 mmol/L (ref 3.5–5.1)
Sodium: 141 mmol/L (ref 135–145)
Total Bilirubin: 0.8 mg/dL (ref 0.3–1.2)
Total Protein: 6.1 g/dL — ABNORMAL LOW (ref 6.5–8.1)

## 2017-10-06 LAB — PROTIME-INR
INR: 1.06
Prothrombin Time: 13.7 seconds (ref 11.4–15.2)

## 2017-10-06 MED ORDER — SODIUM CHLORIDE 0.9 % IV SOLN
INTRAVENOUS | Status: DC
  Administered 2017-10-06 – 2017-10-11 (×2): via INTRAVENOUS

## 2017-10-06 MED ORDER — FENTANYL CITRATE (PF) 100 MCG/2ML IJ SOLN
50.0000 ug | Freq: Once | INTRAMUSCULAR | Status: AC
  Administered 2017-10-06: 50 ug via INTRAVENOUS
  Filled 2017-10-06: qty 2

## 2017-10-06 NOTE — ED Provider Notes (Signed)
[facility] EMERGENCY DEPARTMENT Provider Note   CSN: [id number] Arrival date & time: 10/06/17  2126     History   Chief Complaint Chief Complaint  Patient presents with  . Fall    Left Hip Pain     HPI [name] is a 84 y.o. female.  Patient presents after mechanical fall while walking across the room at [facility] facility. She fell backwards onto carpeted floor. No LOC, headache, neck/chest/abdominal pain. She was unable to get up and complains of left hip pain. No nausea, vomiting. She has a significant cardiac history including CAD, CM, pacemaker and is on Plavix.    The history is provided by the patient and a relative. No language interpreter was used.    Past Medical History:  Diagnosis Date  . Breast cancer ([place])    bilaterally  . CAD (coronary artery disease)    w/ DES to LAD with residual diagonal disease to small for PCI 6/11 and repeat DES to LAD for restenosis 3/12  . Cardiomyopathy ([place])    Mixed CM; s/p St. Jude CRT-P implant 10/24 w/ LV lead revision 10/25 . Echo 08/2015 with EF 45%.    . Chronic systolic CHF (congestive heart failure) ([place])   . Dyslipidemia, goal LDL below 70   . GERD (gastroesophageal reflux disease)    occ  . Hematuria 02/23/2013   w/clots required hospital admission on 02/23/2013  . Hypertension   . LBBB (left bundle branch block)   . Macular degeneration   . Mild mitral regurgitation   . Osteoarthritis    "right knee" (02/23/2013)  . Osteopenia   . Pacemaker   . Pulmonary HTN ([place])    PASP 71mmHg by echo 11/2016  . Umbilical hernia    Repaired on 01/10/2013   . Vitamin B12 deficiency     Patient Active Problem List   Diagnosis Date Noted  . Anemia 07/23/2016  . Pulmonary HTN ([place]) 11/22/2015  . SOB (shortness of breath) 11/22/2015  . Dyslipidemia 08/10/2013  . Benign essential HTN 08/10/2013  . Cardiomyopathy ([place])   . CAD (coronary artery disease)   . Urinary retention 02/23/2013  .  Pacemaker-Medtronic-CRT 05/28/2012  . Left bundle branch block 05/11/2012  . Chronic systolic congestive heart failure ([place]) 05/11/2012  . Edema-chronic lower extremity X decades 05/11/2012  . DEGENERATIVE JOINT DISEASE 04/18/2010  . HIP PAIN, RIGHT 04/18/2010  . KNEE PAIN, RIGHT, CHRONIC 02/28/2009  . BACK PAIN, CHRONIC 02/28/2009  . OTHER ACQUIRED DEFORMITY OF ANKLE AND FOOT OTHER 02/28/2009    Past Surgical History:  Procedure Laterality Date  . BI-VENTRICULAR PACEMAKER INSERTION N/A 05/27/2012   Procedure: BI-VENTRICULAR PACEMAKER INSERTION (CRT-P);  Surgeon: Deboraha Sprang, MD;  Location: [facility] CATH LAB;  Service: Cardiovascular;  Laterality: N/A;  . BREAST BIOPSY  1995   bilaterally  . CATARACT EXTRACTION W/ INTRAOCULAR LENS  IMPLANT, BILATERAL  1990's  . CORONARY ANGIOPLASTY WITH STENT PLACEMENT  2010   "1" (05/27/2012)  . HERNIA REPAIR    . INSERT / REPLACE / REMOVE PACEMAKER  05/27/2012   initial placement - BiV PPM  . INSERT / REPLACE / REMOVE PACEMAKER  05/31/2012   "lead change/repaired" (05/31/2012)  . INSERTION OF MESH N/A 01/10/2013   Procedure: INSERTION OF MESH;  Surgeon: Haywood Lasso, MD;  Location: [facility];  Service: General;  Laterality: N/A;  . JOINT REPLACEMENT    . LEAD REVISION N/A 05/28/2012   Procedure: LEAD REVISION;  Surgeon: Champ Mungo  Lovena Le, MD;  Location: [facility] CATH LAB;  Service: Cardiovascular;  Laterality: N/A;  . LEAD REVISION Left 05/31/2012   Procedure: LEAD REVISION;  Surgeon: Deboraha Sprang, MD;  Location: [facility] CATH LAB;  Service: Cardiovascular;  Laterality: Left;  Marland Kitchen MASTECTOMY  1995   bilaterally  . TONSILLECTOMY    . TOTAL HIP ARTHROPLASTY  2011   right  . UMBILICAL HERNIA REPAIR N/A 01/10/2013   Procedure: HERNIA REPAIR UMBILICAL ADULT;  Surgeon: Haywood Lasso, MD;  Location: [place] [facility];  Service: General;  Laterality: N/A;    OB History    No data available       Home Medications    Prior to Admission medications   Medication Sig  Start Date End Date Taking? Authorizing Provider  albuterol (PROVENTIL HFA;VENTOLIN HFA) 108 (90 Base) MCG/ACT inhaler Inhale 1-2 puffs into the lungs every 6 (six) hours as needed for wheezing or shortness of breath.   Yes [provider]  carvedilol (COREG) 6.25 MG tablet TAKE ONE TABLET TWICE DAILY 05/13/17  Yes Turner, Eber Hong, MD  Cholecalciferol (VITAMIN D-3) 5000 UNITS TABS Take 5,000 Units by mouth daily.    Yes [provider]  clopidogrel (PLAVIX) 75 MG tablet TAKE ONE TABLET BY MOUTH ONCE DAILY 12/10/16  Yes Turner, Eber Hong, MD  furosemide (LASIX) 40 MG tablet Take 1 tablet (40 mg total) by mouth daily. 11/27/16 11/22/17 Yes Turner, Eber Hong, MD  ibuprofen (ADVIL,MOTRIN) 200 MG tablet Take 200 mg by mouth every 6 (six) hours as needed for moderate pain.    Yes [provider]  isosorbide mononitrate (IMDUR) 60 MG 24 hr tablet TAKE ONE AND ONE-HALF TABLETS DAILY 04/22/17  Yes Turner, Eber Hong, MD  nitroGLYCERIN (NITROSTAT) 0.4 MG SL tablet Place 1 tablet (0.4 mg total) every 5 (five) minutes as needed under the tongue for chest pain. 06/09/17  Yes Turner, Eber Hong, MD  simvastatin (ZOCOR) 20 MG tablet TAKE ONE TABLET EACH DAY AT 6PM 04/22/17  Yes Sueanne Margarita, MD  aspirin EC 81 MG tablet Take 81 mg by mouth daily.    [provider]    Family History Family History  Problem Relation Age of Onset  . Heart disease Mother   . Breast cancer Mother   . Heart disease Father   . Breast cancer Maternal Aunt   . Breast cancer Daughter   . Breast cancer Maternal Aunt   . Asthma Grandchild     Social History Social History   Tobacco Use  . Smoking status: Former Smoker    Packs/day: 1.00    Years: 20.00    Pack years: 20.00    Types: Cigarettes    Start date: 03/17/1943    Last attempt to quit: 01/05/1963    Years since quitting: 54.7  . Smokeless tobacco: Never Used  Substance Use Topics  . Alcohol use: Yes    Alcohol/week: 3.0 oz    Types: 5 Glasses  of wine per week    Comment: 02/23/2013 "glass of wine ~ 5X/wk":  . Drug use: No     Allergies   Altace [ramipril] and Celebrex [celecoxib]   Review of Systems Review of Systems  Constitutional: Negative for chills and fever.  Respiratory: Negative.  Negative for cough and shortness of breath.   Cardiovascular: Negative.  Negative for chest pain.  Gastrointestinal: Negative.  Negative for abdominal pain, nausea and vomiting.  Musculoskeletal: Negative for back pain and neck pain.  See HPI.  Skin: Negative.  Negative for wound.  Neurological: Negative.  Negative for dizziness, syncope, weakness and headaches.     Physical Exam Updated Vital Signs BP (!) 142/61 (BP Location: Left Arm)   Pulse 77   Temp 97.7 F (36.5 C) (Oral)   Resp 16   SpO2 92%   Physical Exam  Constitutional: She is oriented to person, place, and time. She appears well-developed and well-nourished. No distress.  HENT:  Head: Normocephalic.  Neck: Normal range of motion. Neck supple.  Cardiovascular: Normal rate, regular rhythm and intact distal pulses.  Pulmonary/Chest: Effort normal and breath sounds normal. She has no wheezes. She has no rales.  Abdominal: Soft. Bowel sounds are normal. There is no tenderness. There is no rebound and no guarding.  Musculoskeletal: She exhibits tenderness.  Moves all extremities except for left hip. Left LE shortened and rotated. There is posterior hip tenderness.   Neurological: She is alert and oriented to person, place, and time.  Skin: Skin is warm and dry. No rash noted. She is not diaphoretic.  Psychiatric: She has a normal mood and affect.     ED Treatments / Results  Labs (all labs ordered are listed, but only abnormal results are displayed) Labs Reviewed  PROTIME-INR  CBC WITH DIFFERENTIAL/PLATELET  COMPREHENSIVE METABOLIC PANEL  URINALYSIS, ROUTINE W REFLEX MICROSCOPIC  TYPE AND SCREEN    EKG  EKG Interpretation None        Radiology Dg Hip Unilat W Or Wo Pelvis 2-3 Views Left  Result Date: 10/06/2017 CLINICAL DATA:  Fall with left hip pain EXAM: DG HIP (WITH OR WITHOUT PELVIS) 2-3V LEFT COMPARISON:  10/17/2015 FINDINGS: Status post right hip replacement with normal alignment. Acute displaced left femoral neck fracture with cephalad migration of the trochanter. Low lying bowel loop in the right pelvis raises possibility for inguinal hernia IMPRESSION: 1. Acute displaced left femoral neck fracture 2. Status post right hip replacement with normal alignment 3. Low lying right lower pelvic bowel loops, possible inguinal hernia. Electronically Signed   By: Donavan Foil M.D.   On: 10/06/2017 22:45    Procedures Procedures (including critical care time)  Medications Ordered in ED Medications  0.9 %  sodium chloride infusion (not administered)     Initial Impression / Assessment and Plan / ED Course  I have reviewed the triage vital signs and the nursing notes.  Pertinent labs & imaging results that were available during my care of the patient were reviewed by me and considered in my medical decision making (see chart for details).     Patient with left femoral neck fracture s/p fall at home. No other injury suspected. She is anticoagulated with Plavix.   Labs pending. She has a St. Jude pacer which will be interrogated. She has had a previous right hip replacement by Dr. Mayer Camel and continues to follow with him in the office. Will call Dr. Mayer Camel for orthopedic care, admit to hospitalist.   Discussed the patient's injury with Dr. Latanya Maudlin who advises he will see her in the morning and plan procedure for tomorrow afternoon.   ADDENDUM: pacer was not interrogated in the ED prior to admission.  Final Clinical Impressions(s) / ED Diagnoses   Final diagnoses:  None   1. Left hip fracture  ED Discharge Orders    None       Charlann Lange, PA-C 10/07/17 1245    Blanchie Dessert, MD 10/07/17 2039

## 2017-10-06 NOTE — ED Notes (Signed)
Patient transported to X-ray 

## 2017-10-06 NOTE — ED Triage Notes (Addendum)
Patient arrived with EMS from [facility] home , she lost her balance and fell while using her walker this evening reports left lower hip pain , denies LOC, alert and oriented/respirations unlabored .

## 2017-10-06 NOTE — ED Notes (Signed)
Dr. Maryan Rued ( EDP ) explained tests results and plan of care to pt. and family .

## 2017-10-07 DIAGNOSIS — Z01810 Encounter for preprocedural cardiovascular examination: Secondary | ICD-10-CM | POA: Diagnosis not present

## 2017-10-07 DIAGNOSIS — I130 Hypertensive heart and chronic kidney disease with heart failure and stage 1 through stage 4 chronic kidney disease, or unspecified chronic kidney disease: Secondary | ICD-10-CM | POA: Diagnosis present

## 2017-10-07 DIAGNOSIS — M6281 Muscle weakness (generalized): Secondary | ICD-10-CM | POA: Diagnosis not present

## 2017-10-07 DIAGNOSIS — Z9841 Cataract extraction status, right eye: Secondary | ICD-10-CM | POA: Diagnosis not present

## 2017-10-07 DIAGNOSIS — I5022 Chronic systolic (congestive) heart failure: Secondary | ICD-10-CM

## 2017-10-07 DIAGNOSIS — Z87891 Personal history of nicotine dependence: Secondary | ICD-10-CM | POA: Diagnosis not present

## 2017-10-07 DIAGNOSIS — R41841 Cognitive communication deficit: Secondary | ICD-10-CM | POA: Diagnosis not present

## 2017-10-07 DIAGNOSIS — Z803 Family history of malignant neoplasm of breast: Secondary | ICD-10-CM | POA: Diagnosis not present

## 2017-10-07 DIAGNOSIS — I447 Left bundle-branch block, unspecified: Secondary | ICD-10-CM | POA: Diagnosis not present

## 2017-10-07 DIAGNOSIS — E785 Hyperlipidemia, unspecified: Secondary | ICD-10-CM

## 2017-10-07 DIAGNOSIS — I5032 Chronic diastolic (congestive) heart failure: Secondary | ICD-10-CM

## 2017-10-07 DIAGNOSIS — I25118 Atherosclerotic heart disease of native coronary artery with other forms of angina pectoris: Secondary | ICD-10-CM | POA: Diagnosis present

## 2017-10-07 DIAGNOSIS — N183 Chronic kidney disease, stage 3 unspecified: Secondary | ICD-10-CM | POA: Diagnosis present

## 2017-10-07 DIAGNOSIS — I272 Pulmonary hypertension, unspecified: Secondary | ICD-10-CM | POA: Diagnosis present

## 2017-10-07 DIAGNOSIS — Y92009 Unspecified place in unspecified non-institutional (private) residence as the place of occurrence of the external cause: Secondary | ICD-10-CM | POA: Diagnosis not present

## 2017-10-07 DIAGNOSIS — R488 Other symbolic dysfunctions: Secondary | ICD-10-CM | POA: Diagnosis not present

## 2017-10-07 DIAGNOSIS — K219 Gastro-esophageal reflux disease without esophagitis: Secondary | ICD-10-CM | POA: Diagnosis present

## 2017-10-07 DIAGNOSIS — Z950 Presence of cardiac pacemaker: Secondary | ICD-10-CM

## 2017-10-07 DIAGNOSIS — R8271 Bacteriuria: Secondary | ICD-10-CM | POA: Diagnosis not present

## 2017-10-07 DIAGNOSIS — I10 Essential (primary) hypertension: Secondary | ICD-10-CM | POA: Diagnosis not present

## 2017-10-07 DIAGNOSIS — W010XXA Fall on same level from slipping, tripping and stumbling without subsequent striking against object, initial encounter: Secondary | ICD-10-CM | POA: Diagnosis present

## 2017-10-07 DIAGNOSIS — Z853 Personal history of malignant neoplasm of breast: Secondary | ICD-10-CM | POA: Diagnosis not present

## 2017-10-07 DIAGNOSIS — Z96642 Presence of left artificial hip joint: Secondary | ICD-10-CM | POA: Diagnosis not present

## 2017-10-07 DIAGNOSIS — I5042 Chronic combined systolic (congestive) and diastolic (congestive) heart failure: Secondary | ICD-10-CM | POA: Diagnosis present

## 2017-10-07 DIAGNOSIS — Z66 Do not resuscitate: Secondary | ICD-10-CM | POA: Diagnosis present

## 2017-10-07 DIAGNOSIS — Z888 Allergy status to other drugs, medicaments and biological substances status: Secondary | ICD-10-CM | POA: Diagnosis not present

## 2017-10-07 DIAGNOSIS — D539 Nutritional anemia, unspecified: Secondary | ICD-10-CM | POA: Diagnosis present

## 2017-10-07 DIAGNOSIS — I2510 Atherosclerotic heart disease of native coronary artery without angina pectoris: Secondary | ICD-10-CM

## 2017-10-07 DIAGNOSIS — Z9842 Cataract extraction status, left eye: Secondary | ICD-10-CM | POA: Diagnosis not present

## 2017-10-07 DIAGNOSIS — R05 Cough: Secondary | ICD-10-CM | POA: Diagnosis not present

## 2017-10-07 DIAGNOSIS — S72002A Fracture of unspecified part of neck of left femur, initial encounter for closed fracture: Secondary | ICD-10-CM | POA: Diagnosis not present

## 2017-10-07 DIAGNOSIS — S72092A Other fracture of head and neck of left femur, initial encounter for closed fracture: Secondary | ICD-10-CM | POA: Diagnosis not present

## 2017-10-07 DIAGNOSIS — W19XXXA Unspecified fall, initial encounter: Secondary | ICD-10-CM

## 2017-10-07 DIAGNOSIS — D649 Anemia, unspecified: Secondary | ICD-10-CM | POA: Diagnosis not present

## 2017-10-07 DIAGNOSIS — I472 Ventricular tachycardia: Secondary | ICD-10-CM | POA: Diagnosis not present

## 2017-10-07 DIAGNOSIS — Z96641 Presence of right artificial hip joint: Secondary | ICD-10-CM | POA: Diagnosis present

## 2017-10-07 DIAGNOSIS — N179 Acute kidney failure, unspecified: Secondary | ICD-10-CM | POA: Diagnosis not present

## 2017-10-07 DIAGNOSIS — Z471 Aftercare following joint replacement surgery: Secondary | ICD-10-CM | POA: Diagnosis not present

## 2017-10-07 DIAGNOSIS — Z961 Presence of intraocular lens: Secondary | ICD-10-CM | POA: Diagnosis present

## 2017-10-07 DIAGNOSIS — E876 Hypokalemia: Secondary | ICD-10-CM | POA: Diagnosis not present

## 2017-10-07 DIAGNOSIS — M8580 Other specified disorders of bone density and structure, unspecified site: Secondary | ICD-10-CM | POA: Diagnosis present

## 2017-10-07 DIAGNOSIS — S79911A Unspecified injury of right hip, initial encounter: Secondary | ICD-10-CM | POA: Diagnosis not present

## 2017-10-07 DIAGNOSIS — M1990 Unspecified osteoarthritis, unspecified site: Secondary | ICD-10-CM | POA: Diagnosis present

## 2017-10-07 DIAGNOSIS — D62 Acute posthemorrhagic anemia: Secondary | ICD-10-CM | POA: Diagnosis not present

## 2017-10-07 DIAGNOSIS — G8911 Acute pain due to trauma: Secondary | ICD-10-CM | POA: Diagnosis not present

## 2017-10-07 DIAGNOSIS — N2889 Other specified disorders of kidney and ureter: Secondary | ICD-10-CM | POA: Diagnosis not present

## 2017-10-07 DIAGNOSIS — R278 Other lack of coordination: Secondary | ICD-10-CM | POA: Diagnosis not present

## 2017-10-07 DIAGNOSIS — R2689 Other abnormalities of gait and mobility: Secondary | ICD-10-CM | POA: Diagnosis not present

## 2017-10-07 DIAGNOSIS — Z955 Presence of coronary angioplasty implant and graft: Secondary | ICD-10-CM | POA: Diagnosis not present

## 2017-10-07 LAB — URINALYSIS, ROUTINE W REFLEX MICROSCOPIC
Bilirubin Urine: NEGATIVE
Glucose, UA: NEGATIVE mg/dL
Hgb urine dipstick: NEGATIVE
Ketones, ur: NEGATIVE mg/dL
Nitrite: POSITIVE — AB
Protein, ur: NEGATIVE mg/dL
Specific Gravity, Urine: 1.013 (ref 1.005–1.030)
Squamous Epithelial / LPF: NONE SEEN
pH: 6 (ref 5.0–8.0)

## 2017-10-07 LAB — BASIC METABOLIC PANEL
Anion gap: 11 (ref 5–15)
BUN: 21 mg/dL — ABNORMAL HIGH (ref 6–20)
CO2: 28 mmol/L (ref 22–32)
Calcium: 8.8 mg/dL — ABNORMAL LOW (ref 8.9–10.3)
Chloride: 103 mmol/L (ref 101–111)
Creatinine, Ser: 1.16 mg/dL — ABNORMAL HIGH (ref 0.44–1.00)
GFR calc Af Amer: 46 mL/min — ABNORMAL LOW (ref >60)
GFR calc non Af Amer: 40 mL/min — ABNORMAL LOW (ref >60)
Glucose, Bld: 105 mg/dL — ABNORMAL HIGH (ref 65–99)
Potassium: 3.4 mmol/L — ABNORMAL LOW (ref 3.5–5.1)
Sodium: 142 mmol/L (ref 135–145)

## 2017-10-07 LAB — CBC
HCT: 33.3 % — ABNORMAL LOW (ref 36.0–46.0)
Hemoglobin: 10.2 g/dL — ABNORMAL LOW (ref 12.0–15.0)
MCH: 30.9 pg (ref 26.0–34.0)
MCHC: 30.6 g/dL (ref 30.0–36.0)
MCV: 100.9 fL — ABNORMAL HIGH (ref 78.0–100.0)
Platelets: 179 10*3/uL (ref 150–400)
RBC: 3.3 MIL/uL — ABNORMAL LOW (ref 3.87–5.11)
RDW: 14.3 % (ref 11.5–15.5)
WBC: 10.7 10*3/uL — ABNORMAL HIGH (ref 4.0–10.5)

## 2017-10-07 LAB — BRAIN NATRIURETIC PEPTIDE: B Natriuretic Peptide: 161 pg/mL — ABNORMAL HIGH (ref 0.0–100.0)

## 2017-10-07 LAB — TYPE AND SCREEN
ABO/RH(D): O NEG
Antibody Screen: NEGATIVE

## 2017-10-07 LAB — APTT: aPTT: 26 seconds (ref 24–36)

## 2017-10-07 MED ORDER — SIMVASTATIN 20 MG PO TABS
20.0000 mg | ORAL_TABLET | Freq: Every day | ORAL | Status: DC
  Administered 2017-10-07 – 2017-10-10 (×4): 20 mg via ORAL
  Filled 2017-10-07 (×5): qty 1

## 2017-10-07 MED ORDER — POLYETHYLENE GLYCOL 3350 17 G PO PACK: 17.0000 g | PACK | Freq: Every day | ORAL | Status: DC | PRN

## 2017-10-07 MED ORDER — ISOSORBIDE MONONITRATE ER 60 MG PO TB24
90.0000 mg | ORAL_TABLET | Freq: Every day | ORAL | Status: DC
  Administered 2017-10-07 – 2017-10-12 (×6): 90 mg via ORAL
  Filled 2017-10-07: qty 1
  Filled 2017-10-07: qty 3
  Filled 2017-10-07 (×4): qty 1

## 2017-10-07 MED ORDER — CARVEDILOL 6.25 MG PO TABS
6.2500 mg | ORAL_TABLET | Freq: Two times a day (BID) | ORAL | Status: DC
  Administered 2017-10-07 – 2017-10-10 (×7): 6.25 mg via ORAL
  Filled 2017-10-07 (×9): qty 1

## 2017-10-07 MED ORDER — ZOLPIDEM TARTRATE 5 MG PO TABS: 5.0000 mg | ORAL_TABLET | Freq: Every evening | ORAL | Status: DC | PRN

## 2017-10-07 MED ORDER — HYDROXYZINE HCL 10 MG PO TABS: 10.0000 mg | ORAL_TABLET | Freq: Three times a day (TID) | ORAL | Status: DC | PRN

## 2017-10-07 MED ORDER — NITROGLYCERIN 0.4 MG SL SUBL: 0.4000 mg | SUBLINGUAL_TABLET | SUBLINGUAL | Status: DC | PRN

## 2017-10-07 MED ORDER — ADULT MULTIVITAMIN W/MINERALS CH
1.0000 | ORAL_TABLET | Freq: Every day | ORAL | Status: DC
  Administered 2017-10-07 – 2017-10-12 (×6): 1 via ORAL
  Filled 2017-10-07 (×6): qty 1

## 2017-10-07 MED ORDER — ASPIRIN EC 81 MG PO TBEC
81.0000 mg | DELAYED_RELEASE_TABLET | Freq: Every day | ORAL | Status: DC
  Administered 2017-10-07: 81 mg via ORAL
  Filled 2017-10-07: qty 1

## 2017-10-07 MED ORDER — ACETAMINOPHEN 325 MG PO TABS: 650.0000 mg | ORAL_TABLET | Freq: Four times a day (QID) | ORAL | Status: DC | PRN

## 2017-10-07 MED ORDER — MORPHINE SULFATE (PF) 4 MG/ML IV SOLN
INTRAVENOUS | Status: AC
  Filled 2017-10-07: qty 1

## 2017-10-07 MED ORDER — ALBUTEROL SULFATE (2.5 MG/3ML) 0.083% IN NEBU: 3.0000 mL | INHALATION_SOLUTION | Freq: Four times a day (QID) | RESPIRATORY_TRACT | Status: DC | PRN

## 2017-10-07 MED ORDER — METHOCARBAMOL 500 MG PO TABS
500.0000 mg | ORAL_TABLET | Freq: Three times a day (TID) | ORAL | Status: DC | PRN
  Administered 2017-10-07 – 2017-10-11 (×4): 500 mg via ORAL
  Filled 2017-10-07 (×4): qty 1

## 2017-10-07 MED ORDER — OXYCODONE-ACETAMINOPHEN 5-325 MG PO TABS
1.0000 | ORAL_TABLET | ORAL | Status: DC | PRN
  Administered 2017-10-07 – 2017-10-08 (×2): 1 via ORAL
  Filled 2017-10-07 (×2): qty 1

## 2017-10-07 MED ORDER — FUROSEMIDE 40 MG PO TABS
40.0000 mg | ORAL_TABLET | Freq: Every day | ORAL | Status: DC
  Administered 2017-10-07 – 2017-10-10 (×4): 40 mg via ORAL
  Filled 2017-10-07 (×3): qty 1
  Filled 2017-10-07: qty 2
  Filled 2017-10-07: qty 1

## 2017-10-07 MED ORDER — MORPHINE SULFATE (PF) 4 MG/ML IV SOLN
1.0000 mg | INTRAVENOUS | Status: DC | PRN
  Administered 2017-10-07 – 2017-10-08 (×4): 1 mg via INTRAVENOUS
  Filled 2017-10-07 (×3): qty 1

## 2017-10-07 MED ORDER — HYDRALAZINE HCL 20 MG/ML IJ SOLN: 5.0000 mg | INTRAMUSCULAR | Status: DC | PRN

## 2017-10-07 NOTE — Consult Note (Signed)
Reason for Consult:Left femoral neck fx Referring Physician: Anayely Chang is an 84 y.o. female with multiple medical problems HPI: [name] was ambulating with a RW at home (independent living at [facility]) when she fell. She's not really sure what caused the fall but denies presyncope/syncope/dizziness/LOC. She had immediate pain and could not get up. She was brought to the ED for evaluation where x-rays showed a left femoral neck fx and orthopedic surgery was consulted.  Past Medical History:  Diagnosis Date  . Breast cancer ([place])    bilaterally  . CAD (coronary artery disease)    w/ DES to LAD with residual diagonal disease to small for PCI 6/11 and repeat DES to LAD for restenosis 3/12  . Cardiomyopathy ([place])    Mixed CM; s/p St. Jude CRT-P implant 10/24 w/ LV lead revision 10/25 . Echo 08/2015 with EF 45%.    . Chronic systolic CHF (congestive heart failure) ([place])   . Dyslipidemia, goal LDL below 70   . GERD (gastroesophageal reflux disease)    occ  . Hematuria 02/23/2013   w/clots required hospital admission on 02/23/2013  . Hypertension   . LBBB (left bundle branch block)   . Macular degeneration   . Mild mitral regurgitation   . Osteoarthritis    "right knee" (02/23/2013)  . Osteopenia   . Pacemaker   . Pulmonary HTN ([place])    PASP 56mHg by echo 11/2016  . Umbilical hernia    Repaired on 01/10/2013   . Vitamin B12 deficiency     Past Surgical History:  Procedure Laterality Date  . BI-VENTRICULAR PACEMAKER INSERTION N/A 05/27/2012   Procedure: BI-VENTRICULAR PACEMAKER INSERTION (CRT-P);  Surgeon: SDeboraha Sprang MD;  [facility];  Service: Cardiovascular;  Laterality: N/A;  . BREAST BIOPSY  1995   bilaterally  . CATARACT EXTRACTION W/ INTRAOCULAR LENS  IMPLANT, BILATERAL  1990's  . CORONARY ANGIOPLASTY WITH STENT PLACEMENT  2010   "1" (05/27/2012)  . HERNIA REPAIR    . INSERT / REPLACE / REMOVE PACEMAKER  05/27/2012   initial placement - BiV PPM  .  INSERT / REPLACE / REMOVE PACEMAKER  05/31/2012   "lead change/repaired" (05/31/2012)  . INSERTION OF MESH N/A 01/10/2013   Procedure: INSERTION OF MESH;  Surgeon: CHaywood Lasso MD;  [place] [facility]  Service: General;  Laterality: N/A;  . JOINT REPLACEMENT    . LEAD REVISION N/A 05/28/2012   Procedure: LEAD REVISION;  Surgeon: GEvans Lance MD;  [facility];  Service: Cardiovascular;  Laterality: N/A;  . LEAD REVISION Left 05/31/2012   Procedure: LEAD REVISION;  Surgeon: SDeboraha Sprang MD;  [facility];  Service: Cardiovascular;  Laterality: Left;  .Marland KitchenMASTECTOMY  1995   bilaterally  . TONSILLECTOMY    . TOTAL HIP ARTHROPLASTY  2011   right  . UMBILICAL HERNIA REPAIR N/A 01/10/2013   Procedure: HERNIA REPAIR UMBILICAL ADULT;  Surgeon: CHaywood Lasso MD;  [facility];  Service: General;  Laterality: N/A;    Family History  Problem Relation Age of Onset  . Heart disease Mother   . Breast cancer Mother   . Heart disease Father   . Breast cancer Maternal Aunt   . Breast cancer Daughter   . Breast cancer Maternal Aunt   . Asthma Grandchild     Social History:  reports that she quit smoking about 54 years ago. Her smoking use included cigarettes. She started smoking about  74 years ago. She has a 20.00 pack-year smoking history. she has never used smokeless tobacco. She reports that she drinks about 3.0 oz of alcohol per week. She reports that she does not use drugs.  Allergies:  Allergies  Allergen Reactions  . Altace [Ramipril] Cough  . Celebrex [Celecoxib] Swelling and Cough    Reported by Dr. Andrew Au office on 07/22/2016.     Medications: I have reviewed the patient's current medications.  Results for orders placed or performed during the hospital encounter of 10/06/17 (from the past 48 hour(s))  Protime-INR     Status: None   Collection Time: 10/06/17 11:20 PM  Result Value Ref Range   Prothrombin Time 13.7 11.4 - 15.2 seconds   INR 1.06      Comment: Performed at [facility], [place] [address], [place], [place] [address]  Type and screen [place]     Status: None   Collection Time: 10/06/17 11:20 PM  Result Value Ref Range   ABO/RH(D) O NEG    Antibody Screen NEG    Sample Expiration      10/09/2017 Performed at [facility], [place] [address], [place], [place] [address]   CBC WITH DIFFERENTIAL     Status: Abnormal   Collection Time: 10/06/17 11:20 PM  Result Value Ref Range   WBC 9.4 4.0 - 10.5 K/uL   RBC 3.31 (L) 3.87 - 5.11 MIL/uL   Hemoglobin 10.4 (L) 12.0 - 15.0 g/dL   HCT 33.6 (L) 36.0 - 46.0 %   MCV 101.5 (H) 78.0 - 100.0 fL   MCH 31.4 26.0 - 34.0 pg   MCHC 31.0 30.0 - 36.0 g/dL   RDW 14.4 11.5 - 15.5 %   Platelets 191 150 - 400 K/uL   Neutrophils Relative % 82 %   Neutro Abs 7.7 1.7 - 7.7 K/uL   Lymphocytes Relative 11 %   Lymphs Abs 1.0 0.7 - 4.0 K/uL   Monocytes Relative 6 %   Monocytes Absolute 0.6 0.1 - 1.0 K/uL   Eosinophils Relative 1 %   Eosinophils Absolute 0.1 0.0 - 0.7 K/uL   Basophils Relative 0 %   Basophils Absolute 0.0 0.0 - 0.1 K/uL    Comment: Performed at [facility], [place] [address], [place], [place] [address]  Comprehensive metabolic panel     Status: Abnormal   Collection Time: 10/06/17 11:20 PM  Result Value Ref Range   Sodium 141 135 - 145 mmol/L   Potassium 3.5 3.5 - 5.1 mmol/L   Chloride 101 101 - 111 mmol/L   CO2 28 22 - 32 mmol/L   Glucose, Bld 117 (H) 65 - 99 mg/dL   BUN 22 (H) 6 - 20 mg/dL   Creatinine, Ser 1.24 (H) 0.44 - 1.00 mg/dL   Calcium 9.0 8.9 - 10.3 mg/dL   Total Protein 6.1 (L) 6.5 - 8.1 g/dL   Albumin 3.4 (L) 3.5 - 5.0 g/dL   AST 19 15 - 41 U/L   ALT 14 14 - 54 U/L   Alkaline Phosphatase 97 38 - 126 U/L   Total Bilirubin 0.8 0.3 - 1.2 mg/dL   GFR calc non Af Amer 37 (L) >60 mL/min   GFR calc Af Amer 42 (L) >60 mL/min    Comment: (NOTE) The eGFR has been calculated using the CKD EPI equation. This calculation has not  been validated in all clinical situations. eGFR's persistently <60 mL/min signify possible Chronic Kidney Disease.  Anion gap 12 5 - 15    Comment: Performed at [facility] [address] [place], [place] [address]  APTT     Status: None   Collection Time: 10/06/17 11:20 PM  Result Value Ref Range   aPTT 26 24 - 36 seconds    Comment: Performed at [facility] [address], [place], [place] [address]  Brain natriuretic peptide     Status: Abnormal   Collection Time: 10/06/17 11:20 PM  Result Value Ref Range   B Natriuretic Peptide 161.0 (H) 0.0 - 100.0 pg/mL    Comment: Performed at [facility] [address] [place], [place] [address]  Urinalysis, Routine w reflex microscopic     Status: Abnormal   Collection Time: 10/07/17  1:18 AM  Result Value Ref Range   Color, Urine YELLOW YELLOW   APPearance CLOUDY (A) CLEAR   Specific Gravity, Urine 1.013 1.005 - 1.030   pH 6.0 5.0 - 8.0   Glucose, UA NEGATIVE NEGATIVE mg/dL   Hgb urine dipstick NEGATIVE NEGATIVE   Bilirubin Urine NEGATIVE NEGATIVE   Ketones, ur NEGATIVE NEGATIVE mg/dL   Protein, ur NEGATIVE NEGATIVE mg/dL   Nitrite POSITIVE (A) NEGATIVE   Leukocytes, UA LARGE (A) NEGATIVE   RBC / HPF 0-5 0 - 5 RBC/hpf   WBC, UA TOO NUMEROUS TO COUNT 0 - 5 WBC/hpf   Bacteria, UA RARE (A) NONE SEEN   Squamous Epithelial / LPF NONE SEEN NONE SEEN    Comment: Performed at [facility], [place] [address] [place], [place] [address]  CBC     Status: Abnormal   Collection Time: 10/07/17  3:36 AM  Result Value Ref Range   WBC 10.7 (H) 4.0 - 10.5 K/uL   RBC 3.30 (L) 3.87 - 5.11 MIL/uL   Hemoglobin 10.2 (L) 12.0 - 15.0 g/dL   HCT 33.3 (L) 36.0 - 46.0 %   MCV 100.9 (H) 78.0 - 100.0 fL   MCH 30.9 26.0 - 34.0 pg   MCHC 30.6 30.0 - 36.0 g/dL   RDW 14.3 11.5 - 15.5 %   Platelets 179 150 - 400 K/uL    Comment: Performed at [facility], [place] [address] [place], [place] [address]  Basic metabolic panel      Status: Abnormal   Collection Time: 10/07/17  3:36 AM  Result Value Ref Range   Sodium 142 135 - 145 mmol/L   Potassium 3.4 (L) 3.5 - 5.1 mmol/L   Chloride 103 101 - 111 mmol/L   CO2 28 22 - 32 mmol/L   Glucose, Bld 105 (H) 65 - 99 mg/dL   BUN 21 (H) 6 - 20 mg/dL   Creatinine, Ser 1.16 (H) 0.44 - 1.00 mg/dL   Calcium 8.8 (L) 8.9 - 10.3 mg/dL   GFR calc non Af Amer 40 (L) >60 mL/min   GFR calc Af Amer 46 (L) >60 mL/min    Comment: (NOTE) The eGFR has been calculated using the CKD EPI equation. This calculation has not been validated in all clinical situations. eGFR's persistently <60 mL/min signify possible Chronic Kidney Disease.    Anion gap 11 5 - 15    Comment: Performed at [facility] [address] [place], [place] [address]    Dg Chest 1 View  Result Date: 10/06/2017 CLINICAL DATA:  Preop, hip fracture EXAM: CHEST 1 VIEW COMPARISON:  07/11/2015 FINDINGS: Surgical clips in the right axilla. Left-sided pacing device  as before. Small left-sided pleural effusion with left basilar atelectasis or pneumonia. Borderline heart size. Aortic atherosclerosis. No pneumothorax. IMPRESSION: Small left pleural effusion with left basilar atelectasis or pneumonia. Electronically Signed   By: Donavan Foil M.D.   On: 10/06/2017 23:43   Dg Hip Unilat W Or Wo Pelvis 2-3 Views Left  Result Date: 10/06/2017 CLINICAL DATA:  Fall with left hip pain EXAM: DG HIP (WITH OR WITHOUT PELVIS) 2-3V LEFT COMPARISON:  10/17/2015 FINDINGS: Status post right hip replacement with normal alignment. Acute displaced left femoral neck fracture with cephalad migration of the trochanter. Low lying bowel loop in the right pelvis raises possibility for inguinal hernia IMPRESSION: 1. Acute displaced left femoral neck fracture 2. Status post right hip replacement with normal alignment 3. Low lying right lower pelvic bowel loops, possible inguinal hernia. Electronically Signed   By: Donavan Foil M.D.   On: 10/06/2017  22:45    Review of Systems  Constitutional: Negative for weight loss.  HENT: Negative for ear discharge, ear pain, hearing loss and tinnitus.   Eyes: Negative for blurred vision, double vision, photophobia and pain.  Respiratory: Negative for cough, sputum production and shortness of breath.   Cardiovascular: Negative for chest pain.  Gastrointestinal: Negative for abdominal pain, nausea and vomiting.  Genitourinary: Negative for dysuria, flank pain, frequency and urgency.  Musculoskeletal: Positive for joint pain (Left hip). Negative for back pain, falls, myalgias and neck pain.  Neurological: Negative for dizziness, tingling, sensory change, focal weakness, loss of consciousness and headaches.  Endo/Heme/Allergies: Does not bruise/bleed easily.  Psychiatric/Behavioral: Negative for depression, memory loss and substance abuse. The patient is not nervous/anxious.    Blood pressure (!) 133/57, pulse 78, temperature 97.7 F (36.5 C), temperature source Oral, resp. rate 17, SpO2 96 %. Physical Exam  Constitutional: She appears well-developed and well-nourished. No distress.  HENT:  Head: Normocephalic and atraumatic.  Eyes: Conjunctivae are normal. Right eye exhibits no discharge. Left eye exhibits no discharge. No scleral icterus.  Neck: Normal range of motion.  Cardiovascular: Normal rate and regular rhythm.  Respiratory: Effort normal. No respiratory distress.  Musculoskeletal:  RLE No traumatic wounds, ecchymosis, or rash  Nontender  No knee or ankle effusion  Knee stable to varus/ valgus and anterior/posterior stress  Sens DPN, SPN, TN intact  Motor EHL, ext, flex, evers 5/5  DP 2+, PT 1+, No significant edema  LLE No traumatic wounds, ecchymosis, or rash  Severe TTP hip, leg short and externally rotated  No knee or ankle effusion  Knee stable to varus/ valgus and anterior/posterior stress  Sens DPN, SPN, TN intact  Motor EHL, ext, flex, evers 5/5  DP 2+, PT 1+, No  significant edema  Neurological: She is alert.  Skin: Skin is warm and dry. She is not diaphoretic.  Psychiatric: She has a normal mood and affect. Her behavior is normal.    Assessment/Plan: Fall Left femoral neck fx -- For hemiarthroplasty tomorrow by Dr. Rhona Raider pending cardiac clearance. Timing TBD. Please keep NPO after MN. May have diet today. Multiple medical problems -- per primary team    Lisette Abu, PA-C Orthopedic Surgery [phone] 10/07/2017, 9:04 AM

## 2017-10-07 NOTE — ED Notes (Signed)
Heart healthy diet ordered. 

## 2017-10-07 NOTE — ED Notes (Signed)
Breakfast delivered  

## 2017-10-07 NOTE — Progress Notes (Signed)
PROGRESS NOTE    [name]  ZSW:[id number] DOB: 04/12/1923 DOA: 10/06/2017 PCP: Monica Arabian, MD   Brief Narrative:  HPI on 10/07/2017 by Dr. Ivor Costa [name] is a 84 y.o. female with medical history significant of hypertension, hyperlipidemia, GERD, pacemaker placement, MVR, left bundle blockage, dCHF, CAD, s/p of stent placement, bilateral breast cancer (s/p of bilateral mastectomy), pulmonary hypertension, GERD, CKD-3, who presents with fall and the left hip pain.  Pt states that she fell when she was walking toward TV using her walker and slipped. No LOC. She strongly denies any injury to head and the neck. She injured her left hip, and developed left hip pain, which is constant, sharp, 3 out of 10 in severity, nonradiating. No leg numbness. Patient does not have unilateral weakness, numbness or tingling to extremities. No facial droop or slurred speech. Denies chest pain, shortness of breath, nausea, vomiting, diarrhea, abdominal pain, symptoms of UTI.  Assessment & Plan   Patient admitted after midnight.  See H&P for full details.  Left femoral neck fracture -X-ray: Acute displaced left femoral neck fracture -Secondary to fall -Orthopedic surgery consulted and appreciated -Continue pain control -Cardiology consulted for preoperative assessment -patient is likely moderate risk for surgery   Fall -Mechanical -Patient denied hitting her head, refused CT head and neck on admission -Patient states she does use a walker for ambulation -PT and OT will be consulted after surgery  Chronic diastolic heart failure -Echocardiogram 11/24/2016 showed EF of 32-20%, grade 1 diastolic dysfunction -Patient currently appears to be euvolemic and compensated.  She does have very trace amount of lower extremity edema however no JVD or shortness of breath -Continue Lasix -BNP 161  Coronary artery disease -Currently denies any chest pain -Plavix held for possible surgery -Continue  Coreg, Imdur, statin -Cardiology consulted and appreciated  Hyperlipidemia -Continue statin  Essential hypertension -Continue Coreg, hydralazine as needed  Chronic kidney disease, stage III -Creatinine appears to be stable and at baseline, continue to monitor BMP  DVT Prophylaxis  SCDs  Code Status: DNR  Family Communication: Family at bedside  Disposition Plan: Admitted, pending surgery on 10/08/2017.  Consultants Cardiology Orthopedic surgery  Procedures  None  Antibiotics   Anti-infectives (From admission, onward)   None      Subjective:   [name] seen and examined today.  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 Denies current chest pain, shortness breath, abdominal pain, nausea vomiting, diarrhea constipation.  Objective:   Vitals:   10/07/17 0900 10/07/17 1000 10/07/17 1300 10/07/17 1340  BP: (!) 142/68  (!) 118/53 (!) 104/54  Pulse: 77 75 73 63  Resp: 18   17  Temp:   97.9 F (36.6 C) 98.4 F (36.9 C)  TempSrc:   Oral Oral  SpO2: 95% 97% 98% 98%   No intake or output data in the 24 hours ending 10/07/17 1649 There were no vitals filed for this visit.  Exam  General: Well developed, well nourished, NAD, appears stated age  HEENT: NCAT, mucous membranes moist.   Neck: Supple, no JVD, no masses  Cardiovascular: S1 S2 auscultated, no rubs, murmurs or gallops. Regular rate and rhythm.  Respiratory: Clear to auscultation bilaterally with equal chest rise  Abdomen: Soft, nontender, nondistended, + bowel sounds  Extremities: warm dry without cyanosis clubbing. Lower extremities externally rotated, left shorter  Neuro: AAOx3, nonfocal  Psych: Normal affect and demeanor with intact judgement and insight   Data Reviewed: I have personally reviewed following  labs and imaging studies  CBC: Recent Labs  Lab 10/06/17 2320 10/07/17 0336  WBC 9.4 10.7*  NEUTROABS 7.7  --   HGB 10.4* 10.2*  HCT 33.6* 33.3*  MCV 101.5* 100.9*  PLT 191  664   Basic Metabolic Panel: Recent Labs  Lab 10/06/17 2320 10/07/17 0336  NA 141 142  K 3.5 3.4*  CL 101 103  CO2 28 28  GLUCOSE 117* 105*  BUN 22* 21*  CREATININE 1.24* 1.16*  CALCIUM 9.0 8.8*   GFR: CrCl cannot be calculated (Unknown ideal weight.). Liver Function Tests: Recent Labs  Lab 10/06/17 2320  AST 19  ALT 14  ALKPHOS 97  BILITOT 0.8  PROT 6.1*  ALBUMIN 3.4*   No results for input(s): LIPASE, AMYLASE in the last 168 hours. No results for input(s): AMMONIA in the last 168 hours. Coagulation Profile: Recent Labs  Lab 10/06/17 2320  INR 1.06   Cardiac Enzymes: No results for input(s): CKTOTAL, CKMB, CKMBINDEX, TROPONINI in the last 168 hours. BNP (last 3 results) No results for input(s): PROBNP in the last 8760 hours. HbA1C: No results for input(s): HGBA1C in the last 72 hours. CBG: No results for input(s): GLUCAP in the last 168 hours. Lipid Profile: No results for input(s): CHOL, HDL, LDLCALC, TRIG, CHOLHDL, LDLDIRECT in the last 72 hours. Thyroid Function Tests: No results for input(s): TSH, T4TOTAL, FREET4, T3FREE, THYROIDAB in the last 72 hours. Anemia Panel: No results for input(s): VITAMINB12, FOLATE, FERRITIN, TIBC, IRON, RETICCTPCT in the last 72 hours. Urine analysis:    Component Value Date/Time   COLORURINE YELLOW 10/07/2017 0118   APPEARANCEUR CLOUDY (A) 10/07/2017 0118   LABSPEC 1.013 10/07/2017 0118   PHURINE 6.0 10/07/2017 0118   GLUCOSEU NEGATIVE 10/07/2017 0118   HGBUR NEGATIVE 10/07/2017 0118   BILIRUBINUR NEGATIVE 10/07/2017 0118   KETONESUR NEGATIVE 10/07/2017 0118   PROTEINUR NEGATIVE 10/07/2017 0118   UROBILINOGEN 0.2 02/23/2013 0956   NITRITE POSITIVE (A) 10/07/2017 0118   LEUKOCYTESUR LARGE (A) 10/07/2017 0118   Sepsis Labs: @LABRCNTIP (procalcitonin:4,lacticidven:4)  )No results found for this or any previous visit (from the past 240 hour(s)).    Radiology Studies: Dg Chest 1 View  Result Date:  10/06/2017 CLINICAL DATA:  Preop, hip fracture EXAM: CHEST 1 VIEW COMPARISON:  07/11/2015 FINDINGS: Surgical clips in the right axilla. Left-sided pacing device as before. Small left-sided pleural effusion with left basilar atelectasis or pneumonia. Borderline heart size. Aortic atherosclerosis. No pneumothorax. IMPRESSION: Small left pleural effusion with left basilar atelectasis or pneumonia. Electronically Signed   By: Donavan Foil M.D.   On: 10/06/2017 23:43   Dg Hip Unilat W Or Wo Pelvis 2-3 Views Left  Result Date: 10/06/2017 CLINICAL DATA:  Fall with left hip pain EXAM: DG HIP (WITH OR WITHOUT PELVIS) 2-3V LEFT COMPARISON:  10/17/2015 FINDINGS: Status post right hip replacement with normal alignment. Acute displaced left femoral neck fracture with cephalad migration of the trochanter. Low lying bowel loop in the right pelvis raises possibility for inguinal hernia IMPRESSION: 1. Acute displaced left femoral neck fracture 2. Status post right hip replacement with normal alignment 3. Low lying right lower pelvic bowel loops, possible inguinal hernia. Electronically Signed   By: Donavan Foil M.D.   On: 10/06/2017 22:45     Scheduled Meds: . aspirin EC  81 mg Oral Daily  . carvedilol  6.25 mg Oral BID  . furosemide  40 mg Oral Daily  . isosorbide mononitrate  90 mg Oral Daily  . multivitamin with  minerals  1 tablet Oral Daily  . simvastatin  20 mg Oral q1800   Continuous Infusions: . sodium chloride 10 mL/hr at 10/06/17 2328     LOS: 0 days   Time Spent in minutes   30 minutes  Thelbert Gartin D.O. on 10/07/2017 at 4:49 PM  Between 7am to 7pm - Pager - [phone]  After 7pm go to [email or web address] - password [email or web address]  And look for the night coverage person covering for me after hours  [facility] Office  [phone]

## 2017-10-07 NOTE — Consult Note (Addendum)
Cardiology Consultation:   Patient ID: [name]; [id number]; 09-27-1922   Admit date: 10/06/2017 Date of Consult: 10/07/2017  Primary Care Provider: Gaynelle Arabian, MD Primary Cardiologist: Fransico Him, MD  Primary Electrophysiologist:  Virl Axe, MD   Patient Profile:   [name] is a 84 y.o. female with a hx of hypertension, hyperlipidemia, GERD, MVR, LBBB, chronic chronic diastolic CHF/ischemic DCM(EF 45% 08/2015), s/p Medtronic BiV pacemaker, CAD S/P stent, chronic stable angina bilateral breast cancer as/P bilateral mastectomy pulmonary hypertension, CKD stage III who is being seen today for preoperative cardiac evaluation at the request of Dr. Ree Kida.  History of Present Illness:   Ms. Schoch resides at [facility] in an independent living apartment.  At baseline she is independent for most of her ADLs, uses a walker.  She has history of CAD status post DES to LAD with residual disease of the diagonal that was too small for PCI by cath in 01/2010 and then repeat cath with restenosis of the LAD S/P  PCI 10/2010.  At her last appointment on 06/09/2017 Dr. Radford Pax noted that the patient has chronic stable angina that had not changed and was using sublingual nitroglycerin approximately once monthly.  She was noted to have chronic dyspnea on exertion that was stable and unchanged.  She also has chronic lower extremity edema that is stable.   Ms Bunney was in her apartment yesterday sitting in her recliner watching TV.  She got up and got her walker and began to walk towards the television to change the channel and she suddenly went down.  She does not recall tripping or getting tangled up.  She denies any dizziness and no loss of consciousness.  She had no chest pain shortness of breath or palpitations.  She tells me that her chronic angina has been less recently, only using nitroglycerin about every 6 months.  Her last episode was about 2 months ago she had 5 minutes of mild chest pressure  that was relieved by sublingual nitroglycerin x2.  Currently her chronic DOE is at baseline.  Her lower extremity edema is reportedly at baseline and is more puffy with no pitting.  She takes Lasix on a daily basis.  She denies orthopnea or PND.  Ms Eckman was brought to the emergency department after her fall and was found to have a left femoral neck fracture.  Orthopedics was consulted and they plan for hemiarthroplasty tomorrow.   The patient is pleasant, very alert and conversant.  She feels strongly that she has had no recent new symptoms and she understands that she is at somewhat increased risk for surgery but she very much wants to proceed.  She is stating that she does not want our evaluation to interfere with her surgery.  Some of her children on a cruise and she is not wanting to bother them.  She does have 1 daughter that is in town and is aware.  The patient was evaluated for shortness of breath and 2017 was found to have BNP only minimally elevated.  Echocardiogram showed normal LV systolic function with grade 1 diastolic dysfunction.  She also had a low risk Myoview.  Last pacemaker interrogation on 05/20/17, CRT-P, showed normal device function, histograms are appropriate for patient and level of activity, less than 0.1% AT/AF burden.  She had biventricular pacing 99.1% of the time.  Past Medical History:  Diagnosis Date  . Breast cancer ([place])    bilaterally  . CAD (coronary artery disease)  w/ DES to LAD with residual diagonal disease to small for PCI 6/11 and repeat DES to LAD for restenosis 3/12  . Cardiomyopathy ([place])    Mixed CM; s/p St. Jude CRT-P implant 10/24 w/ LV lead revision 10/25 . Echo 08/2015 with EF 45%.    . Chronic systolic CHF (congestive heart failure) ([place])   . Dyslipidemia, goal LDL below 70   . GERD (gastroesophageal reflux disease)    occ  . Hematuria 02/23/2013   w/clots required hospital admission on 02/23/2013  . Hypertension   . LBBB (left bundle  branch block)   . Macular degeneration   . Mild mitral regurgitation   . Osteoarthritis    "right knee" (02/23/2013)  . Osteopenia   . Pacemaker   . Pulmonary HTN ([place])    PASP 27mmHg by echo 11/2016  . Umbilical hernia    Repaired on 01/10/2013   . Vitamin B12 deficiency     Past Surgical History:  Procedure Laterality Date  . BI-VENTRICULAR PACEMAKER INSERTION N/A 05/27/2012   Procedure: BI-VENTRICULAR PACEMAKER INSERTION (CRT-P);  Surgeon: Deboraha Sprang, MD;  Location: [facility] CATH LAB;  Service: Cardiovascular;  Laterality: N/A;  . BREAST BIOPSY  1995   bilaterally  . CATARACT EXTRACTION W/ INTRAOCULAR LENS  IMPLANT, BILATERAL  1990's  . CORONARY ANGIOPLASTY WITH STENT PLACEMENT  2010   "1" (05/27/2012)  . HERNIA REPAIR    . INSERT / REPLACE / REMOVE PACEMAKER  05/27/2012   initial placement - BiV PPM  . INSERT / REPLACE / REMOVE PACEMAKER  05/31/2012   "lead change/repaired" (05/31/2012)  . INSERTION OF MESH N/A 01/10/2013   Procedure: INSERTION OF MESH;  Surgeon: Haywood Lasso, MD;  Location: [place];  Service: General;  Laterality: N/A;  . JOINT REPLACEMENT    . LEAD REVISION N/A 05/28/2012   Procedure: LEAD REVISION;  Surgeon: Evans Lance, MD;  Location: [facility] CATH LAB;  Service: Cardiovascular;  Laterality: N/A;  . LEAD REVISION Left 05/31/2012   Procedure: LEAD REVISION;  Surgeon: Deboraha Sprang, MD;  Location: [facility] CATH LAB;  Service: Cardiovascular;  Laterality: Left;  Marland Kitchen MASTECTOMY  1995   bilaterally  . TONSILLECTOMY    . TOTAL HIP ARTHROPLASTY  2011   right  . UMBILICAL HERNIA REPAIR N/A 01/10/2013   Procedure: HERNIA REPAIR UMBILICAL ADULT;  Surgeon: Haywood Lasso, MD;  Location: [place];  Service: General;  Laterality: N/A;     Home Medications:  Prior to Admission medications   Medication Sig Start Date End Date Taking? Authorizing Provider  albuterol (PROVENTIL HFA;VENTOLIN HFA) 108 (90 Base) MCG/ACT inhaler Inhale 1-2 puffs into the lungs every 6 (six) hours  as needed for wheezing or shortness of breath.   Yes [provider]  carvedilol (COREG) 6.25 MG tablet TAKE ONE TABLET TWICE DAILY 05/13/17  Yes Turner, Eber Hong, MD  Cholecalciferol (VITAMIN D-3) 5000 UNITS TABS Take 5,000 Units by mouth daily.    Yes [provider]  clopidogrel (PLAVIX) 75 MG tablet TAKE ONE TABLET BY MOUTH ONCE DAILY 12/10/16  Yes Turner, Eber Hong, MD  furosemide (LASIX) 40 MG tablet Take 1 tablet (40 mg total) by mouth daily. 11/27/16 11/22/17 Yes Turner, Eber Hong, MD  ibuprofen (ADVIL,MOTRIN) 200 MG tablet Take 200 mg by mouth every 6 (six) hours as needed for moderate pain.    Yes [provider]  isosorbide mononitrate (IMDUR) 60 MG 24 hr tablet TAKE ONE AND ONE-HALF TABLETS DAILY 04/22/17  Yes Turner,  Eber Hong, MD  nitroGLYCERIN (NITROSTAT) 0.4 MG SL tablet Place 1 tablet (0.4 mg total) every 5 (five) minutes as needed under the tongue for chest pain. 06/09/17  Yes Turner, Eber Hong, MD  simvastatin (ZOCOR) 20 MG tablet TAKE ONE TABLET EACH DAY AT 6PM 04/22/17  Yes Sueanne Margarita, MD  aspirin EC 81 MG tablet Take 81 mg by mouth daily.    [provider]    Inpatient Medications: Scheduled Meds: . aspirin EC  81 mg Oral Daily  . carvedilol  6.25 mg Oral BID  . furosemide  40 mg Oral Daily  . isosorbide mononitrate  90 mg Oral Daily  . multivitamin with minerals  1 tablet Oral Daily  . simvastatin  20 mg Oral q1800   Continuous Infusions: . sodium chloride 10 mL/hr at 10/06/17 2328   PRN Meds: acetaminophen, albuterol, hydrALAZINE, hydrOXYzine, methocarbamol, morphine injection, nitroGLYCERIN, oxyCODONE-acetaminophen, polyethylene glycol, zolpidem  Allergies:    Allergies  Allergen Reactions  . Altace [Ramipril] Cough  . Celebrex [Celecoxib] Swelling and Cough    Reported by Dr. Andrew Au office on 07/22/2016.     Social History:   Social History   Socioeconomic History  . Marital status: Widowed    Spouse name: Not on file  .  Number of children: Not on file  . Years of education: Not on file  . Highest education level: Not on file  Social Needs  . Financial resource strain: Not on file  . Food insecurity - worry: Not on file  . Food insecurity - inability: Not on file  . Transportation needs - medical: Not on file  . Transportation needs - non-medical: Not on file  Occupational History  . Not on file  Tobacco Use  . Smoking status: Former Smoker    Packs/day: 1.00    Years: 20.00    Pack years: 20.00    Types: Cigarettes    Start date: 03/17/1943    Last attempt to quit: 01/05/1963    Years since quitting: 54.7  . Smokeless tobacco: Never Used  Substance and Sexual Activity  . Alcohol use: Yes    Alcohol/week: 3.0 oz    Types: 5 Glasses of wine per week    Comment: 02/23/2013 "glass of wine ~ 5X/wk":  . Drug use: No  . Sexual activity: Not Currently  Other Topics Concern  . Not on file  Social History Narrative   Originally from [place], [place]. Previously lived in [place], [place]. Graduated college with a business degree (BS in [employer]). Currently enjoys reading. Previously enjoyed sewing and knitting. Previously also enjoyed water skiing. Previously worked as a Network engineer. Husband was a Personnel officer. Also previously worked for the [employer] for a year. 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 Doesn't have down pillows. No mold or asbestos exposure.     Family History:    Family History  Problem Relation Age of Onset  . Heart disease Mother   . Breast cancer Mother   . Heart disease Father   . Breast cancer Maternal Aunt   . Breast cancer Daughter   . Breast cancer Maternal Aunt   . Asthma Grandchild      ROS:  Please see the history of present illness.   All other ROS reviewed and negative.     Physical Exam/Data:   Vitals:   10/07/17 0830 10/07/17 0900 10/07/17 1000 10/07/17 1300  BP: 129/62 (!) 142/68  (!) 118/53  Pulse: 77 77 75 73  Resp: (!) 23  18    Temp:    97.9 F (36.6 C)  TempSrc:    Oral  SpO2: 97% 95% 97% 98%   No intake or output data in the 24 hours ending 10/07/17 1411 There were no vitals filed for this visit. There is no height or weight on file to calculate BMI.  General:  Well nourished, well developed, in no acute distress HEENT: normal Lymph: no adenopathy Neck: no JVD Endocrine:  No thryomegaly Vascular: No carotid bruits; pedal pulses 2+ bilaterally  Cardiac:  normal S1, S2; RRR; no murmur  Lungs:  clear to auscultation bilaterally, no wheezing, rhonchi or rales  Abd: soft, nontender, no hepatomegaly  Ext: Lower extremity puffy edema, no pitting Musculoskeletal:  Pt has left hip fx, left leg slightly shorter than right. Skin: warm and dry  Neuro:  CNs 2-12 intact, no focal abnormalities noted Psych:  Normal affect   EKG:  The EKG was personally reviewed and demonstrates:  Sinus rhythm, 71 bpm, PAC, IVCD, consider atypical RBBB, ?if v pacing- spikes not easily seen, LVH  Telemetry: Not on telemetry  Relevant CV Studies:  Echocardiogram 11/24/2016 Study Conclusions - Left ventricle: The cavity size was normal. Wall thickness was   increased in a pattern of mild LVH. Systolic function was normal.   The estimated ejection fraction was in the range of 55% to 60%.   Wall motion was normal; there were no regional wall motion   abnormalities. Doppler parameters are consistent with abnormal   left ventricular relaxation (grade 1 diastolic dysfunction). - Aortic valve: There was no stenosis. - Mitral valve: There was mild regurgitation. - Left atrium: The atrium was mildly dilated. - Right ventricle: The cavity size was normal. Pacer wire or   catheter noted in right ventricle. Systolic function was normal. - Tricuspid valve: Peak RV-RA gradient (S): 38 mm Hg. - Pulmonary arteries: PA peak pressure: 41 mm Hg (S). - Inferior vena cava: The vessel was normal in size. The   respirophasic diameter changes  were in the normal range (= 50%),   consistent with normal central venous pressure.  Impressions: - Normal LV size with mild LV hypertrophy. EF 55-60%. Normal RV size and systolic function. Mild mitral regurgitation. Mild   pulmonary hypertension.  Nuclear stress test 11/27/2015 Study Highlights    Nuclear stress EF: 64%.  This is a low risk study.  The left ventricular ejection fraction is normal (55-65%).   Low risk stress nuclear study with a small, moderate intensity, fixed inferior defect consistent with thinning; no ischemia; EF 64 with normal wall motion.     Laboratory Data:  Chemistry Recent Labs  Lab 10/06/17 2320 10/07/17 0336  NA 141 142  K 3.5 3.4*  CL 101 103  CO2 28 28  GLUCOSE 117* 105*  BUN 22* 21*  CREATININE 1.24* 1.16*  CALCIUM 9.0 8.8*  GFRNONAA 37* 40*  GFRAA 42* 46*  ANIONGAP 12 11    Recent Labs  Lab 10/06/17 2320  PROT 6.1*  ALBUMIN 3.4*  AST 19  ALT 14  ALKPHOS 97  BILITOT 0.8   Hematology Recent Labs  Lab 10/06/17 2320 10/07/17 0336  WBC 9.4 10.7*  RBC 3.31* 3.30*  HGB 10.4* 10.2*  HCT 33.6* 33.3*  MCV 101.5* 100.9*  MCH 31.4 30.9  MCHC 31.0 30.6  RDW 14.4 14.3  PLT 191 179   Cardiac EnzymesNo results for input(s): TROPONINI in the last 168 hours. No results for input(s): TROPIPOC in  the last 168 hours.  BNP Recent Labs  Lab 10/06/17 2320  BNP 161.0*    DDimer No results for input(s): DDIMER in the last 168 hours.  Radiology/Studies:  Dg Chest 1 View  Result Date: 10/06/2017 CLINICAL DATA:  Preop, hip fracture EXAM: CHEST 1 VIEW COMPARISON:  07/11/2015 FINDINGS: Surgical clips in the right axilla. Left-sided pacing device as before. Small left-sided pleural effusion with left basilar atelectasis or pneumonia. Borderline heart size. Aortic atherosclerosis. No pneumothorax. IMPRESSION: Small left pleural effusion with left basilar atelectasis or pneumonia. Electronically Signed   By: Donavan Foil M.D.   On:  10/06/2017 23:43   Dg Hip Unilat W Or Wo Pelvis 2-3 Views Left  Result Date: 10/06/2017 CLINICAL DATA:  Fall with left hip pain EXAM: DG HIP (WITH OR WITHOUT PELVIS) 2-3V LEFT COMPARISON:  10/17/2015 FINDINGS: Status post right hip replacement with normal alignment. Acute displaced left femoral neck fracture with cephalad migration of the trochanter. Low lying bowel loop in the right pelvis raises possibility for inguinal hernia IMPRESSION: 1. Acute displaced left femoral neck fracture 2. Status post right hip replacement with normal alignment 3. Low lying right lower pelvic bowel loops, possible inguinal hernia. Electronically Signed   By: Donavan Foil M.D.   On: 10/06/2017 22:45    Assessment and Plan:   Chronic systolic heart failure -EF has been down to 45%, however most recent echocardiogram in 11/2016 showed a normalized EF 55-60%.  She is managed on Lasix 40 mg daily.  She also has CRT BiV pacemaker. -Echo 11/24/16 showed normal LVF with mild LVH and increased stiffness of heart muscle, mild MR, mildly dilated LA, mild pulmonary HTN with PASP 9mmHg  -The patient is clinically euvolemic.  Her symptoms are at baseline. -Continue her beta-blocker, Imdur, and Lasix 40 mg daily. -Monitor volume status closely.  Strict intake and output.  Daily weights.  CAD with chronic stable angina -Status post DES to LAD with residual disease of the diagonal that was too small for PCI by cath 01/2010 and then repeat cath with restenosis of the LAD status post PCI 10/2010.  The patient has occasional mild chest pressure lasting up to 5 minutes and relieved with nitroglycerin approximately 2 times per year.  The last occurrence was about 2 months ago. -Patient had low risk Myoview in 11/2015 -EKG is without ischemic changes -She is treated with aspirin 81 mg, carvedilol 6.25 mg twice daily, Imdur 90 mg daily, simvastatin 20 mg daily and Plavix 75 mg daily (which is now on hold for surgery). -Currently no chest  pain or increase in her usual dyspnea on exertion.  Hypertension -Blood pressures well controlled on current medications  Pulmonary hypertension -Mild at 41 mmHg by echo in April/2018 as reviewed by Dr. Radford Pax  Hyperlipidemia -LDL goal of less than 70. LDL was 51 in 12/2016. Continue statin.   Status post BiV PPM -CRT. Followed by Dr Caryl Comes.  CKD stage 3 -SCr baseline appears to be around 1.2-1.5. Today SCr is 1.16  Left femoral neck fracture -S/P fall -planned for hemiarthroplasty tomorrow -This patient has a revised cardiac risk index score of 2 giving her a 6.6% perioperative risk of major cardiac event.  At baseline she is functional but not very active, probably less than 4 mets. The patient understands the slight increase in risk.  She has had no recent change in her cardiac symptoms.  She is at acceptable risk for this necessary surgery and can proceed without any further cardiac testing. -  Cardiology will follow while she is in the hospital. -Dr. Acie Fredrickson to see pt..   For questions or updates, please contact [place] Please consult [email or web address] for contact info under Cardiology/STEMI.   Signed, Daune Perch, NP  10/07/2017 2:11 PM   Attending Note:   The patient was seen and examined.  Agree with assessment and plan as noted above.  Changes made to the above note as needed.  Patient seen and independently examined with Pecolia Ades, NP .   We discussed all aspects of the encounter. I agree with the assessment and plan as stated above.  1.  1.  Preoperative evaluation: Patient has a stable anginal pattern.  She takes nitroglycerin every 6 weeks or so.  This is not worsened in the past several weeks.  She fell and broke her hip.  There was no syncope.  Her EKG is nonacute.  Not in active heart failure.  She has a biventricular pacer and her left ventricular systolic function has normalized.  She should be in good condition for surgery.  I would place her at low to  moderate risk.   We will follow with you through her surgery and then on an as-needed basis.   I have spent a total of 40 minutes with patient reviewing hospital  notes , telemetry, EKGs, labs and examining patient as well as establishing an assessment and plan that was discussed with the patient. > 50% of time was spent in direct patient care.    Thayer Headings, Brooke Bonito., MD, [facility] 10/07/2017, 2:59 PM [address]  [place] Pager [phone]

## 2017-10-07 NOTE — ED Notes (Signed)
Re-paged Ortho

## 2017-10-07 NOTE — H&P (View-Only) (Signed)
Reason for Consult:Left femoral neck fx Referring Physician: Anayely Chang is an 84 y.o. female with multiple medical problems HPI: [name] was ambulating with a RW at home (independent living at [facility]) when she fell. She's not really sure what caused the fall but denies presyncope/syncope/dizziness/LOC. She had immediate pain and could not get up. She was brought to the ED for evaluation where x-rays showed a left femoral neck fx and orthopedic surgery was consulted.  Past Medical History:  Diagnosis Date  . Breast cancer ([place])    bilaterally  . CAD (coronary artery disease)    w/ DES to LAD with residual diagonal disease to small for PCI 6/11 and repeat DES to LAD for restenosis 3/12  . Cardiomyopathy ([place])    Mixed CM; s/p [employer] CRT-P implant 10/24 w/ LV lead revision 10/25 . Echo 08/2015 with EF 45%.    . Chronic systolic CHF (congestive heart failure) ([place])   . Dyslipidemia, goal LDL below 70   . GERD (gastroesophageal reflux disease)    occ  . Hematuria 02/23/2013   w/clots required hospital admission on 02/23/2013  . Hypertension   . LBBB (left bundle branch block)   . Macular degeneration   . Mild mitral regurgitation   . Osteoarthritis    "right knee" (02/23/2013)  . Osteopenia   . Pacemaker   . Pulmonary HTN ([place])    PASP 56mHg by echo 11/2016  . Umbilical hernia    Repaired on 01/10/2013   . Vitamin B12 deficiency     Past Surgical History:  Procedure Laterality Date  . BI-VENTRICULAR PACEMAKER INSERTION N/A 05/27/2012   Procedure: BI-VENTRICULAR PACEMAKER INSERTION (CRT-P);  Surgeon: SDeboraha Sprang MD;  [facility];  Service: Cardiovascular;  Laterality: N/A;  . BREAST BIOPSY  1995   bilaterally  . CATARACT EXTRACTION W/ INTRAOCULAR LENS  IMPLANT, BILATERAL  1990's  . CORONARY ANGIOPLASTY WITH STENT PLACEMENT  2010   "1" (05/27/2012)  . HERNIA REPAIR    . INSERT / REPLACE / REMOVE PACEMAKER  05/27/2012   initial placement - BiV PPM  .  INSERT / REPLACE / REMOVE PACEMAKER  05/31/2012   "lead change/repaired" (05/31/2012)  . INSERTION OF MESH N/A 01/10/2013   Procedure: INSERTION OF MESH;  Surgeon: CHaywood Lasso MD;  [facility]: [facility]  Service: General;  Laterality: N/A;  . JOINT REPLACEMENT    . LEAD REVISION N/A 05/28/2012   Procedure: LEAD REVISION;  Surgeon: GEvans Lance MD;  [facility];  Service: Cardiovascular;  Laterality: N/A;  . LEAD REVISION Left 05/31/2012   Procedure: LEAD REVISION;  Surgeon: SDeboraha Sprang MD;  [facility];  Service: Cardiovascular;  Laterality: Left;  .Marland KitchenMASTECTOMY  1995   bilaterally  . TONSILLECTOMY    . TOTAL HIP ARTHROPLASTY  2011   right  . UMBILICAL HERNIA REPAIR N/A 01/10/2013   Procedure: HERNIA REPAIR UMBILICAL ADULT;  Surgeon: CHaywood Lasso MD;  [facility];  Service: General;  Laterality: N/A;    Family History  Problem Relation Age of Onset  . Heart disease Mother   . Breast cancer Mother   . Heart disease Father   . Breast cancer Maternal Aunt   . Breast cancer Daughter   . Breast cancer Maternal Aunt   . Asthma Grandchild     Social History:  reports that she quit smoking about 54 years ago. Her smoking use included cigarettes. She started smoking about  74 years ago. She has a 20.00 pack-year smoking history. she has never used smokeless tobacco. She reports that she drinks about 3.0 oz of alcohol per week. She reports that she does not use drugs.  Allergies:  Allergies  Allergen Reactions  . Altace [Ramipril] Cough  . Celebrex [Celecoxib] Swelling and Cough    Reported by Dr. Andrew Au office on 07/22/2016.     Medications: I have reviewed the patient's current medications.  Results for orders placed or performed during the hospital encounter of 10/06/17 (from the past 48 hour(s))  Protime-INR     Status: None   Collection Time: 10/06/17 11:20 PM  Result Value Ref Range   Prothrombin Time 13.7 11.4 - 15.2 seconds   INR 1.06      Comment: Performed at [facility], [place] [address], [place], [place] [address]  Type and screen [place]     Status: None   Collection Time: 10/06/17 11:20 PM  Result Value Ref Range   ABO/RH(D) O NEG    Antibody Screen NEG    Sample Expiration      10/09/2017 Performed at [facility], [place] [address], [place], [place] [address]   CBC WITH DIFFERENTIAL     Status: Abnormal   Collection Time: 10/06/17 11:20 PM  Result Value Ref Range   WBC 9.4 4.0 - 10.5 K/uL   RBC 3.31 (L) 3.87 - 5.11 MIL/uL   Hemoglobin 10.4 (L) 12.0 - 15.0 g/dL   HCT 33.6 (L) 36.0 - 46.0 %   MCV 101.5 (H) 78.0 - 100.0 fL   MCH 31.4 26.0 - 34.0 pg   MCHC 31.0 30.0 - 36.0 g/dL   RDW 14.4 11.5 - 15.5 %   Platelets 191 150 - 400 K/uL   Neutrophils Relative % 82 %   Neutro Abs 7.7 1.7 - 7.7 K/uL   Lymphocytes Relative 11 %   Lymphs Abs 1.0 0.7 - 4.0 K/uL   Monocytes Relative 6 %   Monocytes Absolute 0.6 0.1 - 1.0 K/uL   Eosinophils Relative 1 %   Eosinophils Absolute 0.1 0.0 - 0.7 K/uL   Basophils Relative 0 %   Basophils Absolute 0.0 0.0 - 0.1 K/uL    Comment: Performed at [facility], [place] [address], [place], [place] [address]  Comprehensive metabolic panel     Status: Abnormal   Collection Time: 10/06/17 11:20 PM  Result Value Ref Range   Sodium 141 135 - 145 mmol/L   Potassium 3.5 3.5 - 5.1 mmol/L   Chloride 101 101 - 111 mmol/L   CO2 28 22 - 32 mmol/L   Glucose, Bld 117 (H) 65 - 99 mg/dL   BUN 22 (H) 6 - 20 mg/dL   Creatinine, Ser 1.24 (H) 0.44 - 1.00 mg/dL   Calcium 9.0 8.9 - 10.3 mg/dL   Total Protein 6.1 (L) 6.5 - 8.1 g/dL   Albumin 3.4 (L) 3.5 - 5.0 g/dL   AST 19 15 - 41 U/L   ALT 14 14 - 54 U/L   Alkaline Phosphatase 97 38 - 126 U/L   Total Bilirubin 0.8 0.3 - 1.2 mg/dL   GFR calc non Af Amer 37 (L) >60 mL/min   GFR calc Af Amer 42 (L) >60 mL/min    Comment: (NOTE) The eGFR has been calculated using the CKD EPI equation. This calculation has not  been validated in all clinical situations. eGFR's persistently <60 mL/min signify possible Chronic Kidney Disease.  Anion gap 12 5 - 15    Comment: Performed at [facility] [address] [place], [place] [address]  APTT     Status: None   Collection Time: 10/06/17 11:20 PM  Result Value Ref Range   aPTT 26 24 - 36 seconds    Comment: Performed at [facility] [address], [place], [place] [address]  Brain natriuretic peptide     Status: Abnormal   Collection Time: 10/06/17 11:20 PM  Result Value Ref Range   B Natriuretic Peptide 161.0 (H) 0.0 - 100.0 pg/mL    Comment: Performed at [facility] [address] [place], [place] [address]  Urinalysis, Routine w reflex microscopic     Status: Abnormal   Collection Time: 10/07/17  1:18 AM  Result Value Ref Range   Color, Urine YELLOW YELLOW   APPearance CLOUDY (A) CLEAR   Specific Gravity, Urine 1.013 1.005 - 1.030   pH 6.0 5.0 - 8.0   Glucose, UA NEGATIVE NEGATIVE mg/dL   Hgb urine dipstick NEGATIVE NEGATIVE   Bilirubin Urine NEGATIVE NEGATIVE   Ketones, ur NEGATIVE NEGATIVE mg/dL   Protein, ur NEGATIVE NEGATIVE mg/dL   Nitrite POSITIVE (A) NEGATIVE   Leukocytes, UA LARGE (A) NEGATIVE   RBC / HPF 0-5 0 - 5 RBC/hpf   WBC, UA TOO NUMEROUS TO COUNT 0 - 5 WBC/hpf   Bacteria, UA RARE (A) NONE SEEN   Squamous Epithelial / LPF NONE SEEN NONE SEEN    Comment: Performed at [facility], [place] [address] [place], [place] [address]  CBC     Status: Abnormal   Collection Time: 10/07/17  3:36 AM  Result Value Ref Range   WBC 10.7 (H) 4.0 - 10.5 K/uL   RBC 3.30 (L) 3.87 - 5.11 MIL/uL   Hemoglobin 10.2 (L) 12.0 - 15.0 g/dL   HCT 33.3 (L) 36.0 - 46.0 %   MCV 100.9 (H) 78.0 - 100.0 fL   MCH 30.9 26.0 - 34.0 pg   MCHC 30.6 30.0 - 36.0 g/dL   RDW 14.3 11.5 - 15.5 %   Platelets 179 150 - 400 K/uL    Comment: Performed at [facility], [place] [address] [place], [place] [address]  Basic metabolic panel      Status: Abnormal   Collection Time: 10/07/17  3:36 AM  Result Value Ref Range   Sodium 142 135 - 145 mmol/L   Potassium 3.4 (L) 3.5 - 5.1 mmol/L   Chloride 103 101 - 111 mmol/L   CO2 28 22 - 32 mmol/L   Glucose, Bld 105 (H) 65 - 99 mg/dL   BUN 21 (H) 6 - 20 mg/dL   Creatinine, Ser 1.16 (H) 0.44 - 1.00 mg/dL   Calcium 8.8 (L) 8.9 - 10.3 mg/dL   GFR calc non Af Amer 40 (L) >60 mL/min   GFR calc Af Amer 46 (L) >60 mL/min    Comment: (NOTE) The eGFR has been calculated using the CKD EPI equation. This calculation has not been validated in all clinical situations. eGFR's persistently <60 mL/min signify possible Chronic Kidney Disease.    Anion gap 11 5 - 15    Comment: Performed at [facility] [address] [place], [place] [address]    Dg Chest 1 View  Result Date: 10/06/2017 CLINICAL DATA:  Preop, hip fracture EXAM: CHEST 1 VIEW COMPARISON:  07/11/2015 FINDINGS: Surgical clips in the right axilla. Left-sided pacing device  as before. Small left-sided pleural effusion with left basilar atelectasis or pneumonia. Borderline heart size. Aortic atherosclerosis. No pneumothorax. IMPRESSION: Small left pleural effusion with left basilar atelectasis or pneumonia. Electronically Signed   By: Donavan Foil M.D.   On: 10/06/2017 23:43   Dg Hip Unilat W Or Wo Pelvis 2-3 Views Left  Result Date: 10/06/2017 CLINICAL DATA:  Fall with left hip pain EXAM: DG HIP (WITH OR WITHOUT PELVIS) 2-3V LEFT COMPARISON:  10/17/2015 FINDINGS: Status post right hip replacement with normal alignment. Acute displaced left femoral neck fracture with cephalad migration of the trochanter. Low lying bowel loop in the right pelvis raises possibility for inguinal hernia IMPRESSION: 1. Acute displaced left femoral neck fracture 2. Status post right hip replacement with normal alignment 3. Low lying right lower pelvic bowel loops, possible inguinal hernia. Electronically Signed   By: Donavan Foil M.D.   On: 10/06/2017  22:45    Review of Systems  Constitutional: Negative for weight loss.  HENT: Negative for ear discharge, ear pain, hearing loss and tinnitus.   Eyes: Negative for blurred vision, double vision, photophobia and pain.  Respiratory: Negative for cough, sputum production and shortness of breath.   Cardiovascular: Negative for chest pain.  Gastrointestinal: Negative for abdominal pain, nausea and vomiting.  Genitourinary: Negative for dysuria, flank pain, frequency and urgency.  Musculoskeletal: Positive for joint pain (Left hip). Negative for back pain, falls, myalgias and neck pain.  Neurological: Negative for dizziness, tingling, sensory change, focal weakness, loss of consciousness and headaches.  Endo/Heme/Allergies: Does not bruise/bleed easily.  Psychiatric/Behavioral: Negative for depression, memory loss and substance abuse. The patient is not nervous/anxious.    Blood pressure (!) 133/57, pulse 78, temperature 97.7 F (36.5 C), temperature source Oral, resp. rate 17, SpO2 96 %. Physical Exam  Constitutional: She appears well-developed and well-nourished. No distress.  HENT:  Head: Normocephalic and atraumatic.  Eyes: Conjunctivae are normal. Right eye exhibits no discharge. Left eye exhibits no discharge. No scleral icterus.  Neck: Normal range of motion.  Cardiovascular: Normal rate and regular rhythm.  Respiratory: Effort normal. No respiratory distress.  Musculoskeletal:  RLE No traumatic wounds, ecchymosis, or rash  Nontender  No knee or ankle effusion  Knee stable to varus/ valgus and anterior/posterior stress  Sens DPN, SPN, TN intact  Motor EHL, ext, flex, evers 5/5  DP 2+, PT 1+, No significant edema  LLE No traumatic wounds, ecchymosis, or rash  Severe TTP hip, leg short and externally rotated  No knee or ankle effusion  Knee stable to varus/ valgus and anterior/posterior stress  Sens DPN, SPN, TN intact  Motor EHL, ext, flex, evers 5/5  DP 2+, PT 1+, No  significant edema  Neurological: She is alert.  Skin: Skin is warm and dry. She is not diaphoretic.  Psychiatric: She has a normal mood and affect. Her behavior is normal.    Assessment/Plan: Fall Left femoral neck fx -- For hemiarthroplasty tomorrow by Dr. Rhona Raider pending cardiac clearance. Timing TBD. Please keep NPO after MN. May have diet today. Multiple medical problems -- per primary team    Lisette Abu, PA-C Orthopedic Surgery [phone] 10/07/2017, 9:04 AM

## 2017-10-07 NOTE — H&P (Signed)
History and Physical    [name] NLZ:[id number] DOB: [birth year] DOA: 10/06/2017  Referring MD/NP/PA:   PCP: Gaynelle Arabian, MD   Patient coming from:  The patient is coming from SNF.  At baseline, pt is dependent for most of ADL.      Chief Complaint: fall and left hip pain  HPI: [name] is a 84 y.o. female with medical history significant of hypertension, hyperlipidemia, GERD, pacemaker placement, MVR, left bundle blockage, dCHF, CAD, s/p of stent placement, bilateral breast cancer (s/p of bilateral mastectomy), pulmonary hypertension, GERD, CKD-3, who presents with fall and the left hip pain.  Pt states that she fell when she was walking toward TV using her walker and slipped. No LOC. She strongly denies any injury to head and the neck. She injured her left hip, and developed left hip pain, which is constant, sharp, 3 out of 10 in severity, nonradiating. No leg numbness. Patient does not have unilateral weakness, numbness or tingling to extremities. No facial droop or slurred speech. Denies chest pain, shortness of breath, nausea, vomiting, diarrhea, abdominal pain, symptoms of UTI.  ED Course: pt was found to have WBC 9.4, INR 1.06, stable renal function, temperature normal, no tachycardia, oxygen saturation 89-93 percent on room air, chest x-ray showed small left pleural effusion. X-ray of the pelvis/left hip showed acute displaced left femoral neck fracture. Patient is admitted to telemetry bed as inpatient. Orthopedic surgeon, Dr. Rhona Raider was consulted.  Review of Systems:   General: no fevers, chills, no body weight gain, has fatigue HEENT: no blurry vision, hearing changes or sore throat Respiratory: no dyspnea, coughing, wheezing CV: no chest pain, no palpitations GI: no nausea, vomiting, abdominal pain, diarrhea, constipation GU: no dysuria, burning on urination, increased urinary frequency, hematuria  Ext: has mild leg edema Neuro: no unilateral weakness, numbness,  or tingling, no vision change or hearing loss. Had fall. Skin: no rash, no skin tear. MSK: has left hip pain Heme: No easy bruising.  Travel history: No recent long distant travel.  Allergy:  Allergies  Allergen Reactions  . Altace [Ramipril] Cough  . Celebrex [Celecoxib] Swelling and Cough    Reported by Dr. Andrew Au office on 07/22/2016.     Past Medical History:  Diagnosis Date  . Breast cancer ([place])    bilaterally  . CAD (coronary artery disease)    w/ DES to LAD with residual diagonal disease to small for PCI 6/11 and repeat DES to LAD for restenosis 3/12  . Cardiomyopathy ([place])    Mixed CM; s/p St. Jude CRT-P implant 10/24 w/ LV lead revision 10/25 . Echo 08/2015 with EF 45%.    . Chronic systolic CHF (congestive heart failure) ([place])   . Dyslipidemia, goal LDL below 70   . GERD (gastroesophageal reflux disease)    occ  . Hematuria 02/23/2013   w/clots required hospital admission on 02/23/2013  . Hypertension   . LBBB (left bundle branch block)   . Macular degeneration   . Mild mitral regurgitation   . Osteoarthritis    "right knee" (02/23/2013)  . Osteopenia   . Pacemaker   . Pulmonary HTN ([place])    PASP 66mmHg by echo 11/2016  . Umbilical hernia    Repaired on 01/10/2013   . Vitamin B12 deficiency     Past Surgical History:  Procedure Laterality Date  . BI-VENTRICULAR PACEMAKER INSERTION N/A 05/27/2012   Procedure: BI-VENTRICULAR PACEMAKER INSERTION (CRT-P);  Surgeon: Deboraha Sprang, MD;  Location: [facility] CATH LAB;  Service: Cardiovascular;  Laterality: N/A;  . BREAST BIOPSY  1995   bilaterally  . CATARACT EXTRACTION W/ INTRAOCULAR LENS  IMPLANT, BILATERAL  1990's  . CORONARY ANGIOPLASTY WITH STENT PLACEMENT  2010   "1" (05/27/2012)  . HERNIA REPAIR    . INSERT / REPLACE / REMOVE PACEMAKER  05/27/2012   initial placement - BiV PPM  . INSERT / REPLACE / REMOVE PACEMAKER  05/31/2012   "lead change/repaired" (05/31/2012)  . INSERTION OF MESH N/A 01/10/2013    Procedure: INSERTION OF MESH;  Surgeon: Haywood Lasso, MD;  Location: [place];  Service: General;  Laterality: N/A;  . JOINT REPLACEMENT    . LEAD REVISION N/A 05/28/2012   Procedure: LEAD REVISION;  Surgeon: Evans Lance, MD;  Location: [facility] CATH LAB;  Service: Cardiovascular;  Laterality: N/A;  . LEAD REVISION Left 05/31/2012   Procedure: LEAD REVISION;  Surgeon: Deboraha Sprang, MD;  Location: [facility] CATH LAB;  Service: Cardiovascular;  Laterality: Left;  Marland Kitchen MASTECTOMY  1995   bilaterally  . TONSILLECTOMY    . TOTAL HIP ARTHROPLASTY  2011   right  . UMBILICAL HERNIA REPAIR N/A 01/10/2013   Procedure: HERNIA REPAIR UMBILICAL ADULT;  Surgeon: Haywood Lasso, MD;  Location[place];  Service: General;  Laterality: N/A;    Social History:  reports that she quit smoking about 54 years ago. Her smoking use included cigarettes. She started smoking about 74 years ago. She has a 20.00 pack-year smoking history. she has never used smokeless tobacco. She reports that she drinks about 3.0 oz of alcohol per week. She reports that she does not use drugs.  Family History:  Family History  Problem Relation Age of Onset  . Heart disease Mother   . Breast cancer Mother   . Heart disease Father   . Breast cancer Maternal Aunt   . Breast cancer Daughter   . Breast cancer Maternal Aunt   . Asthma Grandchild      Prior to Admission medications   Medication Sig Start Date End Date Taking? Authorizing Provider  albuterol (PROVENTIL HFA;VENTOLIN HFA) 108 (90 Base) MCG/ACT inhaler Inhale 1-2 puffs into the lungs every 6 (six) hours as needed for wheezing or shortness of breath.   Yes [provider]  carvedilol (COREG) 6.25 MG tablet TAKE ONE TABLET TWICE DAILY 05/13/17  Yes Turner, Eber Hong, MD  Cholecalciferol (VITAMIN D-3) 5000 UNITS TABS Take 5,000 Units by mouth daily.    Yes [provider]  clopidogrel (PLAVIX) 75 MG tablet TAKE ONE TABLET BY MOUTH ONCE DAILY 12/10/16  Yes Turner, Eber Hong, MD  furosemide (LASIX) 40 MG tablet Take 1 tablet (40 mg total) by mouth daily. 11/27/16 11/22/17 Yes Turner, Eber Hong, MD  ibuprofen (ADVIL,MOTRIN) 200 MG tablet Take 200 mg by mouth every 6 (six) hours as needed for moderate pain.    Yes [provider]  isosorbide mononitrate (IMDUR) 60 MG 24 hr tablet TAKE ONE AND ONE-HALF TABLETS DAILY 04/22/17  Yes Turner, Eber Hong, MD  nitroGLYCERIN (NITROSTAT) 0.4 MG SL tablet Place 1 tablet (0.4 mg total) every 5 (five) minutes as needed under the tongue for chest pain. 06/09/17  Yes Turner, Eber Hong, MD  simvastatin (ZOCOR) 20 MG tablet TAKE ONE TABLET EACH DAY AT 6PM 04/22/17  Yes Sueanne Margarita, MD  aspirin EC 81 MG tablet Take 81 mg by mouth daily.    [provider]    Physical Exam: Vitals:   10/06/17 2330  10/07/17 0000 10/07/17 0030 10/07/17 0100  BP: (!) 164/71 (!) 159/70 (!) 146/95 (!) 162/75  Pulse: 76 84 77 79  Resp: 17 12 10 16   Temp:      TempSrc:      SpO2: 92%  98% 98%   General: Not in acute distress HEENT:       Eyes: PERRL, EOMI, no scleral icterus.       ENT: No discharge from the ears and nose, no pharynx injection, no tonsillar enlargement.        Neck: No JVD, no bruit, no mass felt. Heme: No neck lymph node enlargement. Cardiac: S1/S2, RRR, No murmurs, No gallops or rubs. Respiratory:  No rales, wheezing, rhonchi or rubs. GI: Soft, nondistended, nontender, no rebound pain, no organomegaly, BS present. GU: No hematuria Ext: has trace leg edema bilaterally. 2+DP/PT pulse bilaterally. Musculoskeletal: has left hip tenderness. Left leg is externally rotated.  Skin: No rashes.  Neuro: Alert, oriented X3, cranial nerves II-XII grossly intact, moves all extremities normally.  Psych: Patient is not psychotic, no suicidal or hemocidal ideation.  Labs on Admission: I have personally reviewed following labs and imaging studies  CBC: Recent Labs  Lab 10/06/17 2320  WBC 9.4  NEUTROABS 7.7  HGB 10.4*  HCT  33.6*  MCV 101.5*  PLT 250   Basic Metabolic Panel: Recent Labs  Lab 10/06/17 2320  NA 141  K 3.5  CL 101  CO2 28  GLUCOSE 117*  BUN 22*  CREATININE 1.24*  CALCIUM 9.0   GFR: CrCl cannot be calculated (Unknown ideal weight.). Liver Function Tests: Recent Labs  Lab 10/06/17 2320  AST 19  ALT 14  ALKPHOS 97  BILITOT 0.8  PROT 6.1*  ALBUMIN 3.4*   No results for input(s): LIPASE, AMYLASE in the last 168 hours. No results for input(s): AMMONIA in the last 168 hours. Coagulation Profile: Recent Labs  Lab 10/06/17 2320  INR 1.06   Cardiac Enzymes: No results for input(s): CKTOTAL, CKMB, CKMBINDEX, TROPONINI in the last 168 hours. BNP (last 3 results) No results for input(s): PROBNP in the last 8760 hours. HbA1C: No results for input(s): HGBA1C in the last 72 hours. CBG: No results for input(s): GLUCAP in the last 168 hours. Lipid Profile: No results for input(s): CHOL, HDL, LDLCALC, TRIG, CHOLHDL, LDLDIRECT in the last 72 hours. Thyroid Function Tests: No results for input(s): TSH, T4TOTAL, FREET4, T3FREE, THYROIDAB in the last 72 hours. Anemia Panel: No results for input(s): VITAMINB12, FOLATE, FERRITIN, TIBC, IRON, RETICCTPCT in the last 72 hours. Urine analysis:    Component Value Date/Time   COLORURINE YELLOW 10/07/2017 0118   APPEARANCEUR CLOUDY (A) 10/07/2017 0118   LABSPEC 1.013 10/07/2017 0118   PHURINE 6.0 10/07/2017 0118   GLUCOSEU NEGATIVE 10/07/2017 0118   HGBUR NEGATIVE 10/07/2017 0118   BILIRUBINUR NEGATIVE 10/07/2017 0118   KETONESUR NEGATIVE 10/07/2017 0118   PROTEINUR NEGATIVE 10/07/2017 0118   UROBILINOGEN 0.2 02/23/2013 0956   NITRITE POSITIVE (A) 10/07/2017 0118   LEUKOCYTESUR LARGE (A) 10/07/2017 0118   Sepsis Labs: @LABRCNTIP (procalcitonin:4,lacticidven:4) )No results found for this or any previous visit (from the past 240 hour(s)).   Radiological Exams on Admission: Dg Chest 1 View  Result Date: 10/06/2017 CLINICAL DATA:   Preop, hip fracture EXAM: CHEST 1 VIEW COMPARISON:  07/11/2015 FINDINGS: Surgical clips in the right axilla. Left-sided pacing device as before. Small left-sided pleural effusion with left basilar atelectasis or pneumonia. Borderline heart size. Aortic atherosclerosis. No pneumothorax. IMPRESSION: Small left pleural effusion  with left basilar atelectasis or pneumonia. Electronically Signed   By: Donavan Foil M.D.   On: 10/06/2017 23:43   Dg Hip Unilat W Or Wo Pelvis 2-3 Views Left  Result Date: 10/06/2017 CLINICAL DATA:  Fall with left hip pain EXAM: DG HIP (WITH OR WITHOUT PELVIS) 2-3V LEFT COMPARISON:  10/17/2015 FINDINGS: Status post right hip replacement with normal alignment. Acute displaced left femoral neck fracture with cephalad migration of the trochanter. Low lying bowel loop in the right pelvis raises possibility for inguinal hernia IMPRESSION: 1. Acute displaced left femoral neck fracture 2. Status post right hip replacement with normal alignment 3. Low lying right lower pelvic bowel loops, possible inguinal hernia. Electronically Signed   By: Donavan Foil M.D.   On: 10/06/2017 22:45     EKG: Independently reviewed. QTC 500, LAD, left bundle blockage, poor R-wave progression, PAC   Assessment/Plan Principal Problem:   Closed displaced fracture of left femoral neck (HCC) Active Problems:   Left bundle branch block   Chronic diastolic CHF (congestive heart failure) (HCC)   Pacemaker-Medtronic-CRT   CAD (coronary artery disease)   Dyslipidemia   Benign essential HTN   HLD (hyperlipidemia)   Fall   CKD (chronic kidney disease), stage III (HCC)   Closed displaced fracture of left femoral neck: As evidenced by x-ray. Patient has moderate pain now. No neurovascular compromise. Orthopedic surgeon, dr. Rhona Raider was consulted.   - will admit to tele bed as inpt - Pain control: morphine prn and percocet - When necessary hydroxyzine for nausea - Robaxin for muscle spasm - type and  cross - INR/PTT - PT/OT when able to (not ordered now) - will request card consult for presurgical clearance due to multiple cardiac issues (request was put in Epic)  Fall: pt seems to have had a mechanical fall. Patient strongly denies injury to head and the neck. Refused CT head and the neck. -- PT/OT when able to (not ordered now  Chronic diastolic CHF (congestive heart failure) ([place]): 2-D echo on 11/24/16 showed EF of 55-60% with grade 1 diastolic dysfunction. Patient has trace amount of leg edema, but no JVD. No shortness of breath. CHF is compensated. -Continue home Lasix 40 mg daily -check BNP  CAD (coronary artery disease): s/p of stent. No CP -Hold plavix for surgery -continue ASA and zocor, coreg, Imdur -prn NTG  HLD: -Zocor  HTN:  -Continue home medications: Coreg -IV hydralazine prn  CKD (chronic kidney disease), stage III ([place]): stable. Baseline creatinine 1.1-1.3. Her creatinine is 1.24, BUN 22. -Follow-up renal function by BMP   DVT ppx: SCD Code Status: DNR (I discussed with patient in the presence of her daughter, and explained the meaning of CODE STATUS. Patient wants to be DNR) Family Communication: Yes, patient's  daughter at bed side Disposition Plan:  Anticipate discharge back to previous SNF environment Consults called:  Ortho, Dr. Rhona Raider Admission status:  Inpatient/tele   Date of Service 10/07/2017    Ivor Costa [facility] Pager [phone]  If 7PM-7AM, please contact night-coverage [email or web address] Password [email or web address] 10/07/2017, 2:02 AM

## 2017-10-07 NOTE — ED Notes (Signed)
Dr. Blaine Hamper ( hospitalist) explained admission and plan of care to pt. and family .

## 2017-10-07 NOTE — Care Management Note (Signed)
Case Management Note  Patient Details  Name: SHAQUNA GEIGLE MRN: [id number] Date of Birth: [birth year]  Subjective/Objective:                  84 y.o. female with PMHx:t of hypertension, hyperlipidemia, GERD, pacemaker placement, MVR, left bundle blockage, dCHF, CAD, s/p of stent placement, bilateral breast cancer (s/p of bilateral mastectomy), pulmonary hypertension, GERD, CKD, who presents with fall and the left hip pain.  From [facility].  Action/Plan: Admit status INPATIENT (Closed displaced fracture of left femoral neck); anticipate discharge RETURN TO ILF WITH HH VS SNF.   Expected Discharge Date:  (unknown)               Expected Discharge Plan:  [place]  In-House Referral:  Clinical Social Work  Discharge planning Services  CM Consult  Post Acute Care Choice:  Home Health Choice offered to:     DME Arranged:    DME Agency:     HH Arranged:    [place] Agency:     Status of Service:  In process, will continue to follow  If discussed at Long Length of Stay Meetings, dates discussed:    Additional Comments: PCP: Shelia Media, RN 10/07/2017, 8:39 AM

## 2017-10-07 NOTE — Clinical Social Work Note (Signed)
Clinical Social Work Assessment  Patient Details  Name: [name] MRN: [id number] Date of Birth: 1923-02-27  Date of referral:  10/07/17               Reason for consult:  Facility Placement(from [facility]. )                Permission sought to share information with:  Family Supports Permission granted to share information::  Yes, Verbal Permission Granted  Name::     [name]   Agency::  family  Relationship::  daughter  Contact Information:  [name] [phone]  Housing/Transportation Living arrangements for the past 2 months:  Independent Living Facility([facility]) Source of Information:  Patient Patient Interpreter Needed:  None Criminal Activity/Legal Involvement Pertinent to Current Situation/Hospitalization:  No - Comment as needed Significant Relationships:  Adult Children Lives with:  Self, Facility Resident Do you feel safe going back to the place where you live?  Yes Need for family participation in patient care:  Yes (Comment)  Care giving concerns:  CSW spoke with pt and daughter at bedside. At this time pt nor daughter expressed having any concerns regarding care or placement.   wke with pt at bedside. During this time CSW was informed that pt is from [place] here pt reports pt has lived at for 5 and a half years. Pt reports that pt is from the Independent living part of [facility] and has high hopes to return to this level of care with [facility] services. Pt was very engaged in speaking with CSW as well as presented as being calm and understanding of what potential needs may be at the time of discharge.  Employment status:  Retired Forensic scientist:  Medicare PT Recommendations:  Not assessed at this time [place] / Referral to community resources:  Other (Comment Required)(spoke with pt about SNF facilties at the time of discharge. )  Patient/Family's Response to care:  Pt an daughter response to care was understanding and willingness to make any  needed adjustments to pt's home life so that pt may be able to return at the time of discharge.   Patient/Family's Understanding of and Emotional Response to Diagnosis, Current Treatment, and Prognosis:  Pt and daughter's response to care was understanding and being adaptable to what pt's needs may be at the time of discharge.   Emotional Assessment Appearance:  Appears stated age Attitude/Demeanor/Rapport:  Engaged Affect (typically observed):  Accepting, Appropriate, Pleasant Orientation:  Oriented to Self, Oriented to Place, Oriented to  Time, Oriented to Situation Alcohol / Substance use:  Not Applicable Psych involvement (Current and /or in the community):  No (Comment)  Discharge Needs  Concerns to be addressed:  Care Coordination Readmission within the last 30 days:  No Current discharge risk:  Dependent with Mobility, Lives alone Barriers to Discharge:  Continued Medical Work up   [employer], [place] 10/07/2017, 7:35 AM

## 2017-10-07 NOTE — Progress Notes (Signed)
Orthopedic Tech Progress Note Patient Details:  [name] [birth year] [id number]  Musculoskeletal Traction Type of Traction: [place] Skin Traction Traction Location: lle Traction Weight: 10 lbs   Post Interventions Patient Tolerated: Well Instructions Provided: Care of device   Hildred Priest 10/07/2017, 4:04 PM

## 2017-10-07 NOTE — Progress Notes (Signed)
Initial Nutrition Assessment  DOCUMENTATION CODES:   Obesity unspecified  INTERVENTION:   - MVI with minerals daily - Magic cup once daily with evening meal, each supplement provides 290 kcal and 9 grams of protein  NUTRITION DIAGNOSIS:   Increased nutrient needs related to hip fracture as evidenced by estimated needs.  GOAL:   Patient will meet greater than or equal to 90% of their needs  MONITOR:   PO intake, Supplement acceptance, Labs  REASON FOR ASSESSMENT:   Consult Assessment of nutrition requirement/status  ASSESSMENT:   84 year old female who presented to ED from assisted living facility after falling. Pt with PMH significant for CAD s/p stent placement, breast cancer s/p bilateral mastectomy, CKD stage 3, CHF, HLD, GERD, HTN, osteopenia, and vitamin B12 deficiency. Pt admitted with left femoral neck fracture.  RD noted plan for hemiarthroplasty tomorrow (10/08/17) pending cardiac clearance. Cardiologist leaving room at time of RD visit.  Spoke with pt at bedside. Pt reports having a decreased appetite for 2-3 weeks PTA. Pt reports still eating 3 meals/day. A typical breakfast includes toast and fresh fruit. A typical lunch includes cottage cheese and fresh fruit. A typical dinner includes soup, salad, and a choice of entree.  RD noted pt had completed 100% of soup on lunch tray. Pt asked RD to open salad plate and dressing packs. Pt eating salad at end of RD visit.  Pt would not like to receive Ensure Enlive oral nutrition supplement but is interested in [employer]. RD to order once daily with supper.  Pt unsure of UBW and current weight as she does not weigh herself. Pt denies clothes fitting more loosely and does not think she has lost weight. Per weight history in chart, pt's weight has been stable between 80-83.7 kg over the past year.  Medications reviewed and include: 40 mg Lasix daily  Labs reviewed: potassium 3.4 (L), BUN 21 (H), creatinine 1.16 (H), calcium  8.8 (L), hemoglobin 10.2 (L), glucose 105 (H)  NUTRITION - FOCUSED PHYSICAL EXAM:    Most Recent Value  Orbital Region  No depletion  Upper Arm Region  No depletion  Thoracic and Lumbar Region  No depletion  Buccal Region  No depletion  Temple Region  Mild depletion  Clavicle and Acromion Bone Region  No depletion  Scapular Bone Region  No depletion  Dorsal Hand  Mild depletion  Patellar Region  No depletion  Anterior Thigh Region  No depletion  Posterior Calf Region  Unable to assess [severe edema]  Edema (RD Assessment)  Severe [non-pitting]  Hair  Reviewed  Eyes  Reviewed  Mouth  Reviewed  Skin  Reviewed  Nails  Reviewed       Diet Order:  Diet NPO time specified Diet regular Room service appropriate? Yes; Fluid consistency: Thin  EDUCATION NEEDS:   No education needs have been identified at this time  Skin:  Skin Assessment: Reviewed RN Assessment(no information in RN assessment)  Last BM:  Unknown  Height:   Ht Readings from Last 1 Encounters:  06/09/17 5\' 4"  (1.626 m)    Weight:   Wt Readings from Last 1 Encounters:  06/09/17 184 lb 9.6 oz (83.7 kg)    Ideal Body Weight:  54.5 kg(calculated using height from 06/2017)  BMI: 31.7 kg/m^2 (calculated using height and weight from 06/2017)  Estimated Nutritional Needs:   Kcal:  1600-1800(calculated using weight from 06/2017)  Protein:  100-115 grams/day (calculated using weight from 06/2017)  Fluid:  2.0 L/day  Kate Jablonski Tykeshia Tourangeau, MS, RD, LDN Pager: [phone] Weekend/After Hours: [phone]  

## 2017-10-08 ENCOUNTER — Inpatient Hospital Stay (HOSPITAL_COMMUNITY): Payer: Medicare Other | Admitting: Certified Registered"

## 2017-10-08 ENCOUNTER — Encounter (HOSPITAL_COMMUNITY)
Admission: EM | Disposition: A | Discharge status: Skilled Nursing Facility | Payer: Self-pay | Source: Home / Self Care | Attending: Internal Medicine

## 2017-10-08 ENCOUNTER — Inpatient Hospital Stay (HOSPITAL_COMMUNITY): Payer: Medicare Other

## 2017-10-08 ENCOUNTER — Encounter (HOSPITAL_COMMUNITY): Payer: Self-pay | Admitting: Orthopedic Surgery

## 2017-10-08 DIAGNOSIS — E876 Hypokalemia: Secondary | ICD-10-CM

## 2017-10-08 DIAGNOSIS — I447 Left bundle-branch block, unspecified: Secondary | ICD-10-CM

## 2017-10-08 DIAGNOSIS — R8271 Bacteriuria: Secondary | ICD-10-CM

## 2017-10-08 DIAGNOSIS — D649 Anemia, unspecified: Secondary | ICD-10-CM

## 2017-10-08 HISTORY — PX: HIP ARTHROPLASTY: SHX981

## 2017-10-08 LAB — GLUCOSE, CAPILLARY: Glucose-Capillary: 115 mg/dL — ABNORMAL HIGH (ref 65–99)

## 2017-10-08 LAB — BASIC METABOLIC PANEL
Anion gap: 10 (ref 5–15)
BUN: 21 mg/dL — ABNORMAL HIGH (ref 6–20)
CO2: 30 mmol/L (ref 22–32)
Calcium: 8.7 mg/dL — ABNORMAL LOW (ref 8.9–10.3)
Chloride: 103 mmol/L (ref 101–111)
Creatinine, Ser: 1.16 mg/dL — ABNORMAL HIGH (ref 0.44–1.00)
GFR calc Af Amer: 46 mL/min — ABNORMAL LOW (ref >60)
GFR calc non Af Amer: 40 mL/min — ABNORMAL LOW (ref >60)
Glucose, Bld: 105 mg/dL — ABNORMAL HIGH (ref 65–99)
Potassium: 3.4 mmol/L — ABNORMAL LOW (ref 3.5–5.1)
Sodium: 143 mmol/L (ref 135–145)

## 2017-10-08 LAB — CBC
HCT: 32.1 % — ABNORMAL LOW (ref 36.0–46.0)
Hemoglobin: 10 g/dL — ABNORMAL LOW (ref 12.0–15.0)
MCH: 32.2 pg (ref 26.0–34.0)
MCHC: 31.2 g/dL (ref 30.0–36.0)
MCV: 103.2 fL — ABNORMAL HIGH (ref 78.0–100.0)
Platelets: 156 10*3/uL (ref 150–400)
RBC: 3.11 MIL/uL — ABNORMAL LOW (ref 3.87–5.11)
RDW: 15.2 % (ref 11.5–15.5)
WBC: 8.7 10*3/uL (ref 4.0–10.5)

## 2017-10-08 LAB — SURGICAL PCR SCREEN
MRSA, PCR: NEGATIVE
Staphylococcus aureus: NEGATIVE

## 2017-10-08 SURGERY — HEMIARTHROPLASTY, HIP, DIRECT ANTERIOR APPROACH, FOR FRACTURE
Anesthesia: General | Site: Hip | Laterality: Left

## 2017-10-08 MED ORDER — MAGNESIUM SULFATE 50 % IJ SOLN: 2.0000 g | Freq: Once | INTRAMUSCULAR | Status: DC

## 2017-10-08 MED ORDER — CEFAZOLIN SODIUM-DEXTROSE 2-4 GM/100ML-% IV SOLN
INTRAVENOUS | Status: AC
  Filled 2017-10-08: qty 100

## 2017-10-08 MED ORDER — CLOPIDOGREL BISULFATE 75 MG PO TABS
75.0000 mg | ORAL_TABLET | Freq: Every day | ORAL | Status: DC
  Administered 2017-10-09: 75 mg via ORAL
  Filled 2017-10-08: qty 1

## 2017-10-08 MED ORDER — FENTANYL CITRATE (PF) 100 MCG/2ML IJ SOLN
INTRAMUSCULAR | Status: DC | PRN
  Administered 2017-10-08 (×2): 50 ug via INTRAVENOUS

## 2017-10-08 MED ORDER — 0.9 % SODIUM CHLORIDE (POUR BTL) OPTIME
TOPICAL | Status: DC | PRN
  Administered 2017-10-08: 1000 mL

## 2017-10-08 MED ORDER — BUPIVACAINE-EPINEPHRINE 0.5% -1:200000 IJ SOLN
INTRAMUSCULAR | Status: DC | PRN
  Administered 2017-10-08: 20 mL

## 2017-10-08 MED ORDER — PROPOFOL 10 MG/ML IV BOLUS
INTRAVENOUS | Status: AC
  Filled 2017-10-08: qty 20

## 2017-10-08 MED ORDER — MENTHOL 3 MG MT LOZG: 1.0000 | LOZENGE | OROMUCOSAL | Status: DC | PRN

## 2017-10-08 MED ORDER — ROCURONIUM BROMIDE 100 MG/10ML IV SOLN
INTRAVENOUS | Status: DC | PRN
  Administered 2017-10-08: 40 mg via INTRAVENOUS

## 2017-10-08 MED ORDER — ACETAMINOPHEN 500 MG PO TABS
500.0000 mg | ORAL_TABLET | Freq: Four times a day (QID) | ORAL | Status: AC
  Administered 2017-10-08 – 2017-10-09 (×2): 500 mg via ORAL
  Filled 2017-10-08 (×3): qty 1

## 2017-10-08 MED ORDER — BUPIVACAINE-EPINEPHRINE (PF) 0.5% -1:200000 IJ SOLN
INTRAMUSCULAR | Status: AC
  Filled 2017-10-08: qty 30

## 2017-10-08 MED ORDER — HYDROCODONE-ACETAMINOPHEN 5-325 MG PO TABS
1.0000 | ORAL_TABLET | ORAL | Status: DC | PRN
  Administered 2017-10-09: 2 via ORAL
  Administered 2017-10-09 – 2017-10-11 (×6): 1 via ORAL
  Filled 2017-10-08: qty 2
  Filled 2017-10-08 (×4): qty 1
  Filled 2017-10-08 (×2): qty 2

## 2017-10-08 MED ORDER — ONDANSETRON HCL 4 MG/2ML IJ SOLN
INTRAMUSCULAR | Status: DC | PRN
  Administered 2017-10-08: 4 mg via INTRAVENOUS

## 2017-10-08 MED ORDER — BUPIVACAINE LIPOSOME 1.3 % IJ SUSP
20.0000 mL | Freq: Once | INTRAMUSCULAR | Status: DC
  Filled 2017-10-08: qty 20

## 2017-10-08 MED ORDER — METOCLOPRAMIDE HCL 5 MG PO TABS: 5.0000 mg | ORAL_TABLET | Freq: Three times a day (TID) | ORAL | Status: DC | PRN

## 2017-10-08 MED ORDER — ONDANSETRON HCL 4 MG PO TABS: 4.0000 mg | ORAL_TABLET | Freq: Four times a day (QID) | ORAL | Status: DC | PRN

## 2017-10-08 MED ORDER — SUGAMMADEX SODIUM 200 MG/2ML IV SOLN
INTRAVENOUS | Status: AC
  Filled 2017-10-08: qty 2

## 2017-10-08 MED ORDER — LACTATED RINGERS IV SOLN
INTRAVENOUS | Status: DC | PRN
  Administered 2017-10-08 (×2): via INTRAVENOUS

## 2017-10-08 MED ORDER — PHENYLEPHRINE HCL 10 MG/ML IJ SOLN
INTRAVENOUS | Status: DC | PRN
  Administered 2017-10-08: 75 ug/min via INTRAVENOUS

## 2017-10-08 MED ORDER — LIDOCAINE 2% (20 MG/ML) 5 ML SYRINGE
INTRAMUSCULAR | Status: AC
  Filled 2017-10-08: qty 5

## 2017-10-08 MED ORDER — MAGNESIUM SULFATE 50 % IJ SOLN: 1.0000 g | Freq: Once | INTRAMUSCULAR | Status: DC

## 2017-10-08 MED ORDER — LACTATED RINGERS IV SOLN: INTRAVENOUS | Status: DC

## 2017-10-08 MED ORDER — POTASSIUM CHLORIDE CRYS ER 20 MEQ PO TBCR
40.0000 meq | EXTENDED_RELEASE_TABLET | Freq: Once | ORAL | Status: AC
  Administered 2017-10-08: 40 meq via ORAL
  Filled 2017-10-08: qty 2

## 2017-10-08 MED ORDER — METOCLOPRAMIDE HCL 5 MG/ML IJ SOLN: 5.0000 mg | Freq: Three times a day (TID) | INTRAMUSCULAR | Status: DC | PRN

## 2017-10-08 MED ORDER — ONDANSETRON HCL 4 MG/2ML IJ SOLN: 4.0000 mg | Freq: Four times a day (QID) | INTRAMUSCULAR | Status: DC | PRN

## 2017-10-08 MED ORDER — FENTANYL CITRATE (PF) 250 MCG/5ML IJ SOLN
INTRAMUSCULAR | Status: AC
  Filled 2017-10-08: qty 5

## 2017-10-08 MED ORDER — ALBUMIN HUMAN 5 % IV SOLN
INTRAVENOUS | Status: DC | PRN
  Administered 2017-10-08: 15:00:00 via INTRAVENOUS

## 2017-10-08 MED ORDER — CEFAZOLIN SODIUM-DEXTROSE 2-3 GM-%(50ML) IV SOLR
INTRAVENOUS | Status: DC | PRN
  Administered 2017-10-08: 2 g via INTRAVENOUS

## 2017-10-08 MED ORDER — LIDOCAINE HCL (CARDIAC) 20 MG/ML IV SOLN
INTRAVENOUS | Status: DC | PRN
  Administered 2017-10-08: 100 mg via INTRAVENOUS

## 2017-10-08 MED ORDER — SODIUM CHLORIDE 0.9 % IR SOLN
Status: DC | PRN
  Administered 2017-10-08: 3000 mL

## 2017-10-08 MED ORDER — HYDROCODONE-ACETAMINOPHEN 7.5-325 MG PO TABS: 1.0000 | ORAL_TABLET | ORAL | Status: DC | PRN

## 2017-10-08 MED ORDER — CEFAZOLIN SODIUM-DEXTROSE 2-4 GM/100ML-% IV SOLN
2.0000 g | Freq: Four times a day (QID) | INTRAVENOUS | Status: AC
  Administered 2017-10-08: 2 g via INTRAVENOUS
  Filled 2017-10-08: qty 100

## 2017-10-08 MED ORDER — DOCUSATE SODIUM 100 MG PO CAPS
100.0000 mg | ORAL_CAPSULE | Freq: Two times a day (BID) | ORAL | Status: DC
  Administered 2017-10-08 – 2017-10-12 (×8): 100 mg via ORAL
  Filled 2017-10-08 (×8): qty 1

## 2017-10-08 MED ORDER — PROPOFOL 10 MG/ML IV BOLUS
INTRAVENOUS | Status: DC | PRN
  Administered 2017-10-08: 100 mg via INTRAVENOUS

## 2017-10-08 MED ORDER — ONDANSETRON HCL 4 MG/2ML IJ SOLN
INTRAMUSCULAR | Status: AC
  Filled 2017-10-08: qty 2

## 2017-10-08 MED ORDER — MORPHINE SULFATE (PF) 2 MG/ML IV SOLN: 0.5000 mg | INTRAVENOUS | Status: DC | PRN

## 2017-10-08 MED ORDER — BUPIVACAINE LIPOSOME 1.3 % IJ SUSP
INTRAMUSCULAR | Status: DC | PRN
  Administered 2017-10-08: 20 mL

## 2017-10-08 MED ORDER — PHENYLEPHRINE 40 MCG/ML (10ML) SYRINGE FOR IV PUSH (FOR BLOOD PRESSURE SUPPORT)
PREFILLED_SYRINGE | INTRAVENOUS | Status: AC
  Filled 2017-10-08: qty 10

## 2017-10-08 MED ORDER — ASPIRIN EC 325 MG PO TBEC
325.0000 mg | DELAYED_RELEASE_TABLET | Freq: Every day | ORAL | Status: DC
  Administered 2017-10-09 – 2017-10-10 (×2): 325 mg via ORAL
  Filled 2017-10-08 (×2): qty 1

## 2017-10-08 MED ORDER — PHENYLEPHRINE HCL 10 MG/ML IJ SOLN
INTRAMUSCULAR | Status: DC | PRN
  Administered 2017-10-08 (×3): 200 ug via INTRAVENOUS
  Administered 2017-10-08: 80 ug via INTRAVENOUS

## 2017-10-08 MED ORDER — MAGNESIUM SULFATE 2 GM/50ML IV SOLN
2.0000 g | Freq: Once | INTRAVENOUS | Status: AC
  Administered 2017-10-09: 2 g via INTRAVENOUS
  Filled 2017-10-08: qty 50

## 2017-10-08 MED ORDER — ACETAMINOPHEN 325 MG PO TABS
325.0000 mg | ORAL_TABLET | Freq: Four times a day (QID) | ORAL | Status: DC | PRN
  Administered 2017-10-10 – 2017-10-11 (×3): 650 mg via ORAL
  Filled 2017-10-08 (×3): qty 2

## 2017-10-08 MED ORDER — PHENOL 1.4 % MT LIQD: 1.0000 | OROMUCOSAL | Status: DC | PRN

## 2017-10-08 MED ORDER — SUGAMMADEX SODIUM 200 MG/2ML IV SOLN
INTRAVENOUS | Status: DC | PRN
  Administered 2017-10-08: 160 mg via INTRAVENOUS

## 2017-10-08 SURGICAL SUPPLY — 68 items
BLADE CLIPPER SURG (BLADE) IMPLANT
BLADE SAW SGTL 73X25 THK (BLADE) ×2 IMPLANT
BRUSH FEMORAL CANAL (MISCELLANEOUS) ×1 IMPLANT
CAPT HIP HEMI 1 ×1 IMPLANT
CEMENT BONE DEPUY (Cement) ×2 IMPLANT
CEMENT RESTRICTOR DEPUY SZ 3 (Cement) ×2 IMPLANT
COVER SURGICAL LIGHT HANDLE (MISCELLANEOUS) ×2 IMPLANT
DRAPE HALF SHEET 40X57 (DRAPES) ×2 IMPLANT
DRAPE IMP U-DRAPE 54X76 (DRAPES) ×2 IMPLANT
DRAPE ORTHO SPLIT 77X108 STRL (DRAPES) ×4
DRAPE SURG ORHT 6 SPLT 77X108 (DRAPES) ×2 IMPLANT
DRAPE U-SHAPE 47X51 STRL (DRAPES) ×2 IMPLANT
DRILL BIT 7/64X5 (BIT) ×2 IMPLANT
DRSG ADAPTIC 3X8 NADH LF (GAUZE/BANDAGES/DRESSINGS) ×2 IMPLANT
DRSG AQUACEL AG ADV 3.5X14 (GAUZE/BANDAGES/DRESSINGS) ×1 IMPLANT
DRSG PAD ABDOMINAL 8X10 ST (GAUZE/BANDAGES/DRESSINGS) ×2 IMPLANT
DURAPREP 26ML APPLICATOR (WOUND CARE) ×2 IMPLANT
ELECT BLADE 6.5 EXT (BLADE) ×2 IMPLANT
ELECT REM PT RETURN 9FT ADLT (ELECTROSURGICAL) ×2
ELECTRODE REM PT RTRN 9FT ADLT (ELECTROSURGICAL) ×1 IMPLANT
EVACUATOR 1/8 PVC DRAIN (DRAIN) IMPLANT
GAUZE SPONGE 4X4 12PLY STRL (GAUZE/BANDAGES/DRESSINGS) ×2 IMPLANT
GLOVE BIO SURGEON STRL SZ8 (GLOVE) ×8 IMPLANT
GLOVE BIOGEL PI IND STRL 8 (GLOVE) ×2 IMPLANT
GLOVE BIOGEL PI INDICATOR 8 (GLOVE) ×2
GLOVE BIOGEL PI ORTHO PRO 7.5 (GLOVE) ×2
GLOVE PI ORTHO PRO STRL 7.5 (GLOVE) ×2 IMPLANT
GOWN STRL REUS W/ TWL LRG LVL3 (GOWN DISPOSABLE) ×1 IMPLANT
GOWN STRL REUS W/ TWL XL LVL3 (GOWN DISPOSABLE) ×3 IMPLANT
GOWN STRL REUS W/TWL LRG LVL3 (GOWN DISPOSABLE) ×2
GOWN STRL REUS W/TWL XL LVL3 (GOWN DISPOSABLE) ×6
HANDPIECE INTERPULSE COAX TIP (DISPOSABLE) ×2
IMMOBILIZER KNEE 20 (SOFTGOODS) IMPLANT
IMMOBILIZER KNEE 22 UNIV (SOFTGOODS) ×1 IMPLANT
IMMOBILIZER KNEE 24 THIGH 36 (MISCELLANEOUS) IMPLANT
IMMOBILIZER KNEE 24 UNIV (MISCELLANEOUS)
KIT BASIN OR (CUSTOM PROCEDURE TRAY) ×2 IMPLANT
KIT ROOM TURNOVER OR (KITS) ×2 IMPLANT
MANIFOLD NEPTUNE II (INSTRUMENTS) ×2 IMPLANT
NDL 1/2 CIR MAYO (NEEDLE) IMPLANT
NEEDLE 1/2 CIR MAYO (NEEDLE) ×2 IMPLANT
NEEDLE 22X1 1/2 (OR ONLY) (NEEDLE) ×1 IMPLANT
NS IRRIG 1000ML POUR BTL (IV SOLUTION) ×2 IMPLANT
PACK TOTAL JOINT (CUSTOM PROCEDURE TRAY) ×2 IMPLANT
PACK UNIVERSAL I (CUSTOM PROCEDURE TRAY) ×1 IMPLANT
PAD ARMBOARD 7.5X6 YLW CONV (MISCELLANEOUS) ×4 IMPLANT
PASSER SUT SWANSON 36MM LOOP (INSTRUMENTS) IMPLANT
PRESSURIZER FEMORAL UNIV (MISCELLANEOUS) ×1 IMPLANT
SET HNDPC FAN SPRY TIP SCT (DISPOSABLE) IMPLANT
STAPLER VISISTAT 35W (STAPLE) ×2 IMPLANT
STRIP CLOSURE SKIN 1/2X4 (GAUZE/BANDAGES/DRESSINGS) ×2 IMPLANT
SUCTION FRAZIER HANDLE 10FR (MISCELLANEOUS) ×1
SUCTION TUBE FRAZIER 10FR DISP (MISCELLANEOUS) ×1 IMPLANT
SUT ETHIBOND NAB CT1 #1 30IN (SUTURE) ×4 IMPLANT
SUT VIC AB 0 CT1 27 (SUTURE) ×4
SUT VIC AB 0 CT1 27XBRD ANBCTR (SUTURE) ×1 IMPLANT
SUT VIC AB 1 CT1 27 (SUTURE) ×2
SUT VIC AB 1 CT1 27XBRD ANTBC (SUTURE) IMPLANT
SUT VIC AB 1 CTB1 27 (SUTURE) ×4 IMPLANT
SUT VIC AB 2-0 CT1 27 (SUTURE) ×2
SUT VIC AB 2-0 CT1 TAPERPNT 27 (SUTURE) ×1 IMPLANT
SUT VIC AB 3-0 X1 27 (SUTURE) ×1 IMPLANT
SYR 30ML LL (SYRINGE) ×1 IMPLANT
TOWEL OR 17X24 6PK STRL BLUE (TOWEL DISPOSABLE) ×2 IMPLANT
TOWEL OR 17X26 10 PK STRL BLUE (TOWEL DISPOSABLE) ×2 IMPLANT
TOWER CARTRIDGE SMART MIX (DISPOSABLE) ×1 IMPLANT
TRAY FOLEY W/METER SILVER 16FR (SET/KITS/TRAYS/PACK) IMPLANT
WATER STERILE IRR 1000ML POUR (IV SOLUTION) ×8 IMPLANT

## 2017-10-08 NOTE — Transfer of Care (Signed)
Immediate Anesthesia Transfer of Care Note  Patient: [name]  Procedure(s) Performed: LEFT HIP HIMIARTHROPLASTY (Left Hip)  Patient Location: PACU  Anesthesia Type:General  Level of Consciousness: awake and patient cooperative  Airway & Oxygen Therapy: Patient Spontanous Breathing and Patient connected to nasal cannula oxygen  Post-op Assessment: Report given to RN and Post -op Vital signs reviewed and stable  Post vital signs: Reviewed and stable  Last Vitals:  Vitals:   10/08/17 0352 10/08/17 1548  BP: (!) 145/56 (!) 127/45  Pulse: 98 84  Resp:  17  Temp: 36.8 C (!) 36.2 C  SpO2: 95% 98%    Last Pain:  Vitals:   10/08/17 1015  TempSrc:   PainSc: 0-No pain         Complications: No apparent anesthesia complications

## 2017-10-08 NOTE — Progress Notes (Signed)
Dr Verne Grain informed patient had 7 beats of V-tach. VSS taken and documented in flow sheet. Orders received for 2 g of IV  magnesium sulphate and  magnesuim levels to be checked in the morning.

## 2017-10-08 NOTE — Progress Notes (Addendum)
    Still awaiting surgery. Doing well. No chest pain or dyspnea. VSS. Fluids status looks good. Lungs clear, no JVD. Ventricular pacing 70's-80's. Will check in and see how she is doing tomorrow after her surgery.   Daune Perch, AGNP-C [facility] 10/08/2017  10:33 AM Pager: [phone]   Attending note: Doing well Waiting to have surgery     Mertie Moores, MD  10/08/2017 11:07 AM    [address],  [address] [place], [place]  [phone] Pager [phone] Phone: [phone]; Fax: [phone]

## 2017-10-08 NOTE — Op Note (Signed)
PRE-OP DIAGNOSIS:  LEFT FEMORAL NECK FRACTURE POST-OP DIAGNOSIS:  LEFT FEMORAL NECK FRACTURE PROCEDURE:  Procedure(s): LEFT HIP HIMIARTHROPLASTY ANESTHESIA:  general SURGEON:  Melrose Nakayama MD ASSISTANT:  Loni Dolly PA-C   INDICATIONS FOR PROCEDURE:  The patient is a 84 y.o. female with a recent history of a painful hip and x-rays which show a displaced femoral neck fracture.  Hemiarthroplasty is offered as surgical treatment.  Informed operative consent was obtained after discussion of possible complications including reaction to anesthesia, infection, neurovascular injury, dislocation, DVT, PE, and death.  The importance of the postoperative rehab program to optimize result was stressed with the patient.  SUMMARY OF FINDINGS AND PROCEDURE:  Under general anesthesia through a standard posterior approach a hip hemiarthroplasty was performed.  The patient had no degenerative change and fair bone quality.  We used DePuy components to address the hip fracture and these were size 5 summit basic DePuyfemur capped with a -2 58mm unipolar metal hip ball.  Loni Dolly PA-C assisted throughout and was invaluable to the completion of the case in that he helped position and retract while I performed the procedure.  He also closed simultaneously to help minimize OR time.  DESCRIPTION OF PROCEDURE:  The patient was taken to the OR suite where general anesthetic was applied.  The patient was then positioned in the lateral decubitus position with the operative hip up.  All bony prominences were appropriately padded, hip positioners were utilized, and an axillary roll was placed.  Prep and drape was then performed in normal sterile fashion.  The patient was given kefzol preoperative antibiotic and an appropriate time out was performed.  We then took a posterior approach to the right hip.  Dissection was taken through adipose to the IT band and gluteus maximus fascia.  These structures were incised longitudinally to  expose the short external rotators of the hip which were tagged and reflected.  A partial posterior capsulectomy was performed and the hip was dislocated.  A femoral neck cut was made below the fracture site and the femoral head was removed.  The femur was reamed and then broached to the appropriate size.  A trial reduction was done and the aforementioned head and neck assembly gave [place] the best stability in extension with external rotation and flexion with internal rotation.  Leg lengths were felt to be about equal.  The trial components were removed and the wound irrigated.  We elected to use a size 5 femoral component.  A distal cement plug size 3 was placed followed by lavage of the femoral canal.  Cement was mixed and pressurized into the canal. We then placed the femoral component in appropriate anteversion.  Pressure was held on the component until cement hardened and excess was trimmed. The head was applied to a dry stem neck and the hip again reduced.  It was again stable in the aforementioned positions.  The would was irrigated again followed by re-approximation of short external rotators to the greater trochanteric region.  IT band and gluteus maximum fascia were repaired with #1 vicryl followed by subcutaneous closure with #O and #2 undyed vicryl.  Skin was closed with staples followed by a sterile dressing.  EBL and IOF can be obtained from anesthesia records.  DISPOSITION:  The patient was extubated in the OR and taken to PACU in stable condition to be admitted back to the medicine service  for appropriate post-op care to include perioperative antibiotics and DVT prophylaxis.

## 2017-10-08 NOTE — Anesthesia Procedure Notes (Signed)

## 2017-10-08 NOTE — Progress Notes (Signed)
PROGRESS NOTE    [name]  TDD:[id number] DOB: May 09, 1923 DOA: 10/06/2017 PCP: Gaynelle Arabian, MD   Brief Narrative:  HPI on 10/07/2017 by Dr. Ivor Costa [name] is a 84 y.o. female with medical history significant of hypertension, hyperlipidemia, GERD, pacemaker placement, MVR, left bundle blockage, dCHF, CAD, s/p of stent placement, bilateral breast cancer (s/p of bilateral mastectomy), pulmonary hypertension, GERD, CKD-3, who presents with fall and the left hip pain.  Pt states that she fell when she was walking toward TV using her walker and slipped. No LOC. She strongly denies any injury to head and the neck. She injured her left hip, and developed left hip pain, which is constant, sharp, 3 out of 10 in severity, nonradiating. No leg numbness. Patient does not have unilateral weakness, numbness or tingling to extremities. No facial droop or slurred speech. Denies chest pain, shortness of breath, nausea, vomiting, diarrhea, abdominal pain, symptoms of UTI.  Interim history Admitted for left femoral neck fracture.  Orthopedics consulted, pending surgery later today.  Cardiology also consulted for surgical "clearance".   Assessment & Plan   Left femoral neck fracture -X-ray: Acute displaced left femoral neck fracture -Secondary to fall -Orthopedic surgery consulted and appreciated- plan for surgery today -Continue pain control -Cardiology consulted for preoperative assessment- low to moderate risk  -patient is moderate risk for surgery -will likely consult PT, OT on 10/09/2017   Fall -Mechanical -Patient denied hitting her head, refused CT head and neck on admission -Patient states she does use a walker for ambulation -PT and OT will be consulted after surgery on 01/03/3761  Chronic diastolic heart failure -Echocardiogram 11/24/2016 showed EF of 83-15%, grade 1 diastolic dysfunction -Patient currently appears to be euvolemic and compensated.  She does have very trace amount of  lower extremity edema however no JVD or shortness of breath -Continue Lasix -BNP 161  Coronary artery disease -Currently denies any chest pain -Plavix held for possible surgery -Continue Coreg, Imdur, statin  Hyperlipidemia -Continue statin  Essential hypertension -Continue Coreg, hydralazine as needed  Chronic kidney disease, stage III -Creatinine appears to be stable and at baseline, continue to monitor BMP  Chronic macrocytic anemia -Hemoglobin currently 10, appears to be at baseline -Continue to monitor CBC  Hypokalemia -K 3.4, will replace and continue to monitor BMP  Asymptomatic bacteriuria -UA showed-large leukocytes, positive nitrites, rare bacteria, TNTC WBC -Patient denies any current pain or burning with urination -Currently afebrile with no leukocytosis -Will not treat with antibiotics  DVT Prophylaxis  SCDs  Code Status: DNR  Family Communication: None at bedside  Disposition Plan: Admitted, pending surgery today, 10/08/2017. Dispo pending.  Consultants Cardiology Orthopedic surgery  Procedures  None  Antibiotics   Anti-infectives (From admission, onward)   None      Subjective:   [name] seen and examined today.  Patient denies any current chest pain, shortness breath, abdominal pain, nausea or vomiting, diarrhea constipation, dizziness or headache.  States she is ready to get her surgery over with.  Denies any current pain.  Objective:   Vitals:   10/07/17 1300 10/07/17 1340 10/07/17 2106 10/08/17 0352  BP: (!) 118/53 (!) 104/54 (!) 120/55 (!) 145/56  Pulse: 73 63 75 98  Resp:  17    Temp: 97.9 F (36.6 C) 98.4 F (36.9 C) 98.7 F (37.1 C) 98.2 F (36.8 C)  TempSrc: Oral Oral Axillary Oral  SpO2: 98% 98% 95% 95%    Intake/Output Summary (Last 24 hours) at 10/08/2017 1013  Last data filed at 10/08/2017 0353 Gross per 24 hour  Intake 50 ml  Output 300 ml  Net -250 ml   There were no vitals filed for this  visit.  Exam  General: Well developed, well nourished, NAD, appears stated age  HEENT: NCAT, mucous membranes moist.   Neck: Supple  Cardiovascular: S1 S2 auscultated, RRR, no murmur  Respiratory: Clear to auscultation bilaterally with equal chest rise  Abdomen: Soft, nontender, nondistended, + bowel sounds  Extremities: warm dry without cyanosis clubbing.  LE externally rotated and shorter. No pitting edema in LE  Neuro: AAOx3, nonfocal  Psych: pleasant, appropriate mood and affect  Data Reviewed: I have personally reviewed following labs and imaging studies  CBC: Recent Labs  Lab 10/06/17 2320 10/07/17 0336 10/08/17 0524  WBC 9.4 10.7* 8.7  NEUTROABS 7.7  --   --   HGB 10.4* 10.2* 10.0*  HCT 33.6* 33.3* 32.1*  MCV 101.5* 100.9* 103.2*  PLT 191 179 784   Basic Metabolic Panel: Recent Labs  Lab 10/06/17 2320 10/07/17 0336 10/08/17 0524  NA 141 142 143  K 3.5 3.4* 3.4*  CL 101 103 103  CO2 28 28 30   GLUCOSE 117* 105* 105*  BUN 22* 21* 21*  CREATININE 1.24* 1.16* 1.16*  CALCIUM 9.0 8.8* 8.7*   GFR: CrCl cannot be calculated (Unknown ideal weight.). Liver Function Tests: Recent Labs  Lab 10/06/17 2320  AST 19  ALT 14  ALKPHOS 97  BILITOT 0.8  PROT 6.1*  ALBUMIN 3.4*   No results for input(s): LIPASE, AMYLASE in the last 168 hours. No results for input(s): AMMONIA in the last 168 hours. Coagulation Profile: Recent Labs  Lab 10/06/17 2320  INR 1.06   Cardiac Enzymes: No results for input(s): CKTOTAL, CKMB, CKMBINDEX, TROPONINI in the last 168 hours. BNP (last 3 results) No results for input(s): PROBNP in the last 8760 hours. HbA1C: No results for input(s): HGBA1C in the last 72 hours. CBG: Recent Labs  Lab 10/08/17 0606  GLUCAP 115*   Lipid Profile: No results for input(s): CHOL, HDL, LDLCALC, TRIG, CHOLHDL, LDLDIRECT in the last 72 hours. Thyroid Function Tests: No results for input(s): TSH, T4TOTAL, FREET4, T3FREE, THYROIDAB in the  last 72 hours. Anemia Panel: No results for input(s): VITAMINB12, FOLATE, FERRITIN, TIBC, IRON, RETICCTPCT in the last 72 hours. Urine analysis:    Component Value Date/Time   COLORURINE YELLOW 10/07/2017 0118   APPEARANCEUR CLOUDY (A) 10/07/2017 0118   LABSPEC 1.013 10/07/2017 0118   PHURINE 6.0 10/07/2017 0118   GLUCOSEU NEGATIVE 10/07/2017 0118   HGBUR NEGATIVE 10/07/2017 0118   BILIRUBINUR NEGATIVE 10/07/2017 0118   KETONESUR NEGATIVE 10/07/2017 0118   PROTEINUR NEGATIVE 10/07/2017 0118   UROBILINOGEN 0.2 02/23/2013 0956   NITRITE POSITIVE (A) 10/07/2017 0118   LEUKOCYTESUR LARGE (A) 10/07/2017 0118   Sepsis Labs: @LABRCNTIP (procalcitonin:4,lacticidven:4)  ) Recent Results (from the past 240 hour(s))  Surgical PCR screen     Status: None   Collection Time: 10/08/17  4:46 AM  Result Value Ref Range Status   MRSA, PCR NEGATIVE NEGATIVE Final   Staphylococcus aureus NEGATIVE NEGATIVE Final    Comment: (NOTE) The Xpert SA Assay (FDA approved for NASAL specimens in patients 59 years of age and older), is one component of a comprehensive surveillance program. It is not intended to diagnose infection nor to guide or monitor treatment. Performed at [facility], [place] [address], [place], [place] [address]       Radiology Studies: Dg  Chest 1 View  Result Date: 10/06/2017 CLINICAL DATA:  Preop, hip fracture EXAM: CHEST 1 VIEW COMPARISON:  07/11/2015 FINDINGS: Surgical clips in the right axilla. Left-sided pacing device as before. Small left-sided pleural effusion with left basilar atelectasis or pneumonia. Borderline heart size. Aortic atherosclerosis. No pneumothorax. IMPRESSION: Small left pleural effusion with left basilar atelectasis or pneumonia. Electronically Signed   By: Donavan Foil M.D.   On: 10/06/2017 23:43   Dg Hip Unilat W Or Wo Pelvis 2-3 Views Left  Result Date: 10/06/2017 CLINICAL DATA:  Fall with left hip pain EXAM: DG HIP (WITH OR WITHOUT PELVIS) 2-3V  LEFT COMPARISON:  10/17/2015 FINDINGS: Status post right hip replacement with normal alignment. Acute displaced left femoral neck fracture with cephalad migration of the trochanter. Low lying bowel loop in the right pelvis raises possibility for inguinal hernia IMPRESSION: 1. Acute displaced left femoral neck fracture 2. Status post right hip replacement with normal alignment 3. Low lying right lower pelvic bowel loops, possible inguinal hernia. Electronically Signed   By: Donavan Foil M.D.   On: 10/06/2017 22:45     Scheduled Meds: . aspirin EC  81 mg Oral Daily  . carvedilol  6.25 mg Oral BID  . furosemide  40 mg Oral Daily  . isosorbide mononitrate  90 mg Oral Daily  . multivitamin with minerals  1 tablet Oral Daily  . potassium chloride  40 mEq Oral Once  . simvastatin  20 mg Oral q1800   Continuous Infusions: . sodium chloride 10 mL/hr at 10/06/17 2328     LOS: 1 day   Time Spent in minutes   30 minutes  Mahnoor Mathisen D.O. on 10/08/2017 at 10:13 AM  Between 7am to 7pm - Pager - [phone]  After 7pm go to [email or web address] - password [email or web address]  And look for the night coverage person covering for me after hours  [facility] Office  [phone]

## 2017-10-08 NOTE — Anesthesia Postprocedure Evaluation (Signed)
Anesthesia Post Note  Patient: [name]  Procedure(s) Performed: LEFT HIP HIMIARTHROPLASTY (Left Hip)     Patient location during evaluation: PACU Anesthesia Type: General Level of consciousness: awake Pain management: pain level controlled Vital Signs Assessment: post-procedure vital signs reviewed and stable Respiratory status: spontaneous breathing Cardiovascular status: stable Anesthetic complications: no    Last Vitals:  Vitals:   10/08/17 1628 10/08/17 1645  BP: (!) 100/52 (!) 106/50  Pulse: 77 80  Resp: 19 18  Temp:    SpO2: 97% 93%    Last Pain:  Vitals:   10/08/17 1617  TempSrc:   PainSc: 0-No pain                 Diyana Starrett

## 2017-10-08 NOTE — Social Work (Signed)
CSW spoke with [name], of business development [phone]) at [facility] that patient resides. She will be the contact for facility. She indicated that that facility can provide PT/OT and will assist once patient is evaluated and recommendations made by therapy.  CSW will continue to follow and discuss with patient/family once PT/OT has evaluated patient.  Elissa Hefty, LCSW Clinical Social Worker [phone]

## 2017-10-08 NOTE — Interval H&P Note (Signed)
History and Physical Interval Note:  10/08/2017 1:14 PM  [name]  has presented today for surgery, with the diagnosis of LEFT FEMORAL NECK FRACTURE  The various methods of treatment have been discussed with the patient and family. After consideration of risks, benefits and other options for treatment, the patient has consented to  Procedure(s): LEFT HIP HIMIARTHROPLASTY (Left) as a surgical intervention .  The patient's history has been reviewed, patient examined, no change in status, stable for surgery.  I have reviewed the patient's chart and labs.  Questions were answered to the patient's satisfaction.     [name] G

## 2017-10-08 NOTE — Anesthesia Preprocedure Evaluation (Signed)
Anesthesia Evaluation  Patient identified by MRN, date of birth, ID band Patient awake    Reviewed: Allergy & Precautions, H&P , Patient's Chart, lab work & pertinent test results, reviewed documented beta blocker date and time   Airway Mallampati: II  TM Distance: >3 FB Neck ROM: full    Dental no notable dental hx.    Pulmonary former smoker,    Pulmonary exam normal breath sounds clear to auscultation       Cardiovascular hypertension,  Rhythm:regular Rate:Normal     Neuro/Psych    GI/Hepatic   Endo/Other    Renal/GU      Musculoskeletal   Abdominal   Peds  Hematology   Anesthesia Other Findings - Normal LV size with mild LV hypertrophy. EF 55-60%. Normal RV   size and systolic function. Mild mitral regurgitation. Mild   pulmonary hypertension. Pacer  Reproductive/Obstetrics                             Anesthesia Physical Anesthesia Plan  ASA: III  Anesthesia Plan: General   Post-op Pain Management:    Induction: Intravenous  PONV Risk Score and Plan:   Airway Management Planned: Oral ETT  Additional Equipment:   Intra-op Plan:   Post-operative Plan: Extubation in OR  Informed Consent: I have reviewed the patients History and Physical, chart, labs and discussed the procedure including the risks, benefits and alternatives for the proposed anesthesia with the patient or authorized representative who has indicated his/her understanding and acceptance.   Dental Advisory Given  Plan Discussed with: CRNA and Surgeon  Anesthesia Plan Comments: (  )        Anesthesia Quick Evaluation

## 2017-10-09 ENCOUNTER — Encounter (HOSPITAL_COMMUNITY): Payer: Self-pay | Admitting: Orthopaedic Surgery

## 2017-10-09 DIAGNOSIS — Z96642 Presence of left artificial hip joint: Secondary | ICD-10-CM

## 2017-10-09 LAB — BASIC METABOLIC PANEL
Anion gap: 9 (ref 5–15)
BUN: 25 mg/dL — ABNORMAL HIGH (ref 6–20)
CO2: 29 mmol/L (ref 22–32)
Calcium: 8.2 mg/dL — ABNORMAL LOW (ref 8.9–10.3)
Chloride: 104 mmol/L (ref 101–111)
Creatinine, Ser: 1.3 mg/dL — ABNORMAL HIGH (ref 0.44–1.00)
GFR calc Af Amer: 40 mL/min — ABNORMAL LOW (ref >60)
GFR calc non Af Amer: 35 mL/min — ABNORMAL LOW (ref >60)
Glucose, Bld: 103 mg/dL — ABNORMAL HIGH (ref 65–99)
Potassium: 4.2 mmol/L (ref 3.5–5.1)
Sodium: 142 mmol/L (ref 135–145)

## 2017-10-09 LAB — CBC
HCT: 25.3 % — ABNORMAL LOW (ref 36.0–46.0)
Hemoglobin: 7.9 g/dL — ABNORMAL LOW (ref 12.0–15.0)
MCH: 32.1 pg (ref 26.0–34.0)
MCHC: 31.2 g/dL (ref 30.0–36.0)
MCV: 102.8 fL — ABNORMAL HIGH (ref 78.0–100.0)
Platelets: 141 10*3/uL — ABNORMAL LOW (ref 150–400)
RBC: 2.46 MIL/uL — ABNORMAL LOW (ref 3.87–5.11)
RDW: 15 % (ref 11.5–15.5)
WBC: 10.5 10*3/uL (ref 4.0–10.5)

## 2017-10-09 LAB — HEMOGLOBIN AND HEMATOCRIT, BLOOD
HCT: 26.1 % — ABNORMAL LOW (ref 36.0–46.0)
Hemoglobin: 8.1 g/dL — ABNORMAL LOW (ref 12.0–15.0)

## 2017-10-09 LAB — MAGNESIUM: Magnesium: 2.6 mg/dL — ABNORMAL HIGH (ref 1.7–2.4)

## 2017-10-09 MED ORDER — HYDROCODONE-ACETAMINOPHEN 5-325 MG PO TABS: 1.0000 | ORAL_TABLET | Freq: Four times a day (QID) | ORAL | 0 refills | Status: DC | PRN

## 2017-10-09 MED ORDER — ASPIRIN 325 MG PO TBEC
325.0000 mg | DELAYED_RELEASE_TABLET | Freq: Every day | ORAL | 0 refills | Status: DC
Start: 2017-10-09 — End: 2017-11-24

## 2017-10-09 NOTE — Social Work (Addendum)
CSW met with daughter and patient at bedside to discuss disposition.  Pt from [facility] independent living and was independent with ADL's, but would have staff sepervise her when she took showers to ensure safety. Pt uses a walker to ambulate. Patient and daughter agree to go to SNF at discharge and is interested in [facility] and/or [facility].  Once patient completes PT evaluation, then CSW will f/u for SNF placement.  CSW discussed status of patient with [facility] hospital liaison, Rollene Fare, who will be the contact for facility.  CSW will f/u for disposition.  Elissa Hefty, LCSW Clinical Social Worker [phone]

## 2017-10-09 NOTE — NC FL2 (Signed)
[place] LEVEL OF CARE SCREENING TOOL     IDENTIFICATION  Patient Name: [name] Birthdate: September 18, 1922 Sex: female Admission Date (Current Location): 10/06/2017  [facility] [place] and [place] Number:  Herbalist and Address:  [facility] [place], [place] [address], [place], [place] [address]      Provider Number: [id number]  Attending Physician Name and Address:  Cristal Ford, DO  Relative Name and Phone Number:  Donasia Wimes, daughter [phone]    Current Level of Care: Hospital Recommended Level of Care: [place] Prior Approval Number:    Date Approved/Denied:   PASRR Number: [id number]  Discharge Plan: SNF    Current Diagnoses: Patient Active Problem List   Diagnosis Date Noted  . HLD (hyperlipidemia) 10/07/2017  . Fall 10/07/2017  . Closed displaced fracture of left femoral neck ([place]) 10/07/2017  . CKD (chronic kidney disease), stage III ([place]) 10/07/2017  . Anemia 07/23/2016  . Pulmonary HTN ([place]) 11/22/2015  . SOB (shortness of breath) 11/22/2015  . Dyslipidemia 08/10/2013  . Benign essential HTN 08/10/2013  . Cardiomyopathy ([place])   . CAD (coronary artery disease)   . Urinary retention 02/23/2013  . Pacemaker-Medtronic-CRT 05/28/2012  . Left bundle branch block 05/11/2012  . Chronic diastolic CHF (congestive heart failure) ([place]) 05/11/2012  . Edema-chronic lower extremity X decades 05/11/2012  . DEGENERATIVE JOINT DISEASE 04/18/2010  . HIP PAIN, RIGHT 04/18/2010  . KNEE PAIN, RIGHT, CHRONIC 02/28/2009  . BACK PAIN, CHRONIC 02/28/2009  . OTHER ACQUIRED DEFORMITY OF ANKLE AND FOOT OTHER 02/28/2009    Orientation RESPIRATION BLADDER Height & Weight     Self, Time, Situation, Place  O2(Nasal Cannula 2L) Continent Weight: 175 lb (79.4 kg) Height:  5\' 4"  (162.6 cm)  BEHAVIORAL SYMPTOMS/MOOD NEUROLOGICAL BOWEL NUTRITION STATUS      Continent Diet(See DC Summary)  AMBULATORY STATUS COMMUNICATION OF NEEDS  Skin   Extensive Assist Verbally Surgical wounds                       Personal Care Assistance Level of Assistance  Bathing, Feeding, Dressing Bathing Assistance: Maximum assistance Feeding assistance: Limited assistance Dressing Assistance: Maximum assistance     Functional Limitations Info  Sight, Hearing, Speech Sight Info: Adequate Hearing Info: Impaired Speech Info: Adequate    SPECIAL CARE FACTORS FREQUENCY  PT (By licensed PT), OT (By licensed OT)     PT Frequency: 5x week OT Frequency: 5x week            Contractures      Additional Factors Info  Code Status, Allergies Code Status Info: DNR Allergies Info: ALTACE RAMIPRIL, CELEBREX CELECOXIB            Current Medications (10/09/2017):  This is the current hospital active medication list Current Facility-Administered Medications  Medication Dose Route Frequency Provider Last Rate Last Dose  . 0.9 %  sodium chloride infusion   Intravenous Continuous Charlann Lange, PA-C 10 mL/hr at 10/06/17 2328    . acetaminophen (TYLENOL) tablet 325-650 mg  325-650 mg Oral Q6H PRN Loni Dolly, PA-C      . acetaminophen (TYLENOL) tablet 500 mg  500 mg Oral Q6H Loni Dolly, PA-C   500 mg at 10/09/17 1305  . albuterol (PROVENTIL) (2.5 MG/3ML) 0.083% nebulizer solution 3 mL  3 mL Inhalation Q6H PRN Ivor Costa, MD      . aspirin EC tablet 325 mg  325 mg Oral Q breakfast Loni Dolly, PA-C  325 mg at 10/09/17 1011  . bupivacaine liposome (EXPAREL) 1.3 % injection 266 mg  20 mL Infiltration Once Cristal Ford, DO      . carvedilol (COREG) tablet 6.25 mg  6.25 mg Oral BID Ivor Costa, MD   6.25 mg at 10/09/17 1011  . clopidogrel (PLAVIX) tablet 75 mg  75 mg Oral Daily Loni Dolly, PA-C   75 mg at 10/09/17 1011  . docusate sodium (COLACE) capsule 100 mg  100 mg Oral BID Loni Dolly, PA-C   100 mg at 10/09/17 1011  . furosemide (LASIX) tablet 40 mg  40 mg Oral Daily Ivor Costa, MD   40 mg at 10/09/17 1012  . hydrALAZINE  (APRESOLINE) injection 5 mg  5 mg Intravenous Q2H PRN Ivor Costa, MD      . HYDROcodone-acetaminophen (NORCO) 7.5-325 MG per tablet 1-2 tablet  1-2 tablet Oral Q4H PRN Loni Dolly, PA-C      . HYDROcodone-acetaminophen (NORCO/VICODIN) 5-325 MG per tablet 1-2 tablet  1-2 tablet Oral Q4H PRN Loni Dolly, PA-C   2 tablet at 10/09/17 0546  . hydrOXYzine (ATARAX/VISTARIL) tablet 10 mg  10 mg Oral TID PRN Ivor Costa, MD      . isosorbide mononitrate (IMDUR) 24 hr tablet 90 mg  90 mg Oral Daily Ivor Costa, MD   90 mg at 10/09/17 1011  . lactated ringers infusion   Intravenous Continuous Belinda Block, MD      . menthol-cetylpyridinium (CEPACOL) lozenge 3 mg  1 lozenge Oral PRN Loni Dolly, PA-C       Or  . phenol (CHLORASEPTIC) mouth spray 1 spray  1 spray Mouth/Throat PRN Loni Dolly, PA-C      . methocarbamol (ROBAXIN) tablet 500 mg  500 mg Oral Q8H PRN Ivor Costa, MD   500 mg at 10/08/17 1019  . metoCLOPramide (REGLAN) tablet 5-10 mg  5-10 mg Oral Q8H PRN Loni Dolly, PA-C       Or  . metoCLOPramide (REGLAN) injection 5-10 mg  5-10 mg Intravenous Q8H PRN Loni Dolly, PA-C      . morphine 2 MG/ML injection 0.5-1 mg  0.5-1 mg Intravenous Q2H PRN Loni Dolly, PA-C      . morphine 4 MG/ML injection 1 mg  1 mg Intravenous Q4H PRN Ivor Costa, MD   1 mg at 10/08/17 0409  . multivitamin with minerals tablet 1 tablet  1 tablet Oral Daily Cristal Ford, DO   1 tablet at 10/09/17 1011  . nitroGLYCERIN (NITROSTAT) SL tablet 0.4 mg  0.4 mg Sublingual Q5 min PRN Ivor Costa, MD      . ondansetron [facility]) tablet 4 mg  4 mg Oral Q6H PRN Loni Dolly, PA-C       Or  . ondansetron (ZOFRAN) injection 4 mg  4 mg Intravenous Q6H PRN Loni Dolly, PA-C      . polyethylene glycol (MIRALAX / GLYCOLAX) packet 17 g  17 g Oral Daily PRN Ivor Costa, MD      . simvastatin (ZOCOR) tablet 20 mg  20 mg Oral q1800 Ivor Costa, MD   20 mg at 10/08/17 2023  . zolpidem (AMBIEN) tablet 5 mg  5 mg Oral QHS PRN Ivor Costa, MD          Discharge Medications: Please see discharge summary for a list of discharge medications.  Relevant Imaging Results:  Relevant Lab Results:   Additional Information SS#: [place], LCSW

## 2017-10-09 NOTE — Progress Notes (Addendum)
Progress Note  Patient Name: [name] Date of Encounter: 10/09/2017  Primary Cardiologist: Fransico Him, MD   Subjective   Doing ok post op. No chest pain or dyspnea.   Inpatient Medications    Scheduled Meds: . acetaminophen  500 mg Oral Q6H  . aspirin EC  325 mg Oral Q breakfast  . bupivacaine liposome  20 mL Infiltration Once  . carvedilol  6.25 mg Oral BID  . clopidogrel  75 mg Oral Daily  . docusate sodium  100 mg Oral BID  . furosemide  40 mg Oral Daily  . isosorbide mononitrate  90 mg Oral Daily  . multivitamin with minerals  1 tablet Oral Daily  . simvastatin  20 mg Oral q1800   Continuous Infusions: . sodium chloride 10 mL/hr at 10/06/17 2328  . lactated ringers     PRN Meds: acetaminophen, albuterol, hydrALAZINE, HYDROcodone-acetaminophen, HYDROcodone-acetaminophen, hydrOXYzine, menthol-cetylpyridinium **OR** phenol, methocarbamol, metoCLOPramide **OR** metoCLOPramide (REGLAN) injection, morphine injection, morphine injection, nitroGLYCERIN, ondansetron **OR** ondansetron (ZOFRAN) IV, polyethylene glycol, zolpidem   Vital Signs    Vitals:   10/08/17 1956 10/08/17 2148 10/08/17 2353 10/09/17 0409  BP: (!) 105/52 (!) 101/46 (!) 104/46 (!) 116/40  Pulse: 81 79 76 83  Resp: 18 18 18 18   Temp: 98 F (36.7 C)  98.2 F (36.8 C) 98 F (36.7 C)  TempSrc: Oral  Oral Oral  SpO2: 96% 96% 96% 96%  Weight:      Height:        Intake/Output Summary (Last 24 hours) at 10/09/2017 1245 Last data filed at 10/09/2017 0900 Gross per 24 hour  Intake 1610 ml  Output 500 ml  Net 1110 ml   Filed Weights   10/08/17 1207  Weight: 175 lb (79.4 kg)    Telemetry    Paced rhythm  - Personally Reviewed  ECG    SR PACs - Personally Reviewed  Physical Exam   GEN: No acute distress. Elderly, obese  Neck: No JVD Cardiac: RRR, no murmurs, rubs, or gallops.  Respiratory: Clear to auscultation bilaterally. GI: Soft, nontender, non-distended  MS: No edema; Neuro:   Nonfocal  Psych: Normal affect   Labs    Chemistry Recent Labs  Lab 10/06/17 2320 10/07/17 0336 10/08/17 0524 10/09/17 0516  NA 141 142 143 142  K 3.5 3.4* 3.4* 4.2  CL 101 103 103 104  CO2 28 28 30 29   GLUCOSE 117* 105* 105* 103*  BUN 22* 21* 21* 25*  CREATININE 1.24* 1.16* 1.16* 1.30*  CALCIUM 9.0 8.8* 8.7* 8.2*  PROT 6.1*  --   --   --   ALBUMIN 3.4*  --   --   --   AST 19  --   --   --   ALT 14  --   --   --   ALKPHOS 97  --   --   --   BILITOT 0.8  --   --   --   GFRNONAA 37* 40* 40* 35*  GFRAA 42* 46* 46* 40*  ANIONGAP 12 11 10 9      Hematology Recent Labs  Lab 10/07/17 0336 10/08/17 0524 10/09/17 0516  WBC 10.7* 8.7 10.5  RBC 3.30* 3.11* 2.46*  HGB 10.2* 10.0* 7.9*  HCT 33.3* 32.1* 25.3*  MCV 100.9* 103.2* 102.8*  MCH 30.9 32.2 32.1  MCHC 30.6 31.2 31.2  RDW 14.3 15.2 15.0  PLT 179 156 141*    Cardiac EnzymesNo results for input(s): TROPONINI in the last  168 hours. No results for input(s): TROPIPOC in the last 168 hours.   BNP Recent Labs  Lab 10/06/17 2320  BNP 161.0*     DDimer No results for input(s): DDIMER in the last 168 hours.   Radiology    Pelvis Portable  Result Date: 10/08/2017 CLINICAL DATA:  Postop left hip hemiarthroplasty. EXAM: PORTABLE PELVIS 1-2 VIEWS COMPARISON:  10/17/2015 FINDINGS: Subcutaneous emphysema is seen about the left hip status post left bipolar uncemented hip arthroplasty. No immediate postoperative complication is noted. No joint dislocation or fracture is apparent for the single frontal view. Pre-existing right total hip arthroplasty is identified without evidence of loosening or hardware failure. Included pelvis appears intact. IMPRESSION: New left bipolar hip arthroplasty with postop soft tissue change. No immediate complications noted. Electronically Signed   By: Ashley Royalty M.D.   On: 10/08/2017 18:38    Cardiac Studies   2D Echo 11/24/16 Study Conclusions  - Left ventricle: The cavity size was normal.  Wall thickness was   increased in a pattern of mild LVH. Systolic function was normal.   The estimated ejection fraction was in the range of 55% to 60%.   Wall motion was normal; there were no regional wall motion   abnormalities. Doppler parameters are consistent with abnormal   left ventricular relaxation (grade 1 diastolic dysfunction). - Aortic valve: There was no stenosis. - Mitral valve: There was mild regurgitation. - Left atrium: The atrium was mildly dilated. - Right ventricle: The cavity size was normal. Pacer wire or   catheter noted in right ventricle. Systolic function was normal. - Tricuspid valve: Peak RV-RA gradient (S): 38 mm Hg. - Pulmonary arteries: PA peak pressure: 41 mm Hg (S). - Inferior vena cava: The vessel was normal in size. The   respirophasic diameter changes were in the normal range (= 50%),   consistent with normal central venous pressure.  Impressions:  - Normal LV size with mild LV hypertrophy. EF 55-60%. Normal RV   size and systolic function. Mild mitral regurgitation. Mild   pulmonary hypertension.  ------------------------------------------------------------------- Labs, prior tests, procedures, and surgery: Permanent pacemaker system implantation.  Patient Profile     [name] is a 84 y.o. female with a hx of hypertension, hyperlipidemia, GERD, MVR, LBBB, chronic chronic diastolic CHF/ischemic DCM(EF 45% 08/2015), s/p [employer] BiV pacemaker, CAD S/P stent, chronic stable angina bilateral breast cancer as/P bilateral mastectomy pulmonary hypertension, CKD stage III, admitted for hip fx after mechanical fall. Cardiology evaluated pre-op. Pt cleared. Surgery performed 10/08/17.   Assessment & Plan    1. Femoral Neck Fracture: day 1 s/p left hip hemiarthroplasty. Post op management per ortho.   2. H/o Systolic HF: EF has been down to 45%, however most recent echocardiogram in 11/2016 showed a normalized EF 55-60%. Monitor volume status and be  cautious with IVF administration postoperatively. Keep tract of I/Os.   3. CAD: Status post DES to LAD with residual disease of the diagonal that was too small for PCI by cath 01/2010 and then repeat cath with restenosis of the LAD status post PCI 10/2010. Pt did well during ortho surgery. She denies CP and dyspnea. Continue medical therapy.  4. ABLA: drop in Hgb from 10>.7.9 post op. Monitor closely. Transfuse if < 7.0.    For questions or updates, please contact [place] Please consult [email or web address] for contact info under Cardiology/STEMI.      Signed, Lyda Jester, PA-C  10/09/2017, 12:45 PM    Attending Note:   The  patient was seen and examined.  Agree with assessment and plan as noted above.  Changes made to the above note as needed.  Patient seen and independently examined with Ellen Henri, PA .   We discussed all aspects of the encounter. I agree with the assessment and plan as stated above.  1.   Hip surgery . Did well.  Having a little oozing from the wound Will hold plavix Can restart it in about a week   She will follow up with Dr. Radford Pax,  Will sign off. Call for questions    I have spent a total of 30 minutes with patient reviewing hospital  notes , telemetry, EKGs, labs and examining patient as well as establishing an assessment and plan that was discussed with the patient. > 50% of time was spent in direct patient care.    Thayer Headings, Brooke Bonito., MD, [facility] 10/09/2017, 3:01 PM [address]  [place] Pager [phone]

## 2017-10-09 NOTE — Progress Notes (Signed)
Subjective: 1 Day Post-Op Procedure(s) (LRB): LEFT HIP HIMIARTHROPLASTY (Left)   Patient is resting comfortably in bed this morning. She has no pain when she is at rest.  Activity level:  wbat with posterior hip precautions Diet tolerance:  ok Voiding:  ok Patient reports pain as mild.    Objective: Vital signs in last 24 hours: Temp:  [97.2 F (36.2 C)-98.2 F (36.8 C)] 98 F (36.7 C) (03/08 0409) Pulse Rate:  [76-84] 83 (03/08 0409) Resp:  [14-19] 18 (03/08 0409) BP: (96-127)/(40-55) 116/40 (03/08 0409) SpO2:  [93 %-100 %] 96 % (03/08 0409) Weight:  [79.4 kg (175 lb)] 79.4 kg (175 lb) (03/07 1207)  Labs: Recent Labs    10/06/17 2320 10/07/17 0336 10/08/17 0524 10/09/17 0516  HGB 10.4* 10.2* 10.0* 7.9*   Recent Labs    10/08/17 0524 10/09/17 0516  WBC 8.7 10.5  RBC 3.11* 2.46*  HCT 32.1* 25.3*  PLT 156 141*   Recent Labs    10/08/17 0524 10/09/17 0516  NA 143 142  K 3.4* 4.2  CL 103 104  CO2 30 29  BUN 21* 25*  CREATININE 1.16* 1.30*  GLUCOSE 105* 103*  CALCIUM 8.7* 8.2*   Recent Labs    10/06/17 2320  INR 1.06    Physical Exam:  Neurologically intact ABD soft Neurovascular intact Sensation intact distally Intact pulses distally Dorsiflexion/Plantar flexion intact Incision: dressing C/D/I and scant drainage No cellulitis present Compartment soft  Assessment/Plan:  1 Day Post-Op Procedure(s) (LRB): LEFT HIP HIMIARTHROPLASTY (Left) Advance diet Up with therapy Discharge to SNF once stable and cleared by both medicine and PT. Her Hgb is 7.9 this morning. I will leave transfusion decision up to the medicine team and have notified the nurse of this. Continue on home plavix and 325mg  ASA BID for DVT prevention. Follow up in office 2 weeks post op. Continue with posterior hip precautions and knee immobilizer while in bed. We greatly appreciate medical management.  Jaaliyah Lucatero, Larwance Sachs 10/09/2017, 8:00 AM

## 2017-10-09 NOTE — Progress Notes (Addendum)
Physical Therapy Treatment Patient Details Name: [name] MRN: [id number] DOB: 1923-01-31 Today's Date: 10/09/2017    History of Present Illness [name] is a 84 y.o. female with medical history significant of hypertension, hyperlipidemia, GERD, pacemaker placement, MVR, left bundle blockage, dCHF, CAD, s/p of stent placement, bilateral breast cancer (s/p of bilateral mastectomy), pulmonary hypertension, GERD, CKD-3, who presents with fall and the left hip pain. Now s/p L hip hemiarthoplasty with posterior hip precautions. 3/07    PT Comments    PT requested to assist nursing with patient back into bed from chair. Patient is max A x2 for all mobility at this time and unable to stand due to pain and weakness. Will cont to progress as tolerated.   Follow Up Recommendations  SNF;Supervision/Assistance - 24 hour     Equipment Recommendations  None recommended by PT    Recommendations for Other Services       Precautions / Restrictions Precautions Precautions: Posterior Hip Precaution Booklet Issued: Yes (comment) Precaution Comments: revewied hip precautions with patient and daughter.  Restrictions Weight Bearing Restrictions: Yes LLE Weight Bearing: Weight bearing as tolerated    Mobility  Bed Mobility Overal bed mobility: Needs Assistance Bed Mobility: Supine to Sit     Supine to sit: Max assist     General bed mobility comments: max A to assist with surgical limb over EOB, and bringing trunk up to sitting.   Transfers Overall transfer level: Needs assistance Equipment used: 2 person hand held assist Transfers: Squat Pivot Transfers     Squat pivot transfers: +2 physical assistance;Max assist    Lateral/Scoot Transfers: Max assist General transfer comment: Max A x2 to use pad and gait belt to assist patient into bed.  Ambulation/Gait             General Gait Details: unable   Stairs            Wheelchair Mobility    Modified Rankin  (Stroke Patients Only)       Balance Overall balance assessment: History of Falls;Needs assistance Sitting-balance support: Feet unsupported;No upper extremity supported Sitting balance-Leahy Scale: Fair       Standing balance-Leahy Scale: Zero Standing balance comment: total A for standing at this time                            Cognition Arousal/Alertness: Awake/alert Behavior During Therapy: WFL for tasks assessed/performed Overall Cognitive Status: Within Functional Limits for tasks assessed                                        Exercises      General Comments General comments (skin integrity, edema, etc.): cont bleeding in L hip, RN aware and notifiying MD.       Pertinent Vitals/Pain Pain Assessment: Faces Faces Pain Scale: Hurts even more Pain Location: L hip Pain Descriptors / Indicators: Discomfort;Guarding;Operative site guarding Pain Intervention(s): Limited activity within patient's tolerance;Monitored during session;Premedicated before session    Home Living Family/patient expects to be discharged to:: Skilled nursing facility                    Prior Function Level of Independence: Needs assistance  Gait / Transfers Assistance Needed: walking at SNF with RW       PT Goals (current goals can now be found in  the care plan section) Acute Rehab PT Goals Patient Stated Goal: go to rehab PT Goal Formulation: With patient Time For Goal Achievement: 10/16/17 Potential to Achieve Goals: Good    Frequency    Min 3X/week      PT Plan Current plan remains appropriate    Co-evaluation              AM-PAC PT "6 Clicks" Daily Activity  Outcome Measure  Difficulty turning over in bed (including adjusting bedclothes, sheets and blankets)?: Unable Difficulty moving from lying on back to sitting on the side of the bed? : Unable Difficulty sitting down on and standing up from a chair with arms (e.g., wheelchair,  bedside commode, etc,.)?: Unable Help needed moving to and from a bed to chair (including a wheelchair)?: Total Help needed walking in hospital room?: Total Help needed climbing 3-5 steps with a railing? : Total 6 Click Score: 6    End of Session Equipment Utilized During Treatment: Gait belt Activity Tolerance: Patient limited by fatigue;Patient limited by pain Patient left: in chair;with call bell/phone within reach;with family/visitor present Nurse Communication: Mobility status;Need for lift equipment PT Visit Diagnosis: Unsteadiness on feet (R26.81);Other abnormalities of gait and mobility (R26.89);Muscle weakness (generalized) (M62.81);History of falling (Z91.81);Pain;Difficulty in walking, not elsewhere classified (R26.2) Pain - Right/Left: Left Pain - part of body: Hip     Time: 1430-1445 PT Time Calculation (min) (ACUTE ONLY): 15 min  Charges:  $Therapeutic Activity: 8-22 mins                     G Codes:       Reinaldo Berber, PT, DPT Acute Rehab Services Pager: [phone]     Reinaldo Berber 10/09/2017, 3:55 PM

## 2017-10-09 NOTE — Progress Notes (Addendum)
PROGRESS NOTE    [name]  KXF:[id number] DOB: 1922/09/30 DOA: 10/06/2017 PCP: Gaynelle Arabian, MD   Brief Narrative:  HPI on 10/07/2017 by Dr. Ivor Costa [name] is a 84 y.o. female with medical history significant of hypertension, hyperlipidemia, GERD, pacemaker placement, MVR, left bundle blockage, dCHF, CAD, s/p of stent placement, bilateral breast cancer (s/p of bilateral mastectomy), pulmonary hypertension, GERD, CKD-3, who presents with fall and the left hip pain.  Pt states that she fell when she was walking toward TV using her walker and slipped. No LOC. She strongly denies any injury to head and the neck. She injured her left hip, and developed left hip pain, which is constant, sharp, 3 out of 10 in severity, nonradiating. No leg numbness. Patient does not have unilateral weakness, numbness or tingling to extremities. No facial droop or slurred speech. Denies chest pain, shortness of breath, nausea, vomiting, diarrhea, abdominal pain, symptoms of UTI.  Interim history Admitted for left femoral neck fracture.  Orthopedics consulted, s/p surgery. Now with anemia, will continue to monitor.  Assessment & Plan   Left femoral neck fracture -X-ray: Acute displaced left femoral neck fracture -Secondary to fall -Cardiology consulted for preoperative assessment- low to moderate risk -Orthopedic surgery consulted and appreciated- s/p left hip himiarthroplasty -Continue pain control -PT/OT consulted and pending    Acute blood loss anemia/Acute on chronic macrocytic anemia -Possibly secondary to surgery versus delusional component -Baseline hemoglobin approximately 10 -Hemoglobin this morning dropped to 7.9 -Patient is currently asymptomatic -Was restarted on her Plavix/aspirin, will continue to monitor very closely-repeat H&H later today -If hemoglobin continues to drop, will transfuse -Continue to monitor CBC  Fall -Mechanical -Patient denied hitting her head, refused CT  head and neck on admission -Patient states she does use a walker for ambulation -PT and OT consulted  Nonsustained V. Tach -Overnight, patient had a 7 beat run of NSVT -Patient was given 2 g of IV magnesium -Potassium level this morning 4.2, magnesium 2.6 -Continue to monitor closely  Chronic diastolic heart failure -Echocardiogram 11/24/2016 showed EF of 69-67%, grade 1 diastolic dysfunction -Patient currently appears to be euvolemic and compensated.  She does have very trace amount of lower extremity edema however no JVD or shortness of breath -Continue Lasix -BNP 161  Coronary artery disease -Currently denies any chest pain -Plavix held for possible surgery- but was restarted today -Continue Coreg, Imdur, statin  Hyperlipidemia -Continue statin  Essential hypertension -Continue Coreg, hydralazine as needed  Chronic kidney disease, stage III -Creatinine appears to be stable and at baseline, continue to monitor BMP  Hypokalemia -Resolved, continue to monitor BMP  Asymptomatic bacteriuria -UA showed-large leukocytes, positive nitrites, rare bacteria, TNTC WBC -Patient denies any current pain or burning with urination -Currently afebrile with no leukocytosis -Will not treat with antibiotics  DVT Prophylaxis  SCDs  Code Status: DNR  Family Communication: None at bedside  Disposition Plan: Admitted, pending PT and OT evaluations.  Also monitoring hemoglobin.  Dispo TBD  Consultants Cardiology Orthopedic surgery  Procedures  left hip himiarthroplasty  Antibiotics   Anti-infectives (From admission, onward)   Start     Dose/Rate Route Frequency Ordered Stop   10/08/17 2000  ceFAZolin (ANCEF) IVPB 2g/100 mL premix     2 g 200 mL/hr over 30 Minutes Intravenous Every 6 hours 10/08/17 1712 10/08/17 2219      Subjective:   [name] seen and examined today.  Patient currently denies any chest pain, shortness of breath, abdominal pain, nausea  or vomiting,  diarrhea or constipation.  Happy that her surgery is over with.  Objective:   Vitals:   10/08/17 1956 10/08/17 2148 10/08/17 2353 10/09/17 0409  BP: (!) 105/52 (!) 101/46 (!) 104/46 (!) 116/40  Pulse: 81 79 76 83  Resp: 18 18 18 18   Temp: 98 F (36.7 C)  98.2 F (36.8 C) 98 F (36.7 C)  TempSrc: Oral  Oral Oral  SpO2: 96% 96% 96% 96%  Weight:      Height:        Intake/Output Summary (Last 24 hours) at 10/09/2017 1233 Last data filed at 10/09/2017 0900 Gross per 24 hour  Intake 1610 ml  Output 500 ml  Net 1110 ml   Filed Weights   10/08/17 1207  Weight: 79.4 kg (175 lb)   Exam  General: Well developed, well nourished, NAD, appears stated age  HEENT: NCAT, mucous membranes moist.   Neck: Supple  Cardiovascular: S1 S2 auscultated, RRR, no murmur  Respiratory: Clear to auscultation bilaterally with equal chest rise  Abdomen: Soft, nontender, nondistended, + bowel sounds  Extremities: warm dry without cyanosis clubbing. No pitting edema in LE. Dressing in place on left hip.  Neuro: AAOx3, nonfocal (hard of hearing)  Psych: Appropriate mood and affect, pleasant   Data Reviewed: I have personally reviewed following labs and imaging studies  CBC: Recent Labs  Lab 10/06/17 2320 10/07/17 0336 10/08/17 0524 10/09/17 0516  WBC 9.4 10.7* 8.7 10.5  NEUTROABS 7.7  --   --   --   HGB 10.4* 10.2* 10.0* 7.9*  HCT 33.6* 33.3* 32.1* 25.3*  MCV 101.5* 100.9* 103.2* 102.8*  PLT 191 179 156 295*   Basic Metabolic Panel: Recent Labs  Lab 10/06/17 2320 10/07/17 0336 10/08/17 0524 10/09/17 0516  NA 141 142 143 142  K 3.5 3.4* 3.4* 4.2  CL 101 103 103 104  CO2 28 28 30 29   GLUCOSE 117* 105* 105* 103*  BUN 22* 21* 21* 25*  CREATININE 1.24* 1.16* 1.16* 1.30*  CALCIUM 9.0 8.8* 8.7* 8.2*  MG  --   --   --  2.6*   GFR: Estimated Creatinine Clearance: 28.2 mL/min (A) (by C-G formula based on SCr of 1.3 mg/dL (H)). Liver Function Tests: Recent Labs  Lab  10/06/17 2320  AST 19  ALT 14  ALKPHOS 97  BILITOT 0.8  PROT 6.1*  ALBUMIN 3.4*   No results for input(s): LIPASE, AMYLASE in the last 168 hours. No results for input(s): AMMONIA in the last 168 hours. Coagulation Profile: Recent Labs  Lab 10/06/17 2320  INR 1.06   Cardiac Enzymes: No results for input(s): CKTOTAL, CKMB, CKMBINDEX, TROPONINI in the last 168 hours. BNP (last 3 results) No results for input(s): PROBNP in the last 8760 hours. HbA1C: No results for input(s): HGBA1C in the last 72 hours. CBG: Recent Labs  Lab 10/08/17 0606  GLUCAP 115*   Lipid Profile: No results for input(s): CHOL, HDL, LDLCALC, TRIG, CHOLHDL, LDLDIRECT in the last 72 hours. Thyroid Function Tests: No results for input(s): TSH, T4TOTAL, FREET4, T3FREE, THYROIDAB in the last 72 hours. Anemia Panel: No results for input(s): VITAMINB12, FOLATE, FERRITIN, TIBC, IRON, RETICCTPCT in the last 72 hours. Urine analysis:    Component Value Date/Time   COLORURINE YELLOW 10/07/2017 0118   APPEARANCEUR CLOUDY (A) 10/07/2017 0118   LABSPEC 1.013 10/07/2017 0118   PHURINE 6.0 10/07/2017 0118   GLUCOSEU NEGATIVE 10/07/2017 0118   HGBUR NEGATIVE 10/07/2017 0118   BILIRUBINUR NEGATIVE 10/07/2017  [place] 10/07/2017 0118   PROTEINUR NEGATIVE 10/07/2017 0118   UROBILINOGEN 0.2 02/23/2013 0956   NITRITE POSITIVE (A) 10/07/2017 0118   LEUKOCYTESUR LARGE (A) 10/07/2017 0118   Sepsis Labs: @LABRCNTIP (procalcitonin:4,lacticidven:4)  ) Recent Results (from the past 240 hour(s))  Surgical PCR screen     Status: None   Collection Time: 10/08/17  4:46 AM  Result Value Ref Range Status   MRSA, PCR NEGATIVE NEGATIVE Final   Staphylococcus aureus NEGATIVE NEGATIVE Final    Comment: (NOTE) The Xpert SA Assay (FDA approved for NASAL specimens in patients 34 years of age and older), is one component of a comprehensive surveillance program. It is not intended to diagnose infection nor  to guide or monitor treatment. Performed at [facility], [place] [address], [place], [place] [address]       Radiology Studies: Pelvis Portable  Result Date: 10/08/2017 CLINICAL DATA:  Postop left hip hemiarthroplasty. EXAM: PORTABLE PELVIS 1-2 VIEWS COMPARISON:  10/17/2015 FINDINGS: Subcutaneous emphysema is seen about the left hip status post left bipolar uncemented hip arthroplasty. No immediate postoperative complication is noted. No joint dislocation or fracture is apparent for the single frontal view. Pre-existing right total hip arthroplasty is identified without evidence of loosening or hardware failure. Included pelvis appears intact. IMPRESSION: New left bipolar hip arthroplasty with postop soft tissue change. No immediate complications noted. Electronically Signed   By: Ashley Royalty M.D.   On: 10/08/2017 18:38     Scheduled Meds: . acetaminophen  500 mg Oral Q6H  . aspirin EC  325 mg Oral Q breakfast  . bupivacaine liposome  20 mL Infiltration Once  . carvedilol  6.25 mg Oral BID  . clopidogrel  75 mg Oral Daily  . docusate sodium  100 mg Oral BID  . furosemide  40 mg Oral Daily  . isosorbide mononitrate  90 mg Oral Daily  . multivitamin with minerals  1 tablet Oral Daily  . simvastatin  20 mg Oral q1800   Continuous Infusions: . sodium chloride 10 mL/hr at 10/06/17 2328  . lactated ringers       LOS: 2 days   Time Spent in minutes   45 minutes  [name] D.O. on 10/09/2017 at 12:33 PM  Between 7am to 7pm - Pager - [phone]  After 7pm go to [email or web address] - password [email or web address]  And look for the night coverage person covering for me after hours  [facility] Office  [phone]

## 2017-10-09 NOTE — Progress Notes (Signed)
Pt incision bleeding and dressing changed twice this shift. Steri-strips and ice added. MD notified. Pt asymptomatic. Hgb stable. VSS. Will continue to monitor pt closely. Leanne Chang, RN

## 2017-10-09 NOTE — Evaluation (Signed)
Physical Therapy Evaluation Patient Details Name: [name] MRN: [id number] DOB: [birth year] Today's Date: 10/09/2017   History of Present Illness  [name] is a 84 y.o. female with medical history significant of hypertension, hyperlipidemia, GERD, pacemaker placement, MVR, left bundle blockage, dCHF, CAD, s/p of stent placement, bilateral breast cancer (s/p of bilateral mastectomy), pulmonary hypertension, GERD, CKD-3, who presents with fall and the left hip pain. Now s/p L hip hemiarthoplasty with posterior hip precautions. 3/07    Clinical Impression  Patient is s/p above surgery resulting in functional limitations due to the deficits listed below (see PT Problem List). PTA, pt living at SNF ambulating with RW. Upon eval pt presents with high post op pain and generalized weakness taht limit her mobility at this time. Currently Mod-Max A level for bed mobility, unable to stand this visit. Max A for squat pivot into bedside chair and will need +2 physical assistance for safe standing or attempts at ambulation.  Patient will benefit from skilled PT to increase their independence and safety with mobility to allow discharge to the venue listed below.       Follow Up Recommendations SNF;Supervision/Assistance - 24 hour    Equipment Recommendations  None recommended by PT    Recommendations for Other Services       Precautions / Restrictions Precautions Precautions: Posterior Hip Precaution Booklet Issued: Yes (comment) Precaution Comments: revewied hip precautions with patient and daughter.  Restrictions Weight Bearing Restrictions: Yes LLE Weight Bearing: Weight bearing as tolerated      Mobility  Bed Mobility Overal bed mobility: Needs Assistance Bed Mobility: Supine to Sit     Supine to sit: Max assist     General bed mobility comments: max A to assist with surgical limb over EOB, and bringing trunk up to sitting.   Transfers Overall transfer level: Needs  assistance Equipment used: 1 person hand held assist Transfers: Squat Pivot Transfers;Lateral/Scoot Transfers     Squat pivot transfers: Max assist    Lateral/Scoot Transfers: Max assist General transfer comment: Patient requiring max A for transfers at this time. lateral scoots on bed squat pivot into bedside chair.   Ambulation/Gait             General Gait Details: unable  Stairs            Wheelchair Mobility    Modified Rankin (Stroke Patients Only)       Balance Overall balance assessment: History of Falls;Needs assistance Sitting-balance support: Feet unsupported;No upper extremity supported Sitting balance-Leahy Scale: Fair       Standing balance-Leahy Scale: Zero Standing balance comment: total A for standing at this time                             Pertinent Vitals/Pain Pain Assessment: Faces Faces Pain Scale: Hurts even more Pain Location: L hip Pain Descriptors / Indicators: Discomfort;Guarding;Operative site guarding Pain Intervention(s): Limited activity within patient's tolerance;Monitored during session;Premedicated before session;Repositioned    Home Living Family/patient expects to be discharged to:: Skilled nursing facility                      Prior Function Level of Independence: Needs assistance   Gait / Transfers Assistance Needed: walking at SNF with RW           Hand Dominance        Extremity/Trunk Assessment   Upper Extremity Assessment Upper Extremity Assessment: Defer  to OT evaluation;Generalized weakness    Lower Extremity Assessment Lower Extremity Assessment: Generalized weakness;LLE deficits/detail LLE Deficits / Details: ROM and weakness consistent with above procedure.        Communication   Communication: HOH  Cognition Arousal/Alertness: Awake/alert Behavior During Therapy: WFL for tasks assessed/performed Overall Cognitive Status: Within Functional Limits for tasks assessed                                         General Comments General comments (skin integrity, edema, etc.): light bleeding at incision site noted, RN notified. Extensive discussion with pt and family over posterior hip precautions.     Exercises     Assessment/Plan    PT Assessment Patient needs continued PT services  PT Problem List Decreased strength;Decreased range of motion;Decreased activity tolerance;Decreased balance;Decreased mobility;Decreased knowledge of use of DME;Pain       PT Treatment Interventions DME instruction;Gait training;Stair training;Functional mobility training;Therapeutic exercise;Therapeutic activities;Balance training    PT Goals (Current goals can be found in the Care Plan section)  Acute Rehab PT Goals Patient Stated Goal: go to rehab PT Goal Formulation: With patient Time For Goal Achievement: 10/16/17 Potential to Achieve Goals: Good    Frequency Min 3X/week   Barriers to discharge        Co-evaluation               AM-PAC PT "6 Clicks" Daily Activity  Outcome Measure Difficulty turning over in bed (including adjusting bedclothes, sheets and blankets)?: Unable Difficulty moving from lying on back to sitting on the side of the bed? : Unable Difficulty sitting down on and standing up from a chair with arms (e.g., wheelchair, bedside commode, etc,.)?: Unable Help needed moving to and from a bed to chair (including a wheelchair)?: Total Help needed walking in hospital room?: Total Help needed climbing 3-5 steps with a railing? : Total 6 Click Score: 6    End of Session Equipment Utilized During Treatment: Gait belt Activity Tolerance: Patient limited by fatigue;Patient limited by pain Patient left: in chair;with call bell/phone within reach;with family/visitor present Nurse Communication: Mobility status;Need for lift equipment PT Visit Diagnosis: Unsteadiness on feet (R26.81);Other abnormalities of gait and mobility  (R26.89);Muscle weakness (generalized) (M62.81);History of falling (Z91.81);Pain;Difficulty in walking, not elsewhere classified (R26.2) Pain - Right/Left: Left Pain - part of body: Hip    Time: 1215-1259 PT Time Calculation (min) (ACUTE ONLY): 44 min   Charges:   PT Evaluation $PT Eval Moderate Complexity: 1 Mod PT Treatments $Therapeutic Activity: 8-22 mins $Self Care/Home Management: 8-22   PT G Codes:        Reinaldo Berber, PT, DPT Acute Rehab Services Pager: [phone]    Reinaldo Berber 10/09/2017, 1:44 PM

## 2017-10-10 LAB — BASIC METABOLIC PANEL
Anion gap: 11 (ref 5–15)
BUN: 32 mg/dL — ABNORMAL HIGH (ref 6–20)
CO2: 26 mmol/L (ref 22–32)
Calcium: 7.9 mg/dL — ABNORMAL LOW (ref 8.9–10.3)
Chloride: 100 mmol/L — ABNORMAL LOW (ref 101–111)
Creatinine, Ser: 1.84 mg/dL — ABNORMAL HIGH (ref 0.44–1.00)
GFR calc Af Amer: 26 mL/min — ABNORMAL LOW (ref >60)
GFR calc non Af Amer: 23 mL/min — ABNORMAL LOW (ref >60)
Glucose, Bld: 107 mg/dL — ABNORMAL HIGH (ref 65–99)
Potassium: 4.7 mmol/L (ref 3.5–5.1)
Sodium: 137 mmol/L (ref 135–145)

## 2017-10-10 LAB — CBC
HCT: 20.2 % — ABNORMAL LOW (ref 36.0–46.0)
Hemoglobin: 6.4 g/dL — CL (ref 12.0–15.0)
MCH: 31.8 pg (ref 26.0–34.0)
MCHC: 31.7 g/dL (ref 30.0–36.0)
MCV: 100.5 fL — ABNORMAL HIGH (ref 78.0–100.0)
Platelets: 154 10*3/uL (ref 150–400)
RBC: 2.01 MIL/uL — ABNORMAL LOW (ref 3.87–5.11)
RDW: 14.9 % (ref 11.5–15.5)
WBC: 10 10*3/uL (ref 4.0–10.5)

## 2017-10-10 LAB — PREPARE RBC (CROSSMATCH)

## 2017-10-10 LAB — HEMOGLOBIN AND HEMATOCRIT, BLOOD
HCT: 26.1 % — ABNORMAL LOW (ref 36.0–46.0)
Hemoglobin: 8.4 g/dL — ABNORMAL LOW (ref 12.0–15.0)

## 2017-10-10 MED ORDER — FUROSEMIDE 10 MG/ML IJ SOLN: 20.0000 mg | Freq: Once | INTRAMUSCULAR | Status: DC

## 2017-10-10 MED ORDER — SODIUM CHLORIDE 0.9 % IV SOLN
Freq: Once | INTRAVENOUS | Status: AC
  Administered 2017-10-10: 10:00:00 via INTRAVENOUS

## 2017-10-10 MED ORDER — SODIUM CHLORIDE 0.9 % IV SOLN: Freq: Once | INTRAVENOUS | Status: DC

## 2017-10-10 NOTE — Progress Notes (Signed)
PROGRESS NOTE    [name]  GYI:[id number] DOB: Dec 05, 1922 DOA: 10/06/2017 PCP: Gaynelle Arabian, MD   Brief Narrative:  HPI on 10/07/2017 by Dr. Ivor Costa [name] is a 84 y.o. female with medical history significant of hypertension, hyperlipidemia, GERD, pacemaker placement, MVR, left bundle blockage, dCHF, CAD, s/p of stent placement, bilateral breast cancer (s/p of bilateral mastectomy), pulmonary hypertension, GERD, CKD-3, who presents with fall and the left hip pain.  Pt states that she fell when she was walking toward TV using her walker and slipped. No LOC. She strongly denies any injury to head and the neck. She injured her left hip, and developed left hip pain, which is constant, sharp, 3 out of 10 in severity, nonradiating. No leg numbness. Patient does not have unilateral weakness, numbness or tingling to extremities. No facial droop or slurred speech. Denies chest pain, shortness of breath, nausea, vomiting, diarrhea, abdominal pain, symptoms of UTI.  Interim history Admitted for left femoral neck fracture.  Orthopedics consulted, s/p surgery. Now AKI and anemia. Will transfuse and continue to monitor  Assessment & Plan   Left femoral neck fracture -X-ray: Acute displaced left femoral neck fracture -Secondary to fall -Cardiology consulted for preoperative assessment- low to moderate risk -Orthopedic surgery consulted and appreciated- s/p left hip himiarthroplasty -Continue pain control -PT rec SNF -OT pending   Acute blood loss anemia/Acute on chronic macrocytic anemia/Symptomatic anemia -Possibly secondary to surgery versus delusional component -Baseline hemoglobin approximately 10 -Hemoglobin this morning dropped to 6.4 today -Patient complaining of feeling weak and tired -Will hold plavix and aspirin -Transfuse 2uPRBCs -Continue to monitor CBC  Hypotension -patient currently anemic and received coreg and oral lasix today prior to transfusion -will continue  to monitor closely   Acute kidney injury on chronic kidney disease, stage III -Creatinine today 1.84, baseline approximately 1.1-1.3 -Suspect secondary to anemia and hypo-perfusion -Patient will be receiving 2 units PRBC -Will continue to monitor BMP  Fall -Mechanical -Patient denied hitting her head, refused CT head and neck on admission -Patient states she does use a walker for ambulation -PT and OT consulted  Nonsustained V. Tach -Patient had 5 beat run of V. tach today, currently asymptomatic -Last magnesium was 2.6 on 10/09/2017 -Potassium currently 4.7 -Possibly secondary to blood loss.  As above, patient will receive transfusion -Will continue to monitor closely  Chronic diastolic heart failure -Echocardiogram 11/24/2016 showed EF of 35-00%, grade 1 diastolic dysfunction -Patient currently appears to be euvolemic and compensated.  She does have very trace amount of lower extremity edema however no JVD or shortness of breath -Continue Lasix -BNP 161  Coronary artery disease -Currently denies any chest pain -Plavix held due to anemia -Continue Coreg, Imdur, statin  Hyperlipidemia -Continue statin  Essential hypertension -Continue Coreg, hydralazine as needed  Hypokalemia -Resolved, continue to monitor BMP  Asymptomatic bacteriuria -UA showed-large leukocytes, positive nitrites, rare bacteria, TNTC WBC -Patient denies any current pain or burning with urination -Currently afebrile with no leukocytosis -Will not treat with antibiotics  DVT Prophylaxis  SCDs  Code Status: DNR  Family Communication: None at bedside  Disposition Plan: Admitted, pending improvement in anemia as well as kidney function.  Dispo to be determined.  Consultants Cardiology Orthopedic surgery  Procedures  left hip himiarthroplasty  Antibiotics   Anti-infectives (From admission, onward)   Start     Dose/Rate Route Frequency Ordered Stop   10/08/17 2000  ceFAZolin (ANCEF) IVPB  2g/100 mL premix     2 g 200  mL/hr over 30 Minutes Intravenous Every 6 hours 10/08/17 1712 10/08/17 2219      Subjective:   [name] seen and examined today.  Patient states she feels weak and "like a wet rag".  Denies current chest pain, shortness of breath, abdominal pain, nausea or vomiting, diarrhea or constipation, dizziness or headache.  States her hip pain is 1 out of 10.  Objective:   Vitals:   10/10/17 1015 10/10/17 1030 10/10/17 1045 10/10/17 1051  BP: (!) 86/54 (!) 88/55 (!) 81/31 (!) 82/36  Pulse: 72 70 68 70  Resp: 17 17 17 17   Temp: 98.3 F (36.8 C) 98.6 F (37 C)    TempSrc: Oral Oral    SpO2: 97% 97% 95%   Weight:      Height:        Intake/Output Summary (Last 24 hours) at 10/10/2017 1103 Last data filed at 10/10/2017 0500 Gross per 24 hour  Intake 360 ml  Output 300 ml  Net 60 ml   Filed Weights   10/08/17 1207  Weight: 79.4 kg (175 lb)   Exam  General: Well developed, well nourished, NAD, appears stated age  HEENT: NCAT, mucous membranes moist.   Neck: Supple  Cardiovascular: S1 S2 auscultated, RRR   Respiratory: Clear to auscultation bilaterally with equal chest rise  Abdomen: Soft, nontender, nondistended, + bowel sounds  Extremities: warm dry without cyanosis clubbing no pitting edema, dressing in place on left hip.  Neuro: AAOx3, patient with glaucoma and hard of hearing otherwise nonfocal  Psych: pleasant, appropriate mood and affect  Data Reviewed: I have personally reviewed following labs and imaging studies  CBC: Recent Labs  Lab 10/06/17 2320 10/07/17 0336 10/08/17 0524 10/09/17 0516 10/09/17 1254 10/10/17 0355  WBC 9.4 10.7* 8.7 10.5  --  10.0  NEUTROABS 7.7  --   --   --   --   --   HGB 10.4* 10.2* 10.0* 7.9* 8.1* 6.4*  HCT 33.6* 33.3* 32.1* 25.3* 26.1* 20.2*  MCV 101.5* 100.9* 103.2* 102.8*  --  100.5*  PLT 191 179 156 141*  --  539   Basic Metabolic Panel: Recent Labs  Lab 10/06/17 2320 10/07/17 0336  10/08/17 0524 10/09/17 0516 10/10/17 0355  NA 141 142 143 142 137  K 3.5 3.4* 3.4* 4.2 4.7  CL 101 103 103 104 100*  CO2 28 28 30 29 26   GLUCOSE 117* 105* 105* 103* 107*  BUN 22* 21* 21* 25* 32*  CREATININE 1.24* 1.16* 1.16* 1.30* 1.84*  CALCIUM 9.0 8.8* 8.7* 8.2* 7.9*  MG  --   --   --  2.6*  --    GFR: Estimated Creatinine Clearance: 19.9 mL/min (A) (by C-G formula based on SCr of 1.84 mg/dL (H)). Liver Function Tests: Recent Labs  Lab 10/06/17 2320  AST 19  ALT 14  ALKPHOS 97  BILITOT 0.8  PROT 6.1*  ALBUMIN 3.4*   No results for input(s): LIPASE, AMYLASE in the last 168 hours. No results for input(s): AMMONIA in the last 168 hours. Coagulation Profile: Recent Labs  Lab 10/06/17 2320  INR 1.06   Cardiac Enzymes: No results for input(s): CKTOTAL, CKMB, CKMBINDEX, TROPONINI in the last 168 hours. BNP (last 3 results) No results for input(s): PROBNP in the last 8760 hours. HbA1C: No results for input(s): HGBA1C in the last 72 hours. CBG: Recent Labs  Lab 10/08/17 0606  GLUCAP 115*   Lipid Profile: No results for input(s): CHOL, HDL, LDLCALC, TRIG, CHOLHDL, LDLDIRECT  in the last 72 hours. Thyroid Function Tests: No results for input(s): TSH, T4TOTAL, FREET4, T3FREE, THYROIDAB in the last 72 hours. Anemia Panel: No results for input(s): VITAMINB12, FOLATE, FERRITIN, TIBC, IRON, RETICCTPCT in the last 72 hours. Urine analysis:    Component Value Date/Time   COLORURINE YELLOW 10/07/2017 0118   APPEARANCEUR CLOUDY (A) 10/07/2017 0118   LABSPEC 1.013 10/07/2017 0118   PHURINE 6.0 10/07/2017 0118   GLUCOSEU NEGATIVE 10/07/2017 0118   HGBUR NEGATIVE 10/07/2017 0118   BILIRUBINUR NEGATIVE 10/07/2017 0118   KETONESUR NEGATIVE 10/07/2017 0118   PROTEINUR NEGATIVE 10/07/2017 0118   UROBILINOGEN 0.2 02/23/2013 0956   NITRITE POSITIVE (A) 10/07/2017 0118   LEUKOCYTESUR LARGE (A) 10/07/2017 0118   Sepsis  Labs: @LABRCNTIP (procalcitonin:4,lacticidven:4)  ) Recent Results (from the past 240 hour(s))  Surgical PCR screen     Status: None   Collection Time: 10/08/17  4:46 AM  Result Value Ref Range Status   MRSA, PCR NEGATIVE NEGATIVE Final   Staphylococcus aureus NEGATIVE NEGATIVE Final    Comment: (NOTE) The Xpert SA Assay (FDA approved for NASAL specimens in patients 65 years of age and older), is one component of a comprehensive surveillance program. It is not intended to diagnose infection nor to guide or monitor treatment. Performed at [facility], [place] [address], [place], [place] [address]       Radiology Studies: Pelvis Portable  Result Date: 10/08/2017 CLINICAL DATA:  Postop left hip hemiarthroplasty. EXAM: PORTABLE PELVIS 1-2 VIEWS COMPARISON:  10/17/2015 FINDINGS: Subcutaneous emphysema is seen about the left hip status post left bipolar uncemented hip arthroplasty. No immediate postoperative complication is noted. No joint dislocation or fracture is apparent for the single frontal view. Pre-existing right total hip arthroplasty is identified without evidence of loosening or hardware failure. Included pelvis appears intact. IMPRESSION: New left bipolar hip arthroplasty with postop soft tissue change. No immediate complications noted. Electronically Signed   By: Ashley Royalty M.D.   On: 10/08/2017 18:38     Scheduled Meds: . aspirin EC  325 mg Oral Q breakfast  . bupivacaine liposome  20 mL Infiltration Once  . carvedilol  6.25 mg Oral BID  . docusate sodium  100 mg Oral BID  . furosemide  40 mg Oral Daily  . isosorbide mononitrate  90 mg Oral Daily  . multivitamin with minerals  1 tablet Oral Daily  . simvastatin  20 mg Oral q1800   Continuous Infusions: . sodium chloride 10 mL/hr at 10/06/17 2328  . sodium chloride    . lactated ringers       LOS: 3 days   Time Spent in minutes   45 minutes  Tanyika Barros D.O. on 10/10/2017 at 11:03 AM  Between 7am to 7pm  - Pager - [phone]  After 7pm go to [email or web address] - password [email or web address]  And look for the night coverage person covering for me after hours  [facility] Office  [phone]

## 2017-10-10 NOTE — Progress Notes (Signed)
Subjective: 2 Days Post-Op Procedure(s) (LRB): LEFT HIP HIMIARTHROPLASTY (Left) Patient reports pain as mild.  The patient has minimal left hip pain. She is sitting in bed eating breakfast.  Objective: Vital signs in last 24 hours: Temp:  [98 F (36.7 C)-98.6 F (37 C)] 98.6 F (37 C) (03/09 1030) Pulse Rate:  [68-79] 73 (03/09 1222) Resp:  [15-17] 17 (03/09 1051) BP: (81-107)/(31-55) 101/46 (03/09 1222) SpO2:  [95 %-99 %] 95 % (03/09 1045)  Intake/Output from previous day: 03/08 0701 - 03/09 0700 In: 480 [P.O.:480] Out: 400 [Urine:400] Intake/Output this shift: Total I/O In: -  Out: 300 [Urine:300]  Recent Labs    10/08/17 0524 10/09/17 0516 10/09/17 1254 10/10/17 0355  HGB 10.0* 7.9* 8.1* 6.4*   Recent Labs    10/09/17 0516 10/09/17 1254 10/10/17 0355  WBC 10.5  --  10.0  RBC 2.46*  --  2.01*  HCT 25.3* 26.1* 20.2*  PLT 141*  --  154   Recent Labs    10/09/17 0516 10/10/17 0355  NA 142 137  K 4.2 4.7  CL 104 100*  CO2 29 26  BUN 25* 32*  CREATININE 1.30* 1.84*  GLUCOSE 103* 107*  CALCIUM 8.2* 7.9*   No results for input(s): LABPT, INR in the last 72 hours. Left hip exam: Neurovascular intact Sensation intact distally Intact pulses distally Dorsiflexion/Plantar flexion intact Incision: dressing C/D/I Compartment soft  Assessment/Plan: 2 Days Post-Op Procedure(s) (LRB): LEFT HIP HIMIARTHROPLASTY (Left)  Acute blood loss anemia. Symptomatic Plan: Agree with transfusing 2 units of packed RBCs. Continue aspirin 325 mg once daily with SCDs for DVT prophylaxis. When medically stable can progress with physical therapy, weightbearing as tolerated on the left with posterior hip precautions. We will follow the patient along with you.  Cannon Quinton G 10/10/2017, 12:51 PM

## 2017-10-10 NOTE — Evaluation (Signed)
Occupational Therapy Evaluation Patient Details Name: [name] MRN: [id number] DOB: 24-Mar-1923 Today's Date: 10/10/2017    History of Present Illness [name] is a 84 y.o. female with medical history significant of hypertension, hyperlipidemia, GERD, pacemaker placement, MVR, left bundle blockage, dCHF, CAD, s/p of stent placement, bilateral breast cancer (s/p of bilateral mastectomy), pulmonary hypertension, GERD, CKD-3, who presents with fall and the left hip pain. Now s/p L hip hemiarthoplasty with posterior hip precautions. 3/07   Clinical Impression   Pt reports she was independent with ADL PTA. Currently pt overall mod assist +2 for sit to stand from EOB and max assist for LB ADL. Recommending SNF for follow up to maximize independence and safety with ADL and functional mobility. Pt would benefit from continued skilled OT to address established goals.    Follow Up Recommendations  SNF    Equipment Recommendations  Other (comment)(TBD at next venue)    Recommendations for Other Services       Precautions / Restrictions Precautions Precautions: Posterior Hip Precaution Booklet Issued: No Precaution Comments: Reviewed hip precautions with pt Required Braces or Orthoses: Knee Immobilizer - Left Knee Immobilizer - Left: (KI donned at start of session) Restrictions Weight Bearing Restrictions: Yes LLE Weight Bearing: Weight bearing as tolerated      Mobility Bed Mobility Overal bed mobility: Needs Assistance Bed Mobility: Rolling;Sidelying to Sit;Sit to Supine Rolling: Mod assist Sidelying to sit: Max assist   Sit to supine: Max assist   General bed mobility comments: Pt assisting with all aspects of bed mobility but requires assist for rolling, LEs off EOB, and trunk elevation to sitting. Use of bed rail with HOB flat  Transfers Overall transfer level: Needs assistance Equipment used: Rolling walker (2 wheeled) Transfers: Sit to/from Stand Sit to Stand: Mod  assist;+2 physical assistance         General transfer comment: Mod assist +2 to boost up from EOB x1. Attempted to side step toward [facility] for repositioning but pt with difficulty picking up feet and weight shifting. Cues for hand placement and technique throghout.    Balance Overall balance assessment: Needs assistance;History of Falls Sitting-balance support: Feet supported;No upper extremity supported Sitting balance-Leahy Scale: Good Sitting balance - Comments: Once she obtained her balance sitting EOB she was able to for ~10 mintues with min guard assist   Standing balance support: Bilateral upper extremity supported Standing balance-Leahy Scale: Poor Standing balance comment: RW for support                           ADL either performed or assessed with clinical judgement   ADL Overall ADL's : Needs assistance/impaired Eating/Feeding: Set up;Sitting   Grooming: Minimal assistance;Sitting   Upper Body Bathing: Minimal assistance;Sitting   Lower Body Bathing: Maximal assistance;+2 for physical assistance;Sit to/from stand   Upper Body Dressing : Minimal assistance;Sitting   Lower Body Dressing: Maximal assistance;Sit to/from stand                 General ADL Comments: Pt required mod assist +2 for sit to stand from EOB. Tolerated ~7 minutes at EOB with min guard assist. SpO2 in mid 90s throughout on RA.     Vision         Perception     Praxis      Pertinent Vitals/Pain Pain Assessment: Faces Faces Pain Scale: Hurts even more Pain Location: L hip Pain Descriptors / Indicators: Aching;Sore Pain Intervention(s): Monitored  during session;Repositioned;Patient requesting pain meds-RN notified     Hand Dominance     Extremity/Trunk Assessment Upper Extremity Assessment Upper Extremity Assessment: Generalized weakness   Lower Extremity Assessment Lower Extremity Assessment: Defer to PT evaluation   Cervical / Trunk Assessment Cervical / Trunk  Assessment: Kyphotic   Communication Communication Communication: HOH   Cognition Arousal/Alertness: Awake/alert Behavior During Therapy: WFL for tasks assessed/performed Overall Cognitive Status: Within Functional Limits for tasks assessed                                     General Comments       Exercises     Shoulder Instructions      Home Living Family/patient expects to be discharged to:: Skilled nursing facility                                 Additional Comments: Pt from [facility]      Prior Functioning/Environment Level of Independence: Independent        Comments: Per daughter, pt independent at ILF only has help donning compression stockings        OT Problem List: Decreased strength;Decreased range of motion;Decreased activity tolerance;Impaired balance (sitting and/or standing);Decreased knowledge of use of DME or AE;Decreased knowledge of precautions;Pain      OT Treatment/Interventions: Self-care/ADL training;Therapeutic exercise;Energy conservation;DME and/or AE instruction;Therapeutic activities;Patient/family education;Balance training    OT Goals(Current goals can be found in the care plan section) Acute Rehab OT Goals Patient Stated Goal: go to rehab OT Goal Formulation: With patient Time For Goal Achievement: 10/24/17 Potential to Achieve Goals: Good ADL Goals Pt Will Perform Lower Body Bathing: with min assist;sit to/from stand;with adaptive equipment Pt Will Perform Lower Body Dressing: with adaptive equipment;sit to/from stand;with min assist Pt Will Transfer to Toilet: with mod assist;stand pivot transfer;bedside commode Pt Will Perform Toileting - Clothing Manipulation and hygiene: with min assist;sit to/from stand  OT Frequency: Min 2X/week   Barriers to D/C:            Co-evaluation              AM-PAC PT "6 Clicks" Daily Activity     Outcome Measure Help from another person eating meals?:  None Help from another person taking care of personal grooming?: A Little Help from another person toileting, which includes using toliet, bedpan, or urinal?: A Lot Help from another person bathing (including washing, rinsing, drying)?: A Lot Help from another person to put on and taking off regular upper body clothing?: A Little Help from another person to put on and taking off regular lower body clothing?: A Lot 6 Click Score: 16   End of Session Equipment Utilized During Treatment: Gait belt;Rolling walker;Left knee immobilizer;Oxygen Nurse Communication: Mobility status;Patient requests pain meds  Activity Tolerance: Patient tolerated treatment well Patient left: in bed;with call bell/phone within reach;with family/visitor present;with SCD's reapplied  OT Visit Diagnosis: Unsteadiness on feet (R26.81);Other abnormalities of gait and mobility (R26.89);Muscle weakness (generalized) (M62.81);History of falling (Z91.81);Pain Pain - Right/Left: Left Pain - part of body: Hip                Time: 1450-1530 OT Time Calculation (min): 40 min Charges:  OT General Charges $OT Visit: 1 Visit OT Evaluation $OT Eval Moderate Complexity: 1 Mod OT Treatments $Therapeutic Activity: 23-37 mins G-Codes:  Chayanne Filippi A. Ulice Brilliant, M.S., OTR/L Pager: [place] 10/10/2017, 5:20 PM

## 2017-10-10 NOTE — Progress Notes (Signed)
Orthopedic Tech Progress Note Patient Details:  [name] 03/07/1923 [id number]  Ortho Devices Ortho Device/Splint Location: Trapeze bar   Post Interventions Patient Tolerated: Unable to use device properly Instructions Provided: Care of device   [place] Pink 10/10/2017, 1:32 PM

## 2017-10-10 NOTE — Progress Notes (Signed)
Pt BP is low 84/34, HR 70, Resp 17, O2 95% Lena 2L, telemetry called with a 5 beat run of Vtach, pt is asymptomatic. 1st unit of RBC is being administered. Pt is alert/oriented. CN Rodena Piety) and MD (Dr. Jeneen Rinks) notified and aware of situation. Will continue to monitor patient. Family member is in room with patient at this time.

## 2017-10-10 NOTE — Progress Notes (Signed)
CRITICAL VALUE ALERT  Critical Value:  Hgb. 6.4  Date & Time Notied:  10/10/17 0430  Provider Notified: Blount,NP  Orders Received/Actions taken: [id number] orders placed

## 2017-10-10 NOTE — Plan of Care (Signed)
  Education: Knowledge of General Education information will improve 10/10/2017 0425 - Progressing by Anson Fret, RN Note POC reviewed with pt.

## 2017-10-11 ENCOUNTER — Inpatient Hospital Stay (HOSPITAL_COMMUNITY): Payer: Medicare Other

## 2017-10-11 LAB — TYPE AND SCREEN
ABO/RH(D): O NEG
Antibody Screen: NEGATIVE
Unit division: 0
Unit division: 0
Unit division: 0
Unit division: 0

## 2017-10-11 LAB — BASIC METABOLIC PANEL
Anion gap: 9 (ref 5–15)
BUN: 43 mg/dL — ABNORMAL HIGH (ref 6–20)
CO2: 28 mmol/L (ref 22–32)
Calcium: 8 mg/dL — ABNORMAL LOW (ref 8.9–10.3)
Chloride: 99 mmol/L — ABNORMAL LOW (ref 101–111)
Creatinine, Ser: 1.8 mg/dL — ABNORMAL HIGH (ref 0.44–1.00)
GFR calc Af Amer: 27 mL/min — ABNORMAL LOW (ref >60)
GFR calc non Af Amer: 23 mL/min — ABNORMAL LOW (ref >60)
Glucose, Bld: 95 mg/dL (ref 65–99)
Potassium: 4.3 mmol/L (ref 3.5–5.1)
Sodium: 136 mmol/L (ref 135–145)

## 2017-10-11 LAB — CBC
HCT: 25.5 % — ABNORMAL LOW (ref 36.0–46.0)
Hemoglobin: 8.4 g/dL — ABNORMAL LOW (ref 12.0–15.0)
MCH: 31.5 pg (ref 26.0–34.0)
MCHC: 32.9 g/dL (ref 30.0–36.0)
MCV: 95.5 fL (ref 78.0–100.0)
Platelets: 150 10*3/uL (ref 150–400)
RBC: 2.67 MIL/uL — ABNORMAL LOW (ref 3.87–5.11)
RDW: 17.8 % — ABNORMAL HIGH (ref 11.5–15.5)
WBC: 8.9 10*3/uL (ref 4.0–10.5)

## 2017-10-11 LAB — BPAM RBC
Blood Product Expiration Date: 201903162359
Blood Product Expiration Date: 201903182359
Blood Product Expiration Date: 201903182359
Blood Product Expiration Date: 201903242359
ISSUE DATE / TIME: 201903061459
ISSUE DATE / TIME: 201903091010
ISSUE DATE / TIME: 201903091408
Unit Type and Rh: 9500
Unit Type and Rh: 9500
Unit Type and Rh: 9500
Unit Type and Rh: 9500

## 2017-10-11 LAB — MAGNESIUM: Magnesium: 2.6 mg/dL — ABNORMAL HIGH (ref 1.7–2.4)

## 2017-10-11 NOTE — Progress Notes (Signed)
Subjective: 3 Days Post-Op Procedure(s) (LRB): LEFT HIP HIMIARTHROPLASTY (Left) Patient reports pain as mild.  The patient's daughter is at the bedside.  She has not been out of bed today.  No frank dizziness or feeling of weakness.  Objective: Vital signs in last 24 hours: Temp:  [97.9 F (36.6 C)-98.5 F (36.9 C)] 97.9 F (36.6 C) (03/10 0508) Pulse Rate:  [71-79] 72 (03/10 0508) Resp:  [16-18] 16 (03/10 0508) BP: (93-116)/(44-55) 116/55 (03/10 0508) SpO2:  [97 %-100 %] 100 % (03/10 0508)  Intake/Output from previous day: 03/09 0701 - 03/10 0700 In: 1343.5 [I.V.:713.5; Blood:630] Out: 850 [Urine:850] Intake/Output this shift: No intake/output data recorded.  Recent Labs    10/09/17 0516 10/09/17 1254 10/10/17 0355 10/10/17 1823 10/11/17 0618  HGB 7.9* 8.1* 6.4* 8.4* 8.4*   Recent Labs    10/10/17 0355 10/10/17 1823 10/11/17 0618  WBC 10.0  --  8.9  RBC 2.01*  --  2.67*  HCT 20.2* 26.1* 25.5*  PLT 154  --  150   Recent Labs    10/10/17 0355 10/11/17 0618  NA 137 136  K 4.7 4.3  CL 100* 99*  CO2 26 28  BUN 32* 43*  CREATININE 1.84* 1.80*  GLUCOSE 107* 95  CALCIUM 7.9* 8.0*   No results for input(s): LABPT, INR in the last 72 hours. Left hip exam: Neurovascular intact Sensation intact distally Intact pulses distally Dorsiflexion/Plantar flexion intact Incision: dressing C/D/I and no drainage Compartment soft  Her left calf is soft.  It is moderately tender.  Negative Homans.  Assessment/Plan: 3 Days Post-Op Procedure(s) (LRB): LEFT HIP HIMIARTHROPLASTY (Left) Acute blood loss anemia, status post transfusion of 2 units PRBCs yesterday. Plan: Mobilize patient with physical therapy weightbearing as tolerated on the left with posterior hip precautions. Aspirin/SCDs for DVT prophylaxis. I trimmed her knee immobilizer today. Will need skilled nursing facility. Encouraged incentive spirometry today.  [place] G 10/11/2017, 11:53 AM

## 2017-10-11 NOTE — Clinical Social Work Note (Signed)
Pt has chosen bed at [facility]. Facility notified. Daughter at bedside. CSW continuing to follow for medical stability and d/c.    [place], [place]

## 2017-10-11 NOTE — Progress Notes (Signed)
PROGRESS NOTE    [name]  JHE:[id number] DOB: 08/06/1922 DOA: 10/06/2017 PCP: Gaynelle Arabian, MD   Brief Narrative:  HPI on 10/07/2017 by Dr. Ivor Costa ZEN FELLING is a 84 y.o. female with medical history significant of hypertension, hyperlipidemia, GERD, pacemaker placement, MVR, left bundle blockage, dCHF, CAD, s/p of stent placement, bilateral breast cancer (s/p of bilateral mastectomy), pulmonary hypertension, GERD, CKD-3, who presents with fall and the left hip pain.  Pt states that she fell when she was walking toward TV using her walker and slipped. No LOC. She strongly denies any injury to head and the neck. She injured her left hip, and developed left hip pain, which is constant, sharp, 3 out of 10 in severity, nonradiating. No leg numbness. Patient does not have unilateral weakness, numbness or tingling to extremities. No facial droop or slurred speech. Denies chest pain, shortness of breath, nausea, vomiting, diarrhea, abdominal pain, symptoms of UTI.  Interim history Admitted for left femoral neck fracture.  Orthopedics consulted, s/p surgery. Now AKI and anemia- transfused.   Assessment & Plan   Left femoral neck fracture -X-ray: Acute displaced left femoral neck fracture -Secondary to fall -Cardiology consulted for preoperative assessment- low to moderate risk -Orthopedic surgery consulted and appreciated- s/p left hip himiarthroplasty -Continue pain control -PT/OT rec SNF   Acute blood loss anemia/Acute on chronic macrocytic anemia/Symptomatic anemia -Possibly secondary to surgery versus delusional component -Baseline hemoglobin approximately 10 -Hemoglobin dropped to 6.4 on 10/10/2017 -patient tranfused 2uPRBCs -aspirin, plavix held -Patient complained of weakness and feeling tired -Continue to monitor CBC  Hypotension -patient currently anemic and received coreg and oral lasix today prior to transfusion -have discontinued coreg and lasix -BP better today,  currently 116/55  Acute kidney injury on chronic kidney disease, stage III -Creatinine rose to 1.84, baseline approximately 1.1-1.3 -Suspect secondary to anemia and hypo-perfusion -Patient transfused 2 units PRBC -Creatinine currently 1.8 (given hypotension and anemia, may take some time for her creatinine to improve) -Obtained renal US, no hydronephrolosis, medical renal disease -Continue to monitor BMP  Fall -Mechanical -Patient denied hitting her head, refused CT head and neck on admission -Patient states she does use a walker for ambulation -PT and OT consulted- recommended SNF -Social work consulted  Nonsustained V. Tach -Patient had 5 beat run of V. tach on 10/10/2017 -Last magnesium was 2.6 on 10/09/2017 -Potassium currently 4.3, magnesium 2.6 -Possibly secondary to blood loss.   -Will continue to monitor closely  Chronic diastolic heart failure -Echocardiogram 11/24/2016 showed EF of 18-56%, grade 1 diastolic dysfunction -Patient currently appears to be euvolemic and compensated.  She does have very trace amount of lower extremity edema however no JVD or shortness of breath -BNP 161 -lasix held due to AKI and hypotension  Coronary artery disease -Currently denies any chest pain -Plavix held due to anemia -Continue Imdur, statin -coreg held  Hyperlipidemia -Continue statin  Essential hypertension -Continue hydralazine as needed  Hypokalemia -Resolved, continue to monitor BMP  Asymptomatic bacteriuria -UA showed-large leukocytes, positive nitrites, rare bacteria, TNTC WBC -Patient denies any current pain or burning with urination -Currently afebrile with no leukocytosis -Will not treat with antibiotics  DVT Prophylaxis  SCDs  Code Status: DNR  Family Communication: None at bedside  Disposition Plan: Admitted, pending improvement kidney function.  SNF at [facility] when medically stable  Consultants Cardiology Orthopedic surgery  Procedures  left hip  himiarthroplasty  Antibiotics   Anti-infectives (From admission, onward)   Start     Dose/Rate Route  Frequency Ordered Stop   10/08/17 2000  ceFAZolin (ANCEF) IVPB 2g/100 mL premix     2 g 200 mL/hr over 30 Minutes Intravenous Every 6 hours 10/08/17 1712 10/08/17 2219      Subjective:   Sarina Ill seen and examined today.  Denies hip pain today. No complains of chest pain, shortness of breath, abdominal pain, N/V/D/C, dizziness.   Objective:   Vitals:   10/10/17 1441 10/10/17 1733 10/10/17 2111 10/11/17 0508  BP: (!) 99/44 (!) 111/53 (!) 106/45 (!) 116/55  Pulse: 71 79 78 72  Resp: 17 18 16 16   Temp: 98.5 F (36.9 C) 98.5 F (36.9 C) 98.3 F (36.8 C) 97.9 F (36.6 C)  TempSrc: Oral Oral Oral Oral  SpO2: 97% 97% 98% 100%  Weight:      Height:        Intake/Output Summary (Last 24 hours) at 10/11/2017 1239 Last data filed at 10/11/2017 0626 Gross per 24 hour  Intake 1343.5 ml  Output 550 ml  Net 793.5 ml   Filed Weights   10/08/17 1207  Weight: 79.4 kg (175 lb)   Exam  General: Well developed, well nourished, NAD, appears stated age  HEENT: NCAT, mucous membranes moist.   Neck: Supple  Cardiovascular: S1 S2 auscultated, RRR  Respiratory: Clear to auscultation bilaterally with equal chest rise  Abdomen: Soft, nontender, nondistended, + bowel sounds  Extremities: warm dry without cyanosis clubbing or edema. Dressing in place on left hip.  Neuro: AAOx3, hard of hearing, otherwise nonfocal   Data Reviewed: I have personally reviewed following labs and imaging studies  CBC: Recent Labs  Lab 10/06/17 2320 10/07/17 0336 10/08/17 0524 10/09/17 0516 10/09/17 1254 10/10/17 0355 10/10/17 1823 10/11/17 0618  WBC 9.4 10.7* 8.7 10.5  --  10.0  --  8.9  NEUTROABS 7.7  --   --   --   --   --   --   --   HGB 10.4* 10.2* 10.0* 7.9* 8.1* 6.4* 8.4* 8.4*  HCT 33.6* 33.3* 32.1* 25.3* 26.1* 20.2* 26.1* 25.5*  MCV 101.5* 100.9* 103.2* 102.8*  --  100.5*  --  95.5    PLT 191 179 156 141*  --  154  --  948   Basic Metabolic Panel: Recent Labs  Lab 10/07/17 0336 10/08/17 0524 10/09/17 0516 10/10/17 0355 10/11/17 0618  NA 142 143 142 137 136  K 3.4* 3.4* 4.2 4.7 4.3  CL 103 103 104 100* 99*  CO2 28 30 29 26 28   GLUCOSE 105* 105* 103* 107* 95  BUN 21* 21* 25* 32* 43*  CREATININE 1.16* 1.16* 1.30* 1.84* 1.80*  CALCIUM 8.8* 8.7* 8.2* 7.9* 8.0*  MG  --   --  2.6*  --  2.6*   GFR: Estimated Creatinine Clearance: 20.3 mL/min (A) (by C-G formula based on SCr of 1.8 mg/dL (H)). Liver Function Tests: Recent Labs  Lab 10/06/17 2320  AST 19  ALT 14  ALKPHOS 97  BILITOT 0.8  PROT 6.1*  ALBUMIN 3.4*   No results for input(s): LIPASE, AMYLASE in the last 168 hours. No results for input(s): AMMONIA in the last 168 hours. Coagulation Profile: Recent Labs  Lab 10/06/17 2320  INR 1.06   Cardiac Enzymes: No results for input(s): CKTOTAL, CKMB, CKMBINDEX, TROPONINI in the last 168 hours. BNP (last 3 results) No results for input(s): PROBNP in the last 8760 hours. HbA1C: No results for input(s): HGBA1C in the last 72 hours. CBG: Recent Labs  Lab 10/08/17  0606  GLUCAP 115*   Lipid Profile: No results for input(s): CHOL, HDL, LDLCALC, TRIG, CHOLHDL, LDLDIRECT in the last 72 hours. Thyroid Function Tests: No results for input(s): TSH, T4TOTAL, FREET4, T3FREE, THYROIDAB in the last 72 hours. Anemia Panel: No results for input(s): VITAMINB12, FOLATE, FERRITIN, TIBC, IRON, RETICCTPCT in the last 72 hours. Urine analysis:    Component Value Date/Time   COLORURINE YELLOW 10/07/2017 0118   APPEARANCEUR CLOUDY (A) 10/07/2017 0118   LABSPEC 1.013 10/07/2017 0118   PHURINE 6.0 10/07/2017 0118   GLUCOSEU NEGATIVE 10/07/2017 0118   HGBUR NEGATIVE 10/07/2017 0118   BILIRUBINUR NEGATIVE 10/07/2017 0118   KETONESUR NEGATIVE 10/07/2017 0118   PROTEINUR NEGATIVE 10/07/2017 0118   UROBILINOGEN 0.2 02/23/2013 0956   NITRITE POSITIVE (A) 10/07/2017  0118   LEUKOCYTESUR LARGE (A) 10/07/2017 0118   Sepsis Labs: @LABRCNTIP (procalcitonin:4,lacticidven:4)  ) Recent Results (from the past 240 hour(s))  Surgical PCR screen     Status: None   Collection Time: 10/08/17  4:46 AM  Result Value Ref Range Status   MRSA, PCR NEGATIVE NEGATIVE Final   Staphylococcus aureus NEGATIVE NEGATIVE Final    Comment: (NOTE) The Xpert SA Assay (FDA approved for NASAL specimens in patients 8 years of age and older), is one component of a comprehensive surveillance program. It is not intended to diagnose infection nor to guide or monitor treatment. Performed at [facility], [place] [address], [place], [place] [address]       Radiology Studies: US Renal  Result Date: 10/11/2017 CLINICAL DATA:  Acute renal insufficiency. EXAM: RENAL / URINARY TRACT ULTRASOUND COMPLETE COMPARISON:  02/23/2013 FINDINGS: Right Kidney: Length: 9.7 cm. No hydronephrosis. Mild renal cortical thinning. Suspect increased echogenicity. Left Kidney: Length: 8.7 cm. No hydronephrosis. Mild renal cortical thinning. Suspect increased echogenicity. Bladder: Mildly irregular wall, likely related to a component of outlet obstruction. IMPRESSION: 1.  No hydronephrosis. 2. Mild renal cortical thinning and probable increased echogenicity. This suggests medical renal disease. Electronically Signed   By: Abigail Miyamoto M.D.   On: 10/11/2017 09:22     Scheduled Meds: . bupivacaine liposome  20 mL Infiltration Once  . docusate sodium  100 mg Oral BID  . isosorbide mononitrate  90 mg Oral Daily  . multivitamin with minerals  1 tablet Oral Daily  . simvastatin  20 mg Oral q1800   Continuous Infusions: . sodium chloride 10 mL/hr at 10/11/17 0942  . sodium chloride    . lactated ringers       LOS: 4 days   Time Spent in minutes   45 minutes  Mckensey Berghuis D.O. on 10/11/2017 at 12:39 PM  Between 7am to 7pm - Pager - [phone]  After 7pm go to [email or web address] - password  [email or web address]  And look for the night coverage person covering for me after hours  [facility] Office  [phone]

## 2017-10-12 ENCOUNTER — Inpatient Hospital Stay (HOSPITAL_COMMUNITY): Payer: Medicare Other

## 2017-10-12 DIAGNOSIS — M6281 Muscle weakness (generalized): Secondary | ICD-10-CM | POA: Diagnosis not present

## 2017-10-12 DIAGNOSIS — M1712 Unilateral primary osteoarthritis, left knee: Secondary | ICD-10-CM | POA: Diagnosis not present

## 2017-10-12 DIAGNOSIS — Z471 Aftercare following joint replacement surgery: Secondary | ICD-10-CM | POA: Diagnosis not present

## 2017-10-12 DIAGNOSIS — M1711 Unilateral primary osteoarthritis, right knee: Secondary | ICD-10-CM | POA: Diagnosis not present

## 2017-10-12 DIAGNOSIS — E785 Hyperlipidemia, unspecified: Secondary | ICD-10-CM | POA: Diagnosis not present

## 2017-10-12 DIAGNOSIS — R05 Cough: Secondary | ICD-10-CM

## 2017-10-12 DIAGNOSIS — D500 Iron deficiency anemia secondary to blood loss (chronic): Secondary | ICD-10-CM | POA: Diagnosis not present

## 2017-10-12 DIAGNOSIS — S79911A Unspecified injury of right hip, initial encounter: Secondary | ICD-10-CM | POA: Diagnosis not present

## 2017-10-12 DIAGNOSIS — R609 Edema, unspecified: Secondary | ICD-10-CM | POA: Diagnosis not present

## 2017-10-12 DIAGNOSIS — R278 Other lack of coordination: Secondary | ICD-10-CM | POA: Diagnosis not present

## 2017-10-12 DIAGNOSIS — R41841 Cognitive communication deficit: Secondary | ICD-10-CM | POA: Diagnosis not present

## 2017-10-12 DIAGNOSIS — I447 Left bundle-branch block, unspecified: Secondary | ICD-10-CM | POA: Diagnosis not present

## 2017-10-12 DIAGNOSIS — N179 Acute kidney failure, unspecified: Secondary | ICD-10-CM

## 2017-10-12 DIAGNOSIS — M25569 Pain in unspecified knee: Secondary | ICD-10-CM | POA: Diagnosis not present

## 2017-10-12 DIAGNOSIS — S72002A Fracture of unspecified part of neck of left femur, initial encounter for closed fracture: Secondary | ICD-10-CM | POA: Diagnosis not present

## 2017-10-12 DIAGNOSIS — I2510 Atherosclerotic heart disease of native coronary artery without angina pectoris: Secondary | ICD-10-CM | POA: Diagnosis not present

## 2017-10-12 DIAGNOSIS — Z9889 Other specified postprocedural states: Secondary | ICD-10-CM | POA: Diagnosis not present

## 2017-10-12 DIAGNOSIS — D649 Anemia, unspecified: Secondary | ICD-10-CM | POA: Diagnosis not present

## 2017-10-12 DIAGNOSIS — R488 Other symbolic dysfunctions: Secondary | ICD-10-CM | POA: Diagnosis not present

## 2017-10-12 DIAGNOSIS — I10 Essential (primary) hypertension: Secondary | ICD-10-CM | POA: Diagnosis not present

## 2017-10-12 DIAGNOSIS — N183 Chronic kidney disease, stage 3 (moderate): Secondary | ICD-10-CM | POA: Diagnosis not present

## 2017-10-12 DIAGNOSIS — I5030 Unspecified diastolic (congestive) heart failure: Secondary | ICD-10-CM | POA: Diagnosis not present

## 2017-10-12 DIAGNOSIS — R2689 Other abnormalities of gait and mobility: Secondary | ICD-10-CM | POA: Diagnosis not present

## 2017-10-12 DIAGNOSIS — Z950 Presence of cardiac pacemaker: Secondary | ICD-10-CM | POA: Diagnosis not present

## 2017-10-12 DIAGNOSIS — G8911 Acute pain due to trauma: Secondary | ICD-10-CM | POA: Diagnosis not present

## 2017-10-12 DIAGNOSIS — M25552 Pain in left hip: Secondary | ICD-10-CM | POA: Diagnosis not present

## 2017-10-12 DIAGNOSIS — Z96642 Presence of left artificial hip joint: Secondary | ICD-10-CM | POA: Diagnosis not present

## 2017-10-12 DIAGNOSIS — W19XXXA Unspecified fall, initial encounter: Secondary | ICD-10-CM | POA: Diagnosis not present

## 2017-10-12 DIAGNOSIS — I82409 Acute embolism and thrombosis of unspecified deep veins of unspecified lower extremity: Secondary | ICD-10-CM | POA: Diagnosis not present

## 2017-10-12 DIAGNOSIS — Z9181 History of falling: Secondary | ICD-10-CM | POA: Diagnosis not present

## 2017-10-12 DIAGNOSIS — I5032 Chronic diastolic (congestive) heart failure: Secondary | ICD-10-CM | POA: Diagnosis not present

## 2017-10-12 DIAGNOSIS — S79009S Unspecified physeal fracture of upper end of unspecified femur, sequela: Secondary | ICD-10-CM | POA: Diagnosis not present

## 2017-10-12 LAB — BASIC METABOLIC PANEL
Anion gap: 10 (ref 5–15)
BUN: 42 mg/dL — ABNORMAL HIGH (ref 6–20)
CO2: 29 mmol/L (ref 22–32)
Calcium: 8.1 mg/dL — ABNORMAL LOW (ref 8.9–10.3)
Chloride: 97 mmol/L — ABNORMAL LOW (ref 101–111)
Creatinine, Ser: 1.42 mg/dL — ABNORMAL HIGH (ref 0.44–1.00)
GFR calc Af Amer: 36 mL/min — ABNORMAL LOW (ref >60)
GFR calc non Af Amer: 31 mL/min — ABNORMAL LOW (ref >60)
Glucose, Bld: 90 mg/dL (ref 65–99)
Potassium: 4.2 mmol/L (ref 3.5–5.1)
Sodium: 136 mmol/L (ref 135–145)

## 2017-10-12 LAB — CBC
HCT: 27.8 % — ABNORMAL LOW (ref 36.0–46.0)
Hemoglobin: 8.8 g/dL — ABNORMAL LOW (ref 12.0–15.0)
MCH: 30.4 pg (ref 26.0–34.0)
MCHC: 31.7 g/dL (ref 30.0–36.0)
MCV: 96.2 fL (ref 78.0–100.0)
Platelets: 194 10*3/uL (ref 150–400)
RBC: 2.89 MIL/uL — ABNORMAL LOW (ref 3.87–5.11)
RDW: 16.5 % — ABNORMAL HIGH (ref 11.5–15.5)
WBC: 9.2 10*3/uL (ref 4.0–10.5)

## 2017-10-12 MED ORDER — FUROSEMIDE 40 MG PO TABS: 40.0000 mg | ORAL_TABLET | Freq: Every day | ORAL | 0 refills | Status: DC

## 2017-10-12 MED ORDER — ADULT MULTIVITAMIN W/MINERALS CH: 1.0000 | ORAL_TABLET | Freq: Every day | ORAL | Status: AC

## 2017-10-12 NOTE — Discharge Summary (Signed)
Physician Discharge Summary  [name] WPY:[id number] DOB: 19-Jul-1923 DOA: 10/06/2017  PCP: Gaynelle Arabian, MD  Admit date: 10/06/2017 Discharge date: 10/12/2017  Time spent: 45 minutes  Recommendations for Outpatient Follow-up:  Patient will be discharged to [facility].  Continue physical and occupational therapy. Patient will need to follow up with primary care provider within one week of discharge, repeat BMP and CBC.  Follow-up with orthopedic surgery in 2 weeks.  Patient should continue medications as prescribed.  Patient should follow a regular diet.   Discharge Diagnoses:  Left femoral neck fracture Acute blood loss anemia/Acute on chronic macrocytic anemia/Symptomatic anemia Hypotension Acute kidney injury on chronic kidney disease, stage III Fall Nonsustained V. Tach Chronic diastolic heart failure Coronary artery disease Hyperlipidemia Essential hypertension Hypokalemia Asymptomatic bacteriuria  Discharge Condition: Stable  Diet recommendation: regular  Filed Weights   10/08/17 1207  Weight: 79.4 kg (175 lb)    History of present illness:  on 10/07/2017 by Dr. Tyron Russell [facility] a 84 y.o.femalewith medical history significant ofhypertension, hyperlipidemia, GERD, pacemaker placement, MVR, left bundle blockage,dCHF, CAD,s/p ofstent placement, bilateral breast cancer (s/p ofbilateral mastectomy), pulmonary hypertension, GERD, CKD-3, who presents with fall and the left hip pain.  Pt states that she fell when she was walking toward TV using her walker and slipped. No LOC.She strongly denies any injury to head and the neck. She injured her left hip,anddevelopedleft hippain, which is constant, sharp, 3 out of 10 in severity, nonradiating. No leg numbness. Patient does not have unilateral weakness, numbness or tingling to extremities. No facial droop or slurred speech. Denies chest pain, shortness of breath, nausea, vomiting, diarrhea, abdominal pain,  symptoms of UTI.  Hospital Course:  Left femoral neck fracture -X-ray: Acute displaced left femoral neck fracture -Secondary to fall -Cardiology consulted for preoperative assessment- low to moderate risk -Orthopedic surgery consulted and appreciated- s/p left hip himiarthroplasty -Continue pain control -PT/OT rec SNF -Orthopedics recommending aspirin 325 mg twice daily for DVT prevention   Acute blood loss anemia/Acute on chronic macrocytic anemia/Symptomatic anemia -Possibly secondary to surgery versus delusional component -Baseline hemoglobin approximately 10 -Hemoglobin dropped to 6.4 on 10/10/2017 -patient tranfused 2uPRBCs -aspirin, plavix held-May resume on discharge -Patient no longer complaining of feeling weak or tired. -Hemoglobin today 8.8 -Repeat CBC in 1 week  Hypotension -patient currently anemic and received coreg and oral lasix today prior to transfusion -have discontinued coreg and lasix -BP better today, currently 107/56  Acute kidney injury on chronic kidney disease, stage III -Creatinine rose to 1.84, baseline approximately 1.1-1.3 -Suspect secondary to anemia and hypo-perfusion -Patient transfused 2 units PRBC -Obtained renal US, no hydronephrolosis, medical renal disease -Improved today, currently 1.42 -Repeat BMP in 1 week  Fall -Mechanical -Patient denied hitting her head, refused CT head and neck on admission -Patient states she does use a walker for ambulation -PT and OT consulted- recommended SNF -Social work consulted  Nonsustained V. Tach -Patient had 5 beat run of V. tach on 10/10/2017 -Last magnesium was 2.6 on 10/09/2017 -Potassium currently 4.3, magnesium 2.6 -Possibly secondary to blood loss.    Chronic diastolic heart failure -Echocardiogram 11/24/2016 showed EF of 05-39%, grade 1 diastolic dysfunction -Patient currently appears to be euvolemic and compensated.  She does have very trace amount of lower extremity edema however no JVD  or shortness of breath -BNP 161 -lasix held due to AKI and hypotension -Would continue to hold Lasix for an additional 2 days and restart on 10/14/2017  Coronary artery disease -Currently  denies any chest pain -Plavix held due to anemia -Continue Imdur, statin -coreg held-patient should follow-up with her primary care physician regarding restarting this medication  Hyperlipidemia -Continue statin  Essential hypertension -Blood pressure has been stable on no medications  Hypokalemia -Resolved, continue to monitor BMP  Asymptomatic bacteriuria -UA showed-large leukocytes, positive nitrites, rare bacteria, TNTC WBC -Patient denies any current pain or burning with urination -Currently afebrile with no leukocytosis -Will not treat with antibiotics  Consultants Cardiology Orthopedic surgery  Procedures  left hip himiarthroplasty  Code status: DNR  Discharge Exam: Vitals:   10/11/17 1959 10/12/17 0535  BP: (!) 120/49 (!) 107/56  Pulse: 68 65  Resp: 18 16  Temp: 98.3 F (36.8 C) 97.8 F (36.6 C)  SpO2: 100% 100%   Patient denies current pain. Denies chest pain, shortness of breath, abdominal pain, N/V/D/C. Had bowel movement today. Denies headache or dizziness.    General: Well developed, well nourished, NAD, appears stated age  14: NCAT, mucous membranes moist.  Neck: Supple  Cardiovascular: S1 S2 auscultated, RRR  Respiratory: Clear to auscultation bilaterally with equal chest rise  Abdomen: Soft, nontender, nondistended, + bowel sounds  Extremities: warm dry without cyanosis clubbing or edema  Neuro: AAOx3, hard of hearing and decreased vision, otherwise nonfocal  Psych: Normal affect and demeanor with intact judgement and insight, pleasant  Discharge Instructions Discharge Instructions    Discharge instructions   Complete by:  As directed    Patient will be discharged to [facility].  Continue physical and occupational therapy. Patient will  need to follow up with primary care provider within one week of discharge, repeat BMP and CBC.  Follow-up with orthopedic surgery in 2 weeks.  Patient should continue medications as prescribed.  Patient should follow a regular diet.     Allergies as of 10/12/2017      Reactions   Altace [ramipril] Cough   Celebrex [celecoxib] Swelling, Cough   Reported by Dr. Andrew Au office on 07/22/2016.       Medication List    STOP taking these medications   carvedilol 6.25 MG tablet Commonly known as:  COREG   ibuprofen 200 MG tablet Commonly known as:  ADVIL,MOTRIN     TAKE these medications   albuterol 108 (90 Base) MCG/ACT inhaler Commonly known as:  PROVENTIL HFA;VENTOLIN HFA Inhale 1-2 puffs into the lungs every 6 (six) hours as needed for wheezing or shortness of breath.   aspirin 325 MG EC tablet Take 1 tablet (325 mg total) by mouth daily with breakfast. What changed:    medication strength  how much to take  when to take this   clopidogrel 75 MG tablet Commonly known as:  PLAVIX TAKE ONE TABLET BY MOUTH ONCE DAILY   furosemide 40 MG tablet Commonly known as:  LASIX Take 1 tablet (40 mg total) by mouth daily. Restart on 10/14/2017 What changed:  additional instructions   HYDROcodone-acetaminophen 5-325 MG tablet Commonly known as:  NORCO/VICODIN Take 1-2 tablets by mouth every 6 (six) hours as needed for moderate pain.   isosorbide mononitrate 60 MG 24 hr tablet Commonly known as:  IMDUR TAKE ONE AND ONE-HALF TABLETS DAILY   multivitamin with minerals Tabs tablet Take 1 tablet by mouth daily. Start taking on:  10/13/2017   nitroGLYCERIN 0.4 MG SL tablet Commonly known as:  NITROSTAT Place 1 tablet (0.4 mg total) every 5 (five) minutes as needed under the tongue for chest pain.   simvastatin 20 MG tablet Commonly known as:  ZOCOR TAKE ONE TABLET EACH DAY AT 6PM   Vitamin D-3 5000 units Tabs Take 5,000 Units by mouth daily.      Allergies  Allergen  Reactions  . Altace [Ramipril] Cough  . Celebrex [Celecoxib] Swelling and Cough    Reported by Dr. Andrew Au office on 07/22/2016.     Contact information for follow-up providers    Gaynelle Arabian, MD. Schedule an appointment as soon as possible for a visit in 1 week(s).   Specialty:  Family Medicine Why:  Hospital follow up Contact information: [address], [place] [address] [phone]        Sueanne Margarita, MD Follow up.   Specialty:  Cardiology Contact information: [address] [place] [address] [phone]        Melrose Nakayama, MD. Schedule an appointment as soon as possible for a visit in 2 week(s).   Specialty:  Orthopedic Surgery Why:  Hospital follow up Contact information: [address] [place] [address] [phone]            Contact information for after-discharge care    Destination    [facility] Follow up.   Service:  Skilled Nursing Contact information: [address] [place] [phone]                   The results of significant diagnostics from this hospitalization (including imaging, microbiology, ancillary and laboratory) are listed below for reference.    Significant Diagnostic Studies: Dg Chest 1 View  Result Date: 10/06/2017 CLINICAL DATA:  Preop, hip fracture EXAM: CHEST 1 VIEW COMPARISON:  07/11/2015 FINDINGS: Surgical clips in the right axilla. Left-sided pacing device as before. Small left-sided pleural effusion with left basilar atelectasis or pneumonia. Borderline heart size. Aortic atherosclerosis. No pneumothorax. IMPRESSION: Small left pleural effusion with left basilar atelectasis or pneumonia. Electronically Signed   By: Donavan Foil M.D.   On: 10/06/2017 23:43   US Renal  Result Date: 10/11/2017 CLINICAL DATA:  Acute renal insufficiency. EXAM: RENAL / URINARY TRACT ULTRASOUND COMPLETE COMPARISON:  02/23/2013 FINDINGS: Right  Kidney: Length: 9.7 cm. No hydronephrosis. Mild renal cortical thinning. Suspect increased echogenicity. Left Kidney: Length: 8.7 cm. No hydronephrosis. Mild renal cortical thinning. Suspect increased echogenicity. Bladder: Mildly irregular wall, likely related to a component of outlet obstruction. IMPRESSION: 1.  No hydronephrosis. 2. Mild renal cortical thinning and probable increased echogenicity. This suggests medical renal disease. Electronically Signed   By: Abigail Miyamoto M.D.   On: 10/11/2017 09:22   Pelvis Portable  Result Date: 10/08/2017 CLINICAL DATA:  Postop left hip hemiarthroplasty. EXAM: PORTABLE PELVIS 1-2 VIEWS COMPARISON:  10/17/2015 FINDINGS: Subcutaneous emphysema is seen about the left hip status post left bipolar uncemented hip arthroplasty. No immediate postoperative complication is noted. No joint dislocation or fracture is apparent for the single frontal view. Pre-existing right total hip arthroplasty is identified without evidence of loosening or hardware failure. Included pelvis appears intact. IMPRESSION: New left bipolar hip arthroplasty with postop soft tissue change. No immediate complications noted. Electronically Signed   By: Ashley Royalty M.D.   On: 10/08/2017 18:38   Dg Chest Port 1 View  Result Date: 10/12/2017 CLINICAL DATA:  Cough EXAM: PORTABLE CHEST 1 VIEW COMPARISON:  October 06, 2017 FINDINGS: There is atelectatic change in the left base with small left pleural effusion. Lungs elsewhere clear. Heart is upper normal in size with pulmonary vascularity within normal limits. Pacemaker leads are attached  to the right atrium and right ventricle, stable. There is aortic atherosclerosis. No adenopathy. There is postoperative change in the axillary regions bilaterally. No bone lesions evident. IMPRESSION: Atelectatic change left base with small left pleural effusion. Lungs elsewhere clear. Stable cardiac silhouette. There is aortic atherosclerosis. Aortic Atherosclerosis  (ICD10-I70.0). Electronically Signed   By: Lowella Grip III M.D.   On: 10/12/2017 10:43   Dg Hip Unilat W Or Wo Pelvis 2-3 Views Left  Result Date: 10/06/2017 CLINICAL DATA:  Fall with left hip pain EXAM: DG HIP (WITH OR WITHOUT PELVIS) 2-3V LEFT COMPARISON:  10/17/2015 FINDINGS: Status post right hip replacement with normal alignment. Acute displaced left femoral neck fracture with cephalad migration of the trochanter. Low lying bowel loop in the right pelvis raises possibility for inguinal hernia IMPRESSION: 1. Acute displaced left femoral neck fracture 2. Status post right hip replacement with normal alignment 3. Low lying right lower pelvic bowel loops, possible inguinal hernia. Electronically Signed   By: Donavan Foil M.D.   On: 10/06/2017 22:45    Microbiology: Recent Results (from the past 240 hour(s))  Surgical PCR screen     Status: None   Collection Time: 10/08/17  4:46 AM  Result Value Ref Range Status   MRSA, PCR NEGATIVE NEGATIVE Final   Staphylococcus aureus NEGATIVE NEGATIVE Final    Comment: (NOTE) The Xpert SA Assay (FDA approved for NASAL specimens in patients 44 years of age and older), is one component of a comprehensive surveillance program. It is not intended to diagnose infection nor to guide or monitor treatment. Performed at [facility], [place] [address], [place], [place] [address]      Labs: Basic Metabolic Panel: Recent Labs  Lab 10/08/17 0524 10/09/17 0516 10/10/17 0355 10/11/17 0618 10/12/17 0601  NA 143 142 137 136 136  K 3.4* 4.2 4.7 4.3 4.2  CL 103 104 100* 99* 97*  CO2 30 29 26 28 29   GLUCOSE 105* 103* 107* 95 90  BUN 21* 25* 32* 43* 42*  CREATININE 1.16* 1.30* 1.84* 1.80* 1.42*  CALCIUM 8.7* 8.2* 7.9* 8.0* 8.1*  MG  --  2.6*  --  2.6*  --    Liver Function Tests: Recent Labs  Lab 10/06/17 2320  AST 19  ALT 14  ALKPHOS 97  BILITOT 0.8  PROT 6.1*  ALBUMIN 3.4*   No results for input(s): LIPASE, AMYLASE in the last 168  hours. No results for input(s): AMMONIA in the last 168 hours. CBC: Recent Labs  Lab 10/06/17 2320  10/08/17 0524 10/09/17 0516 10/09/17 1254 10/10/17 0355 10/10/17 1823 10/11/17 0618 10/12/17 0601  WBC 9.4   < > 8.7 10.5  --  10.0  --  8.9 9.2  NEUTROABS 7.7  --   --   --   --   --   --   --   --   HGB 10.4*   < > 10.0* 7.9* 8.1* 6.4* 8.4* 8.4* 8.8*  HCT 33.6*   < > 32.1* 25.3* 26.1* 20.2* 26.1* 25.5* 27.8*  MCV 101.5*   < > 103.2* 102.8*  --  100.5*  --  95.5 96.2  PLT 191   < > 156 141*  --  154  --  150 194   < > = values in this interval not displayed.   Cardiac Enzymes: No results for input(s): CKTOTAL, CKMB, CKMBINDEX, TROPONINI in the last 168 hours. BNP: BNP (last 3 results) Recent Labs    10/06/17 2320  BNP 161.0*    ProBNP (last 3 results) No results for input(s): PROBNP in the last 8760 hours.  CBG: Recent Labs  Lab 10/08/17 0606  GLUCAP 115*       Signed:  Braven Wolk  [facility] 10/12/2017, 11:26 AM

## 2017-10-12 NOTE — Care Management Important Message (Signed)
Important Message  Patient Details  Name: [name] MRN: [id number] Date of Birth: 09-14-1922   Medicare Important Message Given:  Yes    Ridley Schewe 10/12/2017, 1:15 PM

## 2017-10-12 NOTE — Progress Notes (Signed)
Pt discharged to [facility] via [facility] with discharge packet.  [name]

## 2017-10-12 NOTE — Progress Notes (Signed)
[facility] here to transport pt.  Report called to [place], LPN at [facility].  [name]

## 2017-10-12 NOTE — Clinical Social Work Placement (Signed)
   CLINICAL SOCIAL WORK PLACEMENT  NOTE  Date:  10/12/2017  Patient Details  Name: [name] MRN: [id number] Date of Birth: 06-29-1923  Clinical Social Work is seeking post-discharge placement for this patient at the [place] level of care (*CSW will initial, date and re-position this form in  chart as items are completed):  Yes   Patient/family provided with [place] Work Department's list of facilities offering this level of care within the geographic area requested by the patient (or if unable, by the patient's family).  Yes   Patient/family informed of their freedom to choose among providers that offer the needed level of care, that participate in Medicare, Medicaid or managed care program needed by the patient, have an available bed and are willing to accept the patient.  Yes   Patient/family informed of [facility]'s ownership interest in [facility] and [facility], as well as of the fact that they are under no obligation to receive care at these facilities.  PASRR submitted to EDS on       PASRR number received on       Existing PASRR number confirmed on 10/08/17     FL2 transmitted to all facilities in geographic area requested by pt/family on       FL2 transmitted to all facilities within larger geographic area on 10/09/17     Patient informed that his/her managed care company has contracts with or will negotiate with certain facilities, including the following:        Yes   Patient/family informed of bed offers received.  Patient chooses bed at [facility]     Physician recommends and patient chooses bed at      Patient to be transferred to [facility] on 10/12/17.  Patient to be transferred to facility by [facility]     Patient family notified on 10/12/17 of transfer.  Name of family member notified:  daughter at bedside     PHYSICIAN       Additional Comment:     _______________________________________________ Normajean Baxter, LCSW 10/12/2017, 11:32 AM

## 2017-10-12 NOTE — Progress Notes (Signed)
Subjective: 4 Days Post-Op Procedure(s) (LRB): LEFT HIP HIMIARTHROPLASTY (Left)   Patient resting comfortably in bed. She has a bed waiting for her at [facility] place once she is medically stable to leave.  Activity level:  wbat with posterior hip precautions. Diet tolerance:  ok Voiding:  ok Patient reports pain as mild.    Objective: Vital signs in last 24 hours: Temp:  [97.8 F (36.6 C)-98.3 F (36.8 C)] 97.8 F (36.6 C) (03/11 0535) Pulse Rate:  [65-68] 65 (03/11 0535) Resp:  [16-18] 16 (03/11 0535) BP: (107-120)/(44-56) 107/56 (03/11 0535) SpO2:  [94 %-100 %] 100 % (03/11 0535)  Labs: Recent Labs    10/09/17 1254 10/10/17 0355 10/10/17 1823 10/11/17 0618 10/12/17 0601  HGB 8.1* 6.4* 8.4* 8.4* 8.8*   Recent Labs    10/11/17 0618 10/12/17 0601  WBC 8.9 9.2  RBC 2.67* 2.89*  HCT 25.5* 27.8*  PLT 150 194   Recent Labs    10/11/17 0618 10/12/17 0601  NA 136 136  K 4.3 4.2  CL 99* 97*  CO2 28 29  BUN 43* 42*  CREATININE 1.80* 1.42*  GLUCOSE 95 90  CALCIUM 8.0* 8.1*   No results for input(s): LABPT, INR in the last 72 hours.  Physical Exam:  Neurologically intact ABD soft Neurovascular intact Sensation intact distally Intact pulses distally Dorsiflexion/Plantar flexion intact Incision: dressing C/D/I and scant drainage No cellulitis present Compartment soft  Assessment/Plan:  4 Days Post-Op Procedure(s) (LRB): LEFT HIP HIMIARTHROPLASTY (Left) Advance diet Up with therapy Discharge to SNF once cleared by PT and medicine team. We greatly appreciate medical management. From an orthopaedic standpoint she is cleared to go to SNF. Follow up in office 2 weeks post op. Continue on ASA 325mg  BID for DVT prevention  Cass Edinger, Larwance Sachs 10/12/2017, 9:36 AM

## 2017-10-12 NOTE — Social Work (Signed)
Clinical Social Worker facilitated patient discharge including contacting patient family and facility to confirm patient discharge plans.  Clinical information faxed to facility and family agreeable with plan.    CSW arranged ambulance transport via [facility] to [facility] at 2:00pm .    RN to call [phone] to give report prior to discharge. Pt going to Room 604P.  Clinical Social Worker will sign off for now as social work intervention is no longer needed. Please consult [place] again if new need arises.  Elissa Hefty, LCSW Clinical Social Worker [phone]

## 2017-10-13 DIAGNOSIS — D649 Anemia, unspecified: Secondary | ICD-10-CM | POA: Diagnosis not present

## 2017-10-13 DIAGNOSIS — S72002A Fracture of unspecified part of neck of left femur, initial encounter for closed fracture: Secondary | ICD-10-CM | POA: Diagnosis not present

## 2017-10-13 DIAGNOSIS — M25552 Pain in left hip: Secondary | ICD-10-CM | POA: Diagnosis not present

## 2017-10-13 DIAGNOSIS — I5030 Unspecified diastolic (congestive) heart failure: Secondary | ICD-10-CM | POA: Diagnosis not present

## 2017-10-13 DIAGNOSIS — Z9889 Other specified postprocedural states: Secondary | ICD-10-CM | POA: Diagnosis not present

## 2017-10-13 DIAGNOSIS — Z9181 History of falling: Secondary | ICD-10-CM | POA: Diagnosis not present

## 2017-10-13 DIAGNOSIS — R2689 Other abnormalities of gait and mobility: Secondary | ICD-10-CM | POA: Diagnosis not present

## 2017-10-13 DIAGNOSIS — S79009S Unspecified physeal fracture of upper end of unspecified femur, sequela: Secondary | ICD-10-CM | POA: Diagnosis not present

## 2017-10-14 DIAGNOSIS — Z9181 History of falling: Secondary | ICD-10-CM | POA: Diagnosis not present

## 2017-10-14 DIAGNOSIS — S79009S Unspecified physeal fracture of upper end of unspecified femur, sequela: Secondary | ICD-10-CM | POA: Diagnosis not present

## 2017-10-14 DIAGNOSIS — M25552 Pain in left hip: Secondary | ICD-10-CM | POA: Diagnosis not present

## 2017-10-14 DIAGNOSIS — R2689 Other abnormalities of gait and mobility: Secondary | ICD-10-CM | POA: Diagnosis not present

## 2017-10-16 DIAGNOSIS — S79009S Unspecified physeal fracture of upper end of unspecified femur, sequela: Secondary | ICD-10-CM | POA: Diagnosis not present

## 2017-10-16 DIAGNOSIS — M25552 Pain in left hip: Secondary | ICD-10-CM | POA: Diagnosis not present

## 2017-10-16 DIAGNOSIS — Z9181 History of falling: Secondary | ICD-10-CM | POA: Diagnosis not present

## 2017-10-16 DIAGNOSIS — R2689 Other abnormalities of gait and mobility: Secondary | ICD-10-CM | POA: Diagnosis not present

## 2017-10-20 DIAGNOSIS — S72002A Fracture of unspecified part of neck of left femur, initial encounter for closed fracture: Secondary | ICD-10-CM | POA: Diagnosis not present

## 2017-10-20 DIAGNOSIS — D649 Anemia, unspecified: Secondary | ICD-10-CM | POA: Diagnosis not present

## 2017-10-20 DIAGNOSIS — Z9889 Other specified postprocedural states: Secondary | ICD-10-CM | POA: Diagnosis not present

## 2017-10-21 DIAGNOSIS — R2689 Other abnormalities of gait and mobility: Secondary | ICD-10-CM | POA: Diagnosis not present

## 2017-10-21 DIAGNOSIS — M25552 Pain in left hip: Secondary | ICD-10-CM | POA: Diagnosis not present

## 2017-10-21 DIAGNOSIS — Z9181 History of falling: Secondary | ICD-10-CM | POA: Diagnosis not present

## 2017-10-21 DIAGNOSIS — Z471 Aftercare following joint replacement surgery: Secondary | ICD-10-CM | POA: Diagnosis not present

## 2017-10-21 DIAGNOSIS — Z96642 Presence of left artificial hip joint: Secondary | ICD-10-CM | POA: Diagnosis not present

## 2017-10-21 DIAGNOSIS — S79009S Unspecified physeal fracture of upper end of unspecified femur, sequela: Secondary | ICD-10-CM | POA: Diagnosis not present

## 2017-10-26 DIAGNOSIS — I82409 Acute embolism and thrombosis of unspecified deep veins of unspecified lower extremity: Secondary | ICD-10-CM | POA: Diagnosis not present

## 2017-10-26 DIAGNOSIS — Z9889 Other specified postprocedural states: Secondary | ICD-10-CM | POA: Diagnosis not present

## 2017-10-27 DIAGNOSIS — I82409 Acute embolism and thrombosis of unspecified deep veins of unspecified lower extremity: Secondary | ICD-10-CM | POA: Diagnosis not present

## 2017-10-27 DIAGNOSIS — I5030 Unspecified diastolic (congestive) heart failure: Secondary | ICD-10-CM | POA: Diagnosis not present

## 2017-10-27 DIAGNOSIS — M25552 Pain in left hip: Secondary | ICD-10-CM | POA: Diagnosis not present

## 2017-10-27 DIAGNOSIS — Z9181 History of falling: Secondary | ICD-10-CM | POA: Diagnosis not present

## 2017-10-27 DIAGNOSIS — S79009S Unspecified physeal fracture of upper end of unspecified femur, sequela: Secondary | ICD-10-CM | POA: Diagnosis not present

## 2017-10-27 DIAGNOSIS — Z9889 Other specified postprocedural states: Secondary | ICD-10-CM | POA: Diagnosis not present

## 2017-10-27 DIAGNOSIS — R2689 Other abnormalities of gait and mobility: Secondary | ICD-10-CM | POA: Diagnosis not present

## 2017-10-28 NOTE — Addendum Note (Signed)
Addendum  created 10/28/17 0730 by Belinda Block, MD   Intraprocedure Staff edited

## 2017-10-29 DIAGNOSIS — D649 Anemia, unspecified: Secondary | ICD-10-CM | POA: Diagnosis not present

## 2017-10-29 DIAGNOSIS — S72002A Fracture of unspecified part of neck of left femur, initial encounter for closed fracture: Secondary | ICD-10-CM | POA: Diagnosis not present

## 2017-10-29 DIAGNOSIS — I82409 Acute embolism and thrombosis of unspecified deep veins of unspecified lower extremity: Secondary | ICD-10-CM | POA: Diagnosis not present

## 2017-11-02 DIAGNOSIS — D649 Anemia, unspecified: Secondary | ICD-10-CM | POA: Diagnosis not present

## 2017-11-02 DIAGNOSIS — I82409 Acute embolism and thrombosis of unspecified deep veins of unspecified lower extremity: Secondary | ICD-10-CM | POA: Diagnosis not present

## 2017-11-02 DIAGNOSIS — Z9889 Other specified postprocedural states: Secondary | ICD-10-CM | POA: Diagnosis not present

## 2017-11-04 DIAGNOSIS — M25552 Pain in left hip: Secondary | ICD-10-CM | POA: Diagnosis not present

## 2017-11-04 DIAGNOSIS — I82409 Acute embolism and thrombosis of unspecified deep veins of unspecified lower extremity: Secondary | ICD-10-CM | POA: Diagnosis not present

## 2017-11-04 DIAGNOSIS — M1712 Unilateral primary osteoarthritis, left knee: Secondary | ICD-10-CM | POA: Diagnosis not present

## 2017-11-04 DIAGNOSIS — M25569 Pain in unspecified knee: Secondary | ICD-10-CM | POA: Diagnosis not present

## 2017-11-04 DIAGNOSIS — D649 Anemia, unspecified: Secondary | ICD-10-CM | POA: Diagnosis not present

## 2017-11-04 DIAGNOSIS — M1711 Unilateral primary osteoarthritis, right knee: Secondary | ICD-10-CM | POA: Diagnosis not present

## 2017-11-06 DIAGNOSIS — I82409 Acute embolism and thrombosis of unspecified deep veins of unspecified lower extremity: Secondary | ICD-10-CM | POA: Diagnosis not present

## 2017-11-06 DIAGNOSIS — R609 Edema, unspecified: Secondary | ICD-10-CM | POA: Diagnosis not present

## 2017-11-06 DIAGNOSIS — M25569 Pain in unspecified knee: Secondary | ICD-10-CM | POA: Diagnosis not present

## 2017-11-06 DIAGNOSIS — D649 Anemia, unspecified: Secondary | ICD-10-CM | POA: Diagnosis not present

## 2017-11-10 DIAGNOSIS — S79009S Unspecified physeal fracture of upper end of unspecified femur, sequela: Secondary | ICD-10-CM | POA: Diagnosis not present

## 2017-11-10 DIAGNOSIS — R2689 Other abnormalities of gait and mobility: Secondary | ICD-10-CM | POA: Diagnosis not present

## 2017-11-10 DIAGNOSIS — Z9181 History of falling: Secondary | ICD-10-CM | POA: Diagnosis not present

## 2017-11-10 DIAGNOSIS — M25552 Pain in left hip: Secondary | ICD-10-CM | POA: Diagnosis not present

## 2017-11-12 DIAGNOSIS — M25552 Pain in left hip: Secondary | ICD-10-CM | POA: Diagnosis not present

## 2017-11-12 DIAGNOSIS — I5030 Unspecified diastolic (congestive) heart failure: Secondary | ICD-10-CM | POA: Diagnosis not present

## 2017-11-12 DIAGNOSIS — R2689 Other abnormalities of gait and mobility: Secondary | ICD-10-CM | POA: Diagnosis not present

## 2017-11-12 DIAGNOSIS — D649 Anemia, unspecified: Secondary | ICD-10-CM | POA: Diagnosis not present

## 2017-11-12 DIAGNOSIS — Z9889 Other specified postprocedural states: Secondary | ICD-10-CM | POA: Diagnosis not present

## 2017-11-12 DIAGNOSIS — S72002A Fracture of unspecified part of neck of left femur, initial encounter for closed fracture: Secondary | ICD-10-CM | POA: Diagnosis not present

## 2017-11-12 DIAGNOSIS — S79009S Unspecified physeal fracture of upper end of unspecified femur, sequela: Secondary | ICD-10-CM | POA: Diagnosis not present

## 2017-11-12 DIAGNOSIS — Z9181 History of falling: Secondary | ICD-10-CM | POA: Diagnosis not present

## 2017-11-18 DIAGNOSIS — S72002A Fracture of unspecified part of neck of left femur, initial encounter for closed fracture: Secondary | ICD-10-CM | POA: Diagnosis not present

## 2017-11-18 DIAGNOSIS — D649 Anemia, unspecified: Secondary | ICD-10-CM | POA: Diagnosis not present

## 2017-11-18 DIAGNOSIS — I5030 Unspecified diastolic (congestive) heart failure: Secondary | ICD-10-CM | POA: Diagnosis not present

## 2017-11-18 DIAGNOSIS — I10 Essential (primary) hypertension: Secondary | ICD-10-CM | POA: Diagnosis not present

## 2017-11-23 DIAGNOSIS — I808 Phlebitis and thrombophlebitis of other sites: Secondary | ICD-10-CM | POA: Diagnosis not present

## 2017-11-23 DIAGNOSIS — W19XXXA Unspecified fall, initial encounter: Secondary | ICD-10-CM | POA: Diagnosis not present

## 2017-11-23 DIAGNOSIS — M179 Osteoarthritis of knee, unspecified: Secondary | ICD-10-CM | POA: Diagnosis not present

## 2017-11-23 DIAGNOSIS — R0600 Dyspnea, unspecified: Secondary | ICD-10-CM | POA: Diagnosis not present

## 2017-11-23 DIAGNOSIS — E538 Deficiency of other specified B group vitamins: Secondary | ICD-10-CM | POA: Diagnosis not present

## 2017-11-23 DIAGNOSIS — S72002A Fracture of unspecified part of neck of left femur, initial encounter for closed fracture: Secondary | ICD-10-CM | POA: Diagnosis not present

## 2017-11-23 DIAGNOSIS — N183 Chronic kidney disease, stage 3 (moderate): Secondary | ICD-10-CM | POA: Diagnosis not present

## 2017-11-23 DIAGNOSIS — I255 Ischemic cardiomyopathy: Secondary | ICD-10-CM | POA: Diagnosis not present

## 2017-11-24 ENCOUNTER — Ambulatory Visit (INDEPENDENT_AMBULATORY_CARE_PROVIDER_SITE_OTHER): Payer: Medicare Other | Admitting: Internal Medicine

## 2017-11-24 ENCOUNTER — Encounter: Payer: Self-pay | Admitting: Internal Medicine

## 2017-11-24 VITALS — BP 132/66 | HR 77 | Ht 64.0 in | Wt 179.0 lb

## 2017-11-24 DIAGNOSIS — R262 Difficulty in walking, not elsewhere classified: Secondary | ICD-10-CM | POA: Diagnosis not present

## 2017-11-24 DIAGNOSIS — I447 Left bundle-branch block, unspecified: Secondary | ICD-10-CM

## 2017-11-24 DIAGNOSIS — M6281 Muscle weakness (generalized): Secondary | ICD-10-CM | POA: Diagnosis not present

## 2017-11-24 DIAGNOSIS — I5032 Chronic diastolic (congestive) heart failure: Secondary | ICD-10-CM

## 2017-11-24 DIAGNOSIS — I420 Dilated cardiomyopathy: Secondary | ICD-10-CM

## 2017-11-24 DIAGNOSIS — Z950 Presence of cardiac pacemaker: Secondary | ICD-10-CM | POA: Diagnosis not present

## 2017-11-24 DIAGNOSIS — R278 Other lack of coordination: Secondary | ICD-10-CM | POA: Diagnosis not present

## 2017-11-24 DIAGNOSIS — R2681 Unsteadiness on feet: Secondary | ICD-10-CM | POA: Diagnosis not present

## 2017-11-24 DIAGNOSIS — R2689 Other abnormalities of gait and mobility: Secondary | ICD-10-CM | POA: Diagnosis not present

## 2017-11-24 DIAGNOSIS — Z9181 History of falling: Secondary | ICD-10-CM | POA: Diagnosis not present

## 2017-11-24 NOTE — Patient Instructions (Addendum)
Medication Instructions:  Your physician has recommended you make the following change in your medication:  ~Starting 5/26~  Take 80mg  Lasix, 2 tablets, every other day until June 4th Alternating with 40mg  Lasix, every other day until June 4th Starting June 5, begin your normal dose of Lasix, 40mg  tablet, once daily.   Labwork: Your physician recommends that you return for lab work on Dec 31, 2017.   Testing/Procedures: None ordered.  Follow-Up: Your physician wants you to follow-up in: 6 months with the device clinic and One Year with Dr Caryl Comes. You will receive a reminder letter in the mail two months in advance. If you don't receive a letter, please call our office to schedule the follow-up appointment.    Any Other Special Instructions Will Be Listed Below (If Applicable).     If you need a refill on your cardiac medications before your next appointment, please call your pharmacy.

## 2017-11-24 NOTE — Progress Notes (Signed)
Patient Care Team: Gaynelle Arabian, MD as PCP - General (Family Medicine) Sueanne Margarita, MD as PCP - Cardiology (Cardiology)   HPI  [name] is a 84 y.o. female Seen in followup for congestive heart failure in the setting of ischemic and ischemic heart disease status post CRT P . Implantation complicated by lead dislodgment X3  She thinks that she is functionally improved since the CRT implantation.    Interval echocardiogram 9/15 demonstrated normalization of LV systolic function with mild left atrial dilatation  Myoview 4 /17 retained normalization of LV function and without ischemia  She fell and broke her hip trying to get to the TV so she could watch the [employer].  She is a huge [employer]. Just kidding   Since then she has been gradually regaining stability.  She has chronic lower extremity edema.  She has chronic shortness of breath  Postoperatively she got a left upper extremity blood clot prompting initiation of anticoagulation  Date Cr Hgb  12/17 1.11 10.6  3/19 1.42  8.8         DATE TEST EF    4/18 Echo   55-60  %           Past Medical History:  Diagnosis Date  . Breast cancer ([place])    bilaterally  . CAD (coronary artery disease)    w/ DES to LAD with residual diagonal disease to small for PCI 6/11 and repeat DES to LAD for restenosis 3/12  . Cardiomyopathy ([place])    Mixed CM; s/p St. Jude CRT-P implant 10/24 w/ LV lead revision 10/25 . Echo 08/2015 with EF 45%.    . Chronic systolic CHF (congestive heart failure) ([place])   . Dyslipidemia, goal LDL below 70   . GERD (gastroesophageal reflux disease)    occ  . Hematuria 02/23/2013   w/clots required hospital admission on 02/23/2013  . Hypertension   . LBBB (left bundle branch block)   . Macular degeneration   . Mild mitral regurgitation   . Osteoarthritis    "right knee" (02/23/2013)  . Osteopenia   . Pacemaker   . Pulmonary HTN ([place])    PASP 96mmHg by echo 11/2016  . Umbilical hernia    Repaired on 01/10/2013   . Vitamin B12 deficiency     Past Surgical History:  Procedure Laterality Date  . BI-VENTRICULAR PACEMAKER INSERTION N/A 05/27/2012   Procedure: BI-VENTRICULAR PACEMAKER INSERTION (CRT-P);  Surgeon: Deboraha Sprang, MD;  [facility];  Service: Cardiovascular;  Laterality: N/A;  . BREAST BIOPSY  1995   bilaterally  . CATARACT EXTRACTION W/ INTRAOCULAR LENS  IMPLANT, BILATERAL  1990's  . CORONARY ANGIOPLASTY WITH STENT PLACEMENT  2010   "1" (05/27/2012)  . HERNIA REPAIR    . HIP ARTHROPLASTY Left 10/08/2017   Procedure: LEFT HIP HIMIARTHROPLASTY;  Surgeon: Melrose Nakayama, MD;  Location: [place];  Service: Orthopedics;  Laterality: Left;  . INSERT / REPLACE / REMOVE PACEMAKER  05/27/2012   initial placement - BiV PPM  . INSERT / REPLACE / REMOVE PACEMAKER  05/31/2012   "lead change/repaired" (05/31/2012)  . INSERTION OF MESH N/A 01/10/2013   Procedure: INSERTION OF MESH;  Surgeon: Haywood Lasso, MD;  Location: [place];  Service: General;  Laterality: N/A;  . JOINT REPLACEMENT    . LEAD REVISION N/A 05/28/2012   Procedure: LEAD REVISION;  Surgeon: Evans Lance, MD;  Location: [facility] CATH LAB;  Service: Cardiovascular;  Laterality: N/A;  .  LEAD REVISION Left 05/31/2012   Procedure: LEAD REVISION;  Surgeon: Deboraha Sprang, MD;  Location: [facility] CATH LAB;  Service: Cardiovascular;  Laterality: Left;  Marland Kitchen MASTECTOMY  1995   bilaterally  . TONSILLECTOMY    . TOTAL HIP ARTHROPLASTY  2011   right  . UMBILICAL HERNIA REPAIR N/A 01/10/2013   Procedure: HERNIA REPAIR UMBILICAL ADULT;  Surgeon: Haywood Lasso, MD;  Location: [place];  Service: General;  Laterality: N/A;    Current Outpatient Medications  Medication Sig Dispense Refill  . albuterol (PROVENTIL HFA;VENTOLIN HFA) 108 (90 Base) MCG/ACT inhaler Inhale 1-2 puffs into the lungs every 6 (six) hours as needed for wheezing or shortness of breath.    . Apixaban (ELIQUIS PO) Take 1 tablet by mouth 2 (two) times  daily.    . Cholecalciferol (VITAMIN D-3) 5000 UNITS TABS Take 5,000 Units by mouth daily.     . furosemide (LASIX) 40 MG tablet Take 1 tablet (40 mg total) by mouth daily. Restart on 10/14/2017 30 tablet 0  . isosorbide mononitrate (IMDUR) 60 MG 24 hr tablet TAKE ONE AND ONE-HALF TABLETS DAILY 135 tablet 2  . Multiple Vitamin (MULTIVITAMIN WITH MINERALS) TABS tablet Take 1 tablet by mouth daily.    . nitroGLYCERIN (NITROSTAT) 0.4 MG SL tablet Place 1 tablet (0.4 mg total) every 5 (five) minutes as needed under the tongue for chest pain. 25 tablet 3  . simvastatin (ZOCOR) 20 MG tablet TAKE ONE TABLET EACH DAY AT 6PM 90 tablet 2   No current facility-administered medications for this visit.     Allergies  Allergen Reactions  . Altace [Ramipril] Cough  . Celebrex [Celecoxib] Swelling and Cough    Reported by Dr. Andrew Au office on 07/22/2016.     Review of Systems negative except from HPI and PMH  Physical Exam BP 132/66   Pulse 77   Ht 5\' 4"  (1.626 m)   Wt 179 lb (81.2 kg)   BMI 30.73 kg/m  Well developed and nourished in no acute distress HENT normal Neck supple with JVP-flat Clear Regular rate and rhythm, no murmurs or gallops Abd-soft with active BS No Clubbing cyanosis 2-3+ edema Skin-warm and dry A & Oriented  Grossly normal sensory and motor function  ECG personally reviewed P synchronous pacing with an upright QRS lead V1 and a QRS in lead I   Assessment and  Plan  Ischemic and nonischemic heart disease  Cardiomyopathy-resolved  Complete heart block  Syncope  CRT P  The patient's device was interrogated.  The information was reviewed. No changes were made in the programming.      Chronic edema  DOE    HFpEF    No interval syncope.  Lower extremity swelling improve ambulation.  We will need to reassess her renal function, but I wonder if it is not in her favor to try to diurese her modestly aggressively to improve her gait and reduce hopefully  thereby the risk of falling  In addition to diuretics, I wonder whether her leg wraps and or rollers for nocturnal compression (like used for lymphedema) might b so doing very really stronge of benefit.  We spent more than 50% of our >25 min visit in face to face counseling regarding the above

## 2017-11-25 DIAGNOSIS — M6281 Muscle weakness (generalized): Secondary | ICD-10-CM | POA: Diagnosis not present

## 2017-11-25 DIAGNOSIS — Z9181 History of falling: Secondary | ICD-10-CM | POA: Diagnosis not present

## 2017-11-25 DIAGNOSIS — R2689 Other abnormalities of gait and mobility: Secondary | ICD-10-CM | POA: Diagnosis not present

## 2017-11-25 DIAGNOSIS — R278 Other lack of coordination: Secondary | ICD-10-CM | POA: Diagnosis not present

## 2017-11-25 DIAGNOSIS — R262 Difficulty in walking, not elsewhere classified: Secondary | ICD-10-CM | POA: Diagnosis not present

## 2017-11-25 DIAGNOSIS — R2681 Unsteadiness on feet: Secondary | ICD-10-CM | POA: Diagnosis not present

## 2017-11-25 LAB — CUP PACEART INCLINIC DEVICE CHECK
Battery Remaining Longevity: 42 mo
Battery Voltage: 2.98 V
Brady Statistic AP VP Percent: 0.02 %
Brady Statistic AP VS Percent: 0 %
Brady Statistic AS VP Percent: 99.93 %
Brady Statistic AS VS Percent: 0.05 %
Brady Statistic RA Percent Paced: 0.02 %
Brady Statistic RV Percent Paced: 98.44 %
Date Time Interrogation Session: 20190424031242
Implantable Lead Implant Date: 20131024
Implantable Lead Implant Date: 20131024
Implantable Lead Implant Date: 20131028
Implantable Lead Location: 753858
Implantable Lead Location: 753859
Implantable Lead Location: 753860
Implantable Lead Model: 1944
Implantable Lead Model: 1948
Implantable Lead Model: 4194
Implantable Pulse Generator Implant Date: 20131024
Lead Channel Impedance Value: 323 Ohm
Lead Channel Impedance Value: 418 Ohm
Lead Channel Impedance Value: 456 Ohm
Lead Channel Impedance Value: 494 Ohm
Lead Channel Impedance Value: 513 Ohm
Lead Channel Impedance Value: 513 Ohm
Lead Channel Impedance Value: 570 Ohm
Lead Channel Impedance Value: 627 Ohm
Lead Channel Impedance Value: 684 Ohm
Lead Channel Pacing Threshold Amplitude: 0.75 V
Lead Channel Pacing Threshold Amplitude: 0.75 V
Lead Channel Pacing Threshold Amplitude: 1 V
Lead Channel Pacing Threshold Pulse Width: 0.4 ms
Lead Channel Pacing Threshold Pulse Width: 0.4 ms
Lead Channel Pacing Threshold Pulse Width: 0.4 ms
Lead Channel Sensing Intrinsic Amplitude: 2.375 mV
Lead Channel Sensing Intrinsic Amplitude: 2.625 mV
Lead Channel Sensing Intrinsic Amplitude: 5 mV
Lead Channel Sensing Intrinsic Amplitude: 5 mV
Lead Channel Setting Pacing Amplitude: 2 V
Lead Channel Setting Pacing Amplitude: 2 V
Lead Channel Setting Pacing Amplitude: 2.5 V
Lead Channel Setting Pacing Pulse Width: 0.4 ms
Lead Channel Setting Pacing Pulse Width: 0.4 ms
Lead Channel Setting Sensing Sensitivity: 2.8 mV

## 2017-11-26 ENCOUNTER — Telehealth: Payer: Self-pay | Admitting: Internal Medicine

## 2017-11-26 DIAGNOSIS — R2681 Unsteadiness on feet: Secondary | ICD-10-CM | POA: Diagnosis not present

## 2017-11-26 DIAGNOSIS — M6281 Muscle weakness (generalized): Secondary | ICD-10-CM | POA: Diagnosis not present

## 2017-11-26 DIAGNOSIS — R2689 Other abnormalities of gait and mobility: Secondary | ICD-10-CM | POA: Diagnosis not present

## 2017-11-26 DIAGNOSIS — Z9181 History of falling: Secondary | ICD-10-CM | POA: Diagnosis not present

## 2017-11-26 DIAGNOSIS — R262 Difficulty in walking, not elsewhere classified: Secondary | ICD-10-CM | POA: Diagnosis not present

## 2017-11-26 DIAGNOSIS — R278 Other lack of coordination: Secondary | ICD-10-CM | POA: Diagnosis not present

## 2017-11-26 NOTE — Telephone Encounter (Signed)
New message    Dr.Ehinger verbalized that he is calling to speak to Dr.Klein about  The current status of patient and to inform him of some results   He states he will be in his office until 3:30pm today

## 2017-11-26 NOTE — Telephone Encounter (Signed)
Dr Caryl Comes notified of request

## 2017-11-27 DIAGNOSIS — R262 Difficulty in walking, not elsewhere classified: Secondary | ICD-10-CM | POA: Diagnosis not present

## 2017-11-27 DIAGNOSIS — R2689 Other abnormalities of gait and mobility: Secondary | ICD-10-CM | POA: Diagnosis not present

## 2017-11-27 DIAGNOSIS — R278 Other lack of coordination: Secondary | ICD-10-CM | POA: Diagnosis not present

## 2017-11-27 DIAGNOSIS — M6281 Muscle weakness (generalized): Secondary | ICD-10-CM | POA: Diagnosis not present

## 2017-11-27 DIAGNOSIS — Z9181 History of falling: Secondary | ICD-10-CM | POA: Diagnosis not present

## 2017-11-27 DIAGNOSIS — R2681 Unsteadiness on feet: Secondary | ICD-10-CM | POA: Diagnosis not present

## 2017-11-27 NOTE — Telephone Encounter (Signed)
Spoke with Dr RE Will defer to him management of her fluid

## 2017-11-30 DIAGNOSIS — M6281 Muscle weakness (generalized): Secondary | ICD-10-CM | POA: Diagnosis not present

## 2017-11-30 DIAGNOSIS — R262 Difficulty in walking, not elsewhere classified: Secondary | ICD-10-CM | POA: Diagnosis not present

## 2017-11-30 DIAGNOSIS — R278 Other lack of coordination: Secondary | ICD-10-CM | POA: Diagnosis not present

## 2017-11-30 DIAGNOSIS — R2681 Unsteadiness on feet: Secondary | ICD-10-CM | POA: Diagnosis not present

## 2017-11-30 DIAGNOSIS — Z9181 History of falling: Secondary | ICD-10-CM | POA: Diagnosis not present

## 2017-11-30 DIAGNOSIS — R2689 Other abnormalities of gait and mobility: Secondary | ICD-10-CM | POA: Diagnosis not present

## 2017-12-01 ENCOUNTER — Encounter: Payer: Self-pay | Admitting: Cardiology

## 2017-12-01 ENCOUNTER — Other Ambulatory Visit: Payer: Medicare Other

## 2017-12-01 ENCOUNTER — Other Ambulatory Visit: Payer: Self-pay

## 2017-12-01 ENCOUNTER — Ambulatory Visit (HOSPITAL_COMMUNITY): Payer: Medicare Other | Attending: Cardiology

## 2017-12-01 DIAGNOSIS — I420 Dilated cardiomyopathy: Secondary | ICD-10-CM

## 2017-12-01 DIAGNOSIS — I5032 Chronic diastolic (congestive) heart failure: Secondary | ICD-10-CM

## 2017-12-01 DIAGNOSIS — I272 Pulmonary hypertension, unspecified: Secondary | ICD-10-CM | POA: Insufficient documentation

## 2017-12-01 DIAGNOSIS — I447 Left bundle-branch block, unspecified: Secondary | ICD-10-CM | POA: Diagnosis not present

## 2017-12-01 DIAGNOSIS — Z950 Presence of cardiac pacemaker: Secondary | ICD-10-CM | POA: Diagnosis not present

## 2017-12-01 DIAGNOSIS — I081 Rheumatic disorders of both mitral and tricuspid valves: Secondary | ICD-10-CM | POA: Diagnosis not present

## 2017-12-01 LAB — CBC WITH DIFFERENTIAL/PLATELET
Basophils Absolute: 0 10*3/uL (ref 0.0–0.2)
Basos: 0 %
EOS (ABSOLUTE): 0.1 10*3/uL (ref 0.0–0.4)
Eos: 1 %
Hematocrit: 32 % — ABNORMAL LOW (ref 34.0–46.6)
Hemoglobin: 10.8 g/dL — ABNORMAL LOW (ref 11.1–15.9)
Immature Grans (Abs): 0.1 10*3/uL (ref 0.0–0.1)
Immature Granulocytes: 1 %
Lymphocytes Absolute: 2.8 10*3/uL (ref 0.7–3.1)
Lymphs: 38 %
MCH: 32.9 pg (ref 26.6–33.0)
MCHC: 33.8 g/dL (ref 31.5–35.7)
MCV: 98 fL — ABNORMAL HIGH (ref 79–97)
Monocytes Absolute: 0.7 10*3/uL (ref 0.1–0.9)
Monocytes: 10 %
Neutrophils Absolute: 3.7 10*3/uL (ref 1.4–7.0)
Neutrophils: 50 %
Platelets: 214 10*3/uL (ref 150–379)
RBC: 3.28 x10E6/uL — ABNORMAL LOW (ref 3.77–5.28)
RDW: 15.4 % (ref 12.3–15.4)
WBC: 7.4 10*3/uL (ref 3.4–10.8)

## 2017-12-01 LAB — BASIC METABOLIC PANEL
BUN/Creatinine Ratio: 18 (ref 12–28)
BUN: 22 mg/dL (ref 10–36)
CO2: 29 mmol/L (ref 20–29)
Calcium: 9.7 mg/dL (ref 8.7–10.3)
Chloride: 99 mmol/L (ref 96–106)
Creatinine, Ser: 1.25 mg/dL — ABNORMAL HIGH (ref 0.57–1.00)
GFR calc Af Amer: 43 mL/min/{1.73_m2} — ABNORMAL LOW (ref >59)
GFR calc non Af Amer: 37 mL/min/{1.73_m2} — ABNORMAL LOW (ref >59)
Glucose: 101 mg/dL — ABNORMAL HIGH (ref 65–99)
Potassium: 4.1 mmol/L (ref 3.5–5.2)
Sodium: 145 mmol/L — ABNORMAL HIGH (ref 134–144)

## 2017-12-02 ENCOUNTER — Telehealth: Payer: Self-pay

## 2017-12-02 DIAGNOSIS — M1712 Unilateral primary osteoarthritis, left knee: Secondary | ICD-10-CM | POA: Diagnosis not present

## 2017-12-02 DIAGNOSIS — M1711 Unilateral primary osteoarthritis, right knee: Secondary | ICD-10-CM | POA: Diagnosis not present

## 2017-12-02 NOTE — Telephone Encounter (Signed)
-----   Message from Deboraha Sprang, MD sent at 12/02/2017 10:17 AM EDT ----- Please Inform Patient that labs are normal  With improved Hgb and Cr  Thanks

## 2017-12-02 NOTE — Telephone Encounter (Signed)
Pt is aware and agreeable to lab results. 

## 2017-12-03 DIAGNOSIS — R262 Difficulty in walking, not elsewhere classified: Secondary | ICD-10-CM | POA: Diagnosis not present

## 2017-12-03 DIAGNOSIS — R2681 Unsteadiness on feet: Secondary | ICD-10-CM | POA: Diagnosis not present

## 2017-12-03 DIAGNOSIS — R2689 Other abnormalities of gait and mobility: Secondary | ICD-10-CM | POA: Diagnosis not present

## 2017-12-03 DIAGNOSIS — R278 Other lack of coordination: Secondary | ICD-10-CM | POA: Diagnosis not present

## 2017-12-03 DIAGNOSIS — Z9181 History of falling: Secondary | ICD-10-CM | POA: Diagnosis not present

## 2017-12-03 DIAGNOSIS — M6281 Muscle weakness (generalized): Secondary | ICD-10-CM | POA: Diagnosis not present

## 2017-12-04 DIAGNOSIS — R278 Other lack of coordination: Secondary | ICD-10-CM | POA: Diagnosis not present

## 2017-12-04 DIAGNOSIS — R2681 Unsteadiness on feet: Secondary | ICD-10-CM | POA: Diagnosis not present

## 2017-12-04 DIAGNOSIS — M6281 Muscle weakness (generalized): Secondary | ICD-10-CM | POA: Diagnosis not present

## 2017-12-04 DIAGNOSIS — R262 Difficulty in walking, not elsewhere classified: Secondary | ICD-10-CM | POA: Diagnosis not present

## 2017-12-04 DIAGNOSIS — R2689 Other abnormalities of gait and mobility: Secondary | ICD-10-CM | POA: Diagnosis not present

## 2017-12-04 DIAGNOSIS — Z9181 History of falling: Secondary | ICD-10-CM | POA: Diagnosis not present

## 2017-12-07 DIAGNOSIS — Z9181 History of falling: Secondary | ICD-10-CM | POA: Diagnosis not present

## 2017-12-07 DIAGNOSIS — R2681 Unsteadiness on feet: Secondary | ICD-10-CM | POA: Diagnosis not present

## 2017-12-07 DIAGNOSIS — R278 Other lack of coordination: Secondary | ICD-10-CM | POA: Diagnosis not present

## 2017-12-07 DIAGNOSIS — R262 Difficulty in walking, not elsewhere classified: Secondary | ICD-10-CM | POA: Diagnosis not present

## 2017-12-07 DIAGNOSIS — R2689 Other abnormalities of gait and mobility: Secondary | ICD-10-CM | POA: Diagnosis not present

## 2017-12-07 DIAGNOSIS — M6281 Muscle weakness (generalized): Secondary | ICD-10-CM | POA: Diagnosis not present

## 2017-12-08 DIAGNOSIS — R2689 Other abnormalities of gait and mobility: Secondary | ICD-10-CM | POA: Diagnosis not present

## 2017-12-08 DIAGNOSIS — R262 Difficulty in walking, not elsewhere classified: Secondary | ICD-10-CM | POA: Diagnosis not present

## 2017-12-08 DIAGNOSIS — M6281 Muscle weakness (generalized): Secondary | ICD-10-CM | POA: Diagnosis not present

## 2017-12-08 DIAGNOSIS — R278 Other lack of coordination: Secondary | ICD-10-CM | POA: Diagnosis not present

## 2017-12-08 DIAGNOSIS — Z9181 History of falling: Secondary | ICD-10-CM | POA: Diagnosis not present

## 2017-12-08 DIAGNOSIS — R2681 Unsteadiness on feet: Secondary | ICD-10-CM | POA: Diagnosis not present

## 2017-12-09 DIAGNOSIS — Z9181 History of falling: Secondary | ICD-10-CM | POA: Diagnosis not present

## 2017-12-09 DIAGNOSIS — M1711 Unilateral primary osteoarthritis, right knee: Secondary | ICD-10-CM | POA: Diagnosis not present

## 2017-12-09 DIAGNOSIS — R262 Difficulty in walking, not elsewhere classified: Secondary | ICD-10-CM | POA: Diagnosis not present

## 2017-12-09 DIAGNOSIS — R278 Other lack of coordination: Secondary | ICD-10-CM | POA: Diagnosis not present

## 2017-12-09 DIAGNOSIS — R2681 Unsteadiness on feet: Secondary | ICD-10-CM | POA: Diagnosis not present

## 2017-12-09 DIAGNOSIS — R2689 Other abnormalities of gait and mobility: Secondary | ICD-10-CM | POA: Diagnosis not present

## 2017-12-09 DIAGNOSIS — M6281 Muscle weakness (generalized): Secondary | ICD-10-CM | POA: Diagnosis not present

## 2017-12-09 DIAGNOSIS — M1712 Unilateral primary osteoarthritis, left knee: Secondary | ICD-10-CM | POA: Diagnosis not present

## 2017-12-10 DIAGNOSIS — R2689 Other abnormalities of gait and mobility: Secondary | ICD-10-CM | POA: Diagnosis not present

## 2017-12-10 DIAGNOSIS — Z9181 History of falling: Secondary | ICD-10-CM | POA: Diagnosis not present

## 2017-12-10 DIAGNOSIS — R278 Other lack of coordination: Secondary | ICD-10-CM | POA: Diagnosis not present

## 2017-12-10 DIAGNOSIS — R262 Difficulty in walking, not elsewhere classified: Secondary | ICD-10-CM | POA: Diagnosis not present

## 2017-12-10 DIAGNOSIS — M6281 Muscle weakness (generalized): Secondary | ICD-10-CM | POA: Diagnosis not present

## 2017-12-10 DIAGNOSIS — R2681 Unsteadiness on feet: Secondary | ICD-10-CM | POA: Diagnosis not present

## 2017-12-11 DIAGNOSIS — Z9181 History of falling: Secondary | ICD-10-CM | POA: Diagnosis not present

## 2017-12-11 DIAGNOSIS — M6281 Muscle weakness (generalized): Secondary | ICD-10-CM | POA: Diagnosis not present

## 2017-12-11 DIAGNOSIS — R262 Difficulty in walking, not elsewhere classified: Secondary | ICD-10-CM | POA: Diagnosis not present

## 2017-12-11 DIAGNOSIS — R2689 Other abnormalities of gait and mobility: Secondary | ICD-10-CM | POA: Diagnosis not present

## 2017-12-11 DIAGNOSIS — R2681 Unsteadiness on feet: Secondary | ICD-10-CM | POA: Diagnosis not present

## 2017-12-11 DIAGNOSIS — R278 Other lack of coordination: Secondary | ICD-10-CM | POA: Diagnosis not present

## 2017-12-14 DIAGNOSIS — R278 Other lack of coordination: Secondary | ICD-10-CM | POA: Diagnosis not present

## 2017-12-14 DIAGNOSIS — R2681 Unsteadiness on feet: Secondary | ICD-10-CM | POA: Diagnosis not present

## 2017-12-14 DIAGNOSIS — M6281 Muscle weakness (generalized): Secondary | ICD-10-CM | POA: Diagnosis not present

## 2017-12-14 DIAGNOSIS — R262 Difficulty in walking, not elsewhere classified: Secondary | ICD-10-CM | POA: Diagnosis not present

## 2017-12-14 DIAGNOSIS — R2689 Other abnormalities of gait and mobility: Secondary | ICD-10-CM | POA: Diagnosis not present

## 2017-12-14 DIAGNOSIS — Z9181 History of falling: Secondary | ICD-10-CM | POA: Diagnosis not present

## 2017-12-15 DIAGNOSIS — M6281 Muscle weakness (generalized): Secondary | ICD-10-CM | POA: Diagnosis not present

## 2017-12-15 DIAGNOSIS — Z9181 History of falling: Secondary | ICD-10-CM | POA: Diagnosis not present

## 2017-12-15 DIAGNOSIS — R2681 Unsteadiness on feet: Secondary | ICD-10-CM | POA: Diagnosis not present

## 2017-12-15 DIAGNOSIS — R278 Other lack of coordination: Secondary | ICD-10-CM | POA: Diagnosis not present

## 2017-12-15 DIAGNOSIS — R262 Difficulty in walking, not elsewhere classified: Secondary | ICD-10-CM | POA: Diagnosis not present

## 2017-12-15 DIAGNOSIS — R2689 Other abnormalities of gait and mobility: Secondary | ICD-10-CM | POA: Diagnosis not present

## 2017-12-16 DIAGNOSIS — R2689 Other abnormalities of gait and mobility: Secondary | ICD-10-CM | POA: Diagnosis not present

## 2017-12-16 DIAGNOSIS — M6281 Muscle weakness (generalized): Secondary | ICD-10-CM | POA: Diagnosis not present

## 2017-12-16 DIAGNOSIS — M1711 Unilateral primary osteoarthritis, right knee: Secondary | ICD-10-CM | POA: Diagnosis not present

## 2017-12-16 DIAGNOSIS — R278 Other lack of coordination: Secondary | ICD-10-CM | POA: Diagnosis not present

## 2017-12-16 DIAGNOSIS — R2681 Unsteadiness on feet: Secondary | ICD-10-CM | POA: Diagnosis not present

## 2017-12-16 DIAGNOSIS — R262 Difficulty in walking, not elsewhere classified: Secondary | ICD-10-CM | POA: Diagnosis not present

## 2017-12-16 DIAGNOSIS — Z9181 History of falling: Secondary | ICD-10-CM | POA: Diagnosis not present

## 2017-12-17 DIAGNOSIS — M6281 Muscle weakness (generalized): Secondary | ICD-10-CM | POA: Diagnosis not present

## 2017-12-17 DIAGNOSIS — R278 Other lack of coordination: Secondary | ICD-10-CM | POA: Diagnosis not present

## 2017-12-17 DIAGNOSIS — R262 Difficulty in walking, not elsewhere classified: Secondary | ICD-10-CM | POA: Diagnosis not present

## 2017-12-17 DIAGNOSIS — R2681 Unsteadiness on feet: Secondary | ICD-10-CM | POA: Diagnosis not present

## 2017-12-17 DIAGNOSIS — R2689 Other abnormalities of gait and mobility: Secondary | ICD-10-CM | POA: Diagnosis not present

## 2017-12-17 DIAGNOSIS — Z9181 History of falling: Secondary | ICD-10-CM | POA: Diagnosis not present

## 2017-12-18 DIAGNOSIS — R2689 Other abnormalities of gait and mobility: Secondary | ICD-10-CM | POA: Diagnosis not present

## 2017-12-18 DIAGNOSIS — R262 Difficulty in walking, not elsewhere classified: Secondary | ICD-10-CM | POA: Diagnosis not present

## 2017-12-18 DIAGNOSIS — M6281 Muscle weakness (generalized): Secondary | ICD-10-CM | POA: Diagnosis not present

## 2017-12-18 DIAGNOSIS — R278 Other lack of coordination: Secondary | ICD-10-CM | POA: Diagnosis not present

## 2017-12-18 DIAGNOSIS — Z9181 History of falling: Secondary | ICD-10-CM | POA: Diagnosis not present

## 2017-12-18 DIAGNOSIS — R2681 Unsteadiness on feet: Secondary | ICD-10-CM | POA: Diagnosis not present

## 2017-12-21 DIAGNOSIS — R2689 Other abnormalities of gait and mobility: Secondary | ICD-10-CM | POA: Diagnosis not present

## 2017-12-21 DIAGNOSIS — R2681 Unsteadiness on feet: Secondary | ICD-10-CM | POA: Diagnosis not present

## 2017-12-21 DIAGNOSIS — Z9181 History of falling: Secondary | ICD-10-CM | POA: Diagnosis not present

## 2017-12-21 DIAGNOSIS — R262 Difficulty in walking, not elsewhere classified: Secondary | ICD-10-CM | POA: Diagnosis not present

## 2017-12-21 DIAGNOSIS — M6281 Muscle weakness (generalized): Secondary | ICD-10-CM | POA: Diagnosis not present

## 2017-12-21 DIAGNOSIS — R278 Other lack of coordination: Secondary | ICD-10-CM | POA: Diagnosis not present

## 2017-12-22 DIAGNOSIS — Z9181 History of falling: Secondary | ICD-10-CM | POA: Diagnosis not present

## 2017-12-22 DIAGNOSIS — M6281 Muscle weakness (generalized): Secondary | ICD-10-CM | POA: Diagnosis not present

## 2017-12-22 DIAGNOSIS — R262 Difficulty in walking, not elsewhere classified: Secondary | ICD-10-CM | POA: Diagnosis not present

## 2017-12-22 DIAGNOSIS — R2689 Other abnormalities of gait and mobility: Secondary | ICD-10-CM | POA: Diagnosis not present

## 2017-12-22 DIAGNOSIS — R2681 Unsteadiness on feet: Secondary | ICD-10-CM | POA: Diagnosis not present

## 2017-12-22 DIAGNOSIS — R278 Other lack of coordination: Secondary | ICD-10-CM | POA: Diagnosis not present

## 2017-12-23 DIAGNOSIS — E538 Deficiency of other specified B group vitamins: Secondary | ICD-10-CM | POA: Diagnosis not present

## 2017-12-23 DIAGNOSIS — R278 Other lack of coordination: Secondary | ICD-10-CM | POA: Diagnosis not present

## 2017-12-23 DIAGNOSIS — Z9181 History of falling: Secondary | ICD-10-CM | POA: Diagnosis not present

## 2017-12-23 DIAGNOSIS — R2689 Other abnormalities of gait and mobility: Secondary | ICD-10-CM | POA: Diagnosis not present

## 2017-12-23 DIAGNOSIS — M6281 Muscle weakness (generalized): Secondary | ICD-10-CM | POA: Diagnosis not present

## 2017-12-23 DIAGNOSIS — R2681 Unsteadiness on feet: Secondary | ICD-10-CM | POA: Diagnosis not present

## 2017-12-23 DIAGNOSIS — R262 Difficulty in walking, not elsewhere classified: Secondary | ICD-10-CM | POA: Diagnosis not present

## 2017-12-29 DIAGNOSIS — R262 Difficulty in walking, not elsewhere classified: Secondary | ICD-10-CM | POA: Diagnosis not present

## 2017-12-29 DIAGNOSIS — M6281 Muscle weakness (generalized): Secondary | ICD-10-CM | POA: Diagnosis not present

## 2017-12-29 DIAGNOSIS — R2689 Other abnormalities of gait and mobility: Secondary | ICD-10-CM | POA: Diagnosis not present

## 2017-12-29 DIAGNOSIS — R2681 Unsteadiness on feet: Secondary | ICD-10-CM | POA: Diagnosis not present

## 2017-12-29 DIAGNOSIS — R278 Other lack of coordination: Secondary | ICD-10-CM | POA: Diagnosis not present

## 2017-12-29 DIAGNOSIS — Z9181 History of falling: Secondary | ICD-10-CM | POA: Diagnosis not present

## 2017-12-30 DIAGNOSIS — R262 Difficulty in walking, not elsewhere classified: Secondary | ICD-10-CM | POA: Diagnosis not present

## 2017-12-30 DIAGNOSIS — Z9181 History of falling: Secondary | ICD-10-CM | POA: Diagnosis not present

## 2017-12-30 DIAGNOSIS — R2681 Unsteadiness on feet: Secondary | ICD-10-CM | POA: Diagnosis not present

## 2017-12-30 DIAGNOSIS — R2689 Other abnormalities of gait and mobility: Secondary | ICD-10-CM | POA: Diagnosis not present

## 2017-12-30 DIAGNOSIS — M6281 Muscle weakness (generalized): Secondary | ICD-10-CM | POA: Diagnosis not present

## 2017-12-30 DIAGNOSIS — R278 Other lack of coordination: Secondary | ICD-10-CM | POA: Diagnosis not present

## 2017-12-31 ENCOUNTER — Other Ambulatory Visit: Payer: Self-pay | Admitting: Cardiology

## 2017-12-31 DIAGNOSIS — H353213 Exudative age-related macular degeneration, right eye, with inactive scar: Secondary | ICD-10-CM | POA: Diagnosis not present

## 2017-12-31 DIAGNOSIS — H43813 Vitreous degeneration, bilateral: Secondary | ICD-10-CM | POA: Diagnosis not present

## 2017-12-31 DIAGNOSIS — H3589 Other specified retinal disorders: Secondary | ICD-10-CM | POA: Diagnosis not present

## 2017-12-31 DIAGNOSIS — H353124 Nonexudative age-related macular degeneration, left eye, advanced atrophic with subfoveal involvement: Secondary | ICD-10-CM | POA: Diagnosis not present

## 2017-12-31 DIAGNOSIS — Z961 Presence of intraocular lens: Secondary | ICD-10-CM | POA: Diagnosis not present

## 2018-01-01 DIAGNOSIS — R278 Other lack of coordination: Secondary | ICD-10-CM | POA: Diagnosis not present

## 2018-01-01 DIAGNOSIS — R2689 Other abnormalities of gait and mobility: Secondary | ICD-10-CM | POA: Diagnosis not present

## 2018-01-01 DIAGNOSIS — M6281 Muscle weakness (generalized): Secondary | ICD-10-CM | POA: Diagnosis not present

## 2018-01-01 DIAGNOSIS — R262 Difficulty in walking, not elsewhere classified: Secondary | ICD-10-CM | POA: Diagnosis not present

## 2018-01-01 DIAGNOSIS — R2681 Unsteadiness on feet: Secondary | ICD-10-CM | POA: Diagnosis not present

## 2018-01-01 DIAGNOSIS — Z9181 History of falling: Secondary | ICD-10-CM | POA: Diagnosis not present

## 2018-01-04 DIAGNOSIS — R262 Difficulty in walking, not elsewhere classified: Secondary | ICD-10-CM | POA: Diagnosis not present

## 2018-01-04 DIAGNOSIS — R278 Other lack of coordination: Secondary | ICD-10-CM | POA: Diagnosis not present

## 2018-01-04 DIAGNOSIS — R2681 Unsteadiness on feet: Secondary | ICD-10-CM | POA: Diagnosis not present

## 2018-01-04 DIAGNOSIS — Z9181 History of falling: Secondary | ICD-10-CM | POA: Diagnosis not present

## 2018-01-04 DIAGNOSIS — R2689 Other abnormalities of gait and mobility: Secondary | ICD-10-CM | POA: Diagnosis not present

## 2018-01-04 DIAGNOSIS — M6281 Muscle weakness (generalized): Secondary | ICD-10-CM | POA: Diagnosis not present

## 2018-01-06 ENCOUNTER — Ambulatory Visit (INDEPENDENT_AMBULATORY_CARE_PROVIDER_SITE_OTHER): Payer: Medicare Other | Admitting: Cardiology

## 2018-01-06 ENCOUNTER — Encounter: Payer: Self-pay | Admitting: Cardiology

## 2018-01-06 VITALS — BP 114/62 | HR 97 | Ht 64.0 in | Wt 170.6 lb

## 2018-01-06 DIAGNOSIS — I5032 Chronic diastolic (congestive) heart failure: Secondary | ICD-10-CM

## 2018-01-06 DIAGNOSIS — I10 Essential (primary) hypertension: Secondary | ICD-10-CM | POA: Diagnosis not present

## 2018-01-06 DIAGNOSIS — I25118 Atherosclerotic heart disease of native coronary artery with other forms of angina pectoris: Secondary | ICD-10-CM | POA: Diagnosis not present

## 2018-01-06 DIAGNOSIS — E7800 Pure hypercholesterolemia, unspecified: Secondary | ICD-10-CM | POA: Diagnosis not present

## 2018-01-06 DIAGNOSIS — I420 Dilated cardiomyopathy: Secondary | ICD-10-CM | POA: Diagnosis not present

## 2018-01-06 DIAGNOSIS — I272 Pulmonary hypertension, unspecified: Secondary | ICD-10-CM

## 2018-01-06 NOTE — Patient Instructions (Signed)
Medication Instructions:  Your physician recommends that you continue on your current medications as directed. Please refer to the Current Medication list given to you today.  If you need a refill on your cardiac medications, please contact your pharmacy first.  Labwork: Your physician recommends that you return for lab work in: 1 week for fasting lipid panel   Testing/Procedures: None ordered   Follow-Up: Your physician wants you to follow-up in: 6 months with Dr. Radford Pax. You will receive a reminder letter in the mail two months in advance. If you don't receive a letter, please call our office to schedule the follow-up appointment.  Any Other Special Instructions Will Be Listed Below (If Applicable).   Thank you for choosing [facility], RN  [phone]  If you need a refill on your cardiac medications before your next appointment, please call your pharmacy.

## 2018-01-06 NOTE — Progress Notes (Signed)
Cardiology Office Note:    Date:  01/06/2018   ID:  [name], DOB 21-Jul-1923, MRN [id number]  PCP:  Monica Arabian, MD  Cardiologist:  Fransico Him, MD    Referring MD: Monica Arabian, MD   Chief Complaint  Patient presents with  . Coronary Artery Disease  . Hypertension  . Hyperlipidemia    History of Present Illness:    [name] is a 84 y.o. female with a hx of ASCAD s/p DES to LAD with residual disease of the diag too small for PCI by cath 01/2010 and then repeat cath with restenosis of the LADs/p PCI 10/2010, chronic stable angina, ischemic DCM ( EF 45% on echo 08/2015), LBBB, BiVPPM, HTN, dyslipidemia and chronic systolic CHF.  She was admitted to the hospital back in March due to a fall and was found to have a left femoral neck fracture.  Her hospitalization was complicated by a left arm DVT and was started on Eliquis twice daily and stopped aspirin.  She also had postoperative hypotension and her carvedilol and ramipril were stopped.  Was seen back by her PCP who recommended not restarting ramipril because of a GFR in the low 30 range.  Plan was to restart carvedilol for blood pressure started trending upward.  She was seen by Dr. Caryl Comes on 11/24/2017 for follow-up of her CRT-P.  She is here today for followup and is doing well.  She has chronic stable angina which has been very stable with no change in frequency or severity.  She also has chronic dyspnea on exertion which again is stable as well and chronic lower extremity edema is controlled with diuretic use.  She lives in an apartment and uses a walker to get around.  She denies any  PND, orthopnea, dizziness, palpitations or syncope. She is compliant with her meds and is tolerating meds with no SE.      Past Medical History:  Diagnosis Date  . Breast cancer ([place])    bilaterally  . CAD (coronary artery disease)    w/ DES to LAD with residual diagonal disease to small for PCI 6/11 and repeat DES to LAD for restenosis  3/12  . Cardiomyopathy ([place])    Mixed CM; s/p St. Jude CRT-P implant 10/24 w/ LV lead revision 10/25 . Echo 11-2017 EF 55%.    . Chronic diastolic (congestive) heart failure ([place])   . Dyslipidemia, goal LDL below 70   . GERD (gastroesophageal reflux disease)    occ  . Hematuria 02/23/2013   w/clots required hospital admission on 02/23/2013  . Hypertension   . LBBB (left bundle branch block)   . Macular degeneration   . Mild mitral regurgitation   . Osteoarthritis    "right knee" (02/23/2013)  . Osteopenia   . Pacemaker   . Pulmonary HTN ([facility])    PASP 34mHg by echo 11/2017  . Umbilical hernia    Repaired on 01/10/2013   . Vitamin B12 deficiency     Past Surgical History:  Procedure Laterality Date  . BI-VENTRICULAR PACEMAKER INSERTION N/A 05/27/2012   Procedure: BI-VENTRICULAR PACEMAKER INSERTION (CRT-P);  Surgeon: SDeboraha Sprang MD;  [facility];  Service: Cardiovascular;  Laterality: N/A;  . BREAST BIOPSY  1995   bilaterally  . CATARACT EXTRACTION    . CATARACT EXTRACTION W/ INTRAOCULAR LENS  IMPLANT, BILATERAL  1990's  . CORONARY ANGIOPLASTY WITH STENT PLACEMENT  2010   "1" (05/27/2012)  . HERNIA REPAIR    . HIP  ARTHROPLASTY Left 10/08/2017   Procedure: LEFT HIP HIMIARTHROPLASTY;  Surgeon: Melrose Nakayama, MD;  Location: [place];  Service: Orthopedics;  Laterality: Left;  . INSERT / REPLACE / REMOVE PACEMAKER  05/27/2012   initial placement - BiV PPM  . INSERT / REPLACE / REMOVE PACEMAKER  05/31/2012   "lead change/repaired" (05/31/2012)  . INSERTION OF MESH N/A 01/10/2013   Procedure: INSERTION OF MESH;  Surgeon: Haywood Lasso, MD;  Location: [place];  Service: General;  Laterality: N/A;  . JOINT REPLACEMENT    . LEAD REVISION N/A 05/28/2012   Procedure: LEAD REVISION;  Surgeon: Evans Lance, MD;  Location: [facility] CATH LAB;  Service: Cardiovascular;  Laterality: N/A;  . LEAD REVISION Left 05/31/2012   Procedure: LEAD REVISION;  Surgeon: Deboraha Sprang, MD;  Location:  [facility] CATH LAB;  Service: Cardiovascular;  Laterality: Left;  Marland Kitchen MASTECTOMY  1995   bilaterally  . TONSILLECTOMY    . TOTAL HIP ARTHROPLASTY  2011   right  . UMBILICAL HERNIA REPAIR N/A 01/10/2013   Procedure: HERNIA REPAIR UMBILICAL ADULT;  Surgeon: Haywood Lasso, MD;  Location: [place];  Service: General;  Laterality: N/A;    Current Medications: Current Meds  Medication Sig  . acetaminophen (TYLENOL) 500 MG tablet Take 500-1,000 mg by mouth every 6 (six) hours as needed.  Marland Kitchen albuterol (PROVENTIL HFA;VENTOLIN HFA) 108 (90 Base) MCG/ACT inhaler Inhale 1-2 puffs into the lungs every 6 (six) hours as needed for wheezing or shortness of breath.  . Apixaban (ELIQUIS PO) Take 1 tablet by mouth 2 (two) times daily.  Marland Kitchen aspirin EC 81 MG tablet Take 81 mg by mouth daily.  . Cholecalciferol (VITAMIN D-3) 5000 UNITS TABS Take 5,000 Units by mouth daily.   . Cyanocobalamin (B-12) 1000 MCG/ML KIT Inject 1,000 mcg as directed every 30 (thirty) days.  . furosemide (LASIX) 40 MG tablet TAKE ONE TABLET EACH DAY  . isosorbide mononitrate (IMDUR) 60 MG 24 hr tablet TAKE ONE AND ONE-HALF TABLETS DAILY  . Multiple Vitamin (MULTIVITAMIN WITH MINERALS) TABS tablet Take 1 tablet by mouth daily.  . Multiple Vitamins-Minerals (PRESERVISION/LUTEIN) CAPS Take 1 capsule by mouth 2 (two) times daily.  . nitroGLYCERIN (NITROSTAT) 0.4 MG SL tablet Place 1 tablet (0.4 mg total) every 5 (five) minutes as needed under the tongue for chest pain.  Marland Kitchen omeprazole (PRILOSEC) 20 MG capsule Take 20 mg by mouth daily.  . simvastatin (ZOCOR) 20 MG tablet TAKE ONE TABLET EACH DAY AT 6PM  . vitamin C (ASCORBIC ACID) 500 MG tablet Take 500 mg by mouth daily.     Allergies:   Altace [ramipril] and Celebrex [celecoxib]   Social History   Socioeconomic History  . Marital status: Widowed    Spouse name: Not on file  . Number of children: Not on file  . Years of education: Not on file  . Highest education level: Not on file    Occupational History  . Not on file  Social Needs  . Financial resource strain: Not on file  . Food insecurity:    Worry: Not on file    Inability: Not on file  . Transportation needs:    Medical: Not on file    Non-medical: Not on file  Tobacco Use  . Smoking status: Former Smoker    Packs/day: 1.00    Years: 20.00    Pack years: 20.00    Types: Cigarettes    Start date: 03/17/1943    Last attempt to quit: 01/05/1963  Years since quitting: 55.0  . Smokeless tobacco: Never Used  Substance and Sexual Activity  . Alcohol use: Yes    Alcohol/week: 3.0 oz    Types: 5 Glasses of wine per week    Comment: 02/23/2013 "glass of wine ~ 5X/wk":  . Drug use: No  . Sexual activity: Not Currently  Lifestyle  . Physical activity:    Days per week: Not on file    Minutes per session: Not on file  . Stress: Not on file  Relationships  . Social connections:    Talks on phone: Not on file    Gets together: Not on file    Attends religious service: Not on file    Active member of club or organization: Not on file    Attends meetings of clubs or organizations: Not on file    Relationship status: Not on file  Other Topics Concern  . Not on file  Social History Narrative   Originally from [place], [place]. Previously lived in [place], [place]. Graduated college with a business degree (BS in [employer]). Currently enjoys reading. Previously enjoyed sewing and knitting. Previously also enjoyed water skiing. Previously worked as a Network engineer. Husband was a Personnel officer. Also previously worked for the [employer] for a year. 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 Doesn't have down pillows. No mold or asbestos exposure.      Family History: The patient's family history includes Asthma in her grandchild; Breast cancer in her daughter, maternal aunt, maternal aunt, and mother; Heart disease in her father and mother.  ROS:   Please see the history of present illness.     ROS  All other systems reviewed and negative.   EKGs/Labs/Other Studies Reviewed:    The following studies were reviewed today: Hospital notes from 10/2017  EKG:  EKG is not ordered today  Recent Labs: 10/06/2017: ALT 14; B Natriuretic Peptide 161.0 10/11/2017: Magnesium 2.6 12/01/2017: BUN 22; Creatinine, Ser 1.25; Hemoglobin 10.8; Platelets 214; Potassium 4.1; Sodium 145   Recent Lipid Panel    Component Value Date/Time   CHOL 122 12/02/2016 1220   TRIG 86 12/02/2016 1220   HDL 54 12/02/2016 1220   CHOLHDL 2.3 12/02/2016 1220   CHOLHDL 1.9 11/27/2015 1042   VLDL 11 11/27/2015 1042   LDLCALC 51 12/02/2016 1220    Physical Exam:    VS:  BP 114/62   Pulse 97   Ht 5' 4"  (1.626 m)   Wt 170 lb 9.6 oz (77.4 kg)   SpO2 97%   BMI 29.28 kg/m     Wt Readings from Last 3 Encounters:  01/06/18 170 lb 9.6 oz (77.4 kg)  11/24/17 179 lb (81.2 kg)  10/08/17 175 lb (79.4 kg)     GEN:  Well nourished, well developed in no acute distress HEENT: Normal NECK: No JVD; No carotid bruits LYMPHATICS: No lymphadenopathy CARDIAC: RRR, no murmurs, rubs, gallops RESPIRATORY:  Clear to auscultation without rales, wheezing or rhonchi  ABDOMEN: Soft, non-tender, non-distended MUSCULOSKELETAL: Trace edema; No deformity  SKIN: Warm and dry NEUROLOGIC:  Alert and oriented x 3 PSYCHIATRIC:  Normal affect   ASSESSMENT:    1. Coronary artery disease of native artery of native heart with stable angina pectoris ([place])   2. Benign essential HTN   3. Dilated cardiomyopathy ([place])   4. Chronic diastolic CHF (congestive heart failure) ([place])   5. Pulmonary HTN ([place])   6. Pure hypercholesterolemia    PLAN:    In order of  problems listed above:  1.  ASCAD - s/p DES to LAD with residual disease of the diag too small for PCI by cath 01/2010 and then repeat cath with restenosis of the LADs/p PCI 10/2010.  She has chronic stable angina which is been very stable without any increase in severity or  frequency of her anginal episodes.  She will continue on  Imdur 90 mg daily and simvastatin.  She was taken off aspirin and Plavix because she was started on a DOAC for upper extremity DVT.  She will be done her Eliquis at the end of June and will restart Plavix.  2.  Hypertension -BP is well controlled on exam today.  Her carvedilol and ramipril were stopped due to hypotension in the hospital.  Her blood pressures been soft since then and these have not been restarted.  PCP wants to avoid ACE inhibitors or arms going forward due to chronic kidney disease.  Her GFR is been in the low 30s.  3.  Dilated cardiomyopathy - LV function normalized after CRT-P with echo 12/01/2017 showing normal LV function with EF 55 to 60%.  She is off ACE inhibitor and beta-blocker due to soft blood pressure.  4.  Chronic diastolic heart failure - her weight is stable and actually has decreased 14 pounds since November and lungs are clear but still has trace lower extremity edema.  Her creatinine was stable at 1.25 on 12/01/2017 and potassium was 4.1.  She still has some mild lower extremity edema but improved and will continue on current dose of Lasix.  5.  Pulmonary hypertension -echo 1/49/7026 showed PA systolic pressure was 40 mmHg.  This is likely secondary to group 2 pulmonary hypertension secondary to pulmonary venous hypertension from LV diastolic heart failure.  Continue diuretic therapy.  6.  Hyperlipidemia with LDL goal less than 70.  She will continue on simvastatin 20 mg daily.  We will check an FLP.  Her ALT was normal at 14 on 10/06/2017.   Medication Adjustments/Labs and Tests Ordered: Current medicines are reviewed at length with the patient today.  Concerns regarding medicines are outlined above.  No orders of the defined types were placed in this encounter.  No orders of the defined types were placed in this encounter.   Signed, Fransico Him, MD  01/06/2018 1:36 PM    [place]

## 2018-01-08 DIAGNOSIS — R2681 Unsteadiness on feet: Secondary | ICD-10-CM | POA: Diagnosis not present

## 2018-01-08 DIAGNOSIS — R278 Other lack of coordination: Secondary | ICD-10-CM | POA: Diagnosis not present

## 2018-01-08 DIAGNOSIS — R2689 Other abnormalities of gait and mobility: Secondary | ICD-10-CM | POA: Diagnosis not present

## 2018-01-08 DIAGNOSIS — M6281 Muscle weakness (generalized): Secondary | ICD-10-CM | POA: Diagnosis not present

## 2018-01-08 DIAGNOSIS — Z9181 History of falling: Secondary | ICD-10-CM | POA: Diagnosis not present

## 2018-01-08 DIAGNOSIS — R262 Difficulty in walking, not elsewhere classified: Secondary | ICD-10-CM | POA: Diagnosis not present

## 2018-01-11 DIAGNOSIS — R278 Other lack of coordination: Secondary | ICD-10-CM | POA: Diagnosis not present

## 2018-01-11 DIAGNOSIS — R2689 Other abnormalities of gait and mobility: Secondary | ICD-10-CM | POA: Diagnosis not present

## 2018-01-11 DIAGNOSIS — R262 Difficulty in walking, not elsewhere classified: Secondary | ICD-10-CM | POA: Diagnosis not present

## 2018-01-11 DIAGNOSIS — Z9181 History of falling: Secondary | ICD-10-CM | POA: Diagnosis not present

## 2018-01-11 DIAGNOSIS — R2681 Unsteadiness on feet: Secondary | ICD-10-CM | POA: Diagnosis not present

## 2018-01-11 DIAGNOSIS — M6281 Muscle weakness (generalized): Secondary | ICD-10-CM | POA: Diagnosis not present

## 2018-01-12 DIAGNOSIS — Z9181 History of falling: Secondary | ICD-10-CM | POA: Diagnosis not present

## 2018-01-12 DIAGNOSIS — R2681 Unsteadiness on feet: Secondary | ICD-10-CM | POA: Diagnosis not present

## 2018-01-12 DIAGNOSIS — R278 Other lack of coordination: Secondary | ICD-10-CM | POA: Diagnosis not present

## 2018-01-12 DIAGNOSIS — M6281 Muscle weakness (generalized): Secondary | ICD-10-CM | POA: Diagnosis not present

## 2018-01-12 DIAGNOSIS — R2689 Other abnormalities of gait and mobility: Secondary | ICD-10-CM | POA: Diagnosis not present

## 2018-01-12 DIAGNOSIS — R262 Difficulty in walking, not elsewhere classified: Secondary | ICD-10-CM | POA: Diagnosis not present

## 2018-01-13 ENCOUNTER — Other Ambulatory Visit: Payer: Medicare Other | Admitting: *Deleted

## 2018-01-13 ENCOUNTER — Other Ambulatory Visit: Payer: Self-pay | Admitting: Cardiology

## 2018-01-13 DIAGNOSIS — R278 Other lack of coordination: Secondary | ICD-10-CM | POA: Diagnosis not present

## 2018-01-13 DIAGNOSIS — Z9181 History of falling: Secondary | ICD-10-CM | POA: Diagnosis not present

## 2018-01-13 DIAGNOSIS — R2681 Unsteadiness on feet: Secondary | ICD-10-CM | POA: Diagnosis not present

## 2018-01-13 DIAGNOSIS — M6281 Muscle weakness (generalized): Secondary | ICD-10-CM | POA: Diagnosis not present

## 2018-01-13 DIAGNOSIS — R262 Difficulty in walking, not elsewhere classified: Secondary | ICD-10-CM | POA: Diagnosis not present

## 2018-01-13 DIAGNOSIS — R2689 Other abnormalities of gait and mobility: Secondary | ICD-10-CM | POA: Diagnosis not present

## 2018-01-13 DIAGNOSIS — E7800 Pure hypercholesterolemia, unspecified: Secondary | ICD-10-CM | POA: Diagnosis not present

## 2018-01-13 LAB — LIPID PANEL
Chol/HDL Ratio: 2 ratio (ref 0.0–4.4)
Cholesterol, Total: 120 mg/dL (ref 100–199)
HDL: 61 mg/dL (ref >39)
LDL Calculated: 42 mg/dL (ref 0–99)
Triglycerides: 83 mg/dL (ref 0–149)
VLDL Cholesterol Cal: 17 mg/dL (ref 5–40)

## 2018-01-14 DIAGNOSIS — Z9181 History of falling: Secondary | ICD-10-CM | POA: Diagnosis not present

## 2018-01-14 DIAGNOSIS — R262 Difficulty in walking, not elsewhere classified: Secondary | ICD-10-CM | POA: Diagnosis not present

## 2018-01-14 DIAGNOSIS — R2689 Other abnormalities of gait and mobility: Secondary | ICD-10-CM | POA: Diagnosis not present

## 2018-01-14 DIAGNOSIS — R278 Other lack of coordination: Secondary | ICD-10-CM | POA: Diagnosis not present

## 2018-01-14 DIAGNOSIS — R2681 Unsteadiness on feet: Secondary | ICD-10-CM | POA: Diagnosis not present

## 2018-01-14 DIAGNOSIS — M6281 Muscle weakness (generalized): Secondary | ICD-10-CM | POA: Diagnosis not present

## 2018-01-15 DIAGNOSIS — R278 Other lack of coordination: Secondary | ICD-10-CM | POA: Diagnosis not present

## 2018-01-15 DIAGNOSIS — Z9181 History of falling: Secondary | ICD-10-CM | POA: Diagnosis not present

## 2018-01-15 DIAGNOSIS — R2689 Other abnormalities of gait and mobility: Secondary | ICD-10-CM | POA: Diagnosis not present

## 2018-01-15 DIAGNOSIS — R262 Difficulty in walking, not elsewhere classified: Secondary | ICD-10-CM | POA: Diagnosis not present

## 2018-01-15 DIAGNOSIS — R2681 Unsteadiness on feet: Secondary | ICD-10-CM | POA: Diagnosis not present

## 2018-01-15 DIAGNOSIS — M6281 Muscle weakness (generalized): Secondary | ICD-10-CM | POA: Diagnosis not present

## 2018-01-18 DIAGNOSIS — R2681 Unsteadiness on feet: Secondary | ICD-10-CM | POA: Diagnosis not present

## 2018-01-18 DIAGNOSIS — M6281 Muscle weakness (generalized): Secondary | ICD-10-CM | POA: Diagnosis not present

## 2018-01-18 DIAGNOSIS — R262 Difficulty in walking, not elsewhere classified: Secondary | ICD-10-CM | POA: Diagnosis not present

## 2018-01-18 DIAGNOSIS — Z9181 History of falling: Secondary | ICD-10-CM | POA: Diagnosis not present

## 2018-01-18 DIAGNOSIS — R2689 Other abnormalities of gait and mobility: Secondary | ICD-10-CM | POA: Diagnosis not present

## 2018-01-18 DIAGNOSIS — R278 Other lack of coordination: Secondary | ICD-10-CM | POA: Diagnosis not present

## 2018-01-20 DIAGNOSIS — M1711 Unilateral primary osteoarthritis, right knee: Secondary | ICD-10-CM | POA: Diagnosis not present

## 2018-01-20 DIAGNOSIS — E538 Deficiency of other specified B group vitamins: Secondary | ICD-10-CM | POA: Diagnosis not present

## 2018-01-21 DIAGNOSIS — R2681 Unsteadiness on feet: Secondary | ICD-10-CM | POA: Diagnosis not present

## 2018-01-21 DIAGNOSIS — R278 Other lack of coordination: Secondary | ICD-10-CM | POA: Diagnosis not present

## 2018-01-21 DIAGNOSIS — R2689 Other abnormalities of gait and mobility: Secondary | ICD-10-CM | POA: Diagnosis not present

## 2018-01-21 DIAGNOSIS — R262 Difficulty in walking, not elsewhere classified: Secondary | ICD-10-CM | POA: Diagnosis not present

## 2018-01-21 DIAGNOSIS — M6281 Muscle weakness (generalized): Secondary | ICD-10-CM | POA: Diagnosis not present

## 2018-01-21 DIAGNOSIS — Z9181 History of falling: Secondary | ICD-10-CM | POA: Diagnosis not present

## 2018-01-27 DIAGNOSIS — R2681 Unsteadiness on feet: Secondary | ICD-10-CM | POA: Diagnosis not present

## 2018-01-27 DIAGNOSIS — Z9181 History of falling: Secondary | ICD-10-CM | POA: Diagnosis not present

## 2018-01-27 DIAGNOSIS — R278 Other lack of coordination: Secondary | ICD-10-CM | POA: Diagnosis not present

## 2018-01-27 DIAGNOSIS — M6281 Muscle weakness (generalized): Secondary | ICD-10-CM | POA: Diagnosis not present

## 2018-01-27 DIAGNOSIS — R262 Difficulty in walking, not elsewhere classified: Secondary | ICD-10-CM | POA: Diagnosis not present

## 2018-01-27 DIAGNOSIS — R2689 Other abnormalities of gait and mobility: Secondary | ICD-10-CM | POA: Diagnosis not present

## 2018-01-29 DIAGNOSIS — F322 Major depressive disorder, single episode, severe without psychotic features: Secondary | ICD-10-CM | POA: Diagnosis not present

## 2018-01-29 DIAGNOSIS — Z853 Personal history of malignant neoplasm of breast: Secondary | ICD-10-CM | POA: Diagnosis not present

## 2018-01-29 DIAGNOSIS — R2681 Unsteadiness on feet: Secondary | ICD-10-CM | POA: Diagnosis not present

## 2018-01-29 DIAGNOSIS — R2689 Other abnormalities of gait and mobility: Secondary | ICD-10-CM | POA: Diagnosis not present

## 2018-01-29 DIAGNOSIS — F325 Major depressive disorder, single episode, in full remission: Secondary | ICD-10-CM | POA: Diagnosis not present

## 2018-01-29 DIAGNOSIS — M179 Osteoarthritis of knee, unspecified: Secondary | ICD-10-CM | POA: Diagnosis not present

## 2018-01-29 DIAGNOSIS — I5020 Unspecified systolic (congestive) heart failure: Secondary | ICD-10-CM | POA: Diagnosis not present

## 2018-01-29 DIAGNOSIS — I209 Angina pectoris, unspecified: Secondary | ICD-10-CM | POA: Diagnosis not present

## 2018-01-29 DIAGNOSIS — R262 Difficulty in walking, not elsewhere classified: Secondary | ICD-10-CM | POA: Diagnosis not present

## 2018-01-29 DIAGNOSIS — R278 Other lack of coordination: Secondary | ICD-10-CM | POA: Diagnosis not present

## 2018-01-29 DIAGNOSIS — I2510 Atherosclerotic heart disease of native coronary artery without angina pectoris: Secondary | ICD-10-CM | POA: Diagnosis not present

## 2018-01-29 DIAGNOSIS — I255 Ischemic cardiomyopathy: Secondary | ICD-10-CM | POA: Diagnosis not present

## 2018-01-29 DIAGNOSIS — M6281 Muscle weakness (generalized): Secondary | ICD-10-CM | POA: Diagnosis not present

## 2018-01-29 DIAGNOSIS — I208 Other forms of angina pectoris: Secondary | ICD-10-CM | POA: Diagnosis not present

## 2018-01-29 DIAGNOSIS — M1990 Unspecified osteoarthritis, unspecified site: Secondary | ICD-10-CM | POA: Diagnosis not present

## 2018-01-29 DIAGNOSIS — N183 Chronic kidney disease, stage 3 (moderate): Secondary | ICD-10-CM | POA: Diagnosis not present

## 2018-01-29 DIAGNOSIS — Z9181 History of falling: Secondary | ICD-10-CM | POA: Diagnosis not present

## 2018-02-09 ENCOUNTER — Other Ambulatory Visit: Payer: Self-pay | Admitting: Cardiology

## 2018-02-09 DIAGNOSIS — R262 Difficulty in walking, not elsewhere classified: Secondary | ICD-10-CM | POA: Diagnosis not present

## 2018-02-09 DIAGNOSIS — R2689 Other abnormalities of gait and mobility: Secondary | ICD-10-CM | POA: Diagnosis not present

## 2018-02-09 DIAGNOSIS — M6281 Muscle weakness (generalized): Secondary | ICD-10-CM | POA: Diagnosis not present

## 2018-02-09 DIAGNOSIS — R2681 Unsteadiness on feet: Secondary | ICD-10-CM | POA: Diagnosis not present

## 2018-02-12 DIAGNOSIS — R2681 Unsteadiness on feet: Secondary | ICD-10-CM | POA: Diagnosis not present

## 2018-02-12 DIAGNOSIS — M6281 Muscle weakness (generalized): Secondary | ICD-10-CM | POA: Diagnosis not present

## 2018-02-12 DIAGNOSIS — R262 Difficulty in walking, not elsewhere classified: Secondary | ICD-10-CM | POA: Diagnosis not present

## 2018-02-12 DIAGNOSIS — R2689 Other abnormalities of gait and mobility: Secondary | ICD-10-CM | POA: Diagnosis not present

## 2018-02-16 ENCOUNTER — Other Ambulatory Visit: Payer: Self-pay | Admitting: Cardiology

## 2018-02-16 NOTE — Telephone Encounter (Signed)
Pt's pharmacy is requesting a refill on clopidogrel and I do not see where Dr. Caryl Comes D/C this medication. It is no longer on pt's med list. Please address

## 2018-02-16 NOTE — Telephone Encounter (Signed)
Please clarify if pt should be started back on plavix. Please address

## 2018-02-16 NOTE — Telephone Encounter (Signed)
In Dr Theodosia Blender last note, she stated pt was to begin plavix when she has completed her course of DOAC's in June. Please clarify with Dr Radford Pax.    "1.  ASCAD - s/p DES to LAD with residual disease of the diag too small for PCI by cath 01/2010 and then repeat cath with restenosis of the LADs/p PCI 10/2010.  She has chronic stable angina which is been very stable without any increase in severity or frequency of her anginal episodes.  She will continue on  Imdur 90 mg daily and simvastatin.  She was taken off aspirin and Plavix because she was started on a DOAC for upper extremity DVT.  She will be done her Eliquis at the end of June and will restart Plavix."

## 2018-02-17 DIAGNOSIS — E538 Deficiency of other specified B group vitamins: Secondary | ICD-10-CM | POA: Diagnosis not present

## 2018-02-23 DIAGNOSIS — M1990 Unspecified osteoarthritis, unspecified site: Secondary | ICD-10-CM | POA: Diagnosis not present

## 2018-02-23 DIAGNOSIS — F325 Major depressive disorder, single episode, in full remission: Secondary | ICD-10-CM | POA: Diagnosis not present

## 2018-02-23 DIAGNOSIS — Z853 Personal history of malignant neoplasm of breast: Secondary | ICD-10-CM | POA: Diagnosis not present

## 2018-02-23 DIAGNOSIS — N183 Chronic kidney disease, stage 3 (moderate): Secondary | ICD-10-CM | POA: Diagnosis not present

## 2018-02-23 DIAGNOSIS — I255 Ischemic cardiomyopathy: Secondary | ICD-10-CM | POA: Diagnosis not present

## 2018-02-23 DIAGNOSIS — I208 Other forms of angina pectoris: Secondary | ICD-10-CM | POA: Diagnosis not present

## 2018-02-23 DIAGNOSIS — M179 Osteoarthritis of knee, unspecified: Secondary | ICD-10-CM | POA: Diagnosis not present

## 2018-02-23 DIAGNOSIS — F322 Major depressive disorder, single episode, severe without psychotic features: Secondary | ICD-10-CM | POA: Diagnosis not present

## 2018-02-23 DIAGNOSIS — I2510 Atherosclerotic heart disease of native coronary artery without angina pectoris: Secondary | ICD-10-CM | POA: Diagnosis not present

## 2018-02-23 DIAGNOSIS — I5020 Unspecified systolic (congestive) heart failure: Secondary | ICD-10-CM | POA: Diagnosis not present

## 2018-02-23 DIAGNOSIS — I209 Angina pectoris, unspecified: Secondary | ICD-10-CM | POA: Diagnosis not present

## 2018-03-04 ENCOUNTER — Other Ambulatory Visit: Payer: Self-pay | Admitting: Cardiology

## 2018-03-10 ENCOUNTER — Other Ambulatory Visit: Payer: Self-pay | Admitting: Cardiology

## 2018-03-18 DIAGNOSIS — E538 Deficiency of other specified B group vitamins: Secondary | ICD-10-CM | POA: Diagnosis not present

## 2018-04-14 DIAGNOSIS — Z23 Encounter for immunization: Secondary | ICD-10-CM | POA: Diagnosis not present

## 2018-04-14 DIAGNOSIS — E538 Deficiency of other specified B group vitamins: Secondary | ICD-10-CM | POA: Diagnosis not present

## 2018-04-30 DIAGNOSIS — N183 Chronic kidney disease, stage 3 (moderate): Secondary | ICD-10-CM | POA: Diagnosis not present

## 2018-04-30 DIAGNOSIS — F322 Major depressive disorder, single episode, severe without psychotic features: Secondary | ICD-10-CM | POA: Diagnosis not present

## 2018-04-30 DIAGNOSIS — Z853 Personal history of malignant neoplasm of breast: Secondary | ICD-10-CM | POA: Diagnosis not present

## 2018-04-30 DIAGNOSIS — I208 Other forms of angina pectoris: Secondary | ICD-10-CM | POA: Diagnosis not present

## 2018-04-30 DIAGNOSIS — I5020 Unspecified systolic (congestive) heart failure: Secondary | ICD-10-CM | POA: Diagnosis not present

## 2018-04-30 DIAGNOSIS — F325 Major depressive disorder, single episode, in full remission: Secondary | ICD-10-CM | POA: Diagnosis not present

## 2018-04-30 DIAGNOSIS — M1990 Unspecified osteoarthritis, unspecified site: Secondary | ICD-10-CM | POA: Diagnosis not present

## 2018-04-30 DIAGNOSIS — I2510 Atherosclerotic heart disease of native coronary artery without angina pectoris: Secondary | ICD-10-CM | POA: Diagnosis not present

## 2018-05-11 DIAGNOSIS — E538 Deficiency of other specified B group vitamins: Secondary | ICD-10-CM | POA: Diagnosis not present

## 2018-05-24 ENCOUNTER — Ambulatory Visit (INDEPENDENT_AMBULATORY_CARE_PROVIDER_SITE_OTHER): Payer: Medicare Other | Admitting: *Deleted

## 2018-05-24 DIAGNOSIS — Z950 Presence of cardiac pacemaker: Secondary | ICD-10-CM

## 2018-05-24 DIAGNOSIS — I447 Left bundle-branch block, unspecified: Secondary | ICD-10-CM | POA: Diagnosis not present

## 2018-05-24 DIAGNOSIS — I5022 Chronic systolic (congestive) heart failure: Secondary | ICD-10-CM | POA: Diagnosis not present

## 2018-05-24 LAB — CUP PACEART INCLINIC DEVICE CHECK
Battery Remaining Longevity: 40 mo
Battery Voltage: 2.97 V
Brady Statistic AP VP Percent: 0.02 %
Brady Statistic AP VS Percent: 0 %
Brady Statistic AS VP Percent: 99.93 %
Brady Statistic AS VS Percent: 0.06 %
Brady Statistic RA Percent Paced: 0.02 %
Brady Statistic RV Percent Paced: 97.36 %
Date Time Interrogation Session: 20191021160935
Implantable Lead Implant Date: 20131024
Implantable Lead Implant Date: 20131024
Implantable Lead Implant Date: 20131028
Implantable Lead Location: 753858
Implantable Lead Location: 753859
Implantable Lead Location: 753860
Implantable Lead Model: 1944
Implantable Lead Model: 1948
Implantable Lead Model: 4194
Implantable Pulse Generator Implant Date: 20131024
Lead Channel Impedance Value: 304 Ohm
Lead Channel Impedance Value: 456 Ohm
Lead Channel Impedance Value: 456 Ohm
Lead Channel Impedance Value: 475 Ohm
Lead Channel Impedance Value: 513 Ohm
Lead Channel Impedance Value: 532 Ohm
Lead Channel Impedance Value: 608 Ohm
Lead Channel Impedance Value: 627 Ohm
Lead Channel Impedance Value: 665 Ohm
Lead Channel Pacing Threshold Amplitude: 0.5 V
Lead Channel Pacing Threshold Amplitude: 1 V
Lead Channel Pacing Threshold Amplitude: 1 V
Lead Channel Pacing Threshold Pulse Width: 0.4 ms
Lead Channel Pacing Threshold Pulse Width: 0.4 ms
Lead Channel Pacing Threshold Pulse Width: 0.4 ms
Lead Channel Sensing Intrinsic Amplitude: 2.375 mV
Lead Channel Sensing Intrinsic Amplitude: 5.75 mV
Lead Channel Setting Pacing Amplitude: 2 V
Lead Channel Setting Pacing Amplitude: 2 V
Lead Channel Setting Pacing Amplitude: 2.5 V
Lead Channel Setting Pacing Pulse Width: 0.4 ms
Lead Channel Setting Pacing Pulse Width: 0.4 ms
Lead Channel Setting Sensing Sensitivity: 2.8 mV

## 2018-05-24 NOTE — Progress Notes (Signed)
CRT-P device check in clinic. Normal device function. Thresholds, sensing, impedance consistent with previous measurements. Histograms appropriate for patient and level of activity. No mode switches. 6 ventricular high rate episodes--1:1 and NSVT per EGMs, longest 2 sec. Patient bi-ventricularly pacing 97.4% of the time. Device programmed with appropriate safety margins. Thoracic impedance currently at baseline, some instability noted over time. Estimated longevity 3 years (2.5-4 years). Patient declines Carelink monitoring. ROV with SK on 11/23/18.

## 2018-06-08 DIAGNOSIS — E538 Deficiency of other specified B group vitamins: Secondary | ICD-10-CM | POA: Diagnosis not present

## 2018-07-06 DIAGNOSIS — E538 Deficiency of other specified B group vitamins: Secondary | ICD-10-CM | POA: Diagnosis not present

## 2018-08-09 DIAGNOSIS — I255 Ischemic cardiomyopathy: Secondary | ICD-10-CM | POA: Diagnosis not present

## 2018-08-09 DIAGNOSIS — Z950 Presence of cardiac pacemaker: Secondary | ICD-10-CM | POA: Diagnosis not present

## 2018-08-09 DIAGNOSIS — I5020 Unspecified systolic (congestive) heart failure: Secondary | ICD-10-CM | POA: Diagnosis not present

## 2018-08-09 DIAGNOSIS — I208 Other forms of angina pectoris: Secondary | ICD-10-CM | POA: Diagnosis not present

## 2018-08-09 DIAGNOSIS — E538 Deficiency of other specified B group vitamins: Secondary | ICD-10-CM | POA: Diagnosis not present

## 2018-08-09 DIAGNOSIS — N183 Chronic kidney disease, stage 3 (moderate): Secondary | ICD-10-CM | POA: Diagnosis not present

## 2018-08-09 DIAGNOSIS — H3530 Unspecified macular degeneration: Secondary | ICD-10-CM | POA: Diagnosis not present

## 2018-08-09 DIAGNOSIS — M859 Disorder of bone density and structure, unspecified: Secondary | ICD-10-CM | POA: Diagnosis not present

## 2018-08-09 DIAGNOSIS — M1990 Unspecified osteoarthritis, unspecified site: Secondary | ICD-10-CM | POA: Diagnosis not present

## 2018-08-09 DIAGNOSIS — M8580 Other specified disorders of bone density and structure, unspecified site: Secondary | ICD-10-CM | POA: Diagnosis not present

## 2018-08-09 DIAGNOSIS — I2510 Atherosclerotic heart disease of native coronary artery without angina pectoris: Secondary | ICD-10-CM | POA: Diagnosis not present

## 2018-08-09 DIAGNOSIS — H353210 Exudative age-related macular degeneration, right eye, stage unspecified: Secondary | ICD-10-CM | POA: Diagnosis not present

## 2018-08-17 ENCOUNTER — Ambulatory Visit (INDEPENDENT_AMBULATORY_CARE_PROVIDER_SITE_OTHER): Payer: Medicare Other | Admitting: Medical

## 2018-08-17 ENCOUNTER — Encounter: Payer: Self-pay | Admitting: Cardiology

## 2018-08-17 ENCOUNTER — Encounter (INDEPENDENT_AMBULATORY_CARE_PROVIDER_SITE_OTHER): Payer: Self-pay

## 2018-08-17 VITALS — BP 122/74 | HR 85 | Ht 65.0 in | Wt 168.8 lb

## 2018-08-17 DIAGNOSIS — I420 Dilated cardiomyopathy: Secondary | ICD-10-CM

## 2018-08-17 DIAGNOSIS — I10 Essential (primary) hypertension: Secondary | ICD-10-CM

## 2018-08-17 DIAGNOSIS — E7800 Pure hypercholesterolemia, unspecified: Secondary | ICD-10-CM | POA: Diagnosis not present

## 2018-08-17 DIAGNOSIS — I25118 Atherosclerotic heart disease of native coronary artery with other forms of angina pectoris: Secondary | ICD-10-CM | POA: Diagnosis not present

## 2018-08-17 DIAGNOSIS — I5032 Chronic diastolic (congestive) heart failure: Secondary | ICD-10-CM | POA: Diagnosis not present

## 2018-08-17 NOTE — Progress Notes (Signed)
Cardiology Office Note   Date:  08/17/2018   ID:  [name], DOB May 19, 1923, MRN [id number]  PCP:  Monica Arabian, MD  Cardiologist:  Monica Him, MD EP: None  Chief Complaint  Patient presents with  . Follow-up    CAD and CHF      History of Present Illness: [name] is a 85 y.o. female with PMH of CAD s/p PCI/DES to LAD with residual disease in a small diagonal on cath 2011 with repeat cath in 2012 with restenosis managed with PCI to LAD, ischemic cardiomyopathy with normalization of EF after CRT-P upgrade (EF 55-60% on Echo 11/2017), chronic diastolic CHF, LBBB, CHB s/p PPM with upgrade to CRT-P, pulmonary HTN, HTN, and HLD, who presents for routine follow-up.  She was last seen by cardiology, Dr. Radford Chang, 01/2018 at which time she was doing well from a cardiac standpoint. She had chronic stable angina, DOE, and LE edema. No medication changes occurred at that visit. She had a recent diagnosis of an upper extremity DVT managed with eliquis and transitioned back to plavix after completion of DOAC course.   She presents today for routine follow-up with her daughter. She reports doing quite well since she saw Dr. Radford Chang last. She met her 3rd great grandchild over Christmas. She reports some mild worsening of her LE edema but she attributes this to increased salt intake as she ate salted peanuts and popcorn at the movies recently. Otherwise she has no cardiac complaints - no chest pain, SOB, DOE, orthopnea, PND, dizziness, lightheadedness, syncope, or bleeding.    Past Medical History:  Diagnosis Date  . Breast cancer ([place])    bilaterally  . CAD (coronary artery disease)    w/ DES to LAD with residual diagonal disease to small for PCI 6/11 and repeat DES to LAD for restenosis 3/12  . Cardiomyopathy ([place])    Mixed CM; s/p St. Jude CRT-P implant 10/24 w/ LV lead revision 10/25 . Echo 11-2017 EF 55%.    . Chronic diastolic (congestive) heart failure ([place])   . Dyslipidemia,  goal LDL below 70   . GERD (gastroesophageal reflux disease)    occ  . Hematuria 02/23/2013   w/clots required hospital admission on 02/23/2013  . Hypertension   . LBBB (left bundle branch block)   . Macular degeneration   . Mild mitral regurgitation   . Osteoarthritis    "right knee" (02/23/2013)  . Osteopenia   . Pacemaker   . Pulmonary HTN (HCC)    PASP 32mHg by echo 11/2017  . Umbilical hernia    Repaired on 01/10/2013   . Vitamin B12 deficiency     Past Surgical History:  Procedure Laterality Date  . BI-VENTRICULAR PACEMAKER INSERTION N/A 05/27/2012   Procedure: BI-VENTRICULAR PACEMAKER INSERTION (CRT-P);  Surgeon: SDeboraha Sprang MD;  [facility];  Service: Cardiovascular;  Laterality: N/A;  . BREAST BIOPSY  1995   bilaterally  . CATARACT EXTRACTION    . CATARACT EXTRACTION W/ INTRAOCULAR LENS  IMPLANT, BILATERAL  1990's  . CORONARY ANGIOPLASTY WITH STENT PLACEMENT  2010   "1" (05/27/2012)  . HERNIA REPAIR    . HIP ARTHROPLASTY Left 10/08/2017   Procedure: LEFT HIP HIMIARTHROPLASTY;  Surgeon: DMelrose Nakayama MD;  [facility]  Service: Orthopedics;  Laterality: Left;  . INSERT / REPLACE / REMOVE PACEMAKER  05/27/2012   initial placement - BiV PPM  . INSERT / REPLACE / REMOVE PACEMAKER  05/31/2012   "lead change/repaired" (  05/31/2012)  . INSERTION OF MESH N/A 01/10/2013   Procedure: INSERTION OF MESH;  Surgeon: Haywood Lasso, MD;  Location: [place];  Service: General;  Laterality: N/A;  . JOINT REPLACEMENT    . LEAD REVISION N/A 05/28/2012   Procedure: LEAD REVISION;  Surgeon: Evans Lance, MD;  Location[facility] LAB;  Service: Cardiovascular;  Laterality: N/A;  . LEAD REVISION Left 05/31/2012   Procedure: LEAD REVISION;  Surgeon: Deboraha Sprang, MD;  [facility];  Service: Cardiovascular;  Laterality: Left;  [name] MASTECTOMY  1995   bilaterally  . TONSILLECTOMY    . TOTAL HIP ARTHROPLASTY  2011   right  . UMBILICAL HERNIA REPAIR N/A 01/10/2013    Procedure: HERNIA REPAIR UMBILICAL ADULT;  Surgeon: Haywood Lasso, MD;  Location: [place];  Service: General;  Laterality: N/A;     Current Outpatient Medications  Medication Sig Dispense Refill  . acetaminophen (TYLENOL) 500 MG tablet Take 500-1,000 mg by mouth every 6 (six) hours as needed.    [name] aspirin EC 81 MG tablet Take 81 mg by mouth daily.    . Cholecalciferol (VITAMIN D-3) 5000 UNITS TABS Take 5,000 Units by mouth daily.     . clopidogrel (PLAVIX) 75 MG tablet TAKE ONE TABLET BY MOUTH ONCE DAILY 90 tablet 3  . Cyanocobalamin (B-12) 1000 MCG/ML KIT Inject 1,000 mcg as directed every 30 (thirty) days.    . furosemide (LASIX) 40 MG tablet TAKE ONE TABLET EACH DAY 30 tablet 11  . isosorbide mononitrate (IMDUR) 60 MG 24 hr tablet TAKE ONE AND ONE-HALF TABLETS DAILY 135 tablet 3  . Multiple Vitamin (MULTIVITAMIN WITH MINERALS) TABS tablet Take 1 tablet by mouth daily.    . nitroGLYCERIN (NITROSTAT) 0.4 MG SL tablet Place 1 tablet (0.4 mg total) every 5 (five) minutes as needed under the tongue for chest pain. 25 tablet 3  . omeprazole (PRILOSEC) 20 MG capsule Take 20 mg by mouth daily.    . simvastatin (ZOCOR) 20 MG tablet TAKE ONE TABLET EACH DAY AT 6PM 90 tablet 3  . vitamin C (ASCORBIC ACID) 500 MG tablet Take 500 mg by mouth daily.     No current facility-administered medications for this visit.     Allergies:   Altace [ramipril] and Celebrex [celecoxib]    Social History:  The patient  reports that she quit smoking about 55 years ago. Her smoking use included cigarettes. She started smoking about 75 years ago. She has a 20.00 pack-year smoking history. She has never used smokeless tobacco. She reports current alcohol use of about 5.0 standard drinks of alcohol per week. She reports that she does not use drugs.   Family History:  The patient's family history includes Asthma in her grandchild; Breast cancer in her daughter, maternal aunt, maternal aunt, and mother; Heart disease  in her father and mother.    ROS:  Please see the history of present illness. Otherwise, review of systems are positive for none.   All other systems are reviewed and negative.    PHYSICAL EXAM: VS:  BP 122/74   Pulse 85   Ht _0  (1.651 m)   Wt 168 lb 12.8 oz (76.6 kg)   SpO2 95%   BMI 28.09 kg/m  , BMI Body mass index is 28.09 kg/m. GEN: Well nourished, well developed, in no acute distress HEENT: sclera anicteric Neck: no JVD, carotid bruits, or masses Cardiac: RRR; no murmurs, rubs, or gallops, bilateral non-pitting LE edema Respiratory:  clear to auscultation bilaterally, normal work of breathing GI: soft, nontender, nondistended, + BS MS: no deformity or atrophy Skin: warm and dry, no rash Neuro:  Strength and sensation are intact Psych: euthymic mood, full affect   EKG:  EKG is not ordered today.   Recent Labs: 10/06/2017: ALT 14; B Natriuretic Peptide 161.0 10/11/2017: Magnesium 2.6 12/01/2017: BUN 22; Creatinine, Ser 1.25; Hemoglobin 10.8; Platelets 214; Potassium 4.1; Sodium 145    Lipid Panel    Component Value Date/Time   CHOL 120 01/13/2018 1107   TRIG 83 01/13/2018 1107   HDL 61 01/13/2018 1107   CHOLHDL 2.0 01/13/2018 1107   CHOLHDL 1.9 11/27/2015 1042   VLDL 11 11/27/2015 1042   LDLCALC 42 01/13/2018 1107      Wt Readings from Last 3 Encounters:  08/17/18 168 lb 12.8 oz (76.6 kg)  01/06/18 170 lb 9.6 oz (77.4 kg)  11/24/17 179 lb (81.2 kg)      Other studies Reviewed: Additional studies/ records that were reviewed today include:   Echocardiogram 11/2017: Study Conclusions  - Left ventricle: The cavity size was normal. Systolic function was   normal. The estimated ejection fraction was in the range of 55%   to 60%. Wall motion was normal; there were no regional wall   motion abnormalities. Doppler parameters are consistent with   abnormal left ventricular relaxation (grade 1 diastolic   dysfunction). Doppler parameters are consistent  with   indeterminate ventricular filling pressure. - Aortic valve: Transvalvular velocity was within the normal range.   There was no stenosis. There was no regurgitation. - Mitral valve: Transvalvular velocity was within the normal range.   There was no evidence for stenosis. There was mild regurgitation. - Left atrium: The atrium was moderately dilated. - Right ventricle: The cavity size was normal. Wall thickness was   normal. Systolic function was normal. - Tricuspid valve: There was mild regurgitation. - Pulmonary arteries: Systolic pressure was mildly increased. PA   peak pressure: 40 mm Hg (S).   ASSESSMENT AND PLAN:   1. CAD s/p PCI to LAD: Last cath in 2012 with restenosis of previously placed LAD stent managed with PCI/DES. She denies chest pain today. She is back on aspirin and plavix after DOAC course for upper extremity DVT.   - Continue aspirin, plavix, and statin  2. HTN: BP well controlled today. Not on medications due to soft blood pressures (previously on carvedilol and ramipril) - Will continue to monitor and consider medications in the future if BP becomes elevated  3. Chronic diastolic CHF: weight is stable from previous visit. She notes some mild worsening of non-pitting LE edema but reported this is likely due to increased salt intake as she ate some salted peanuts and popcorn recently. Cr stable at 1.6 08/09/18.  - Continue lasix 26m daily  4. HLD: LDL 57 08/09/2018 - Continue atorvastatin  5. Dilated cardiomyopathy: EF normalized on echo 11/2017 after CRT-P upgrade by Dr. KCaryl Comes- Continue follow-up with Dr. KCaryl Comes   Current medicines are reviewed at length with the patient today.  The patient does not have concerns regarding medicines.  The following changes have been made:  no change  Labs/ tests ordered today include: None No orders of the defined types were placed in this encounter.    Disposition:   FU with Dr. TRadford Paxin 6 months  Signed, KAbigail Butts PA-C  08/17/2018 2:20 PM

## 2018-08-17 NOTE — Patient Instructions (Signed)
Medication Instructions:  NONE If you need a refill on your cardiac medications before your next appointment, please call your pharmacy.   Lab work: NONE If you have labs (blood work) drawn today and your tests are completely normal, you will receive your results only by: . MyChart Message (if you have MyChart) OR . A paper copy in the mail If you have any lab test that is abnormal or we need to change your treatment, we will call you to review the results.  Testing/Procedures: NONE  Follow-Up: At [facility], you and your health needs are our priority.  As part of our continuing mission to provide you with exceptional heart care, we have created designated Provider Care Teams.  These Care Teams include your primary Cardiologist (physician) and Advanced Practice Providers (APPs -  Physician Assistants and Nurse Practitioners) who all work together to provide you with the care you need, when you need it. You will need a follow up appointment in 6 months.  Please call our office 2 months in advance to schedule this appointment.  You may see Traci Turner, MD or one of the following Advanced Practice Providers on your designated Care Team:   Brittainy Simmons, PA-C Dayna Dunn, PA-C . Michele Lenze, PA-C  Any Other Special Instructions Will Be Listed Below (If Applicable).    

## 2018-08-19 ENCOUNTER — Ambulatory Visit: Payer: Medicare Other | Admitting: Physician Assistant

## 2018-09-06 DIAGNOSIS — E538 Deficiency of other specified B group vitamins: Secondary | ICD-10-CM | POA: Diagnosis not present

## 2018-10-04 DIAGNOSIS — E538 Deficiency of other specified B group vitamins: Secondary | ICD-10-CM | POA: Diagnosis not present

## 2018-11-23 ENCOUNTER — Telehealth: Payer: Medicare Other | Admitting: Internal Medicine

## 2019-01-06 ENCOUNTER — Other Ambulatory Visit: Payer: Self-pay | Admitting: Internal Medicine

## 2019-02-14 ENCOUNTER — Other Ambulatory Visit: Payer: Self-pay | Admitting: Cardiology

## 2019-02-15 DIAGNOSIS — M6281 Muscle weakness (generalized): Secondary | ICD-10-CM | POA: Diagnosis not present

## 2019-02-15 DIAGNOSIS — R2689 Other abnormalities of gait and mobility: Secondary | ICD-10-CM | POA: Diagnosis not present

## 2019-02-15 DIAGNOSIS — R262 Difficulty in walking, not elsewhere classified: Secondary | ICD-10-CM | POA: Diagnosis not present

## 2019-02-15 DIAGNOSIS — R2681 Unsteadiness on feet: Secondary | ICD-10-CM | POA: Diagnosis not present

## 2019-02-16 DIAGNOSIS — R2689 Other abnormalities of gait and mobility: Secondary | ICD-10-CM | POA: Diagnosis not present

## 2019-02-16 DIAGNOSIS — R2681 Unsteadiness on feet: Secondary | ICD-10-CM | POA: Diagnosis not present

## 2019-02-16 DIAGNOSIS — R262 Difficulty in walking, not elsewhere classified: Secondary | ICD-10-CM | POA: Diagnosis not present

## 2019-02-16 DIAGNOSIS — M6281 Muscle weakness (generalized): Secondary | ICD-10-CM | POA: Diagnosis not present

## 2019-02-18 DIAGNOSIS — R262 Difficulty in walking, not elsewhere classified: Secondary | ICD-10-CM | POA: Diagnosis not present

## 2019-02-18 DIAGNOSIS — M6281 Muscle weakness (generalized): Secondary | ICD-10-CM | POA: Diagnosis not present

## 2019-02-18 DIAGNOSIS — R2689 Other abnormalities of gait and mobility: Secondary | ICD-10-CM | POA: Diagnosis not present

## 2019-02-18 DIAGNOSIS — R2681 Unsteadiness on feet: Secondary | ICD-10-CM | POA: Diagnosis not present

## 2019-03-01 DIAGNOSIS — M6281 Muscle weakness (generalized): Secondary | ICD-10-CM | POA: Diagnosis not present

## 2019-03-01 DIAGNOSIS — R2681 Unsteadiness on feet: Secondary | ICD-10-CM | POA: Diagnosis not present

## 2019-03-01 DIAGNOSIS — R2689 Other abnormalities of gait and mobility: Secondary | ICD-10-CM | POA: Diagnosis not present

## 2019-03-01 DIAGNOSIS — R262 Difficulty in walking, not elsewhere classified: Secondary | ICD-10-CM | POA: Diagnosis not present

## 2019-03-02 DIAGNOSIS — R2681 Unsteadiness on feet: Secondary | ICD-10-CM | POA: Diagnosis not present

## 2019-03-02 DIAGNOSIS — R2689 Other abnormalities of gait and mobility: Secondary | ICD-10-CM | POA: Diagnosis not present

## 2019-03-02 DIAGNOSIS — R262 Difficulty in walking, not elsewhere classified: Secondary | ICD-10-CM | POA: Diagnosis not present

## 2019-03-02 DIAGNOSIS — M6281 Muscle weakness (generalized): Secondary | ICD-10-CM | POA: Diagnosis not present

## 2019-03-04 DIAGNOSIS — R262 Difficulty in walking, not elsewhere classified: Secondary | ICD-10-CM | POA: Diagnosis not present

## 2019-03-04 DIAGNOSIS — R2681 Unsteadiness on feet: Secondary | ICD-10-CM | POA: Diagnosis not present

## 2019-03-04 DIAGNOSIS — M6281 Muscle weakness (generalized): Secondary | ICD-10-CM | POA: Diagnosis not present

## 2019-03-04 DIAGNOSIS — R2689 Other abnormalities of gait and mobility: Secondary | ICD-10-CM | POA: Diagnosis not present

## 2019-03-07 DIAGNOSIS — R262 Difficulty in walking, not elsewhere classified: Secondary | ICD-10-CM | POA: Diagnosis not present

## 2019-03-07 DIAGNOSIS — R2689 Other abnormalities of gait and mobility: Secondary | ICD-10-CM | POA: Diagnosis not present

## 2019-03-07 DIAGNOSIS — R2681 Unsteadiness on feet: Secondary | ICD-10-CM | POA: Diagnosis not present

## 2019-03-07 DIAGNOSIS — M6281 Muscle weakness (generalized): Secondary | ICD-10-CM | POA: Diagnosis not present

## 2019-03-09 DIAGNOSIS — R2681 Unsteadiness on feet: Secondary | ICD-10-CM | POA: Diagnosis not present

## 2019-03-09 DIAGNOSIS — M6281 Muscle weakness (generalized): Secondary | ICD-10-CM | POA: Diagnosis not present

## 2019-03-09 DIAGNOSIS — R2689 Other abnormalities of gait and mobility: Secondary | ICD-10-CM | POA: Diagnosis not present

## 2019-03-09 DIAGNOSIS — R262 Difficulty in walking, not elsewhere classified: Secondary | ICD-10-CM | POA: Diagnosis not present

## 2019-03-11 DIAGNOSIS — R262 Difficulty in walking, not elsewhere classified: Secondary | ICD-10-CM | POA: Diagnosis not present

## 2019-03-11 DIAGNOSIS — R2681 Unsteadiness on feet: Secondary | ICD-10-CM | POA: Diagnosis not present

## 2019-03-11 DIAGNOSIS — R2689 Other abnormalities of gait and mobility: Secondary | ICD-10-CM | POA: Diagnosis not present

## 2019-03-11 DIAGNOSIS — M6281 Muscle weakness (generalized): Secondary | ICD-10-CM | POA: Diagnosis not present

## 2019-03-15 ENCOUNTER — Other Ambulatory Visit: Payer: Self-pay | Admitting: Cardiology

## 2019-03-22 DIAGNOSIS — R2681 Unsteadiness on feet: Secondary | ICD-10-CM | POA: Diagnosis not present

## 2019-03-22 DIAGNOSIS — R2689 Other abnormalities of gait and mobility: Secondary | ICD-10-CM | POA: Diagnosis not present

## 2019-03-22 DIAGNOSIS — M6281 Muscle weakness (generalized): Secondary | ICD-10-CM | POA: Diagnosis not present

## 2019-03-22 DIAGNOSIS — R262 Difficulty in walking, not elsewhere classified: Secondary | ICD-10-CM | POA: Diagnosis not present

## 2019-03-23 DIAGNOSIS — R262 Difficulty in walking, not elsewhere classified: Secondary | ICD-10-CM | POA: Diagnosis not present

## 2019-03-23 DIAGNOSIS — R2681 Unsteadiness on feet: Secondary | ICD-10-CM | POA: Diagnosis not present

## 2019-03-23 DIAGNOSIS — M6281 Muscle weakness (generalized): Secondary | ICD-10-CM | POA: Diagnosis not present

## 2019-03-23 DIAGNOSIS — R2689 Other abnormalities of gait and mobility: Secondary | ICD-10-CM | POA: Diagnosis not present

## 2019-03-25 DIAGNOSIS — R2681 Unsteadiness on feet: Secondary | ICD-10-CM | POA: Diagnosis not present

## 2019-03-25 DIAGNOSIS — R2689 Other abnormalities of gait and mobility: Secondary | ICD-10-CM | POA: Diagnosis not present

## 2019-03-25 DIAGNOSIS — M6281 Muscle weakness (generalized): Secondary | ICD-10-CM | POA: Diagnosis not present

## 2019-03-25 DIAGNOSIS — R262 Difficulty in walking, not elsewhere classified: Secondary | ICD-10-CM | POA: Diagnosis not present

## 2019-03-28 DIAGNOSIS — R2689 Other abnormalities of gait and mobility: Secondary | ICD-10-CM | POA: Diagnosis not present

## 2019-03-28 DIAGNOSIS — M6281 Muscle weakness (generalized): Secondary | ICD-10-CM | POA: Diagnosis not present

## 2019-03-28 DIAGNOSIS — R2681 Unsteadiness on feet: Secondary | ICD-10-CM | POA: Diagnosis not present

## 2019-03-28 DIAGNOSIS — R262 Difficulty in walking, not elsewhere classified: Secondary | ICD-10-CM | POA: Diagnosis not present

## 2019-03-30 ENCOUNTER — Other Ambulatory Visit: Payer: Self-pay | Admitting: Cardiology

## 2019-03-30 DIAGNOSIS — R262 Difficulty in walking, not elsewhere classified: Secondary | ICD-10-CM | POA: Diagnosis not present

## 2019-03-30 DIAGNOSIS — R2681 Unsteadiness on feet: Secondary | ICD-10-CM | POA: Diagnosis not present

## 2019-03-30 DIAGNOSIS — R2689 Other abnormalities of gait and mobility: Secondary | ICD-10-CM | POA: Diagnosis not present

## 2019-03-30 DIAGNOSIS — M6281 Muscle weakness (generalized): Secondary | ICD-10-CM | POA: Diagnosis not present

## 2019-04-01 DIAGNOSIS — R2681 Unsteadiness on feet: Secondary | ICD-10-CM | POA: Diagnosis not present

## 2019-04-01 DIAGNOSIS — R2689 Other abnormalities of gait and mobility: Secondary | ICD-10-CM | POA: Diagnosis not present

## 2019-04-01 DIAGNOSIS — R262 Difficulty in walking, not elsewhere classified: Secondary | ICD-10-CM | POA: Diagnosis not present

## 2019-04-01 DIAGNOSIS — M6281 Muscle weakness (generalized): Secondary | ICD-10-CM | POA: Diagnosis not present

## 2019-04-05 DIAGNOSIS — M6281 Muscle weakness (generalized): Secondary | ICD-10-CM | POA: Diagnosis not present

## 2019-04-05 DIAGNOSIS — R262 Difficulty in walking, not elsewhere classified: Secondary | ICD-10-CM | POA: Diagnosis not present

## 2019-04-05 DIAGNOSIS — R2689 Other abnormalities of gait and mobility: Secondary | ICD-10-CM | POA: Diagnosis not present

## 2019-04-05 DIAGNOSIS — R2681 Unsteadiness on feet: Secondary | ICD-10-CM | POA: Diagnosis not present

## 2019-04-06 DIAGNOSIS — M6281 Muscle weakness (generalized): Secondary | ICD-10-CM | POA: Diagnosis not present

## 2019-04-06 DIAGNOSIS — R2681 Unsteadiness on feet: Secondary | ICD-10-CM | POA: Diagnosis not present

## 2019-04-06 DIAGNOSIS — R262 Difficulty in walking, not elsewhere classified: Secondary | ICD-10-CM | POA: Diagnosis not present

## 2019-04-06 DIAGNOSIS — R2689 Other abnormalities of gait and mobility: Secondary | ICD-10-CM | POA: Diagnosis not present

## 2019-04-08 DIAGNOSIS — R262 Difficulty in walking, not elsewhere classified: Secondary | ICD-10-CM | POA: Diagnosis not present

## 2019-04-08 DIAGNOSIS — R2681 Unsteadiness on feet: Secondary | ICD-10-CM | POA: Diagnosis not present

## 2019-04-08 DIAGNOSIS — M6281 Muscle weakness (generalized): Secondary | ICD-10-CM | POA: Diagnosis not present

## 2019-04-08 DIAGNOSIS — R2689 Other abnormalities of gait and mobility: Secondary | ICD-10-CM | POA: Diagnosis not present

## 2019-04-12 DIAGNOSIS — M6281 Muscle weakness (generalized): Secondary | ICD-10-CM | POA: Diagnosis not present

## 2019-04-12 DIAGNOSIS — R2681 Unsteadiness on feet: Secondary | ICD-10-CM | POA: Diagnosis not present

## 2019-04-12 DIAGNOSIS — R262 Difficulty in walking, not elsewhere classified: Secondary | ICD-10-CM | POA: Diagnosis not present

## 2019-04-12 DIAGNOSIS — R2689 Other abnormalities of gait and mobility: Secondary | ICD-10-CM | POA: Diagnosis not present

## 2019-04-15 DIAGNOSIS — R262 Difficulty in walking, not elsewhere classified: Secondary | ICD-10-CM | POA: Diagnosis not present

## 2019-04-15 DIAGNOSIS — R2689 Other abnormalities of gait and mobility: Secondary | ICD-10-CM | POA: Diagnosis not present

## 2019-04-15 DIAGNOSIS — M6281 Muscle weakness (generalized): Secondary | ICD-10-CM | POA: Diagnosis not present

## 2019-04-15 DIAGNOSIS — R2681 Unsteadiness on feet: Secondary | ICD-10-CM | POA: Diagnosis not present

## 2019-04-16 DIAGNOSIS — Z23 Encounter for immunization: Secondary | ICD-10-CM | POA: Diagnosis not present

## 2019-04-19 DIAGNOSIS — R262 Difficulty in walking, not elsewhere classified: Secondary | ICD-10-CM | POA: Diagnosis not present

## 2019-04-19 DIAGNOSIS — M6281 Muscle weakness (generalized): Secondary | ICD-10-CM | POA: Diagnosis not present

## 2019-04-19 DIAGNOSIS — R2689 Other abnormalities of gait and mobility: Secondary | ICD-10-CM | POA: Diagnosis not present

## 2019-04-19 DIAGNOSIS — R2681 Unsteadiness on feet: Secondary | ICD-10-CM | POA: Diagnosis not present

## 2019-04-22 DIAGNOSIS — R2689 Other abnormalities of gait and mobility: Secondary | ICD-10-CM | POA: Diagnosis not present

## 2019-04-22 DIAGNOSIS — R2681 Unsteadiness on feet: Secondary | ICD-10-CM | POA: Diagnosis not present

## 2019-04-22 DIAGNOSIS — M6281 Muscle weakness (generalized): Secondary | ICD-10-CM | POA: Diagnosis not present

## 2019-04-22 DIAGNOSIS — R262 Difficulty in walking, not elsewhere classified: Secondary | ICD-10-CM | POA: Diagnosis not present

## 2019-04-26 DIAGNOSIS — M6281 Muscle weakness (generalized): Secondary | ICD-10-CM | POA: Diagnosis not present

## 2019-04-26 DIAGNOSIS — R2689 Other abnormalities of gait and mobility: Secondary | ICD-10-CM | POA: Diagnosis not present

## 2019-04-26 DIAGNOSIS — R2681 Unsteadiness on feet: Secondary | ICD-10-CM | POA: Diagnosis not present

## 2019-04-26 DIAGNOSIS — R262 Difficulty in walking, not elsewhere classified: Secondary | ICD-10-CM | POA: Diagnosis not present

## 2019-04-29 DIAGNOSIS — R2689 Other abnormalities of gait and mobility: Secondary | ICD-10-CM | POA: Diagnosis not present

## 2019-04-29 DIAGNOSIS — R2681 Unsteadiness on feet: Secondary | ICD-10-CM | POA: Diagnosis not present

## 2019-04-29 DIAGNOSIS — M6281 Muscle weakness (generalized): Secondary | ICD-10-CM | POA: Diagnosis not present

## 2019-04-29 DIAGNOSIS — R262 Difficulty in walking, not elsewhere classified: Secondary | ICD-10-CM | POA: Diagnosis not present

## 2019-05-03 DIAGNOSIS — R2681 Unsteadiness on feet: Secondary | ICD-10-CM | POA: Diagnosis not present

## 2019-05-03 DIAGNOSIS — M6281 Muscle weakness (generalized): Secondary | ICD-10-CM | POA: Diagnosis not present

## 2019-05-03 DIAGNOSIS — R2689 Other abnormalities of gait and mobility: Secondary | ICD-10-CM | POA: Diagnosis not present

## 2019-05-03 DIAGNOSIS — R262 Difficulty in walking, not elsewhere classified: Secondary | ICD-10-CM | POA: Diagnosis not present

## 2019-05-06 DIAGNOSIS — R2689 Other abnormalities of gait and mobility: Secondary | ICD-10-CM | POA: Diagnosis not present

## 2019-05-06 DIAGNOSIS — R2681 Unsteadiness on feet: Secondary | ICD-10-CM | POA: Diagnosis not present

## 2019-05-06 DIAGNOSIS — M6281 Muscle weakness (generalized): Secondary | ICD-10-CM | POA: Diagnosis not present

## 2019-05-06 DIAGNOSIS — R262 Difficulty in walking, not elsewhere classified: Secondary | ICD-10-CM | POA: Diagnosis not present

## 2019-05-09 DIAGNOSIS — R262 Difficulty in walking, not elsewhere classified: Secondary | ICD-10-CM | POA: Diagnosis not present

## 2019-05-09 DIAGNOSIS — R2681 Unsteadiness on feet: Secondary | ICD-10-CM | POA: Diagnosis not present

## 2019-05-09 DIAGNOSIS — R2689 Other abnormalities of gait and mobility: Secondary | ICD-10-CM | POA: Diagnosis not present

## 2019-05-09 DIAGNOSIS — M6281 Muscle weakness (generalized): Secondary | ICD-10-CM | POA: Diagnosis not present

## 2019-05-11 DIAGNOSIS — R2681 Unsteadiness on feet: Secondary | ICD-10-CM | POA: Diagnosis not present

## 2019-05-11 DIAGNOSIS — M6281 Muscle weakness (generalized): Secondary | ICD-10-CM | POA: Diagnosis not present

## 2019-05-11 DIAGNOSIS — R2689 Other abnormalities of gait and mobility: Secondary | ICD-10-CM | POA: Diagnosis not present

## 2019-05-11 DIAGNOSIS — R262 Difficulty in walking, not elsewhere classified: Secondary | ICD-10-CM | POA: Diagnosis not present

## 2019-05-16 DIAGNOSIS — R2689 Other abnormalities of gait and mobility: Secondary | ICD-10-CM | POA: Diagnosis not present

## 2019-05-16 DIAGNOSIS — M6281 Muscle weakness (generalized): Secondary | ICD-10-CM | POA: Diagnosis not present

## 2019-05-16 DIAGNOSIS — R2681 Unsteadiness on feet: Secondary | ICD-10-CM | POA: Diagnosis not present

## 2019-05-16 DIAGNOSIS — R262 Difficulty in walking, not elsewhere classified: Secondary | ICD-10-CM | POA: Diagnosis not present

## 2019-06-28 ENCOUNTER — Other Ambulatory Visit: Payer: Self-pay | Admitting: Cardiology

## 2019-07-05 ENCOUNTER — Other Ambulatory Visit: Payer: Self-pay | Admitting: Internal Medicine

## 2019-08-03 DIAGNOSIS — Z119 Encounter for screening for infectious and parasitic diseases, unspecified: Secondary | ICD-10-CM | POA: Diagnosis not present

## 2019-08-25 ENCOUNTER — Other Ambulatory Visit: Payer: Self-pay | Admitting: Cardiology

## 2019-08-25 DIAGNOSIS — Z23 Encounter for immunization: Secondary | ICD-10-CM | POA: Diagnosis not present

## 2019-09-22 DIAGNOSIS — Z23 Encounter for immunization: Secondary | ICD-10-CM | POA: Diagnosis not present

## 2019-09-23 ENCOUNTER — Other Ambulatory Visit: Payer: Self-pay | Admitting: Cardiology

## 2019-09-28 ENCOUNTER — Other Ambulatory Visit: Payer: Self-pay | Admitting: Cardiology

## 2019-10-04 ENCOUNTER — Other Ambulatory Visit: Payer: Self-pay | Admitting: Internal Medicine

## 2019-10-26 ENCOUNTER — Other Ambulatory Visit: Payer: Self-pay | Admitting: Cardiology

## 2019-10-26 ENCOUNTER — Other Ambulatory Visit: Payer: Self-pay | Admitting: Internal Medicine

## 2019-11-01 ENCOUNTER — Other Ambulatory Visit: Payer: Self-pay

## 2019-11-01 ENCOUNTER — Encounter: Payer: Self-pay | Admitting: Cardiology

## 2019-11-01 ENCOUNTER — Ambulatory Visit (INDEPENDENT_AMBULATORY_CARE_PROVIDER_SITE_OTHER): Payer: Medicare Other | Admitting: Cardiology

## 2019-11-01 VITALS — BP 124/64 | HR 85 | Ht 65.0 in | Wt 157.2 lb

## 2019-11-01 DIAGNOSIS — I10 Essential (primary) hypertension: Secondary | ICD-10-CM | POA: Diagnosis not present

## 2019-11-01 DIAGNOSIS — E7800 Pure hypercholesterolemia, unspecified: Secondary | ICD-10-CM

## 2019-11-01 DIAGNOSIS — I2510 Atherosclerotic heart disease of native coronary artery without angina pectoris: Secondary | ICD-10-CM

## 2019-11-01 DIAGNOSIS — I420 Dilated cardiomyopathy: Secondary | ICD-10-CM

## 2019-11-01 DIAGNOSIS — I272 Pulmonary hypertension, unspecified: Secondary | ICD-10-CM | POA: Diagnosis not present

## 2019-11-01 DIAGNOSIS — I5032 Chronic diastolic (congestive) heart failure: Secondary | ICD-10-CM

## 2019-11-01 NOTE — Progress Notes (Signed)
Cardiology Office Note:    Date:  11/01/2019   ID:  [name], DOB Jun 22, 1923, MRN [id number]  PCP:  Gaynelle Arabian, MD  Cardiologist:  Fransico Him, MD    Referring MD: Gaynelle Arabian, MD   Chief Complaint  Patient presents with  . Coronary Artery Disease  . Hypertension  . Hyperlipidemia  . Congestive Heart Failure    History of Present Illness:    [name] is a 86 y.o. female with a hx of ASCAD s/p DES to LAD with residual disease of the diag too small for PCI by cath 01/2010 and then repeat cath with restenosis of the LADs/p PCI 10/2010, chronic stable angina, ischemic DCM ( EF 45% on echo 08/2015), LBBB, BiVPPM, HTN, dyslipidemia and chronic systolic CHF.    She is here today for followup and is doing well.  She has chronic stable angina and has only had 4-5 episodes of CP in the past year all resolving with 1 SL NTG.  She has chronic DOE from sedentary state which is stable.  She denies any  PND, orthopnea, LE edema dizziness, palpitations or syncope. She is compliant with her meds and is tolerating meds with no SE.    Past Medical History:  Diagnosis Date  . Breast cancer ([place])    bilaterally  . CAD (coronary artery disease)    w/ DES to LAD with residual diagonal disease to small for PCI 6/11 and repeat DES to LAD for restenosis 3/12  . Cardiomyopathy ([place])    Mixed CM; s/p St. Jude CRT-P implant 10/24 w/ LV lead revision 10/25 . Echo 11-2017 EF 55%.    . Chronic diastolic (congestive) heart failure ([place])   . Dyslipidemia, goal LDL below 70   . GERD (gastroesophageal reflux disease)    occ  . Hematuria 02/23/2013   w/clots required hospital admission on 02/23/2013  . Hypertension   . LBBB (left bundle branch block)   . Macular degeneration   . Mild mitral regurgitation   . Osteoarthritis    "right knee" (02/23/2013)  . Osteopenia   . Pacemaker   . Pulmonary HTN (HCC)    PASP 4mHg by echo 11/2017  . Umbilical hernia    Repaired on 01/10/2013   . Vitamin  B12 deficiency     Past Surgical History:  Procedure Laterality Date  . BI-VENTRICULAR PACEMAKER INSERTION N/A 05/27/2012   Procedure: BI-VENTRICULAR PACEMAKER INSERTION (CRT-P);  Surgeon: SDeboraha Sprang MD;  [facility];  Service: Cardiovascular;  Laterality: N/A;  . BREAST BIOPSY  1995   bilaterally  . CATARACT EXTRACTION    . CATARACT EXTRACTION W/ INTRAOCULAR LENS  IMPLANT, BILATERAL  1990's  . CORONARY ANGIOPLASTY WITH STENT PLACEMENT  2010   "1" (05/27/2012)  . HERNIA REPAIR    . HIP ARTHROPLASTY Left 10/08/2017   Procedure: LEFT HIP HIMIARTHROPLASTY;  Surgeon: DMelrose Nakayama MD;  [place] [facility]  Service: Orthopedics;  Laterality: Left;  . INSERT / REPLACE / REMOVE PACEMAKER  05/27/2012   initial placement - BiV PPM  . INSERT / REPLACE / REMOVE PACEMAKER  05/31/2012   "lead change/repaired" (05/31/2012)  . INSERTION OF MESH N/A 01/10/2013   Procedure: INSERTION OF MESH;  Surgeon: CHaywood Lasso MD;  [place] [facility]  Service: General;  Laterality: N/A;  . JOINT REPLACEMENT    . LEAD REVISION N/A 05/28/2012   Procedure: LEAD REVISION;  Surgeon: GEvans Lance MD;  [facility];  Service: Cardiovascular;  Laterality: N/A;  . LEAD REVISION Left 05/31/2012   Procedure: LEAD REVISION;  Surgeon: Deboraha Sprang, MD;  Location: [facility] CATH LAB;  Service: Cardiovascular;  Laterality: Left;  Marland Kitchen MASTECTOMY  1995   bilaterally  . TONSILLECTOMY    . TOTAL HIP ARTHROPLASTY  2011   right  . UMBILICAL HERNIA REPAIR N/A 01/10/2013   Procedure: HERNIA REPAIR UMBILICAL ADULT;  Surgeon: Haywood Lasso, MD;  Location: [place];  Service: General;  Laterality: N/A;    Current Medications: Current Meds  Medication Sig  . acetaminophen (TYLENOL) 500 MG tablet Take 500-1,000 mg by mouth every 6 (six) hours as needed.  Marland Kitchen aspirin EC 81 MG tablet Take 81 mg by mouth daily.  . Cholecalciferol (VITAMIN D-3) 5000 UNITS TABS Take 5,000 Units by mouth daily.   . clopidogrel  (PLAVIX) 75 MG tablet Take 1 tablet (75 mg total) by mouth daily. Please keep upcoming appt with Dr. Radford Pax in March before anymore refills. Thank you  . Cyanocobalamin (B-12) 1000 MCG/ML KIT Inject 1,000 mcg as directed every 30 (thirty) days.  . furosemide (LASIX) 40 MG tablet Take 1 tablet (40 mg total) by mouth daily. Please keep upcoming appt in [place] with Dr. Caryl Comes before anymore refills. Thank you  . isosorbide mononitrate (IMDUR) 60 MG 24 hr tablet TAKE ONE AND ONE-HALF TABLETS DAILY  . Multiple Vitamin (MULTIVITAMIN WITH MINERALS) TABS tablet Take 1 tablet by mouth daily.  . nitroGLYCERIN (NITROSTAT) 0.4 MG SL tablet Place 1 tablet (0.4 mg total) every 5 (five) minutes as needed under the tongue for chest pain.  Marland Kitchen omeprazole (PRILOSEC) 20 MG capsule Take 20 mg by mouth daily.  . simvastatin (ZOCOR) 20 MG tablet Take 1 tablet (20 mg total) by mouth daily at 6 PM. Please keep upcoming appt with Dr. Radford Pax in March before anymore refills. Thank you  . traMADol (ULTRAM) 50 MG tablet Take 50 mg by mouth as directed. Knee pain  . vitamin C (ASCORBIC ACID) 500 MG tablet Take 500 mg by mouth daily.     Allergies:   Altace [ramipril] and Celebrex [celecoxib]   Social History   Socioeconomic History  . Marital status: Widowed    Spouse name: Not on file  . Number of children: Not on file  . Years of education: Not on file  . Highest education level: Not on file  Occupational History  . Not on file  Tobacco Use  . Smoking status: Former Smoker    Packs/day: 1.00    Years: 20.00    Pack years: 20.00    Types: Cigarettes    Start date: 03/17/1943    Quit date: 01/05/1963    Years since quitting: 56.8  . Smokeless tobacco: Never Used  Substance and Sexual Activity  . Alcohol use: Yes    Alcohol/week: 5.0 standard drinks    Types: 5 Glasses of wine per week    Comment: 02/23/2013 "glass of wine ~ 5X/wk":  . Drug use: No  . Sexual activity: Not Currently  Other Topics Concern  . Not on  file  Social History Narrative   Originally from [place], [place]. Previously lived in [place], [place]. Graduated college with a business degree (BS in [employer]). Currently enjoys reading. Previously enjoyed sewing and knitting. Previously also enjoyed water skiing. Previously worked as a Network engineer. Husband was a Personnel officer. Also previously worked for the [employer] for a year. 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 Doesn't have down pillows. No mold  or asbestos exposure.    Social Determinants of Health   Financial Resource Strain:   . Difficulty of Paying Living Expenses:   Food Insecurity:   . Worried About Charity fundraiser in the Last Year:   . Arboriculturist in the Last Year:   Transportation Needs:   . Film/video editor (Medical):   Marland Kitchen Lack of Transportation (Non-Medical):   Physical Activity:   . Days of Exercise per Week:   . Minutes of Exercise per Session:   Stress:   . Feeling of Stress :   Social Connections:   . Frequency of Communication with Friends and Family:   . Frequency of Social Gatherings with Friends and Family:   . Attends Religious Services:   . Active Member of Clubs or Organizations:   . Attends Archivist Meetings:   Marland Kitchen Marital Status:      Family History: The patient's family history includes Asthma in her grandchild; Breast cancer in her daughter, maternal aunt, maternal aunt, and mother; Heart disease in her father and mother.  ROS:   Please see the history of present illness.    ROS  All other systems reviewed and negative.   EKGs/Labs/Other Studies Reviewed:    The following studies were reviewed today: EKG  EKG:  EKG is  ordered today.  The ekg ordered today demonstrates NSR with PACs and PVCs and V pacing  Recent Labs: No results found for requested labs within last 8760 hours.   Recent Lipid Panel    Component Value Date/Time   CHOL 120 01/13/2018 1107   TRIG 83 01/13/2018 1107    HDL 61 01/13/2018 1107   CHOLHDL 2.0 01/13/2018 1107   CHOLHDL 1.9 11/27/2015 1042   VLDL 11 11/27/2015 1042   LDLCALC 42 01/13/2018 1107    Physical Exam:    VS:  BP 124/64   Pulse 85   Ht 5' 5"  (1.651 m)   Wt 157 lb 3.2 oz (71.3 kg)   BMI 26.16 kg/m     Wt Readings from Last 3 Encounters:  11/01/19 157 lb 3.2 oz (71.3 kg)  08/17/18 168 lb 12.8 oz (76.6 kg)  01/06/18 170 lb 9.6 oz (77.4 kg)     GEN:  Well nourished, well developed in no acute distress HEENT: Normal NECK: No JVD; No carotid bruits LYMPHATICS: No lymphadenopathy CARDIAC: RRR, no murmurs, rubs, gallops RESPIRATORY:  Clear to auscultation without rales, wheezing or rhonchi  ABDOMEN: Soft, non-tender, non-distended MUSCULOSKELETAL:  No edema; No deformity  SKIN: Warm and dry NEUROLOGIC:  Alert and oriented x 3 PSYCHIATRIC:  Normal affect   ASSESSMENT:    1. Coronary artery disease involving native coronary artery of native heart without angina pectoris   2. Benign essential HTN   3. Dilated cardiomyopathy ([place])   4. Chronic diastolic CHF (congestive heart failure) ([place])   5. Pulmonary HTN ([place])   6. Pure hypercholesterolemia    PLAN:    In order of problems listed above:  1.  ASCAD  - s/p DES to LAD with residual disease of the diag too small for PCI by cath 01/2010 and then repeat cath with restenosis of the LADs/p PCI 10/2010.   -she has chronic stable angina and ha only had 4-5 episodes of CP in the past year and all resolved after 1 SL NTG -continue ASA 33m daily, Plavix, statin and long acting nitrates  2.  Hypertension  -BP is well controlled -currently not on any  antihypertensive meds  3.  Dilated cardiomyopathy  - LV function normalized after CRT-P with echo 12/01/2017 showing normal LV function with EF 55 to 60%.   -She is off ACE inhibitor and beta-blocker due to soft blood pressure and CKD -followed in device clinic  4.  Chronic diastolic heart failure  -she appears euvolemic  on exam today -weight has been stable -continue Lasix 93m daily -check BMET  5.  Pulmonary hypertension  -echo 47/02/3017showed PA systolic pressure was 40 mmHg.   -This is likely secondary to group 2 pulmonary hypertension secondary to pulmonary venous hypertension from LV diastolic heart failure.   -continue diuretic therapy.  6.  Hyperlipidemia  -LDL goal less than 70.   -continue Simvastatin 226mdaily -check FLP and ALT   Medication Adjustments/Labs and Tests Ordered: Current medicines are reviewed at length with the patient today.  Concerns regarding medicines are outlined above.  Orders Placed This Encounter  Procedures  . Lipid panel  . Comprehensive metabolic panel  . EKG 12-Lead   No orders of the defined types were placed in this encounter.   Signed, TrFransico HimMD  11/01/2019 1:10 PM    [facility]

## 2019-11-01 NOTE — Patient Instructions (Signed)
Medication Instructions:  *If you need a refill on your cardiac medications before your next appointment, please call your pharmacy*   Lab Work: Your physician recommends that you return for lab work any time when you are fasting for lipid panel and CMET.  If you have labs (blood work) drawn today and your tests are completely normal, you will receive your results only by: Marland Kitchen MyChart Message (if you have MyChart) OR . A paper copy in the mail If you have any lab test that is abnormal or we need to change your treatment, we will call you to review the results.  Follow-Up: At [facility], you and your health needs are our priority.  As part of our continuing mission to provide you with exceptional heart care, we have created designated Provider Care Teams.  These Care Teams include your primary Cardiologist (physician) and Advanced Practice Providers (APPs -  Physician Assistants and Nurse Practitioners) who all work together to provide you with the care you need, when you need it.  We recommend signing up for the patient portal called "MyChart".  Sign up information is provided on this After Visit Summary.  MyChart is used to connect with patients for Virtual Visits (Telemedicine).  Patients are able to view lab/test results, encounter notes, upcoming appointments, etc.  Non-urgent messages can be sent to your provider as well.   To learn more about what you can do with MyChart, go to [email or web address].    Your next appointment:   1 year(s)  The format for your next appointment:   Either In Person or Virtual  Provider:   You may see Fransico Him, MD or one of the following Advanced Practice Providers on your designated Care Team:    Melina Copa, PA-C  Ermalinda Barrios, PA-C

## 2019-11-08 ENCOUNTER — Other Ambulatory Visit: Payer: Self-pay

## 2019-11-08 ENCOUNTER — Other Ambulatory Visit: Payer: Medicare Other

## 2019-11-08 DIAGNOSIS — I10 Essential (primary) hypertension: Secondary | ICD-10-CM

## 2019-11-08 DIAGNOSIS — I272 Pulmonary hypertension, unspecified: Secondary | ICD-10-CM

## 2019-11-08 DIAGNOSIS — I5032 Chronic diastolic (congestive) heart failure: Secondary | ICD-10-CM

## 2019-11-08 DIAGNOSIS — I2510 Atherosclerotic heart disease of native coronary artery without angina pectoris: Secondary | ICD-10-CM | POA: Diagnosis not present

## 2019-11-08 DIAGNOSIS — E7800 Pure hypercholesterolemia, unspecified: Secondary | ICD-10-CM | POA: Diagnosis not present

## 2019-11-08 DIAGNOSIS — I420 Dilated cardiomyopathy: Secondary | ICD-10-CM | POA: Diagnosis not present

## 2019-11-08 LAB — COMPREHENSIVE METABOLIC PANEL
ALT: 8 IU/L (ref 0–32)
AST: 17 IU/L (ref 0–40)
Albumin/Globulin Ratio: 2 (ref 1.2–2.2)
Albumin: 4.3 g/dL (ref 3.5–4.6)
Alkaline Phosphatase: 77 IU/L (ref 39–117)
BUN/Creatinine Ratio: 18 (ref 12–28)
BUN: 22 mg/dL (ref 10–36)
Bilirubin Total: 0.5 mg/dL (ref 0.0–1.2)
CO2: 25 mmol/L (ref 20–29)
Calcium: 10 mg/dL (ref 8.7–10.3)
Chloride: 105 mmol/L (ref 96–106)
Creatinine, Ser: 1.19 mg/dL — ABNORMAL HIGH (ref 0.57–1.00)
GFR calc Af Amer: 45 mL/min/{1.73_m2} — ABNORMAL LOW (ref >59)
GFR calc non Af Amer: 39 mL/min/{1.73_m2} — ABNORMAL LOW (ref >59)
Globulin, Total: 2.1 g/dL (ref 1.5–4.5)
Glucose: 99 mg/dL (ref 65–99)
Potassium: 4.9 mmol/L (ref 3.5–5.2)
Sodium: 144 mmol/L (ref 134–144)
Total Protein: 6.4 g/dL (ref 6.0–8.5)

## 2019-11-08 LAB — LIPID PANEL
Chol/HDL Ratio: 1.9 ratio (ref 0.0–4.4)
Cholesterol, Total: 143 mg/dL (ref 100–199)
HDL: 75 mg/dL (ref >39)
LDL Chol Calc (NIH): 53 mg/dL (ref 0–99)
Triglycerides: 81 mg/dL (ref 0–149)
VLDL Cholesterol Cal: 15 mg/dL (ref 5–40)

## 2019-11-15 ENCOUNTER — Other Ambulatory Visit: Payer: Self-pay

## 2019-11-15 ENCOUNTER — Encounter: Payer: Self-pay | Admitting: Internal Medicine

## 2019-11-15 ENCOUNTER — Ambulatory Visit (INDEPENDENT_AMBULATORY_CARE_PROVIDER_SITE_OTHER): Payer: Medicare Other | Admitting: Internal Medicine

## 2019-11-15 VITALS — BP 112/68 | HR 93 | Ht 64.0 in | Wt 153.0 lb

## 2019-11-15 DIAGNOSIS — I5022 Chronic systolic (congestive) heart failure: Secondary | ICD-10-CM | POA: Diagnosis not present

## 2019-11-15 DIAGNOSIS — I255 Ischemic cardiomyopathy: Secondary | ICD-10-CM | POA: Diagnosis not present

## 2019-11-15 DIAGNOSIS — Z950 Presence of cardiac pacemaker: Secondary | ICD-10-CM

## 2019-11-15 NOTE — Patient Instructions (Addendum)
Medication Instructions:  Your physician recommends that you continue on your current medications as directed. Please refer to the Current Medication list given to you today.  Labwork: You will have labs drawn today: CBC  Testing/Procedures: None ordered.  Follow-Up: Your physician wants you to follow-up in: 12 months with Dr Caryl Comes. You will receive a reminder letter in the mail two months in advance. If you don't receive a letter, please call our office to schedule the follow-up appointment.  Remote monitoring is used to monitor your Pacemaker of ICD from home. This monitoring reduces the number of office visits required to check your device to one time per year. It allows [place] to keep an eye on the functioning of your device to ensure it is working properly.   Any Other Special Instructions Will Be Listed Below (If Applicable).  If you need a refill on your cardiac medications before your next appointment, please call your pharmacy.

## 2019-11-15 NOTE — Progress Notes (Signed)
Patient Care Team: Gaynelle Arabian, MD as PCP - General (Family Medicine) Sueanne Margarita, MD as PCP - Cardiology (Cardiology)   HPI  [name] is a 86 y.o. female Seen in followup for congestive heart failure in the setting of ischemic and ischemic heart disease status post CRT P . Implantation complicated by lead dislodgment X3   No new complaints of dyspnea; quite stable mild shortness of breath.  Have always been big.  No significant swelling.  No orthopnea or nocturnal dyspnea.    Date Cr Hgb  12/17 1.11 10.6  3/19 1.42  8.8  4/21  1.19      DATE TEST EF    4/18 Echo   55-60  %   4/19 Echo  55-60      Past Medical History:  Diagnosis Date  . Breast cancer ([place])    bilaterally  . CAD (coronary artery disease)    w/ DES to LAD with residual diagonal disease to small for PCI 6/11 and repeat DES to LAD for restenosis 3/12  . Cardiomyopathy ([place])    Mixed CM; s/p St. Jude CRT-P implant 10/24 w/ LV lead revision 10/25 . Echo 11-2017 EF 55%.    . Chronic diastolic (congestive) heart failure ([place])   . Dyslipidemia, goal LDL below 70   . GERD (gastroesophageal reflux disease)    occ  . Hematuria 02/23/2013   w/clots required hospital admission on 02/23/2013  . Hypertension   . LBBB (left bundle branch block)   . Macular degeneration   . Mild mitral regurgitation   . Osteoarthritis    "right knee" (02/23/2013)  . Osteopenia   . Pacemaker   . Pulmonary HTN ([facility])    PASP 19mHg by echo 11/2017  . Umbilical hernia    Repaired on 01/10/2013   . Vitamin B12 deficiency     Past Surgical History:  Procedure Laterality Date  . BI-VENTRICULAR PACEMAKER INSERTION N/A 05/27/2012   Procedure: BI-VENTRICULAR PACEMAKER INSERTION (CRT-P);  Surgeon: SDeboraha Sprang MD;  [facility];  Service: Cardiovascular;  Laterality: N/A;  . BREAST BIOPSY  1995   bilaterally  . CATARACT EXTRACTION    . CATARACT EXTRACTION W/ INTRAOCULAR LENS  IMPLANT, BILATERAL  1990's  . CORONARY  ANGIOPLASTY WITH STENT PLACEMENT  2010   "1" (05/27/2012)  . HERNIA REPAIR    . HIP ARTHROPLASTY Left 10/08/2017   Procedure: LEFT HIP HIMIARTHROPLASTY;  Surgeon: DMelrose Nakayama MD;  [facility]  Service: Orthopedics;  Laterality: Left;  . INSERT / REPLACE / REMOVE PACEMAKER  05/27/2012   initial placement - BiV PPM  . INSERT / REPLACE / REMOVE PACEMAKER  05/31/2012   "lead change/repaired" (05/31/2012)  . INSERTION OF MESH N/A 01/10/2013   Procedure: INSERTION OF MESH;  Surgeon: CHaywood Lasso MD;  [facility] [facility]  Service: General;  Laterality: N/A;  . JOINT REPLACEMENT    . LEAD REVISION N/A 05/28/2012   Procedure: LEAD REVISION;  Surgeon: GEvans Lance MD;  [facility];  Service: Cardiovascular;  Laterality: N/A;  . LEAD REVISION Left 05/31/2012   Procedure: LEAD REVISION;  Surgeon: SDeboraha Sprang MD;  [facility];  Service: Cardiovascular;  Laterality: Left;  .Marland KitchenMASTECTOMY  1995   bilaterally  . TONSILLECTOMY    . TOTAL HIP ARTHROPLASTY  2011   right  . UMBILICAL HERNIA REPAIR N/A 01/10/2013   Procedure: HERNIA REPAIR UMBILICAL ADULT;  Surgeon: CHaywood Lasso MD;  [facility];  Service: General;  Laterality: N/A;    Current Outpatient Medications  Medication Sig Dispense Refill  . acetaminophen (TYLENOL) 500 MG tablet Take 500-1,000 mg by mouth every 6 (six) hours as needed.    Marland Kitchen aspirin EC 81 MG tablet Take 81 mg by mouth daily.    . Cholecalciferol (VITAMIN D-3) 5000 UNITS TABS Take 5,000 Units by mouth daily.     . clopidogrel (PLAVIX) 75 MG tablet Take 1 tablet (75 mg total) by mouth daily. Please keep upcoming appt with Dr. Radford Pax in March before anymore refills. Thank you 30 tablet 0  . Cyanocobalamin (B-12) 1000 MCG/ML KIT Inject 1,000 mcg as directed every 30 (thirty) days.    . furosemide (LASIX) 40 MG tablet Take 1 tablet (40 mg total) by mouth daily. Please keep upcoming appt in [place] with Dr. Caryl Comes before anymore refills. Thank you  30 tablet 0  . isosorbide mononitrate (IMDUR) 60 MG 24 hr tablet TAKE ONE AND ONE-HALF TABLETS DAILY 45 tablet 0  . Multiple Vitamin (MULTIVITAMIN WITH MINERALS) TABS tablet Take 1 tablet by mouth daily.    . nitroGLYCERIN (NITROSTAT) 0.4 MG SL tablet Place 1 tablet (0.4 mg total) every 5 (five) minutes as needed under the tongue for chest pain. 25 tablet 3  . omeprazole (PRILOSEC) 20 MG capsule Take 20 mg by mouth daily.    . simvastatin (ZOCOR) 20 MG tablet Take 1 tablet (20 mg total) by mouth daily at 6 PM. Please keep upcoming appt with Dr. Radford Pax in March before anymore refills. Thank you 30 tablet 0  . traMADol (ULTRAM) 50 MG tablet Take 50 mg by mouth as directed. Knee pain    . vitamin C (ASCORBIC ACID) 500 MG tablet Take 500 mg by mouth daily.     No current facility-administered medications for this visit.    Allergies  Allergen Reactions  . Altace [Ramipril] Cough  . Celebrex [Celecoxib] Swelling and Cough    Reported by Dr. Andrew Au office on 07/22/2016.     Review of Systems negative except from HPI and PMH  Physical Exam BP 112/68   Pulse 93   Ht 5' 4"  (1.626 m)   Wt 153 lb (69.4 kg)   SpO2 92%   BMI 26.26 kg/m  Well developed and well nourished in no acute distress HENT normal Neck supple with JVP-flat Clear Device pocket well healed; without hematoma or erythema.  There is no tethering  Regular rate and rhythm, no murmur Abd-soft with active BS No Clubbing cyanosis   Edema; legs is compression  Skin-warm and dry A & Oriented  Grossly normal sensory and motor function  ECG sinus rhythm with P synchronous pacing and frequent PACs that are spontaneously conducted  Assessment and  Plan  Ischemic and nonischemic heart disease  Cardiomyopathy-resolved  Complete heart block-intermittent   Syncope  CRT P  The patient's device was interrogated.  The information was reviewed. No changes were made in the programming.   .      Chronic edema  DOE     HFpEF    Without symptoms of ischemia  Euvolemic continue current meds--legs are big but no pitting edema   No syncope  dypsnea stable

## 2019-11-16 LAB — CBC
Hematocrit: 37.3 % (ref 34.0–46.6)
Hemoglobin: 12.4 g/dL (ref 11.1–15.9)
MCH: 32.9 pg (ref 26.6–33.0)
MCHC: 33.2 g/dL (ref 31.5–35.7)
MCV: 99 fL — ABNORMAL HIGH (ref 79–97)
Platelets: 229 10*3/uL (ref 150–450)
RBC: 3.77 x10E6/uL (ref 3.77–5.28)
RDW: 12.3 % (ref 11.7–15.4)
WBC: 9.1 10*3/uL (ref 3.4–10.8)

## 2019-11-18 DIAGNOSIS — M1990 Unspecified osteoarthritis, unspecified site: Secondary | ICD-10-CM | POA: Diagnosis not present

## 2019-11-18 DIAGNOSIS — H9190 Unspecified hearing loss, unspecified ear: Secondary | ICD-10-CM | POA: Diagnosis not present

## 2019-11-18 DIAGNOSIS — F331 Major depressive disorder, recurrent, moderate: Secondary | ICD-10-CM | POA: Diagnosis not present

## 2019-11-22 DIAGNOSIS — R2681 Unsteadiness on feet: Secondary | ICD-10-CM | POA: Diagnosis not present

## 2019-11-22 DIAGNOSIS — M6281 Muscle weakness (generalized): Secondary | ICD-10-CM | POA: Diagnosis not present

## 2019-11-23 ENCOUNTER — Other Ambulatory Visit: Payer: Self-pay | Admitting: Cardiology

## 2019-11-23 ENCOUNTER — Other Ambulatory Visit: Payer: Self-pay | Admitting: Internal Medicine

## 2019-11-24 DIAGNOSIS — R2681 Unsteadiness on feet: Secondary | ICD-10-CM | POA: Diagnosis not present

## 2019-11-24 DIAGNOSIS — M6281 Muscle weakness (generalized): Secondary | ICD-10-CM | POA: Diagnosis not present

## 2019-11-29 DIAGNOSIS — M6281 Muscle weakness (generalized): Secondary | ICD-10-CM | POA: Diagnosis not present

## 2019-11-29 DIAGNOSIS — R2681 Unsteadiness on feet: Secondary | ICD-10-CM | POA: Diagnosis not present

## 2019-12-02 ENCOUNTER — Telehealth: Payer: Self-pay | Admitting: *Deleted

## 2019-12-02 DIAGNOSIS — M6281 Muscle weakness (generalized): Secondary | ICD-10-CM | POA: Diagnosis not present

## 2019-12-02 DIAGNOSIS — R2681 Unsteadiness on feet: Secondary | ICD-10-CM | POA: Diagnosis not present

## 2019-12-02 LAB — CUP PACEART INCLINIC DEVICE CHECK
Battery Remaining Longevity: 29 mo
Battery Voltage: 2.93 V
Brady Statistic AP VP Percent: 0.16 %
Brady Statistic AP VS Percent: 0 %
Brady Statistic AS VP Percent: 98.97 %
Brady Statistic AS VS Percent: 0.87 %
Brady Statistic RA Percent Paced: 0.16 %
Brady Statistic RV Percent Paced: 92.74 %
Date Time Interrogation Session: 20210413205400
Implantable Lead Implant Date: 20131024
Implantable Lead Implant Date: 20131024
Implantable Lead Implant Date: 20131028
Implantable Lead Location: 753858
Implantable Lead Location: 753859
Implantable Lead Location: 753860
Implantable Lead Model: 1944
Implantable Lead Model: 1948
Implantable Lead Model: 4194
Implantable Pulse Generator Implant Date: 20131024
Lead Channel Impedance Value: 304 Ohm
Lead Channel Impedance Value: 418 Ohm
Lead Channel Impedance Value: 437 Ohm
Lead Channel Impedance Value: 475 Ohm
Lead Channel Impedance Value: 475 Ohm
Lead Channel Impedance Value: 494 Ohm
Lead Channel Impedance Value: 570 Ohm
Lead Channel Impedance Value: 608 Ohm
Lead Channel Impedance Value: 665 Ohm
Lead Channel Pacing Threshold Amplitude: 0.75 V
Lead Channel Pacing Threshold Amplitude: 0.75 V
Lead Channel Pacing Threshold Amplitude: 1.25 V
Lead Channel Pacing Threshold Pulse Width: 0.4 ms
Lead Channel Pacing Threshold Pulse Width: 0.4 ms
Lead Channel Pacing Threshold Pulse Width: 0.4 ms
Lead Channel Sensing Intrinsic Amplitude: 1.875 mV
Lead Channel Sensing Intrinsic Amplitude: 5.625 mV
Lead Channel Setting Pacing Amplitude: 2 V
Lead Channel Setting Pacing Amplitude: 2 V
Lead Channel Setting Pacing Amplitude: 2.5 V
Lead Channel Setting Pacing Pulse Width: 0.4 ms
Lead Channel Setting Pacing Pulse Width: 0.4 ms
Lead Channel Setting Sensing Sensitivity: 2.8 mV

## 2019-12-02 NOTE — Telephone Encounter (Signed)
Spoke with patient regarding [employer] monitoring vs 6 month f/u in [place]. Pt reports she does not have a monitor currently. Requests that I call her daughter, Hilda Blades, to discuss.   LMOVM for Hilda Blades requesting call back to [place]. Direct number and office hours provided.

## 2019-12-02 NOTE — Telephone Encounter (Signed)
The pt daughter is open to the pt being monitor with a [employer] monitor. I told her we will get the monitor ordered and sent to them.

## 2019-12-05 DIAGNOSIS — R2681 Unsteadiness on feet: Secondary | ICD-10-CM | POA: Diagnosis not present

## 2019-12-05 DIAGNOSIS — M6281 Muscle weakness (generalized): Secondary | ICD-10-CM | POA: Diagnosis not present

## 2019-12-06 NOTE — Telephone Encounter (Signed)
The pt daughter called stating the address is: [address] [place], [place] [address]. If you need to call her back that is fine.

## 2019-12-06 NOTE — Telephone Encounter (Signed)
LMOVM for [name] (D[facility]) requesting call back to [place] to confirm patient's mailing address. Direct number and office hours provided.

## 2019-12-08 NOTE — Telephone Encounter (Signed)
New [employer] monitor ordered 12/08/19.

## 2019-12-09 DIAGNOSIS — R2681 Unsteadiness on feet: Secondary | ICD-10-CM | POA: Diagnosis not present

## 2019-12-09 DIAGNOSIS — M6281 Muscle weakness (generalized): Secondary | ICD-10-CM | POA: Diagnosis not present

## 2019-12-12 DIAGNOSIS — M6281 Muscle weakness (generalized): Secondary | ICD-10-CM | POA: Diagnosis not present

## 2019-12-12 DIAGNOSIS — R2681 Unsteadiness on feet: Secondary | ICD-10-CM | POA: Diagnosis not present

## 2019-12-16 DIAGNOSIS — M6281 Muscle weakness (generalized): Secondary | ICD-10-CM | POA: Diagnosis not present

## 2019-12-16 DIAGNOSIS — R2681 Unsteadiness on feet: Secondary | ICD-10-CM | POA: Diagnosis not present

## 2019-12-16 NOTE — Telephone Encounter (Signed)
Monitor shipped 12/09/19 per [employer].

## 2019-12-20 DIAGNOSIS — M6281 Muscle weakness (generalized): Secondary | ICD-10-CM | POA: Diagnosis not present

## 2019-12-20 DIAGNOSIS — R2681 Unsteadiness on feet: Secondary | ICD-10-CM | POA: Diagnosis not present

## 2019-12-23 DIAGNOSIS — R2681 Unsteadiness on feet: Secondary | ICD-10-CM | POA: Diagnosis not present

## 2019-12-23 DIAGNOSIS — M6281 Muscle weakness (generalized): Secondary | ICD-10-CM | POA: Diagnosis not present

## 2020-02-14 ENCOUNTER — Ambulatory Visit (INDEPENDENT_AMBULATORY_CARE_PROVIDER_SITE_OTHER): Payer: Medicare Other | Admitting: *Deleted

## 2020-02-14 DIAGNOSIS — I255 Ischemic cardiomyopathy: Secondary | ICD-10-CM | POA: Diagnosis not present

## 2020-02-14 DIAGNOSIS — I447 Left bundle-branch block, unspecified: Secondary | ICD-10-CM

## 2020-02-15 DIAGNOSIS — M6281 Muscle weakness (generalized): Secondary | ICD-10-CM | POA: Diagnosis not present

## 2020-02-15 DIAGNOSIS — R2681 Unsteadiness on feet: Secondary | ICD-10-CM | POA: Diagnosis not present

## 2020-02-15 LAB — CUP PACEART REMOTE DEVICE CHECK
Battery Remaining Longevity: 28 mo
Battery Voltage: 2.93 V
Brady Statistic AP VP Percent: 0.17 %
Brady Statistic AP VS Percent: 0 %
Brady Statistic AS VP Percent: 96.93 %
Brady Statistic AS VS Percent: 2.9 %
Brady Statistic RA Percent Paced: 0.17 %
Brady Statistic RV Percent Paced: 88.7 %
Date Time Interrogation Session: 20210713180520
Implantable Lead Implant Date: 20131024
Implantable Lead Implant Date: 20131024
Implantable Lead Implant Date: 20131028
Implantable Lead Location: 753858
Implantable Lead Location: 753859
Implantable Lead Location: 753860
Implantable Lead Model: 1944
Implantable Lead Model: 1948
Implantable Lead Model: 4194
Implantable Pulse Generator Implant Date: 20131024
Lead Channel Impedance Value: 323 Ohm
Lead Channel Impedance Value: 437 Ohm
Lead Channel Impedance Value: 437 Ohm
Lead Channel Impedance Value: 456 Ohm
Lead Channel Impedance Value: 494 Ohm
Lead Channel Impedance Value: 494 Ohm
Lead Channel Impedance Value: 551 Ohm
Lead Channel Impedance Value: 608 Ohm
Lead Channel Impedance Value: 646 Ohm
Lead Channel Pacing Threshold Amplitude: 0.75 V
Lead Channel Pacing Threshold Amplitude: 0.875 V
Lead Channel Pacing Threshold Pulse Width: 0.4 ms
Lead Channel Pacing Threshold Pulse Width: 0.4 ms
Lead Channel Sensing Intrinsic Amplitude: 2 mV
Lead Channel Sensing Intrinsic Amplitude: 2 mV
Lead Channel Sensing Intrinsic Amplitude: 5.5 mV
Lead Channel Sensing Intrinsic Amplitude: 5.5 mV
Lead Channel Setting Pacing Amplitude: 2 V
Lead Channel Setting Pacing Amplitude: 2 V
Lead Channel Setting Pacing Amplitude: 2.5 V
Lead Channel Setting Pacing Pulse Width: 0.4 ms
Lead Channel Setting Pacing Pulse Width: 0.4 ms
Lead Channel Setting Sensing Sensitivity: 2.8 mV

## 2020-02-15 NOTE — Progress Notes (Signed)
Remote pacemaker transmission.   

## 2020-02-28 DIAGNOSIS — M179 Osteoarthritis of knee, unspecified: Secondary | ICD-10-CM | POA: Diagnosis not present

## 2020-03-07 DIAGNOSIS — M1712 Unilateral primary osteoarthritis, left knee: Secondary | ICD-10-CM | POA: Diagnosis not present

## 2020-03-07 DIAGNOSIS — M1711 Unilateral primary osteoarthritis, right knee: Secondary | ICD-10-CM | POA: Diagnosis not present

## 2020-04-18 DIAGNOSIS — M1711 Unilateral primary osteoarthritis, right knee: Secondary | ICD-10-CM | POA: Diagnosis not present

## 2020-04-18 DIAGNOSIS — M25561 Pain in right knee: Secondary | ICD-10-CM | POA: Diagnosis not present

## 2020-04-18 DIAGNOSIS — M25562 Pain in left knee: Secondary | ICD-10-CM | POA: Diagnosis not present

## 2020-04-18 DIAGNOSIS — M1712 Unilateral primary osteoarthritis, left knee: Secondary | ICD-10-CM | POA: Diagnosis not present

## 2020-04-25 DIAGNOSIS — M1712 Unilateral primary osteoarthritis, left knee: Secondary | ICD-10-CM | POA: Diagnosis not present

## 2020-04-25 DIAGNOSIS — M1711 Unilateral primary osteoarthritis, right knee: Secondary | ICD-10-CM | POA: Diagnosis not present

## 2020-05-01 DIAGNOSIS — Z23 Encounter for immunization: Secondary | ICD-10-CM | POA: Diagnosis not present

## 2020-05-02 DIAGNOSIS — M1712 Unilateral primary osteoarthritis, left knee: Secondary | ICD-10-CM | POA: Diagnosis not present

## 2020-05-02 DIAGNOSIS — M1711 Unilateral primary osteoarthritis, right knee: Secondary | ICD-10-CM | POA: Diagnosis not present

## 2020-05-15 ENCOUNTER — Ambulatory Visit (INDEPENDENT_AMBULATORY_CARE_PROVIDER_SITE_OTHER): Payer: Medicare Other

## 2020-05-15 DIAGNOSIS — I255 Ischemic cardiomyopathy: Secondary | ICD-10-CM | POA: Diagnosis not present

## 2020-05-15 LAB — CUP PACEART REMOTE DEVICE CHECK
Battery Remaining Longevity: 24 mo
Battery Voltage: 2.91 V
Brady Statistic AP VP Percent: 0.13 %
Brady Statistic AP VS Percent: 0 %
Brady Statistic AS VP Percent: 98.15 %
Brady Statistic AS VS Percent: 1.72 %
Brady Statistic RA Percent Paced: 0.13 %
Brady Statistic RV Percent Paced: 92.36 %
Date Time Interrogation Session: 20211012174936
Implantable Lead Implant Date: 20131024
Implantable Lead Implant Date: 20131024
Implantable Lead Implant Date: 20131028
Implantable Lead Location: 753858
Implantable Lead Location: 753859
Implantable Lead Location: 753860
Implantable Lead Model: 1944
Implantable Lead Model: 1948
Implantable Lead Model: 4194
Implantable Pulse Generator Implant Date: 20131024
Lead Channel Impedance Value: 304 Ohm
Lead Channel Impedance Value: 399 Ohm
Lead Channel Impedance Value: 418 Ohm
Lead Channel Impedance Value: 456 Ohm
Lead Channel Impedance Value: 475 Ohm
Lead Channel Impedance Value: 475 Ohm
Lead Channel Impedance Value: 551 Ohm
Lead Channel Impedance Value: 608 Ohm
Lead Channel Impedance Value: 646 Ohm
Lead Channel Pacing Threshold Amplitude: 0.75 V
Lead Channel Pacing Threshold Amplitude: 1 V
Lead Channel Pacing Threshold Pulse Width: 0.4 ms
Lead Channel Pacing Threshold Pulse Width: 0.4 ms
Lead Channel Sensing Intrinsic Amplitude: 2 mV
Lead Channel Sensing Intrinsic Amplitude: 2 mV
Lead Channel Sensing Intrinsic Amplitude: 5.375 mV
Lead Channel Sensing Intrinsic Amplitude: 5.375 mV
Lead Channel Setting Pacing Amplitude: 2 V
Lead Channel Setting Pacing Amplitude: 2.25 V
Lead Channel Setting Pacing Amplitude: 2.5 V
Lead Channel Setting Pacing Pulse Width: 0.4 ms
Lead Channel Setting Pacing Pulse Width: 0.4 ms
Lead Channel Setting Sensing Sensitivity: 2.8 mV

## 2020-05-18 NOTE — Progress Notes (Signed)
Remote pacemaker transmission.   

## 2020-06-01 DIAGNOSIS — M1711 Unilateral primary osteoarthritis, right knee: Secondary | ICD-10-CM | POA: Diagnosis not present

## 2020-06-04 DIAGNOSIS — H353210 Exudative age-related macular degeneration, right eye, stage unspecified: Secondary | ICD-10-CM | POA: Diagnosis not present

## 2020-06-04 DIAGNOSIS — E7800 Pure hypercholesterolemia, unspecified: Secondary | ICD-10-CM | POA: Diagnosis not present

## 2020-06-04 DIAGNOSIS — I255 Ischemic cardiomyopathy: Secondary | ICD-10-CM | POA: Diagnosis not present

## 2020-06-04 DIAGNOSIS — E559 Vitamin D deficiency, unspecified: Secondary | ICD-10-CM | POA: Diagnosis not present

## 2020-06-04 DIAGNOSIS — Z0000 Encounter for general adult medical examination without abnormal findings: Secondary | ICD-10-CM | POA: Diagnosis not present

## 2020-06-04 DIAGNOSIS — D692 Other nonthrombocytopenic purpura: Secondary | ICD-10-CM | POA: Diagnosis not present

## 2020-06-04 DIAGNOSIS — I5020 Unspecified systolic (congestive) heart failure: Secondary | ICD-10-CM | POA: Diagnosis not present

## 2020-06-04 DIAGNOSIS — M1990 Unspecified osteoarthritis, unspecified site: Secondary | ICD-10-CM | POA: Diagnosis not present

## 2020-06-04 DIAGNOSIS — I25119 Atherosclerotic heart disease of native coronary artery with unspecified angina pectoris: Secondary | ICD-10-CM | POA: Diagnosis not present

## 2020-06-04 DIAGNOSIS — E538 Deficiency of other specified B group vitamins: Secondary | ICD-10-CM | POA: Diagnosis not present

## 2020-06-04 DIAGNOSIS — I208 Other forms of angina pectoris: Secondary | ICD-10-CM | POA: Diagnosis not present

## 2020-06-04 DIAGNOSIS — F331 Major depressive disorder, recurrent, moderate: Secondary | ICD-10-CM | POA: Diagnosis not present

## 2020-06-08 DIAGNOSIS — R262 Difficulty in walking, not elsewhere classified: Secondary | ICD-10-CM | POA: Diagnosis not present

## 2020-06-08 DIAGNOSIS — M25561 Pain in right knee: Secondary | ICD-10-CM | POA: Diagnosis not present

## 2020-06-08 DIAGNOSIS — M6281 Muscle weakness (generalized): Secondary | ICD-10-CM | POA: Diagnosis not present

## 2020-06-08 DIAGNOSIS — M170 Bilateral primary osteoarthritis of knee: Secondary | ICD-10-CM | POA: Diagnosis not present

## 2020-06-11 DIAGNOSIS — M25561 Pain in right knee: Secondary | ICD-10-CM | POA: Diagnosis not present

## 2020-06-11 DIAGNOSIS — M170 Bilateral primary osteoarthritis of knee: Secondary | ICD-10-CM | POA: Diagnosis not present

## 2020-06-11 DIAGNOSIS — M6281 Muscle weakness (generalized): Secondary | ICD-10-CM | POA: Diagnosis not present

## 2020-06-11 DIAGNOSIS — R262 Difficulty in walking, not elsewhere classified: Secondary | ICD-10-CM | POA: Diagnosis not present

## 2020-06-13 DIAGNOSIS — M25561 Pain in right knee: Secondary | ICD-10-CM | POA: Diagnosis not present

## 2020-06-13 DIAGNOSIS — R262 Difficulty in walking, not elsewhere classified: Secondary | ICD-10-CM | POA: Diagnosis not present

## 2020-06-13 DIAGNOSIS — M6281 Muscle weakness (generalized): Secondary | ICD-10-CM | POA: Diagnosis not present

## 2020-06-13 DIAGNOSIS — M170 Bilateral primary osteoarthritis of knee: Secondary | ICD-10-CM | POA: Diagnosis not present

## 2020-06-15 DIAGNOSIS — M6281 Muscle weakness (generalized): Secondary | ICD-10-CM | POA: Diagnosis not present

## 2020-06-15 DIAGNOSIS — R262 Difficulty in walking, not elsewhere classified: Secondary | ICD-10-CM | POA: Diagnosis not present

## 2020-06-15 DIAGNOSIS — M170 Bilateral primary osteoarthritis of knee: Secondary | ICD-10-CM | POA: Diagnosis not present

## 2020-06-15 DIAGNOSIS — M25561 Pain in right knee: Secondary | ICD-10-CM | POA: Diagnosis not present

## 2020-06-18 DIAGNOSIS — M170 Bilateral primary osteoarthritis of knee: Secondary | ICD-10-CM | POA: Diagnosis not present

## 2020-06-18 DIAGNOSIS — M6281 Muscle weakness (generalized): Secondary | ICD-10-CM | POA: Diagnosis not present

## 2020-06-18 DIAGNOSIS — R262 Difficulty in walking, not elsewhere classified: Secondary | ICD-10-CM | POA: Diagnosis not present

## 2020-06-18 DIAGNOSIS — M25561 Pain in right knee: Secondary | ICD-10-CM | POA: Diagnosis not present

## 2020-06-18 DIAGNOSIS — Z23 Encounter for immunization: Secondary | ICD-10-CM | POA: Diagnosis not present

## 2020-06-20 DIAGNOSIS — M6281 Muscle weakness (generalized): Secondary | ICD-10-CM | POA: Diagnosis not present

## 2020-06-20 DIAGNOSIS — M170 Bilateral primary osteoarthritis of knee: Secondary | ICD-10-CM | POA: Diagnosis not present

## 2020-06-20 DIAGNOSIS — M25561 Pain in right knee: Secondary | ICD-10-CM | POA: Diagnosis not present

## 2020-06-20 DIAGNOSIS — R262 Difficulty in walking, not elsewhere classified: Secondary | ICD-10-CM | POA: Diagnosis not present

## 2020-06-22 DIAGNOSIS — M25561 Pain in right knee: Secondary | ICD-10-CM | POA: Diagnosis not present

## 2020-06-22 DIAGNOSIS — M170 Bilateral primary osteoarthritis of knee: Secondary | ICD-10-CM | POA: Diagnosis not present

## 2020-06-22 DIAGNOSIS — R262 Difficulty in walking, not elsewhere classified: Secondary | ICD-10-CM | POA: Diagnosis not present

## 2020-06-22 DIAGNOSIS — M6281 Muscle weakness (generalized): Secondary | ICD-10-CM | POA: Diagnosis not present

## 2020-06-25 DIAGNOSIS — M6281 Muscle weakness (generalized): Secondary | ICD-10-CM | POA: Diagnosis not present

## 2020-06-25 DIAGNOSIS — M170 Bilateral primary osteoarthritis of knee: Secondary | ICD-10-CM | POA: Diagnosis not present

## 2020-06-25 DIAGNOSIS — R262 Difficulty in walking, not elsewhere classified: Secondary | ICD-10-CM | POA: Diagnosis not present

## 2020-06-25 DIAGNOSIS — M25561 Pain in right knee: Secondary | ICD-10-CM | POA: Diagnosis not present

## 2020-07-01 DIAGNOSIS — Z853 Personal history of malignant neoplasm of breast: Secondary | ICD-10-CM | POA: Diagnosis not present

## 2020-07-01 DIAGNOSIS — K219 Gastro-esophageal reflux disease without esophagitis: Secondary | ICD-10-CM | POA: Diagnosis not present

## 2020-07-01 DIAGNOSIS — N183 Chronic kidney disease, stage 3 unspecified: Secondary | ICD-10-CM | POA: Diagnosis not present

## 2020-07-01 DIAGNOSIS — I25119 Atherosclerotic heart disease of native coronary artery with unspecified angina pectoris: Secondary | ICD-10-CM | POA: Diagnosis not present

## 2020-07-01 DIAGNOSIS — M179 Osteoarthritis of knee, unspecified: Secondary | ICD-10-CM | POA: Diagnosis not present

## 2020-07-01 DIAGNOSIS — I208 Other forms of angina pectoris: Secondary | ICD-10-CM | POA: Diagnosis not present

## 2020-07-01 DIAGNOSIS — M1990 Unspecified osteoarthritis, unspecified site: Secondary | ICD-10-CM | POA: Diagnosis not present

## 2020-07-01 DIAGNOSIS — I255 Ischemic cardiomyopathy: Secondary | ICD-10-CM | POA: Diagnosis not present

## 2020-07-01 DIAGNOSIS — I5020 Unspecified systolic (congestive) heart failure: Secondary | ICD-10-CM | POA: Diagnosis not present

## 2020-07-01 DIAGNOSIS — M8580 Other specified disorders of bone density and structure, unspecified site: Secondary | ICD-10-CM | POA: Diagnosis not present

## 2020-07-01 DIAGNOSIS — E7800 Pure hypercholesterolemia, unspecified: Secondary | ICD-10-CM | POA: Diagnosis not present

## 2020-07-01 DIAGNOSIS — F331 Major depressive disorder, recurrent, moderate: Secondary | ICD-10-CM | POA: Diagnosis not present

## 2020-07-04 DIAGNOSIS — M6281 Muscle weakness (generalized): Secondary | ICD-10-CM | POA: Diagnosis not present

## 2020-07-04 DIAGNOSIS — M25561 Pain in right knee: Secondary | ICD-10-CM | POA: Diagnosis not present

## 2020-07-04 DIAGNOSIS — M170 Bilateral primary osteoarthritis of knee: Secondary | ICD-10-CM | POA: Diagnosis not present

## 2020-07-04 DIAGNOSIS — R262 Difficulty in walking, not elsewhere classified: Secondary | ICD-10-CM | POA: Diagnosis not present

## 2020-07-06 DIAGNOSIS — R262 Difficulty in walking, not elsewhere classified: Secondary | ICD-10-CM | POA: Diagnosis not present

## 2020-07-06 DIAGNOSIS — M6281 Muscle weakness (generalized): Secondary | ICD-10-CM | POA: Diagnosis not present

## 2020-07-06 DIAGNOSIS — M25561 Pain in right knee: Secondary | ICD-10-CM | POA: Diagnosis not present

## 2020-07-06 DIAGNOSIS — M170 Bilateral primary osteoarthritis of knee: Secondary | ICD-10-CM | POA: Diagnosis not present

## 2020-07-11 DIAGNOSIS — M25561 Pain in right knee: Secondary | ICD-10-CM | POA: Diagnosis not present

## 2020-07-11 DIAGNOSIS — R262 Difficulty in walking, not elsewhere classified: Secondary | ICD-10-CM | POA: Diagnosis not present

## 2020-07-11 DIAGNOSIS — M170 Bilateral primary osteoarthritis of knee: Secondary | ICD-10-CM | POA: Diagnosis not present

## 2020-07-11 DIAGNOSIS — M6281 Muscle weakness (generalized): Secondary | ICD-10-CM | POA: Diagnosis not present

## 2020-07-13 DIAGNOSIS — M25561 Pain in right knee: Secondary | ICD-10-CM | POA: Diagnosis not present

## 2020-07-13 DIAGNOSIS — R262 Difficulty in walking, not elsewhere classified: Secondary | ICD-10-CM | POA: Diagnosis not present

## 2020-07-13 DIAGNOSIS — M6281 Muscle weakness (generalized): Secondary | ICD-10-CM | POA: Diagnosis not present

## 2020-07-13 DIAGNOSIS — M170 Bilateral primary osteoarthritis of knee: Secondary | ICD-10-CM | POA: Diagnosis not present

## 2020-07-18 DIAGNOSIS — M6281 Muscle weakness (generalized): Secondary | ICD-10-CM | POA: Diagnosis not present

## 2020-07-18 DIAGNOSIS — R262 Difficulty in walking, not elsewhere classified: Secondary | ICD-10-CM | POA: Diagnosis not present

## 2020-07-18 DIAGNOSIS — M25561 Pain in right knee: Secondary | ICD-10-CM | POA: Diagnosis not present

## 2020-07-18 DIAGNOSIS — M170 Bilateral primary osteoarthritis of knee: Secondary | ICD-10-CM | POA: Diagnosis not present

## 2020-07-20 DIAGNOSIS — R262 Difficulty in walking, not elsewhere classified: Secondary | ICD-10-CM | POA: Diagnosis not present

## 2020-07-20 DIAGNOSIS — M170 Bilateral primary osteoarthritis of knee: Secondary | ICD-10-CM | POA: Diagnosis not present

## 2020-07-20 DIAGNOSIS — M25561 Pain in right knee: Secondary | ICD-10-CM | POA: Diagnosis not present

## 2020-07-20 DIAGNOSIS — M6281 Muscle weakness (generalized): Secondary | ICD-10-CM | POA: Diagnosis not present

## 2020-07-23 DIAGNOSIS — R262 Difficulty in walking, not elsewhere classified: Secondary | ICD-10-CM | POA: Diagnosis not present

## 2020-07-23 DIAGNOSIS — M170 Bilateral primary osteoarthritis of knee: Secondary | ICD-10-CM | POA: Diagnosis not present

## 2020-07-23 DIAGNOSIS — M25561 Pain in right knee: Secondary | ICD-10-CM | POA: Diagnosis not present

## 2020-07-23 DIAGNOSIS — M6281 Muscle weakness (generalized): Secondary | ICD-10-CM | POA: Diagnosis not present

## 2020-07-30 DIAGNOSIS — R262 Difficulty in walking, not elsewhere classified: Secondary | ICD-10-CM | POA: Diagnosis not present

## 2020-07-30 DIAGNOSIS — M6281 Muscle weakness (generalized): Secondary | ICD-10-CM | POA: Diagnosis not present

## 2020-07-30 DIAGNOSIS — M170 Bilateral primary osteoarthritis of knee: Secondary | ICD-10-CM | POA: Diagnosis not present

## 2020-07-30 DIAGNOSIS — M25561 Pain in right knee: Secondary | ICD-10-CM | POA: Diagnosis not present

## 2020-08-02 DIAGNOSIS — M170 Bilateral primary osteoarthritis of knee: Secondary | ICD-10-CM | POA: Diagnosis not present

## 2020-08-02 DIAGNOSIS — R262 Difficulty in walking, not elsewhere classified: Secondary | ICD-10-CM | POA: Diagnosis not present

## 2020-08-02 DIAGNOSIS — M25561 Pain in right knee: Secondary | ICD-10-CM | POA: Diagnosis not present

## 2020-08-02 DIAGNOSIS — M6281 Muscle weakness (generalized): Secondary | ICD-10-CM | POA: Diagnosis not present

## 2020-08-06 DIAGNOSIS — M6281 Muscle weakness (generalized): Secondary | ICD-10-CM | POA: Diagnosis not present

## 2020-08-06 DIAGNOSIS — R262 Difficulty in walking, not elsewhere classified: Secondary | ICD-10-CM | POA: Diagnosis not present

## 2020-08-06 DIAGNOSIS — M25561 Pain in right knee: Secondary | ICD-10-CM | POA: Diagnosis not present

## 2020-08-06 DIAGNOSIS — R2681 Unsteadiness on feet: Secondary | ICD-10-CM | POA: Diagnosis not present

## 2020-08-08 DIAGNOSIS — R2681 Unsteadiness on feet: Secondary | ICD-10-CM | POA: Diagnosis not present

## 2020-08-08 DIAGNOSIS — M25561 Pain in right knee: Secondary | ICD-10-CM | POA: Diagnosis not present

## 2020-08-08 DIAGNOSIS — M6281 Muscle weakness (generalized): Secondary | ICD-10-CM | POA: Diagnosis not present

## 2020-08-08 DIAGNOSIS — R262 Difficulty in walking, not elsewhere classified: Secondary | ICD-10-CM | POA: Diagnosis not present

## 2020-08-13 DIAGNOSIS — R262 Difficulty in walking, not elsewhere classified: Secondary | ICD-10-CM | POA: Diagnosis not present

## 2020-08-13 DIAGNOSIS — R2681 Unsteadiness on feet: Secondary | ICD-10-CM | POA: Diagnosis not present

## 2020-08-13 DIAGNOSIS — M6281 Muscle weakness (generalized): Secondary | ICD-10-CM | POA: Diagnosis not present

## 2020-08-13 DIAGNOSIS — M25561 Pain in right knee: Secondary | ICD-10-CM | POA: Diagnosis not present

## 2020-08-14 ENCOUNTER — Ambulatory Visit (INDEPENDENT_AMBULATORY_CARE_PROVIDER_SITE_OTHER): Payer: Medicare Other

## 2020-08-14 DIAGNOSIS — I5022 Chronic systolic (congestive) heart failure: Secondary | ICD-10-CM

## 2020-08-14 DIAGNOSIS — I255 Ischemic cardiomyopathy: Secondary | ICD-10-CM

## 2020-08-15 DIAGNOSIS — M6281 Muscle weakness (generalized): Secondary | ICD-10-CM | POA: Diagnosis not present

## 2020-08-15 DIAGNOSIS — M25561 Pain in right knee: Secondary | ICD-10-CM | POA: Diagnosis not present

## 2020-08-15 DIAGNOSIS — R2681 Unsteadiness on feet: Secondary | ICD-10-CM | POA: Diagnosis not present

## 2020-08-15 DIAGNOSIS — R262 Difficulty in walking, not elsewhere classified: Secondary | ICD-10-CM | POA: Diagnosis not present

## 2020-08-15 LAB — CUP PACEART REMOTE DEVICE CHECK
Battery Remaining Longevity: 21 mo
Battery Voltage: 2.91 V
Brady Statistic AP VP Percent: 0.12 %
Brady Statistic AP VS Percent: 0 %
Brady Statistic AS VP Percent: 98.49 %
Brady Statistic AS VS Percent: 1.38 %
Brady Statistic RA Percent Paced: 0.12 %
Brady Statistic RV Percent Paced: 92.75 %
Date Time Interrogation Session: 20220111192308
Implantable Lead Implant Date: 20131024
Implantable Lead Implant Date: 20131024
Implantable Lead Implant Date: 20131028
Implantable Lead Location: 753858
Implantable Lead Location: 753859
Implantable Lead Location: 753860
Implantable Lead Model: 1944
Implantable Lead Model: 1948
Implantable Lead Model: 4194
Implantable Pulse Generator Implant Date: 20131024
Lead Channel Impedance Value: 304 Ohm
Lead Channel Impedance Value: 418 Ohm
Lead Channel Impedance Value: 418 Ohm
Lead Channel Impedance Value: 456 Ohm
Lead Channel Impedance Value: 475 Ohm
Lead Channel Impedance Value: 475 Ohm
Lead Channel Impedance Value: 551 Ohm
Lead Channel Impedance Value: 608 Ohm
Lead Channel Impedance Value: 646 Ohm
Lead Channel Pacing Threshold Amplitude: 0.625 V
Lead Channel Pacing Threshold Amplitude: 0.875 V
Lead Channel Pacing Threshold Pulse Width: 0.4 ms
Lead Channel Pacing Threshold Pulse Width: 0.4 ms
Lead Channel Sensing Intrinsic Amplitude: 1.875 mV
Lead Channel Sensing Intrinsic Amplitude: 1.875 mV
Lead Channel Sensing Intrinsic Amplitude: 4.625 mV
Lead Channel Sensing Intrinsic Amplitude: 4.625 mV
Lead Channel Setting Pacing Amplitude: 2 V
Lead Channel Setting Pacing Amplitude: 2 V
Lead Channel Setting Pacing Amplitude: 2.5 V
Lead Channel Setting Pacing Pulse Width: 0.4 ms
Lead Channel Setting Pacing Pulse Width: 0.4 ms
Lead Channel Setting Sensing Sensitivity: 2.8 mV

## 2020-08-20 DIAGNOSIS — M6281 Muscle weakness (generalized): Secondary | ICD-10-CM | POA: Diagnosis not present

## 2020-08-20 DIAGNOSIS — R2681 Unsteadiness on feet: Secondary | ICD-10-CM | POA: Diagnosis not present

## 2020-08-20 DIAGNOSIS — R262 Difficulty in walking, not elsewhere classified: Secondary | ICD-10-CM | POA: Diagnosis not present

## 2020-08-20 DIAGNOSIS — M25561 Pain in right knee: Secondary | ICD-10-CM | POA: Diagnosis not present

## 2020-08-22 ENCOUNTER — Emergency Department (HOSPITAL_COMMUNITY)
Admission: EM | Admit: 2020-08-22 | Discharge: 2020-08-23 | Disposition: A | Discharge status: Home / Self Care | Payer: Medicare Other | Attending: Emergency Medicine | Admitting: Emergency Medicine

## 2020-08-22 DIAGNOSIS — S41111A Laceration without foreign body of right upper arm, initial encounter: Secondary | ICD-10-CM | POA: Diagnosis not present

## 2020-08-22 DIAGNOSIS — M79631 Pain in right forearm: Secondary | ICD-10-CM | POA: Diagnosis not present

## 2020-08-22 DIAGNOSIS — W19XXXA Unspecified fall, initial encounter: Secondary | ICD-10-CM | POA: Diagnosis not present

## 2020-08-22 DIAGNOSIS — W01198A Fall on same level from slipping, tripping and stumbling with subsequent striking against other object, initial encounter: Secondary | ICD-10-CM | POA: Diagnosis not present

## 2020-08-22 DIAGNOSIS — S01111A Laceration without foreign body of right eyelid and periocular area, initial encounter: Secondary | ICD-10-CM | POA: Insufficient documentation

## 2020-08-22 DIAGNOSIS — I6782 Cerebral ischemia: Secondary | ICD-10-CM | POA: Diagnosis not present

## 2020-08-22 DIAGNOSIS — R262 Difficulty in walking, not elsewhere classified: Secondary | ICD-10-CM | POA: Diagnosis not present

## 2020-08-22 DIAGNOSIS — S0591XA Unspecified injury of right eye and orbit, initial encounter: Secondary | ICD-10-CM | POA: Diagnosis present

## 2020-08-22 DIAGNOSIS — Y9301 Activity, walking, marching and hiking: Secondary | ICD-10-CM | POA: Insufficient documentation

## 2020-08-22 DIAGNOSIS — M7989 Other specified soft tissue disorders: Secondary | ICD-10-CM | POA: Diagnosis not present

## 2020-08-22 DIAGNOSIS — M25561 Pain in right knee: Secondary | ICD-10-CM | POA: Diagnosis not present

## 2020-08-22 DIAGNOSIS — S0083XA Contusion of other part of head, initial encounter: Secondary | ICD-10-CM | POA: Diagnosis not present

## 2020-08-22 DIAGNOSIS — M6281 Muscle weakness (generalized): Secondary | ICD-10-CM | POA: Diagnosis not present

## 2020-08-22 DIAGNOSIS — R9431 Abnormal electrocardiogram [ECG] [EKG]: Secondary | ICD-10-CM | POA: Diagnosis not present

## 2020-08-22 DIAGNOSIS — Z7901 Long term (current) use of anticoagulants: Secondary | ICD-10-CM | POA: Insufficient documentation

## 2020-08-22 DIAGNOSIS — S41112A Laceration without foreign body of left upper arm, initial encounter: Secondary | ICD-10-CM | POA: Diagnosis not present

## 2020-08-22 DIAGNOSIS — R2681 Unsteadiness on feet: Secondary | ICD-10-CM | POA: Diagnosis not present

## 2020-08-22 DIAGNOSIS — I10 Essential (primary) hypertension: Secondary | ICD-10-CM | POA: Diagnosis not present

## 2020-08-22 NOTE — ED Triage Notes (Signed)
Pt BIB EMS for fall on thinners. Pt reports that she was walking to bathroom with walker and fell. Denies LOC. Pt has laceration on head above the R eye and skin tear on R arm.  EMS reports that pt fell last week and has a bruise on Rn shoulder. Pt is AO and has no complaints.  VS with EMS  168/94 HR 96 97% on RA

## 2020-08-22 NOTE — ED Provider Notes (Signed)
[facility] EMERGENCY DEPARTMENT Provider Note   CSN: [id number] Arrival date & time:        History Chief Complaint  Patient presents with  . Fall    Fall on Plavix    [name] is a 87 y.o. female.  Patient denies passing out.  States that she fell while walking to the bathroom.  She not sure if she slipped on something or lost her balance but she hit her head and her right arm in the process.  EMS was called.  She is has a laceration above her right eye and a skin tear on her right arm.  She is on Plavix.  Able to ambulate.  No deep injuries anywhere per patient.  No associated chest pain, shortness of breath, lightheadedness or other symptoms.  Vital signs within normal limits in route.    No past medical history on file.  There are no problems to display for this patient.      OB History   No obstetric history on file.     No family history on file.     Home Medications Prior to Admission medications   Not on File    Allergies    Patient has no allergy information on record.  Review of Systems   Review of Systems  All other systems reviewed and are negative.   Physical Exam Updated Vital Signs BP (!) 170/67   Pulse 85   Temp 97.9 F (36.6 C) (Oral)   Resp 18   Ht 5\' 3"  (1.6 m)   Wt 74.8 kg   SpO2 95%   BMI 29.23 kg/m   Physical Exam Vitals and nursing note reviewed.  Constitutional:      Appearance: She is well-developed and well-nourished.  HENT:     Head: Normocephalic.     Comments: 2 to 3 cm laceration above the right eyebrow with associated ecchymosis, hemostatic.    Mouth/Throat:     Mouth: Mucous membranes are moist.     Pharynx: Oropharynx is clear.  Cardiovascular:     Rate and Rhythm: Normal rate and regular rhythm.  Pulmonary:     Effort: No respiratory distress.     Breath sounds: No stridor.  Abdominal:     General: Abdomen is flat. There is no distension.  Musculoskeletal:     Cervical back:  Normal range of motion.  Skin:    General: Skin is warm and dry.     Comments: Large skin tear to right arm is hemostatic with dressing in place.  Neurological:     General: No focal deficit present.     Mental Status: She is alert.     ED Results / Procedures / Treatments   Labs (all labs ordered are listed, but only abnormal results are displayed) Labs Reviewed  CBC WITH DIFFERENTIAL/PLATELET - Abnormal; Notable for the following components:      Result Value   RBC 3.52 (*)    Hemoglobin 11.7 (*)    MCV 106.0 (*)    Abs Immature Granulocytes 0.33 (*)    All other components within normal limits  COMPREHENSIVE METABOLIC PANEL - Abnormal; Notable for the following components:   Glucose, Bld 122 (*)    BUN 30 (*)    Creatinine, Ser 1.51 (*)    Total Protein 5.9 (*)    GFR, Estimated 32 (*)    All other components within normal limits  PROTIME-INR    EKG None  Radiology DG Forearm  Right  Result Date: 08/23/2020 CLINICAL DATA:  Pain status post fall. EXAM: RIGHT FOREARM - 2 VIEW COMPARISON:  None. FINDINGS: There is no acute displaced fracture or dislocation. There is soft tissue swelling about the forearm. No evidence for an elbow joint effusion. There is osteopenia. IMPRESSION: No acute displaced fracture or dislocation. Soft tissue swelling about the forearm. Electronically Signed   By: Constance Holster M.D.   On: 08/23/2020 00:30   CT Head Wo Contrast  Result Date: 08/23/2020 CLINICAL DATA:  Fall on blood thinners EXAM: CT HEAD WITHOUT CONTRAST TECHNIQUE: Contiguous axial images were obtained from the base of the skull through the vertex without intravenous contrast. COMPARISON:  None. FINDINGS: Brain: No evidence of acute territorial infarction, hemorrhage, hydrocephalus,extra-axial collection or mass lesion/mass effect. There is dilatation the ventricles and sulci consistent with age-related atrophy. Low-attenuation changes in the deep white matter consistent with small  vessel ischemia. Vascular: No hyperdense vessel or unexpected calcification. Skull: The skull is intact. No fracture or focal lesion identified. Sinuses/Orbits: The visualized paranasal sinuses and mastoid air cells are clear. The orbits and globes intact. Other: Small soft tissue hematoma overlying the right frontal skull IMPRESSION: No acute intracranial abnormality. Findings consistent with age related atrophy and chronic small vessel ischemia Small soft tissue hematoma overlying the right frontal skull Electronically Signed   By: Prudencio Pair M.D.   On: 08/23/2020 00:45    Procedures .Marland KitchenLaceration Repair  Date/Time: 08/23/2020 1:44 AM Performed by: Merrily Pew, MD Authorized by: Merrily Pew, MD   Consent:    Consent obtained:  Verbal   Consent given by:  Patient   Risks, benefits, and alternatives were discussed: yes     Risks discussed:  Infection, need for additional repair, nerve damage, poor wound healing, poor cosmetic result, pain, retained foreign body, tendon damage and vascular damage   Alternatives discussed:  No treatment, delayed treatment and observation Universal protocol:    Procedure explained and questions answered to patient or proxy's satisfaction: yes     Patient identity confirmed:  Verbally with patient and arm band Anesthesia:    Anesthesia method:  Local infiltration   Local anesthetic:  Lidocaine 2% WITH epi Laceration details:    Location:  Face   Face location:  R eyebrow   Length (cm):  3   Depth (mm):  5 Pre-procedure details:    Preparation:  Patient was prepped and draped in usual sterile fashion and imaging obtained to evaluate for foreign bodies Exploration:    Limited defect created (wound extended): no     Imaging obtained comment:  Ct   Imaging outcome: foreign body not noted     Wound exploration: wound explored through full range of motion     Contaminated: no   Treatment:    Area cleansed with:  Saline   Amount of cleaning:   Extensive   Irrigation solution:  Sterile water   Irrigation volume:  150   Irrigation method:  Syringe   Visualized foreign bodies/material removed: no     Debridement:  None   Undermining:  None   Scar revision: no     Layers/structures repaired:  Deep subcutaneous Deep subcutaneous:    Suture size:  5-0   Suture material:  Plain gut   Suture technique:  Simple interrupted   Number of sutures:  6 Skin repair:    Repair method:  Sutures Approximation:    Approximation:  Loose Repair type:    Repair type:  Simple Post-procedure details:  Dressing:  Antibiotic ointment   Procedure completion:  Tolerated well, no immediate complications   (including critical care time)  Medications Ordered in ED Medications  lidocaine-EPINEPHrine (XYLOCAINE W/EPI) 1 %-1:100000 (with pres) injection 20 mL (has no administration in time range)    ED Course  I have reviewed the triage vital signs and the nursing notes.  Pertinent labs & imaging results that were available during my care of the patient were reviewed by me and considered in my medical decision making (see chart for details).    MDM Rules/Calculators/A&P                         Repair wounds as indicated. Head ct. xr r arm. Ecg/labs to ensure no obvious abnormality related to fall.  Wound on forehead repaired as above.  Wound to right arm not repaired 2/2 thin skin, missing skin, edema all making a cosmetic outcome to be similar or worse as just close approximation and pressure dressing. Family and patient agreed.   Final Clinical Impression(s) / ED Diagnoses Final diagnoses:  Fall, initial encounter  Laceration of right eyebrow, initial encounter  Skin tear of right upper arm without complication, initial encounter    Rx / DC Orders ED Discharge Orders    None       Viona Hosking, Corene Cornea, MD 08/23/20 [phone]

## 2020-08-22 NOTE — Consult Note (Signed)
Responded to page, pt unavailable, no family present, staff will page again if further need of chaplain services.  Rev. [place] \

## 2020-08-23 ENCOUNTER — Emergency Department (HOSPITAL_COMMUNITY): Payer: Medicare Other

## 2020-08-23 DIAGNOSIS — R262 Difficulty in walking, not elsewhere classified: Secondary | ICD-10-CM | POA: Diagnosis not present

## 2020-08-23 DIAGNOSIS — M25561 Pain in right knee: Secondary | ICD-10-CM | POA: Diagnosis not present

## 2020-08-23 DIAGNOSIS — R2681 Unsteadiness on feet: Secondary | ICD-10-CM | POA: Diagnosis not present

## 2020-08-23 DIAGNOSIS — M6281 Muscle weakness (generalized): Secondary | ICD-10-CM | POA: Diagnosis not present

## 2020-08-23 DIAGNOSIS — S0083XA Contusion of other part of head, initial encounter: Secondary | ICD-10-CM | POA: Diagnosis not present

## 2020-08-23 DIAGNOSIS — M79631 Pain in right forearm: Secondary | ICD-10-CM | POA: Diagnosis not present

## 2020-08-23 DIAGNOSIS — M7989 Other specified soft tissue disorders: Secondary | ICD-10-CM | POA: Diagnosis not present

## 2020-08-23 LAB — COMPREHENSIVE METABOLIC PANEL
ALT: 17 U/L (ref 0–44)
AST: 23 U/L (ref 15–41)
Albumin: 3.6 g/dL (ref 3.5–5.0)
Alkaline Phosphatase: 81 U/L (ref 38–126)
Anion gap: 11 (ref 5–15)
BUN: 30 mg/dL — ABNORMAL HIGH (ref 8–23)
CO2: 27 mmol/L (ref 22–32)
Calcium: 9.6 mg/dL (ref 8.9–10.3)
Chloride: 103 mmol/L (ref 98–111)
Creatinine, Ser: 1.51 mg/dL — ABNORMAL HIGH (ref 0.44–1.00)
GFR, Estimated: 32 mL/min — ABNORMAL LOW (ref >60)
Glucose, Bld: 122 mg/dL — ABNORMAL HIGH (ref 70–99)
Potassium: 4.1 mmol/L (ref 3.5–5.1)
Sodium: 141 mmol/L (ref 135–145)
Total Bilirubin: 0.5 mg/dL (ref 0.3–1.2)
Total Protein: 5.9 g/dL — ABNORMAL LOW (ref 6.5–8.1)

## 2020-08-23 LAB — CBC WITH DIFFERENTIAL/PLATELET
Abs Immature Granulocytes: 0.33 10*3/uL — ABNORMAL HIGH (ref 0.00–0.07)
Basophils Absolute: 0.1 10*3/uL (ref 0.0–0.1)
Basophils Relative: 1 %
Eosinophils Absolute: 0.1 10*3/uL (ref 0.0–0.5)
Eosinophils Relative: 2 %
HCT: 37.3 % (ref 36.0–46.0)
Hemoglobin: 11.7 g/dL — ABNORMAL LOW (ref 12.0–15.0)
Immature Granulocytes: 4 %
Lymphocytes Relative: 20 %
Lymphs Abs: 1.8 10*3/uL (ref 0.7–4.0)
MCH: 33.2 pg (ref 26.0–34.0)
MCHC: 31.4 g/dL (ref 30.0–36.0)
MCV: 106 fL — ABNORMAL HIGH (ref 80.0–100.0)
Monocytes Absolute: 0.8 10*3/uL (ref 0.1–1.0)
Monocytes Relative: 9 %
Neutro Abs: 6 10*3/uL (ref 1.7–7.7)
Neutrophils Relative %: 64 %
Platelets: 238 10*3/uL (ref 150–400)
RBC: 3.52 MIL/uL — ABNORMAL LOW (ref 3.87–5.11)
RDW: 13.2 % (ref 11.5–15.5)
WBC: 9.2 10*3/uL (ref 4.0–10.5)
nRBC: 0 % (ref 0.0–0.2)

## 2020-08-23 LAB — PROTIME-INR
INR: 0.9 (ref 0.8–1.2)
Prothrombin Time: 12.1 seconds (ref 11.4–15.2)

## 2020-08-23 MED ORDER — LIDOCAINE-EPINEPHRINE 1 %-1:100000 IJ SOLN
20.0000 mL | Freq: Once | INTRAMUSCULAR | Status: AC
  Administered 2020-08-23: 20 mL
  Filled 2020-08-23: qty 1

## 2020-08-23 NOTE — ED Notes (Signed)
Patient verbalizes understanding of discharge instructions. Opportunity for questioning and answers were provided. Armband removed by staff, pt discharged from ED via wheelchair.  

## 2020-08-23 NOTE — ED Notes (Signed)
Pt to xray

## 2020-08-27 DIAGNOSIS — R2681 Unsteadiness on feet: Secondary | ICD-10-CM | POA: Diagnosis not present

## 2020-08-27 DIAGNOSIS — M25561 Pain in right knee: Secondary | ICD-10-CM | POA: Diagnosis not present

## 2020-08-27 DIAGNOSIS — R262 Difficulty in walking, not elsewhere classified: Secondary | ICD-10-CM | POA: Diagnosis not present

## 2020-08-27 DIAGNOSIS — M6281 Muscle weakness (generalized): Secondary | ICD-10-CM | POA: Diagnosis not present

## 2020-08-29 DIAGNOSIS — R54 Age-related physical debility: Secondary | ICD-10-CM | POA: Diagnosis not present

## 2020-08-29 DIAGNOSIS — S0181XA Laceration without foreign body of other part of head, initial encounter: Secondary | ICD-10-CM | POA: Diagnosis not present

## 2020-08-29 DIAGNOSIS — S51811A Laceration without foreign body of right forearm, initial encounter: Secondary | ICD-10-CM | POA: Diagnosis not present

## 2020-08-29 DIAGNOSIS — W19XXXA Unspecified fall, initial encounter: Secondary | ICD-10-CM | POA: Diagnosis not present

## 2020-08-30 DIAGNOSIS — M25561 Pain in right knee: Secondary | ICD-10-CM | POA: Diagnosis not present

## 2020-08-30 DIAGNOSIS — R2681 Unsteadiness on feet: Secondary | ICD-10-CM | POA: Diagnosis not present

## 2020-08-30 DIAGNOSIS — R262 Difficulty in walking, not elsewhere classified: Secondary | ICD-10-CM | POA: Diagnosis not present

## 2020-08-30 DIAGNOSIS — M6281 Muscle weakness (generalized): Secondary | ICD-10-CM | POA: Diagnosis not present

## 2020-08-31 NOTE — Progress Notes (Signed)
Remote pacemaker transmission.   

## 2020-09-03 DIAGNOSIS — R262 Difficulty in walking, not elsewhere classified: Secondary | ICD-10-CM | POA: Diagnosis not present

## 2020-09-03 DIAGNOSIS — M6281 Muscle weakness (generalized): Secondary | ICD-10-CM | POA: Diagnosis not present

## 2020-09-03 DIAGNOSIS — R2681 Unsteadiness on feet: Secondary | ICD-10-CM | POA: Diagnosis not present

## 2020-09-03 DIAGNOSIS — M25561 Pain in right knee: Secondary | ICD-10-CM | POA: Diagnosis not present

## 2020-09-06 ENCOUNTER — Other Ambulatory Visit: Payer: Self-pay | Admitting: Family Medicine

## 2020-09-06 ENCOUNTER — Ambulatory Visit
Admission: RE | Admit: 2020-09-06 | Discharge: 2020-09-06 | Disposition: A | Discharge status: Home / Self Care | Payer: Medicare Other | Source: Ambulatory Visit | Attending: Family Medicine | Admitting: Family Medicine

## 2020-09-06 DIAGNOSIS — M25551 Pain in right hip: Secondary | ICD-10-CM | POA: Diagnosis not present

## 2020-09-06 DIAGNOSIS — S79911A Unspecified injury of right hip, initial encounter: Secondary | ICD-10-CM | POA: Diagnosis not present

## 2020-09-06 DIAGNOSIS — W19XXXA Unspecified fall, initial encounter: Secondary | ICD-10-CM

## 2020-09-06 DIAGNOSIS — Z96641 Presence of right artificial hip joint: Secondary | ICD-10-CM | POA: Diagnosis not present

## 2020-09-06 DIAGNOSIS — R52 Pain, unspecified: Secondary | ICD-10-CM

## 2020-09-06 DIAGNOSIS — Z471 Aftercare following joint replacement surgery: Secondary | ICD-10-CM | POA: Diagnosis not present

## 2020-09-11 DIAGNOSIS — M6281 Muscle weakness (generalized): Secondary | ICD-10-CM | POA: Diagnosis not present

## 2020-09-11 DIAGNOSIS — R2681 Unsteadiness on feet: Secondary | ICD-10-CM | POA: Diagnosis not present

## 2020-09-11 DIAGNOSIS — R262 Difficulty in walking, not elsewhere classified: Secondary | ICD-10-CM | POA: Diagnosis not present

## 2020-09-11 DIAGNOSIS — M25561 Pain in right knee: Secondary | ICD-10-CM | POA: Diagnosis not present

## 2020-09-12 DIAGNOSIS — M25561 Pain in right knee: Secondary | ICD-10-CM | POA: Diagnosis not present

## 2020-09-12 DIAGNOSIS — M6281 Muscle weakness (generalized): Secondary | ICD-10-CM | POA: Diagnosis not present

## 2020-09-12 DIAGNOSIS — R2681 Unsteadiness on feet: Secondary | ICD-10-CM | POA: Diagnosis not present

## 2020-09-12 DIAGNOSIS — R262 Difficulty in walking, not elsewhere classified: Secondary | ICD-10-CM | POA: Diagnosis not present

## 2020-09-17 DIAGNOSIS — M25561 Pain in right knee: Secondary | ICD-10-CM | POA: Diagnosis not present

## 2020-09-17 DIAGNOSIS — R262 Difficulty in walking, not elsewhere classified: Secondary | ICD-10-CM | POA: Diagnosis not present

## 2020-09-17 DIAGNOSIS — R2681 Unsteadiness on feet: Secondary | ICD-10-CM | POA: Diagnosis not present

## 2020-09-17 DIAGNOSIS — M6281 Muscle weakness (generalized): Secondary | ICD-10-CM | POA: Diagnosis not present

## 2020-09-19 DIAGNOSIS — R2681 Unsteadiness on feet: Secondary | ICD-10-CM | POA: Diagnosis not present

## 2020-09-19 DIAGNOSIS — M6281 Muscle weakness (generalized): Secondary | ICD-10-CM | POA: Diagnosis not present

## 2020-09-19 DIAGNOSIS — M25561 Pain in right knee: Secondary | ICD-10-CM | POA: Diagnosis not present

## 2020-09-19 DIAGNOSIS — R262 Difficulty in walking, not elsewhere classified: Secondary | ICD-10-CM | POA: Diagnosis not present

## 2020-09-24 DIAGNOSIS — R262 Difficulty in walking, not elsewhere classified: Secondary | ICD-10-CM | POA: Diagnosis not present

## 2020-09-24 DIAGNOSIS — R2681 Unsteadiness on feet: Secondary | ICD-10-CM | POA: Diagnosis not present

## 2020-09-24 DIAGNOSIS — M25561 Pain in right knee: Secondary | ICD-10-CM | POA: Diagnosis not present

## 2020-09-24 DIAGNOSIS — M6281 Muscle weakness (generalized): Secondary | ICD-10-CM | POA: Diagnosis not present

## 2020-09-26 DIAGNOSIS — R2681 Unsteadiness on feet: Secondary | ICD-10-CM | POA: Diagnosis not present

## 2020-09-26 DIAGNOSIS — M6281 Muscle weakness (generalized): Secondary | ICD-10-CM | POA: Diagnosis not present

## 2020-09-26 DIAGNOSIS — M25561 Pain in right knee: Secondary | ICD-10-CM | POA: Diagnosis not present

## 2020-09-26 DIAGNOSIS — R262 Difficulty in walking, not elsewhere classified: Secondary | ICD-10-CM | POA: Diagnosis not present

## 2020-10-01 DIAGNOSIS — R2681 Unsteadiness on feet: Secondary | ICD-10-CM | POA: Diagnosis not present

## 2020-10-01 DIAGNOSIS — Z1159 Encounter for screening for other viral diseases: Secondary | ICD-10-CM | POA: Diagnosis not present

## 2020-10-01 DIAGNOSIS — M6281 Muscle weakness (generalized): Secondary | ICD-10-CM | POA: Diagnosis not present

## 2020-10-01 DIAGNOSIS — M25561 Pain in right knee: Secondary | ICD-10-CM | POA: Diagnosis not present

## 2020-10-01 DIAGNOSIS — R262 Difficulty in walking, not elsewhere classified: Secondary | ICD-10-CM | POA: Diagnosis not present

## 2020-10-01 DIAGNOSIS — Z20828 Contact with and (suspected) exposure to other viral communicable diseases: Secondary | ICD-10-CM | POA: Diagnosis not present

## 2020-10-03 DIAGNOSIS — R262 Difficulty in walking, not elsewhere classified: Secondary | ICD-10-CM | POA: Diagnosis not present

## 2020-10-03 DIAGNOSIS — R2681 Unsteadiness on feet: Secondary | ICD-10-CM | POA: Diagnosis not present

## 2020-10-03 DIAGNOSIS — M6281 Muscle weakness (generalized): Secondary | ICD-10-CM | POA: Diagnosis not present

## 2020-10-03 DIAGNOSIS — M25561 Pain in right knee: Secondary | ICD-10-CM | POA: Diagnosis not present

## 2020-10-08 DIAGNOSIS — Z20828 Contact with and (suspected) exposure to other viral communicable diseases: Secondary | ICD-10-CM | POA: Diagnosis not present

## 2020-10-08 DIAGNOSIS — M25561 Pain in right knee: Secondary | ICD-10-CM | POA: Diagnosis not present

## 2020-10-08 DIAGNOSIS — R2681 Unsteadiness on feet: Secondary | ICD-10-CM | POA: Diagnosis not present

## 2020-10-08 DIAGNOSIS — M6281 Muscle weakness (generalized): Secondary | ICD-10-CM | POA: Diagnosis not present

## 2020-10-08 DIAGNOSIS — R262 Difficulty in walking, not elsewhere classified: Secondary | ICD-10-CM | POA: Diagnosis not present

## 2020-10-08 DIAGNOSIS — Z1159 Encounter for screening for other viral diseases: Secondary | ICD-10-CM | POA: Diagnosis not present

## 2020-10-10 DIAGNOSIS — M6281 Muscle weakness (generalized): Secondary | ICD-10-CM | POA: Diagnosis not present

## 2020-10-10 DIAGNOSIS — R262 Difficulty in walking, not elsewhere classified: Secondary | ICD-10-CM | POA: Diagnosis not present

## 2020-10-10 DIAGNOSIS — M25561 Pain in right knee: Secondary | ICD-10-CM | POA: Diagnosis not present

## 2020-10-10 DIAGNOSIS — R2681 Unsteadiness on feet: Secondary | ICD-10-CM | POA: Diagnosis not present

## 2020-10-15 DIAGNOSIS — R262 Difficulty in walking, not elsewhere classified: Secondary | ICD-10-CM | POA: Diagnosis not present

## 2020-10-15 DIAGNOSIS — Z1159 Encounter for screening for other viral diseases: Secondary | ICD-10-CM | POA: Diagnosis not present

## 2020-10-15 DIAGNOSIS — M6281 Muscle weakness (generalized): Secondary | ICD-10-CM | POA: Diagnosis not present

## 2020-10-15 DIAGNOSIS — Z20828 Contact with and (suspected) exposure to other viral communicable diseases: Secondary | ICD-10-CM | POA: Diagnosis not present

## 2020-10-15 DIAGNOSIS — M25561 Pain in right knee: Secondary | ICD-10-CM | POA: Diagnosis not present

## 2020-10-15 DIAGNOSIS — R2681 Unsteadiness on feet: Secondary | ICD-10-CM | POA: Diagnosis not present

## 2020-10-22 DIAGNOSIS — M25561 Pain in right knee: Secondary | ICD-10-CM | POA: Diagnosis not present

## 2020-10-22 DIAGNOSIS — Z20828 Contact with and (suspected) exposure to other viral communicable diseases: Secondary | ICD-10-CM | POA: Diagnosis not present

## 2020-10-22 DIAGNOSIS — R2681 Unsteadiness on feet: Secondary | ICD-10-CM | POA: Diagnosis not present

## 2020-10-22 DIAGNOSIS — M6281 Muscle weakness (generalized): Secondary | ICD-10-CM | POA: Diagnosis not present

## 2020-10-22 DIAGNOSIS — Z1159 Encounter for screening for other viral diseases: Secondary | ICD-10-CM | POA: Diagnosis not present

## 2020-10-22 DIAGNOSIS — R262 Difficulty in walking, not elsewhere classified: Secondary | ICD-10-CM | POA: Diagnosis not present

## 2020-10-29 DIAGNOSIS — Z20828 Contact with and (suspected) exposure to other viral communicable diseases: Secondary | ICD-10-CM | POA: Diagnosis not present

## 2020-10-29 DIAGNOSIS — Z1159 Encounter for screening for other viral diseases: Secondary | ICD-10-CM | POA: Diagnosis not present

## 2020-11-05 ENCOUNTER — Other Ambulatory Visit: Payer: Self-pay

## 2020-11-05 ENCOUNTER — Ambulatory Visit (INDEPENDENT_AMBULATORY_CARE_PROVIDER_SITE_OTHER): Payer: Medicare Other | Admitting: Cardiology

## 2020-11-05 ENCOUNTER — Encounter: Payer: Self-pay | Admitting: Cardiology

## 2020-11-05 VITALS — BP 128/60 | HR 85 | Ht 63.0 in | Wt 151.0 lb

## 2020-11-05 DIAGNOSIS — I5032 Chronic diastolic (congestive) heart failure: Secondary | ICD-10-CM

## 2020-11-05 DIAGNOSIS — R609 Edema, unspecified: Secondary | ICD-10-CM | POA: Diagnosis not present

## 2020-11-05 DIAGNOSIS — Z1159 Encounter for screening for other viral diseases: Secondary | ICD-10-CM | POA: Diagnosis not present

## 2020-11-05 DIAGNOSIS — E7800 Pure hypercholesterolemia, unspecified: Secondary | ICD-10-CM | POA: Diagnosis not present

## 2020-11-05 DIAGNOSIS — I420 Dilated cardiomyopathy: Secondary | ICD-10-CM | POA: Diagnosis not present

## 2020-11-05 DIAGNOSIS — I2510 Atherosclerotic heart disease of native coronary artery without angina pectoris: Secondary | ICD-10-CM | POA: Diagnosis not present

## 2020-11-05 DIAGNOSIS — I255 Ischemic cardiomyopathy: Secondary | ICD-10-CM

## 2020-11-05 DIAGNOSIS — I272 Pulmonary hypertension, unspecified: Secondary | ICD-10-CM | POA: Diagnosis not present

## 2020-11-05 DIAGNOSIS — I10 Essential (primary) hypertension: Secondary | ICD-10-CM

## 2020-11-05 DIAGNOSIS — Z20828 Contact with and (suspected) exposure to other viral communicable diseases: Secondary | ICD-10-CM | POA: Diagnosis not present

## 2020-11-05 MED ORDER — NITROGLYCERIN 0.4 MG SL SUBL
0.4000 mg | SUBLINGUAL_TABLET | SUBLINGUAL | 3 refills | Status: AC | PRN
Start: 2020-11-05 — End: ?

## 2020-11-05 NOTE — Patient Instructions (Signed)

## 2020-11-05 NOTE — Progress Notes (Addendum)
Cardiology Office Note:    Date:  11/05/2020   ID:  Lauro Franklin, DOB Jul 12, 1923, MRN [id number]  PCP:  Gaynelle Arabian, MD  Cardiologist:  Fransico Him, MD    Referring MD: Gaynelle Arabian, MD   Chief Complaint  Patient presents with  . Coronary Artery Disease  . Hypertension  . Hyperlipidemia  . Congestive Heart Failure    History of Present Illness:    [name] is a 87 y.o. female with a hx of ASCAD s/p DES to LAD with residual disease of the diag too small for PCI by cath 01/2010 and then repeat cath with restenosis of the LADs/p PCI 10/2010, chronic stable angina, ischemic DCM ( EF 45% on echo 08/2015), LBBB, BiVPPM, HTN, dyslipidemia and chronic systolic CHF.    She is here today for followup and is doing well.  She denies any chest pain or pressure, SOB, DOE, PND, orthopnea, LE edema, dizziness, palpitations or syncope. She is compliant with her meds and is tolerating meds with no SE.    Past Medical History:  Diagnosis Date  . Breast cancer ([place])    bilaterally  . CAD (coronary artery disease)    w/ DES to LAD with residual diagonal disease to small for PCI 6/11 and repeat DES to LAD for restenosis 3/12  . Cardiomyopathy ([place])    Mixed CM; s/p St. Jude CRT-P implant 10/24 w/ LV lead revision 10/25 . Echo 11-2017 EF 55%.    . Chronic diastolic (congestive) heart failure ([place])   . Dyslipidemia, goal LDL below 70   . GERD (gastroesophageal reflux disease)    occ  . Hematuria 02/23/2013   w/clots required hospital admission on 02/23/2013  . Hypertension   . LBBB (left bundle branch block)   . Macular degeneration   . Mild mitral regurgitation   . Osteoarthritis    "right knee" (02/23/2013)  . Osteopenia   . Pacemaker   . Pulmonary HTN (H[facility])    PASP 15mHg by echo 11/2017  . Umbilical hernia    Repaired on 01/10/2013   . Vitamin B12 deficiency     Past Surgical History:  Procedure Laterality Date  . BI-VENTRICULAR PACEMAKER INSERTION N/A 05/27/2012   Procedure:  BI-VENTRICULAR PACEMAKER INSERTION (CRT-P);  Surgeon: SDeboraha Sprang MD;  [facility];  Service: Cardiovascular;  Laterality: N/A;  . BREAST BIOPSY  1995   bilaterally  . CATARACT EXTRACTION    . CATARACT EXTRACTION W/ INTRAOCULAR LENS  IMPLANT, BILATERAL  1990's  . CORONARY ANGIOPLASTY WITH STENT PLACEMENT  2010   "1" (05/27/2012)  . HERNIA REPAIR    . HIP ARTHROPLASTY Left 10/08/2017   Procedure: LEFT HIP HIMIARTHROPLASTY;  Surgeon: DMelrose Nakayama MD;  [place] [facility]  Service: Orthopedics;  Laterality: Left;  . INSERT / REPLACE / REMOVE PACEMAKER  05/27/2012   initial placement - BiV PPM  . INSERT / REPLACE / REMOVE PACEMAKER  05/31/2012   "lead change/repaired" (05/31/2012)  . INSERTION OF MESH N/A 01/10/2013   Procedure: INSERTION OF MESH;  Surgeon: CHaywood Lasso MD;  [place] [facility]  Service: General;  Laterality: N/A;  . JOINT REPLACEMENT    . LEAD REVISION N/A 05/28/2012   Procedure: LEAD REVISION;  Surgeon: GEvans Lance MD;  [facility];  Service: Cardiovascular;  Laterality: N/A;  . LEAD REVISION Left 05/31/2012   Procedure: LEAD REVISION;  Surgeon: SDeboraha Sprang MD;  [facility];  Service: Cardiovascular;  Laterality: Left;  .  MASTECTOMY  1995   bilaterally  . TONSILLECTOMY    . TOTAL HIP ARTHROPLASTY  2011   right  . UMBILICAL HERNIA REPAIR N/A 01/10/2013   Procedure: HERNIA REPAIR UMBILICAL ADULT;  Surgeon: Haywood Lasso, MD;  Location: [place];  Service: General;  Laterality: N/A;    Current Medications: Current Meds  Medication Sig  . acetaminophen (TYLENOL) 500 MG tablet Take 500-1,000 mg by mouth every 6 (six) hours as needed.  Marland Kitchen aspirin EC 81 MG tablet Take 81 mg by mouth daily.  . Cholecalciferol (VITAMIN D-3) 5000 UNITS TABS Take 5,000 Units by mouth daily.   . clopidogrel (PLAVIX) 75 MG tablet TAKE ONE TABLET DAILY  . Cyanocobalamin (B-12) 1000 MCG/ML KIT Inject 1,000 mcg as directed every 30 (thirty) days.  .  furosemide (LASIX) 40 MG tablet TAKE ONE TABLET EACH DAY  . isosorbide mononitrate (IMDUR) 60 MG 24 hr tablet TAKE ONE AND ONE-HALF TABLETS DAILY  . Multiple Vitamin (MULTIVITAMIN WITH MINERALS) TABS tablet Take 1 tablet by mouth daily.  Marland Kitchen omeprazole (PRILOSEC) 20 MG capsule Take 20 mg by mouth daily.  . simvastatin (ZOCOR) 20 MG tablet TAKE ONE TABLET EACH DAY AT 6PM  . traMADol (ULTRAM) 50 MG tablet Take 50 mg by mouth as directed. Knee pain  . vitamin C (ASCORBIC ACID) 500 MG tablet Take 500 mg by mouth daily.  . [DISCONTINUED] nitroGLYCERIN (NITROSTAT) 0.4 MG SL tablet Place 1 tablet (0.4 mg total) every 5 (five) minutes as needed under the tongue for chest pain.     Allergies:   Celecoxib and Ramipril   Social History   Socioeconomic History  . Marital status: Widowed    Spouse name: Not on file  . Number of children: Not on file  . Years of education: Not on file  . Highest education level: Not on file  Occupational History  . Not on file  Tobacco Use  . Smoking status: Former Smoker    Packs/day: 1.00    Years: 20.00    Pack years: 20.00    Types: Cigarettes    Start date: 03/17/1943    Quit date: 01/05/1963    Years since quitting: 57.8  . Smokeless tobacco: Never Used  Substance and Sexual Activity  . Alcohol use: Yes    Alcohol/week: 5.0 standard drinks    Types: 5 Glasses of wine per week    Comment: 02/23/2013 "glass of wine ~ 5X/wk":  . Drug use: No  . Sexual activity: Not Currently  Other Topics Concern  . Not on file  Social History Narrative   Originally from [place], [place]. Previously lived in [place], [place]. Graduated college with a business degree (BS in [employer]). Currently enjoys reading. Previously enjoyed sewing and knitting. Previously also enjoyed water skiing. Previously worked as a Network engineer. Husband was a Personnel officer. Also previously worked for the [employer] for a year. 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  Doesn't have down pillows. No mold or asbestos exposure.    Social Determinants of Health   Financial Resource Strain: Not on file  Food Insecurity: Not on file  Transportation Needs: Not on file  Physical Activity: Not on file  Stress: Not on file  Social Connections: Not on file     Family History: The patient's family history includes Asthma in her grandchild; Breast cancer in her daughter, maternal aunt, maternal aunt, and mother; Heart disease in her father and mother.  ROS:   Please see the history of  present illness.    ROS  All other systems reviewed and negative.   EKGs/Labs/Other Studies Reviewed:    The following studies were reviewed today: EKG  EKG:  EKG is not  ordered today.   Recent Labs: 08/22/2020: ALT 17; BUN 30; Creatinine, Ser 1.51; Hemoglobin 11.7; Platelets 238; Potassium 4.1; Sodium 141   Recent Lipid Panel    Component Value Date/Time   CHOL 143 11/08/2019 1128   TRIG 81 11/08/2019 1128   HDL 75 11/08/2019 1128   CHOLHDL 1.9 11/08/2019 1128   CHOLHDL 1.9 11/27/2015 1042   VLDL 11 11/27/2015 1042   LDLCALC 53 11/08/2019 1128    Physical Exam:    VS:  BP 128/60   Pulse 85   Ht 5' 3"  (1.6 m)   Wt 151 lb (68.5 kg)   SpO2 92%   BMI 26.75 kg/m     Wt Readings from Last 3 Encounters:  11/05/20 151 lb (68.5 kg)  08/23/20 165 lb (74.8 kg)  11/15/19 153 lb (69.4 kg)     GEN: Well nourished, well developed in no acute distress HEENT: Normal NECK: No JVD; No carotid bruits LYMPHATICS: No lymphadenopathy CARDIAC:RRR, no murmurs, rubs, gallops RESPIRATORY:  Clear to auscultation without rales, wheezing or rhonchi  ABDOMEN: Soft, non-tender, non-distended MUSCULOSKELETAL:  2+ pitting pedal edema; No deformity  SKIN: Warm and dry NEUROLOGIC:  Alert and oriented x 3 PSYCHIATRIC:  Normal affect    ASSESSMENT:    1. Coronary artery disease involving native coronary artery of native heart without angina pectoris   2. Benign essential HTN    3. Dilated cardiomyopathy ([place])   4. Chronic diastolic CHF (congestive heart failure) ([place])   5. Pulmonary HTN ([place])   6. Pure hypercholesterolemia   7. Chronic edema    PLAN:    In order of problems listed above:  1.  ASCAD  - s/p DES to LAD with residual disease of the diag too small for PCI by cath 01/2010 and then repeat cath with restenosis of the LADs/p PCI 10/2010.   -she has chronic stable angina and ha only had 4-5 episodes of CP in the past year and all resolved after 1 SL NTG -continue ASA 71m daily, Plavix, statin and long acting nitrates  2.  Hypertension  -BP is well controlled -currently not on any antihypertensive meds  3.  Dilated cardiomyopathy  - LV function normalized after CRT-P with echo 12/01/2017 showing normal LV function with EF 55 to 60%.   -She is off ACE inhibitor and beta-blocker due to soft blood pressure and CKD -followed in device clinic  4.  Chronic diastolic heart failure  -lungs are clear  -weight has been stable and down 14lbs in the past 3 months -continue Lasix 421mdaily -SCr stable at 1.51  5.  Pulmonary hypertension  -echo 11/04/77/[place] systolic pressure was 40 mmHg.   -This is likely secondary to group 2 pulmonary hypertension secondary to pulmonary venous hypertension from LV diastolic heart failure.   -continue diuretic therapy.  6.  Hyperlipidemia  -LDL goal less than 70.   -continue Simvastatin 2064maily -check FLP and ALT  7.  Chronic LE edema -secondary to chronic venous insufficiency and sedentary state -I do now want to increase diuretics further due to CKD -encouraged her to keep legs elevated -continue Lasix 61m18mily    Medication Adjustments/Labs and Tests Ordered: Current medicines are reviewed at length with the patient today.  Concerns regarding medicines are outlined above.  No orders of the defined types were placed in this encounter.  Meds ordered this encounter  Medications  .  nitroGLYCERIN (NITROSTAT) 0.4 MG SL tablet    Sig: Place 1 tablet (0.4 mg total) under the tongue every 5 (five) minutes as needed for chest pain.    Dispense:  25 tablet    Refill:  3    Signed, Fransico Him, MD  11/05/2020 12:23 PM    [facility]

## 2020-11-12 DIAGNOSIS — Z1159 Encounter for screening for other viral diseases: Secondary | ICD-10-CM | POA: Diagnosis not present

## 2020-11-12 DIAGNOSIS — Z20828 Contact with and (suspected) exposure to other viral communicable diseases: Secondary | ICD-10-CM | POA: Diagnosis not present

## 2020-11-13 ENCOUNTER — Ambulatory Visit (INDEPENDENT_AMBULATORY_CARE_PROVIDER_SITE_OTHER): Payer: Medicare Other

## 2020-11-13 DIAGNOSIS — I420 Dilated cardiomyopathy: Secondary | ICD-10-CM

## 2020-11-13 DIAGNOSIS — I5032 Chronic diastolic (congestive) heart failure: Secondary | ICD-10-CM

## 2020-11-13 LAB — CUP PACEART REMOTE DEVICE CHECK
Battery Remaining Longevity: 19 mo
Battery Voltage: 2.89 V
Brady Statistic AP VP Percent: 0.29 %
Brady Statistic AP VS Percent: 0 %
Brady Statistic AS VP Percent: 99.53 %
Brady Statistic AS VS Percent: 0.18 %
Brady Statistic RA Percent Paced: 0.29 %
Brady Statistic RV Percent Paced: 93.07 %
Date Time Interrogation Session: 20220412184808
Implantable Lead Implant Date: 20131024
Implantable Lead Implant Date: 20131024
Implantable Lead Implant Date: 20131028
Implantable Lead Location: 753858
Implantable Lead Location: 753859
Implantable Lead Location: 753860
Implantable Lead Model: 1944
Implantable Lead Model: 1948
Implantable Lead Model: 4194
Implantable Pulse Generator Implant Date: 20131024
Lead Channel Impedance Value: 304 Ohm
Lead Channel Impedance Value: 418 Ohm
Lead Channel Impedance Value: 418 Ohm
Lead Channel Impedance Value: 456 Ohm
Lead Channel Impedance Value: 456 Ohm
Lead Channel Impedance Value: 475 Ohm
Lead Channel Impedance Value: 532 Ohm
Lead Channel Impedance Value: 608 Ohm
Lead Channel Impedance Value: 627 Ohm
Lead Channel Pacing Threshold Amplitude: 0.625 V
Lead Channel Pacing Threshold Amplitude: 0.875 V
Lead Channel Pacing Threshold Pulse Width: 0.4 ms
Lead Channel Pacing Threshold Pulse Width: 0.4 ms
Lead Channel Sensing Intrinsic Amplitude: 1.625 mV
Lead Channel Sensing Intrinsic Amplitude: 1.625 mV
Lead Channel Sensing Intrinsic Amplitude: 3.75 mV
Lead Channel Sensing Intrinsic Amplitude: 3.75 mV
Lead Channel Setting Pacing Amplitude: 2 V
Lead Channel Setting Pacing Amplitude: 2 V
Lead Channel Setting Pacing Amplitude: 2.5 V
Lead Channel Setting Pacing Pulse Width: 0.4 ms
Lead Channel Setting Pacing Pulse Width: 0.4 ms
Lead Channel Setting Sensing Sensitivity: 2.8 mV

## 2020-11-14 NOTE — Progress Notes (Signed)
Electrophysiology TeleHealth Note   Due to national recommendations of social distancing due to COVID 19, an audio/video telehealth visit is felt to be most appropriate for this patient at this time.  See MyChart message from today for the patient's consent to telehealth for [facility].   Date:  11/15/2020   ID:  [name], DOB [birth year], MRN [id number]  Location: patient's home  Provider location: [address] [place]  Evaluation Performed: Follow-up visit  PCP:  Gaynelle Arabian, MD  Cardiologist:   Electrophysiologist:  SK   Chief Complaint:  Pacemaker   History of Present Illness:    [name] is a 87 y.o. female who presents via audio/video conferencing for a telehealth visit today.  Since last being seen in our clinic for NICM/ICM cx by CHF and for which she received Medtronic CRT-P cx  the patient reports less energetic. She and her family thinks she is doing quite well.  Edema rx with diuretic.  Without a brisk urination response  Ambulates without significant difficulty.  No chest pain.   Date Cr K Hgb  12/17 1.11  10.6  3/19 1.42   8.8  4/21  1.19     1/22 1.51 4.1 11.7    DATE TEST EF    4/18 Echo   55-60  %   4/19 Echo  55-60 %       The patient denies symptoms of fevers, chills, cough, or new SOB worrisome for COVID 19.    Past Medical History:  Diagnosis Date  . Breast cancer ([place])    bilaterally  . CAD (coronary artery disease)    w/ DES to LAD with residual diagonal disease to small for PCI 6/11 and repeat DES to LAD for restenosis 3/12  . Cardiomyopathy ([place])    Mixed CM; s/p St. Jude CRT-P implant 10/24 w/ LV lead revision 10/25 . Echo 11-2017 EF 55%.    . Chronic diastolic (congestive) heart failure ([place])   . Dyslipidemia, goal LDL below 70   . GERD (gastroesophageal reflux disease)    occ  . Hematuria 02/23/2013   w/clots required hospital admission on 02/23/2013  . Hypertension   . LBBB (left bundle branch  block)   . Macular degeneration   . Mild mitral regurgitation   . Osteoarthritis    "right knee" (02/23/2013)  . Osteopenia   . Pacemaker   . Pulmonary HTN (H[facility])    PASP 79mHg by echo 11/2017  . Umbilical hernia    Repaired on 01/10/2013   . Vitamin B12 deficiency     Past Surgical History:  Procedure Laterality Date  . BI-VENTRICULAR PACEMAKER INSERTION N/A 05/27/2012   Procedure: BI-VENTRICULAR PACEMAKER INSERTION (CRT-P);  Surgeon: SDeboraha Sprang MD;  [facility];  Service: Cardiovascular;  Laterality: N/A;  . BREAST BIOPSY  1995   bilaterally  . CATARACT EXTRACTION    . CATARACT EXTRACTION W/ INTRAOCULAR LENS  IMPLANT, BILATERAL  1990's  . CORONARY ANGIOPLASTY WITH STENT PLACEMENT  2010   "1" (05/27/2012)  . HERNIA REPAIR    . HIP ARTHROPLASTY Left 10/08/2017   Procedure: LEFT HIP HIMIARTHROPLASTY;  Surgeon: DMelrose Nakayama MD;  [place]  Service: Orthopedics;  Laterality: Left;  . INSERT / REPLACE / REMOVE PACEMAKER  05/27/2012   initial placement - BiV PPM  . INSERT / REPLACE / REMOVE PACEMAKER  05/31/2012   "lead change/repaired" (05/31/2012)  . INSERTION OF MESH N/A 01/10/2013  Procedure: INSERTION OF MESH;  Surgeon: Haywood Lasso, MD;  Location: [place];  Service: General;  Laterality: N/A;  . JOINT REPLACEMENT    . LEAD REVISION N/A 05/28/2012   Procedure: LEAD REVISION;  Surgeon: Evans Lance, MD;  Location[facility];  Service: Cardiovascular;  Laterality: N/A;  . LEAD REVISION Left 05/31/2012   Procedure: LEAD REVISION;  Surgeon: Deboraha Sprang, MD;  [facility];  Service: Cardiovascular;  Laterality: Left;  Marland Kitchen MASTECTOMY  1995   bilaterally  . TONSILLECTOMY    . TOTAL HIP ARTHROPLASTY  2011   right  . UMBILICAL HERNIA REPAIR N/A 01/10/2013   Procedure: HERNIA REPAIR UMBILICAL ADULT;  Surgeon: Haywood Lasso, MD;  Location: [place];  Service: General;  Laterality: N/A;    Current Outpatient Medications  Medication Sig Dispense  Refill  . acetaminophen (TYLENOL) 500 MG tablet Take 500-1,000 mg by mouth every 6 (six) hours as needed.    Marland Kitchen aspirin EC 81 MG tablet Take 81 mg by mouth daily.    . Cholecalciferol (VITAMIN D-3) 5000 UNITS TABS Take 5,000 Units by mouth daily.     . clopidogrel (PLAVIX) 75 MG tablet TAKE ONE TABLET DAILY 90 tablet 3  . Cyanocobalamin (B-12) 1000 MCG/ML KIT Inject 1,000 mcg as directed every 30 (thirty) days.    . furosemide (LASIX) 40 MG tablet TAKE ONE TABLET EACH DAY 90 tablet 3  . isosorbide mononitrate (IMDUR) 60 MG 24 hr tablet TAKE ONE AND ONE-HALF TABLETS DAILY 135 tablet 3  . Multiple Vitamin (MULTIVITAMIN WITH MINERALS) TABS tablet Take 1 tablet by mouth daily.    . nitroGLYCERIN (NITROSTAT) 0.4 MG SL tablet Place 1 tablet (0.4 mg total) under the tongue every 5 (five) minutes as needed for chest pain. 25 tablet 3  . omeprazole (PRILOSEC) 20 MG capsule Take 20 mg by mouth daily.    . simvastatin (ZOCOR) 20 MG tablet TAKE ONE TABLET EACH DAY AT 6PM 90 tablet 3  . traMADol (ULTRAM) 50 MG tablet Take 50 mg by mouth as directed. Knee pain    . vitamin C (ASCORBIC ACID) 500 MG tablet Take 500 mg by mouth daily.     No current facility-administered medications for this visit.    Allergies:   Celecoxib and Ramipril   Social History:  The patient  reports that she quit smoking about 57 years ago. Her smoking use included cigarettes. She started smoking about 77 years ago. She has a 20.00 pack-year smoking history. She has never used smokeless tobacco. She reports current alcohol use of about 5.0 standard drinks of alcohol per week. She reports that she does not use drugs.   Family History:  The patient's   family history includes Asthma in her grandchild; Breast cancer in her daughter, maternal aunt, maternal aunt, and mother; Heart disease in her father and mother.   ROS:  Please see the history of present illness.   All other systems are personally reviewed and negative.    Exam:     Vital Signs:  Ht _0  (1.626 m)   Wt 150 lb (68 kg)   BMI 25.75 kg/m     Well appearing, alert and conversant, regular work of breathing,  good skin color Eyes- anicteric, neuro- grossly intact, skin- no apparent rash or lesions or cyanosis, mouth- oral mucosa is pink   Labs/Other Tests and Data Reviewed:    Recent Labs: 08/22/2020: ALT 17; BUN 30; Creatinine, Ser 1.51; Hemoglobin 11.7;  Platelets 238; Potassium 4.1; Sodium 141   Wt Readings from Last 3 Encounters:  11/15/20 150 lb (68 kg)  11/05/20 151 lb (68.5 kg)  08/23/20 165 lb (74.8 kg)     Other studies personally reviewed:    Last device remote is reviewed from [facility] PDF dated 4/22 which reveals normal device function,   arrhythmias - VTNS and SVT nonsustained  Battery approaching ERI  1.5 yrs  ASSESSMENT & PLAN:    Ischemic and nonischemic heart disease  Cardiomyopathy-resolved  Complete heart block-intermittent   Syncope  CRT P      .      Chronic edema  DOE    HFpEF   Some degree of volume overload.  However, not bothering her.  Not impacting her breathing.  Is quite satisfied and not inclined towards making any major medical changes.  No intercurrent syncope.  Without symptoms of ischemia   COVID 19 screen The patient denies symptoms of COVID 19 at this time.  The importance of social distancing was discussed today.  Follow-up:  48 m Next remote: As Scheduled   Current medicines are reviewed at length with the patient today.   The patient does not have concerns regarding her medicines.  The following changes were made today:  Take an extra Lasix as needed   Labs/ tests ordered today include:   No orders of the defined types were placed in this encounter.   Future tests ( post COVID )   months  Patient Risk:  after full review of this patients clinical status, I feel that they are at moderate risk at this time.  Today, I have spent 15 minutes with the patient with telehealth  technology discussing the above.  Signed, Virl Axe, MD  11/15/2020 1:59 PM     [place] [address] [phone] (office) [phone] (fax)

## 2020-11-15 ENCOUNTER — Telehealth (INDEPENDENT_AMBULATORY_CARE_PROVIDER_SITE_OTHER): Payer: Medicare Other | Admitting: Internal Medicine

## 2020-11-15 ENCOUNTER — Other Ambulatory Visit: Payer: Self-pay

## 2020-11-15 ENCOUNTER — Telehealth: Payer: Self-pay

## 2020-11-15 VITALS — Ht 64.0 in | Wt 150.0 lb

## 2020-11-15 DIAGNOSIS — I5022 Chronic systolic (congestive) heart failure: Secondary | ICD-10-CM

## 2020-11-15 DIAGNOSIS — I447 Left bundle-branch block, unspecified: Secondary | ICD-10-CM | POA: Diagnosis not present

## 2020-11-15 DIAGNOSIS — I255 Ischemic cardiomyopathy: Secondary | ICD-10-CM

## 2020-11-15 DIAGNOSIS — Z950 Presence of cardiac pacemaker: Secondary | ICD-10-CM | POA: Diagnosis not present

## 2020-11-15 NOTE — Patient Instructions (Signed)
Medication Instructions:  Your physician recommends that you continue on your current medications as directed. You may take an extra Lasix if needed for fluid overload.   Please refer to the Current Medication list given to you today.  *If you need a refill on your cardiac medications before your next appointment, please call your pharmacy*   Lab Work: None ordered.  If you have labs (blood work) drawn today and your tests are completely normal, you will receive your results only by: Marland Kitchen MyChart Message (if you have MyChart) OR . A paper copy in the mail If you have any lab test that is abnormal or we need to change your treatment, we will call you to review the results.   Testing/Procedures: None ordered.    Follow-Up: At [facility], you and your health needs are our priority.  As part of our continuing mission to provide you with exceptional heart care, we have created designated Provider Care Teams.  These Care Teams include your primary Cardiologist (physician) and Advanced Practice Providers (APPs -  Physician Assistants and Nurse Practitioners) who all work together to provide you with the care you need, when you need it.  We recommend signing up for the patient portal called "MyChart".  Sign up information is provided on this After Visit Summary.  MyChart is used to connect with patients for Virtual Visits (Telemedicine).  Patients are able to view lab/test results, encounter notes, upcoming appointments, etc.  Non-urgent messages can be sent to your provider as well.   To learn more about what you can do with MyChart, go to [email or web address].    Your next appointment:   12 months with Dr Caryl Comes

## 2020-11-15 NOTE — Telephone Encounter (Signed)
  Patient Consent for Virtual Visit         [name] has provided verbal consent on 11/15/2020 for a virtual visit (video or telephone).   CONSENT FOR VIRTUAL VISIT FOR:  [name]  By participating in this virtual visit I agree to the following:  I hereby voluntarily request, consent and authorize [facility] and its employed or contracted physicians, physician assistants, nurse practitioners or other licensed health care professionals (the Practitioner), to provide me with telemedicine health care services (the "Services") as deemed necessary by the treating Practitioner. I acknowledge and consent to receive the Services by the Practitioner via telemedicine. I understand that the telemedicine visit will involve communicating with the Practitioner through live audiovisual communication technology and the disclosure of certain medical information by electronic transmission. I acknowledge that I have been given the opportunity to request an in-person assessment or other available alternative prior to the telemedicine visit and am voluntarily participating in the telemedicine visit.  I understand that I have the right to withhold or withdraw my consent to the use of telemedicine in the course of my care at any time, without affecting my right to future care or treatment, and that the Practitioner or I may terminate the telemedicine visit at any time. I understand that I have the right to inspect all information obtained and/or recorded in the course of the telemedicine visit and may receive copies of available information for a reasonable fee.  I understand that some of the potential risks of receiving the Services via telemedicine include:  Marland Kitchen Delay or interruption in medical evaluation due to technological equipment failure or disruption; . Information transmitted may not be sufficient (e.g. poor resolution of images) to allow for appropriate medical decision making by the Practitioner;  and/or  . In rare instances, security protocols could fail, causing a breach of personal health information.  Furthermore, I acknowledge that it is my responsibility to provide information about my medical history, conditions and care that is complete and accurate to the best of my ability. I acknowledge that Practitioner's advice, recommendations, and/or decision may be based on factors not within their control, such as incomplete or inaccurate data provided by me or distortions of diagnostic images or specimens that may result from electronic transmissions. I understand that the practice of medicine is not an exact science and that Practitioner makes no warranties or guarantees regarding treatment outcomes. I acknowledge that a copy of this consent can be made available to me via my patient portal ([facility]), or I can request a printed copy by calling the office of [facility].    I understand that my insurance will be billed for this visit.   I have read or had this consent read to me. . I understand the contents of this consent, which adequately explains the benefits and risks of the Services being provided via telemedicine.  . I have been provided ample opportunity to ask questions regarding this consent and the Services and have had my questions answered to my satisfaction. . I give my informed consent for the services to be provided through the use of telemedicine in my medical care

## 2020-11-19 DIAGNOSIS — Z20828 Contact with and (suspected) exposure to other viral communicable diseases: Secondary | ICD-10-CM | POA: Diagnosis not present

## 2020-11-19 DIAGNOSIS — Z1159 Encounter for screening for other viral diseases: Secondary | ICD-10-CM | POA: Diagnosis not present

## 2020-11-28 NOTE — Progress Notes (Signed)
Remote pacemaker transmission.   

## 2020-12-03 DIAGNOSIS — Z1159 Encounter for screening for other viral diseases: Secondary | ICD-10-CM | POA: Diagnosis not present

## 2020-12-03 DIAGNOSIS — Z20828 Contact with and (suspected) exposure to other viral communicable diseases: Secondary | ICD-10-CM | POA: Diagnosis not present

## 2020-12-06 ENCOUNTER — Other Ambulatory Visit: Payer: Self-pay | Admitting: Cardiology

## 2020-12-06 ENCOUNTER — Other Ambulatory Visit: Payer: Self-pay | Admitting: Internal Medicine

## 2020-12-10 DIAGNOSIS — Z20828 Contact with and (suspected) exposure to other viral communicable diseases: Secondary | ICD-10-CM | POA: Diagnosis not present

## 2020-12-10 DIAGNOSIS — Z1159 Encounter for screening for other viral diseases: Secondary | ICD-10-CM | POA: Diagnosis not present

## 2020-12-17 DIAGNOSIS — Z20828 Contact with and (suspected) exposure to other viral communicable diseases: Secondary | ICD-10-CM | POA: Diagnosis not present

## 2020-12-17 DIAGNOSIS — Z1159 Encounter for screening for other viral diseases: Secondary | ICD-10-CM | POA: Diagnosis not present

## 2020-12-24 DIAGNOSIS — Z1159 Encounter for screening for other viral diseases: Secondary | ICD-10-CM | POA: Diagnosis not present

## 2020-12-24 DIAGNOSIS — Z20828 Contact with and (suspected) exposure to other viral communicable diseases: Secondary | ICD-10-CM | POA: Diagnosis not present

## 2020-12-31 DIAGNOSIS — Z20828 Contact with and (suspected) exposure to other viral communicable diseases: Secondary | ICD-10-CM | POA: Diagnosis not present

## 2020-12-31 DIAGNOSIS — Z1159 Encounter for screening for other viral diseases: Secondary | ICD-10-CM | POA: Diagnosis not present

## 2021-01-04 DIAGNOSIS — R278 Other lack of coordination: Secondary | ICD-10-CM | POA: Diagnosis not present

## 2021-01-04 DIAGNOSIS — R262 Difficulty in walking, not elsewhere classified: Secondary | ICD-10-CM | POA: Diagnosis not present

## 2021-01-04 DIAGNOSIS — M25561 Pain in right knee: Secondary | ICD-10-CM | POA: Diagnosis not present

## 2021-01-04 DIAGNOSIS — R2681 Unsteadiness on feet: Secondary | ICD-10-CM | POA: Diagnosis not present

## 2021-01-04 DIAGNOSIS — M6281 Muscle weakness (generalized): Secondary | ICD-10-CM | POA: Diagnosis not present

## 2021-01-04 DIAGNOSIS — H541223 Low vision right eye category 2, blindness left eye category 3: Secondary | ICD-10-CM | POA: Diagnosis not present

## 2021-01-07 DIAGNOSIS — Z20828 Contact with and (suspected) exposure to other viral communicable diseases: Secondary | ICD-10-CM | POA: Diagnosis not present

## 2021-01-07 DIAGNOSIS — Z1159 Encounter for screening for other viral diseases: Secondary | ICD-10-CM | POA: Diagnosis not present

## 2021-01-07 DIAGNOSIS — H541223 Low vision right eye category 2, blindness left eye category 3: Secondary | ICD-10-CM | POA: Diagnosis not present

## 2021-01-07 DIAGNOSIS — M6281 Muscle weakness (generalized): Secondary | ICD-10-CM | POA: Diagnosis not present

## 2021-01-07 DIAGNOSIS — R2681 Unsteadiness on feet: Secondary | ICD-10-CM | POA: Diagnosis not present

## 2021-01-07 DIAGNOSIS — R262 Difficulty in walking, not elsewhere classified: Secondary | ICD-10-CM | POA: Diagnosis not present

## 2021-01-07 DIAGNOSIS — M25561 Pain in right knee: Secondary | ICD-10-CM | POA: Diagnosis not present

## 2021-01-07 DIAGNOSIS — R278 Other lack of coordination: Secondary | ICD-10-CM | POA: Diagnosis not present

## 2021-01-08 DIAGNOSIS — M6281 Muscle weakness (generalized): Secondary | ICD-10-CM | POA: Diagnosis not present

## 2021-01-08 DIAGNOSIS — M25561 Pain in right knee: Secondary | ICD-10-CM | POA: Diagnosis not present

## 2021-01-08 DIAGNOSIS — R262 Difficulty in walking, not elsewhere classified: Secondary | ICD-10-CM | POA: Diagnosis not present

## 2021-01-08 DIAGNOSIS — H541223 Low vision right eye category 2, blindness left eye category 3: Secondary | ICD-10-CM | POA: Diagnosis not present

## 2021-01-08 DIAGNOSIS — R2681 Unsteadiness on feet: Secondary | ICD-10-CM | POA: Diagnosis not present

## 2021-01-08 DIAGNOSIS — R278 Other lack of coordination: Secondary | ICD-10-CM | POA: Diagnosis not present

## 2021-01-09 DIAGNOSIS — R2681 Unsteadiness on feet: Secondary | ICD-10-CM | POA: Diagnosis not present

## 2021-01-09 DIAGNOSIS — M6281 Muscle weakness (generalized): Secondary | ICD-10-CM | POA: Diagnosis not present

## 2021-01-09 DIAGNOSIS — M1990 Unspecified osteoarthritis, unspecified site: Secondary | ICD-10-CM | POA: Diagnosis not present

## 2021-01-09 DIAGNOSIS — H3530 Unspecified macular degeneration: Secondary | ICD-10-CM | POA: Diagnosis not present

## 2021-01-09 DIAGNOSIS — M25561 Pain in right knee: Secondary | ICD-10-CM | POA: Diagnosis not present

## 2021-01-09 DIAGNOSIS — R278 Other lack of coordination: Secondary | ICD-10-CM | POA: Diagnosis not present

## 2021-01-09 DIAGNOSIS — R54 Age-related physical debility: Secondary | ICD-10-CM | POA: Diagnosis not present

## 2021-01-09 DIAGNOSIS — R262 Difficulty in walking, not elsewhere classified: Secondary | ICD-10-CM | POA: Diagnosis not present

## 2021-01-09 DIAGNOSIS — H541223 Low vision right eye category 2, blindness left eye category 3: Secondary | ICD-10-CM | POA: Diagnosis not present

## 2021-01-10 DIAGNOSIS — R2681 Unsteadiness on feet: Secondary | ICD-10-CM | POA: Diagnosis not present

## 2021-01-10 DIAGNOSIS — Z1159 Encounter for screening for other viral diseases: Secondary | ICD-10-CM | POA: Diagnosis not present

## 2021-01-10 DIAGNOSIS — R278 Other lack of coordination: Secondary | ICD-10-CM | POA: Diagnosis not present

## 2021-01-10 DIAGNOSIS — H541223 Low vision right eye category 2, blindness left eye category 3: Secondary | ICD-10-CM | POA: Diagnosis not present

## 2021-01-10 DIAGNOSIS — R262 Difficulty in walking, not elsewhere classified: Secondary | ICD-10-CM | POA: Diagnosis not present

## 2021-01-10 DIAGNOSIS — M25561 Pain in right knee: Secondary | ICD-10-CM | POA: Diagnosis not present

## 2021-01-10 DIAGNOSIS — M6281 Muscle weakness (generalized): Secondary | ICD-10-CM | POA: Diagnosis not present

## 2021-01-10 DIAGNOSIS — Z20828 Contact with and (suspected) exposure to other viral communicable diseases: Secondary | ICD-10-CM | POA: Diagnosis not present

## 2021-01-11 DIAGNOSIS — M25561 Pain in right knee: Secondary | ICD-10-CM | POA: Diagnosis not present

## 2021-01-11 DIAGNOSIS — M6281 Muscle weakness (generalized): Secondary | ICD-10-CM | POA: Diagnosis not present

## 2021-01-11 DIAGNOSIS — R2681 Unsteadiness on feet: Secondary | ICD-10-CM | POA: Diagnosis not present

## 2021-01-11 DIAGNOSIS — H541223 Low vision right eye category 2, blindness left eye category 3: Secondary | ICD-10-CM | POA: Diagnosis not present

## 2021-01-11 DIAGNOSIS — R262 Difficulty in walking, not elsewhere classified: Secondary | ICD-10-CM | POA: Diagnosis not present

## 2021-01-11 DIAGNOSIS — R278 Other lack of coordination: Secondary | ICD-10-CM | POA: Diagnosis not present

## 2021-01-14 DIAGNOSIS — H541223 Low vision right eye category 2, blindness left eye category 3: Secondary | ICD-10-CM | POA: Diagnosis not present

## 2021-01-14 DIAGNOSIS — R2681 Unsteadiness on feet: Secondary | ICD-10-CM | POA: Diagnosis not present

## 2021-01-14 DIAGNOSIS — M25561 Pain in right knee: Secondary | ICD-10-CM | POA: Diagnosis not present

## 2021-01-14 DIAGNOSIS — R262 Difficulty in walking, not elsewhere classified: Secondary | ICD-10-CM | POA: Diagnosis not present

## 2021-01-14 DIAGNOSIS — M6281 Muscle weakness (generalized): Secondary | ICD-10-CM | POA: Diagnosis not present

## 2021-01-14 DIAGNOSIS — R278 Other lack of coordination: Secondary | ICD-10-CM | POA: Diagnosis not present

## 2021-01-15 DIAGNOSIS — R262 Difficulty in walking, not elsewhere classified: Secondary | ICD-10-CM | POA: Diagnosis not present

## 2021-01-15 DIAGNOSIS — H541223 Low vision right eye category 2, blindness left eye category 3: Secondary | ICD-10-CM | POA: Diagnosis not present

## 2021-01-15 DIAGNOSIS — R2681 Unsteadiness on feet: Secondary | ICD-10-CM | POA: Diagnosis not present

## 2021-01-15 DIAGNOSIS — M25561 Pain in right knee: Secondary | ICD-10-CM | POA: Diagnosis not present

## 2021-01-15 DIAGNOSIS — R278 Other lack of coordination: Secondary | ICD-10-CM | POA: Diagnosis not present

## 2021-01-15 DIAGNOSIS — M6281 Muscle weakness (generalized): Secondary | ICD-10-CM | POA: Diagnosis not present

## 2021-01-16 DIAGNOSIS — R2681 Unsteadiness on feet: Secondary | ICD-10-CM | POA: Diagnosis not present

## 2021-01-16 DIAGNOSIS — M6281 Muscle weakness (generalized): Secondary | ICD-10-CM | POA: Diagnosis not present

## 2021-01-16 DIAGNOSIS — R262 Difficulty in walking, not elsewhere classified: Secondary | ICD-10-CM | POA: Diagnosis not present

## 2021-01-16 DIAGNOSIS — R278 Other lack of coordination: Secondary | ICD-10-CM | POA: Diagnosis not present

## 2021-01-16 DIAGNOSIS — H541223 Low vision right eye category 2, blindness left eye category 3: Secondary | ICD-10-CM | POA: Diagnosis not present

## 2021-01-16 DIAGNOSIS — M25561 Pain in right knee: Secondary | ICD-10-CM | POA: Diagnosis not present

## 2021-01-17 DIAGNOSIS — Z20828 Contact with and (suspected) exposure to other viral communicable diseases: Secondary | ICD-10-CM | POA: Diagnosis not present

## 2021-01-17 DIAGNOSIS — R278 Other lack of coordination: Secondary | ICD-10-CM | POA: Diagnosis not present

## 2021-01-17 DIAGNOSIS — H541223 Low vision right eye category 2, blindness left eye category 3: Secondary | ICD-10-CM | POA: Diagnosis not present

## 2021-01-17 DIAGNOSIS — M6281 Muscle weakness (generalized): Secondary | ICD-10-CM | POA: Diagnosis not present

## 2021-01-17 DIAGNOSIS — M25561 Pain in right knee: Secondary | ICD-10-CM | POA: Diagnosis not present

## 2021-01-17 DIAGNOSIS — R262 Difficulty in walking, not elsewhere classified: Secondary | ICD-10-CM | POA: Diagnosis not present

## 2021-01-17 DIAGNOSIS — R2681 Unsteadiness on feet: Secondary | ICD-10-CM | POA: Diagnosis not present

## 2021-01-17 DIAGNOSIS — Z1159 Encounter for screening for other viral diseases: Secondary | ICD-10-CM | POA: Diagnosis not present

## 2021-01-21 DIAGNOSIS — I442 Atrioventricular block, complete: Secondary | ICD-10-CM | POA: Diagnosis not present

## 2021-01-21 DIAGNOSIS — I2510 Atherosclerotic heart disease of native coronary artery without angina pectoris: Secondary | ICD-10-CM | POA: Diagnosis not present

## 2021-01-21 DIAGNOSIS — Z7982 Long term (current) use of aspirin: Secondary | ICD-10-CM | POA: Diagnosis not present

## 2021-01-21 DIAGNOSIS — I130 Hypertensive heart and chronic kidney disease with heart failure and stage 1 through stage 4 chronic kidney disease, or unspecified chronic kidney disease: Secondary | ICD-10-CM | POA: Diagnosis not present

## 2021-01-21 DIAGNOSIS — Z853 Personal history of malignant neoplasm of breast: Secondary | ICD-10-CM | POA: Diagnosis not present

## 2021-01-21 DIAGNOSIS — I340 Nonrheumatic mitral (valve) insufficiency: Secondary | ICD-10-CM | POA: Diagnosis not present

## 2021-01-21 DIAGNOSIS — I5042 Chronic combined systolic (congestive) and diastolic (congestive) heart failure: Secondary | ICD-10-CM | POA: Diagnosis not present

## 2021-01-21 DIAGNOSIS — Z20828 Contact with and (suspected) exposure to other viral communicable diseases: Secondary | ICD-10-CM | POA: Diagnosis not present

## 2021-01-21 DIAGNOSIS — I429 Cardiomyopathy, unspecified: Secondary | ICD-10-CM | POA: Diagnosis not present

## 2021-01-21 DIAGNOSIS — Z9181 History of falling: Secondary | ICD-10-CM | POA: Diagnosis not present

## 2021-01-21 DIAGNOSIS — M1990 Unspecified osteoarthritis, unspecified site: Secondary | ICD-10-CM | POA: Diagnosis not present

## 2021-01-21 DIAGNOSIS — H3530 Unspecified macular degeneration: Secondary | ICD-10-CM | POA: Diagnosis not present

## 2021-01-21 DIAGNOSIS — F32A Depression, unspecified: Secondary | ICD-10-CM | POA: Diagnosis not present

## 2021-01-21 DIAGNOSIS — K219 Gastro-esophageal reflux disease without esophagitis: Secondary | ICD-10-CM | POA: Diagnosis not present

## 2021-01-21 DIAGNOSIS — Z7902 Long term (current) use of antithrombotics/antiplatelets: Secondary | ICD-10-CM | POA: Diagnosis not present

## 2021-01-21 DIAGNOSIS — E538 Deficiency of other specified B group vitamins: Secondary | ICD-10-CM | POA: Diagnosis not present

## 2021-01-21 DIAGNOSIS — Z79899 Other long term (current) drug therapy: Secondary | ICD-10-CM | POA: Diagnosis not present

## 2021-01-21 DIAGNOSIS — N183 Chronic kidney disease, stage 3 unspecified: Secondary | ICD-10-CM | POA: Diagnosis not present

## 2021-01-21 DIAGNOSIS — I447 Left bundle-branch block, unspecified: Secondary | ICD-10-CM | POA: Diagnosis not present

## 2021-01-21 DIAGNOSIS — Z87891 Personal history of nicotine dependence: Secondary | ICD-10-CM | POA: Diagnosis not present

## 2021-01-21 DIAGNOSIS — M1711 Unilateral primary osteoarthritis, right knee: Secondary | ICD-10-CM | POA: Diagnosis not present

## 2021-01-21 DIAGNOSIS — E785 Hyperlipidemia, unspecified: Secondary | ICD-10-CM | POA: Diagnosis not present

## 2021-01-21 DIAGNOSIS — I272 Pulmonary hypertension, unspecified: Secondary | ICD-10-CM | POA: Diagnosis not present

## 2021-01-21 DIAGNOSIS — Z1159 Encounter for screening for other viral diseases: Secondary | ICD-10-CM | POA: Diagnosis not present

## 2021-01-22 DIAGNOSIS — N183 Chronic kidney disease, stage 3 unspecified: Secondary | ICD-10-CM | POA: Diagnosis not present

## 2021-01-22 DIAGNOSIS — E538 Deficiency of other specified B group vitamins: Secondary | ICD-10-CM | POA: Diagnosis not present

## 2021-01-22 DIAGNOSIS — I2510 Atherosclerotic heart disease of native coronary artery without angina pectoris: Secondary | ICD-10-CM | POA: Diagnosis not present

## 2021-01-22 DIAGNOSIS — I272 Pulmonary hypertension, unspecified: Secondary | ICD-10-CM | POA: Diagnosis not present

## 2021-01-22 DIAGNOSIS — I5042 Chronic combined systolic (congestive) and diastolic (congestive) heart failure: Secondary | ICD-10-CM | POA: Diagnosis not present

## 2021-01-22 DIAGNOSIS — I130 Hypertensive heart and chronic kidney disease with heart failure and stage 1 through stage 4 chronic kidney disease, or unspecified chronic kidney disease: Secondary | ICD-10-CM | POA: Diagnosis not present

## 2021-01-24 DIAGNOSIS — Z1159 Encounter for screening for other viral diseases: Secondary | ICD-10-CM | POA: Diagnosis not present

## 2021-01-24 DIAGNOSIS — Z20828 Contact with and (suspected) exposure to other viral communicable diseases: Secondary | ICD-10-CM | POA: Diagnosis not present

## 2021-01-25 DIAGNOSIS — E538 Deficiency of other specified B group vitamins: Secondary | ICD-10-CM | POA: Diagnosis not present

## 2021-01-25 DIAGNOSIS — N183 Chronic kidney disease, stage 3 unspecified: Secondary | ICD-10-CM | POA: Diagnosis not present

## 2021-01-25 DIAGNOSIS — I2510 Atherosclerotic heart disease of native coronary artery without angina pectoris: Secondary | ICD-10-CM | POA: Diagnosis not present

## 2021-01-25 DIAGNOSIS — I5042 Chronic combined systolic (congestive) and diastolic (congestive) heart failure: Secondary | ICD-10-CM | POA: Diagnosis not present

## 2021-01-25 DIAGNOSIS — I272 Pulmonary hypertension, unspecified: Secondary | ICD-10-CM | POA: Diagnosis not present

## 2021-01-25 DIAGNOSIS — I130 Hypertensive heart and chronic kidney disease with heart failure and stage 1 through stage 4 chronic kidney disease, or unspecified chronic kidney disease: Secondary | ICD-10-CM | POA: Diagnosis not present

## 2021-01-28 DIAGNOSIS — I5042 Chronic combined systolic (congestive) and diastolic (congestive) heart failure: Secondary | ICD-10-CM | POA: Diagnosis not present

## 2021-01-28 DIAGNOSIS — Z20828 Contact with and (suspected) exposure to other viral communicable diseases: Secondary | ICD-10-CM | POA: Diagnosis not present

## 2021-01-28 DIAGNOSIS — N183 Chronic kidney disease, stage 3 unspecified: Secondary | ICD-10-CM | POA: Diagnosis not present

## 2021-01-28 DIAGNOSIS — I2510 Atherosclerotic heart disease of native coronary artery without angina pectoris: Secondary | ICD-10-CM | POA: Diagnosis not present

## 2021-01-28 DIAGNOSIS — E538 Deficiency of other specified B group vitamins: Secondary | ICD-10-CM | POA: Diagnosis not present

## 2021-01-28 DIAGNOSIS — I130 Hypertensive heart and chronic kidney disease with heart failure and stage 1 through stage 4 chronic kidney disease, or unspecified chronic kidney disease: Secondary | ICD-10-CM | POA: Diagnosis not present

## 2021-01-28 DIAGNOSIS — I272 Pulmonary hypertension, unspecified: Secondary | ICD-10-CM | POA: Diagnosis not present

## 2021-01-28 DIAGNOSIS — Z1159 Encounter for screening for other viral diseases: Secondary | ICD-10-CM | POA: Diagnosis not present

## 2021-01-31 DIAGNOSIS — N183 Chronic kidney disease, stage 3 unspecified: Secondary | ICD-10-CM | POA: Diagnosis not present

## 2021-01-31 DIAGNOSIS — I272 Pulmonary hypertension, unspecified: Secondary | ICD-10-CM | POA: Diagnosis not present

## 2021-01-31 DIAGNOSIS — I130 Hypertensive heart and chronic kidney disease with heart failure and stage 1 through stage 4 chronic kidney disease, or unspecified chronic kidney disease: Secondary | ICD-10-CM | POA: Diagnosis not present

## 2021-01-31 DIAGNOSIS — I5042 Chronic combined systolic (congestive) and diastolic (congestive) heart failure: Secondary | ICD-10-CM | POA: Diagnosis not present

## 2021-01-31 DIAGNOSIS — I2510 Atherosclerotic heart disease of native coronary artery without angina pectoris: Secondary | ICD-10-CM | POA: Diagnosis not present

## 2021-01-31 DIAGNOSIS — E538 Deficiency of other specified B group vitamins: Secondary | ICD-10-CM | POA: Diagnosis not present

## 2021-02-07 DIAGNOSIS — I2510 Atherosclerotic heart disease of native coronary artery without angina pectoris: Secondary | ICD-10-CM | POA: Diagnosis not present

## 2021-02-07 DIAGNOSIS — I272 Pulmonary hypertension, unspecified: Secondary | ICD-10-CM | POA: Diagnosis not present

## 2021-02-07 DIAGNOSIS — E538 Deficiency of other specified B group vitamins: Secondary | ICD-10-CM | POA: Diagnosis not present

## 2021-02-07 DIAGNOSIS — I130 Hypertensive heart and chronic kidney disease with heart failure and stage 1 through stage 4 chronic kidney disease, or unspecified chronic kidney disease: Secondary | ICD-10-CM | POA: Diagnosis not present

## 2021-02-07 DIAGNOSIS — N183 Chronic kidney disease, stage 3 unspecified: Secondary | ICD-10-CM | POA: Diagnosis not present

## 2021-02-07 DIAGNOSIS — I5042 Chronic combined systolic (congestive) and diastolic (congestive) heart failure: Secondary | ICD-10-CM | POA: Diagnosis not present

## 2021-02-08 DIAGNOSIS — N183 Chronic kidney disease, stage 3 unspecified: Secondary | ICD-10-CM | POA: Diagnosis not present

## 2021-02-08 DIAGNOSIS — I130 Hypertensive heart and chronic kidney disease with heart failure and stage 1 through stage 4 chronic kidney disease, or unspecified chronic kidney disease: Secondary | ICD-10-CM | POA: Diagnosis not present

## 2021-02-08 DIAGNOSIS — I2510 Atherosclerotic heart disease of native coronary artery without angina pectoris: Secondary | ICD-10-CM | POA: Diagnosis not present

## 2021-02-08 DIAGNOSIS — I5042 Chronic combined systolic (congestive) and diastolic (congestive) heart failure: Secondary | ICD-10-CM | POA: Diagnosis not present

## 2021-02-08 DIAGNOSIS — I272 Pulmonary hypertension, unspecified: Secondary | ICD-10-CM | POA: Diagnosis not present

## 2021-02-08 DIAGNOSIS — E538 Deficiency of other specified B group vitamins: Secondary | ICD-10-CM | POA: Diagnosis not present

## 2021-02-10 DIAGNOSIS — I272 Pulmonary hypertension, unspecified: Secondary | ICD-10-CM | POA: Diagnosis not present

## 2021-02-10 DIAGNOSIS — I2510 Atherosclerotic heart disease of native coronary artery without angina pectoris: Secondary | ICD-10-CM | POA: Diagnosis not present

## 2021-02-10 DIAGNOSIS — I130 Hypertensive heart and chronic kidney disease with heart failure and stage 1 through stage 4 chronic kidney disease, or unspecified chronic kidney disease: Secondary | ICD-10-CM | POA: Diagnosis not present

## 2021-02-10 DIAGNOSIS — E538 Deficiency of other specified B group vitamins: Secondary | ICD-10-CM | POA: Diagnosis not present

## 2021-02-10 DIAGNOSIS — N183 Chronic kidney disease, stage 3 unspecified: Secondary | ICD-10-CM | POA: Diagnosis not present

## 2021-02-10 DIAGNOSIS — I5042 Chronic combined systolic (congestive) and diastolic (congestive) heart failure: Secondary | ICD-10-CM | POA: Diagnosis not present

## 2021-02-12 ENCOUNTER — Ambulatory Visit (INDEPENDENT_AMBULATORY_CARE_PROVIDER_SITE_OTHER): Payer: Medicare Other

## 2021-02-12 DIAGNOSIS — I5022 Chronic systolic (congestive) heart failure: Secondary | ICD-10-CM

## 2021-02-12 DIAGNOSIS — I255 Ischemic cardiomyopathy: Secondary | ICD-10-CM | POA: Diagnosis not present

## 2021-02-12 LAB — CUP PACEART REMOTE DEVICE CHECK
Battery Remaining Longevity: 15 mo
Battery Voltage: 2.88 V
Brady Statistic AP VP Percent: 0.13 %
Brady Statistic AP VS Percent: 0 %
Brady Statistic AS VP Percent: 99.76 %
Brady Statistic AS VS Percent: 0.1 %
Brady Statistic RA Percent Paced: 0.13 %
Brady Statistic RV Percent Paced: 96.12 %
Date Time Interrogation Session: 20220712150627
Implantable Lead Implant Date: 20131024
Implantable Lead Implant Date: 20131024
Implantable Lead Implant Date: 20131028
Implantable Lead Location: 753858
Implantable Lead Location: 753859
Implantable Lead Location: 753860
Implantable Lead Model: 1944
Implantable Lead Model: 1948
Implantable Lead Model: 4194
Implantable Pulse Generator Implant Date: 20131024
Lead Channel Impedance Value: 304 Ohm
Lead Channel Impedance Value: 418 Ohm
Lead Channel Impedance Value: 418 Ohm
Lead Channel Impedance Value: 475 Ohm
Lead Channel Impedance Value: 475 Ohm
Lead Channel Impedance Value: 475 Ohm
Lead Channel Impedance Value: 551 Ohm
Lead Channel Impedance Value: 608 Ohm
Lead Channel Impedance Value: 646 Ohm
Lead Channel Pacing Threshold Amplitude: 0.625 V
Lead Channel Pacing Threshold Amplitude: 0.875 V
Lead Channel Pacing Threshold Pulse Width: 0.4 ms
Lead Channel Pacing Threshold Pulse Width: 0.4 ms
Lead Channel Sensing Intrinsic Amplitude: 1.75 mV
Lead Channel Sensing Intrinsic Amplitude: 1.75 mV
Lead Channel Sensing Intrinsic Amplitude: 4.125 mV
Lead Channel Sensing Intrinsic Amplitude: 4.125 mV
Lead Channel Setting Pacing Amplitude: 2 V
Lead Channel Setting Pacing Amplitude: 2 V
Lead Channel Setting Pacing Amplitude: 2.5 V
Lead Channel Setting Pacing Pulse Width: 0.4 ms
Lead Channel Setting Pacing Pulse Width: 0.4 ms
Lead Channel Setting Sensing Sensitivity: 2.8 mV

## 2021-02-13 DIAGNOSIS — I5042 Chronic combined systolic (congestive) and diastolic (congestive) heart failure: Secondary | ICD-10-CM | POA: Diagnosis not present

## 2021-02-13 DIAGNOSIS — I130 Hypertensive heart and chronic kidney disease with heart failure and stage 1 through stage 4 chronic kidney disease, or unspecified chronic kidney disease: Secondary | ICD-10-CM | POA: Diagnosis not present

## 2021-02-13 DIAGNOSIS — I272 Pulmonary hypertension, unspecified: Secondary | ICD-10-CM | POA: Diagnosis not present

## 2021-02-13 DIAGNOSIS — E538 Deficiency of other specified B group vitamins: Secondary | ICD-10-CM | POA: Diagnosis not present

## 2021-02-13 DIAGNOSIS — N183 Chronic kidney disease, stage 3 unspecified: Secondary | ICD-10-CM | POA: Diagnosis not present

## 2021-02-13 DIAGNOSIS — I2510 Atherosclerotic heart disease of native coronary artery without angina pectoris: Secondary | ICD-10-CM | POA: Diagnosis not present

## 2021-02-14 DIAGNOSIS — Z1159 Encounter for screening for other viral diseases: Secondary | ICD-10-CM | POA: Diagnosis not present

## 2021-02-14 DIAGNOSIS — Z20828 Contact with and (suspected) exposure to other viral communicable diseases: Secondary | ICD-10-CM | POA: Diagnosis not present

## 2021-02-15 DIAGNOSIS — N183 Chronic kidney disease, stage 3 unspecified: Secondary | ICD-10-CM | POA: Diagnosis not present

## 2021-02-15 DIAGNOSIS — I272 Pulmonary hypertension, unspecified: Secondary | ICD-10-CM | POA: Diagnosis not present

## 2021-02-15 DIAGNOSIS — I130 Hypertensive heart and chronic kidney disease with heart failure and stage 1 through stage 4 chronic kidney disease, or unspecified chronic kidney disease: Secondary | ICD-10-CM | POA: Diagnosis not present

## 2021-02-15 DIAGNOSIS — I5042 Chronic combined systolic (congestive) and diastolic (congestive) heart failure: Secondary | ICD-10-CM | POA: Diagnosis not present

## 2021-02-15 DIAGNOSIS — I2510 Atherosclerotic heart disease of native coronary artery without angina pectoris: Secondary | ICD-10-CM | POA: Diagnosis not present

## 2021-02-15 DIAGNOSIS — E538 Deficiency of other specified B group vitamins: Secondary | ICD-10-CM | POA: Diagnosis not present

## 2021-02-18 DIAGNOSIS — Z20828 Contact with and (suspected) exposure to other viral communicable diseases: Secondary | ICD-10-CM | POA: Diagnosis not present

## 2021-02-18 DIAGNOSIS — Z1159 Encounter for screening for other viral diseases: Secondary | ICD-10-CM | POA: Diagnosis not present

## 2021-02-20 DIAGNOSIS — H3530 Unspecified macular degeneration: Secondary | ICD-10-CM | POA: Diagnosis not present

## 2021-02-20 DIAGNOSIS — Z7982 Long term (current) use of aspirin: Secondary | ICD-10-CM | POA: Diagnosis not present

## 2021-02-20 DIAGNOSIS — E785 Hyperlipidemia, unspecified: Secondary | ICD-10-CM | POA: Diagnosis not present

## 2021-02-20 DIAGNOSIS — E538 Deficiency of other specified B group vitamins: Secondary | ICD-10-CM | POA: Diagnosis not present

## 2021-02-20 DIAGNOSIS — F32A Depression, unspecified: Secondary | ICD-10-CM | POA: Diagnosis not present

## 2021-02-20 DIAGNOSIS — I442 Atrioventricular block, complete: Secondary | ICD-10-CM | POA: Diagnosis not present

## 2021-02-20 DIAGNOSIS — I340 Nonrheumatic mitral (valve) insufficiency: Secondary | ICD-10-CM | POA: Diagnosis not present

## 2021-02-20 DIAGNOSIS — I5042 Chronic combined systolic (congestive) and diastolic (congestive) heart failure: Secondary | ICD-10-CM | POA: Diagnosis not present

## 2021-02-20 DIAGNOSIS — K219 Gastro-esophageal reflux disease without esophagitis: Secondary | ICD-10-CM | POA: Diagnosis not present

## 2021-02-20 DIAGNOSIS — I2510 Atherosclerotic heart disease of native coronary artery without angina pectoris: Secondary | ICD-10-CM | POA: Diagnosis not present

## 2021-02-20 DIAGNOSIS — N183 Chronic kidney disease, stage 3 unspecified: Secondary | ICD-10-CM | POA: Diagnosis not present

## 2021-02-20 DIAGNOSIS — Z9181 History of falling: Secondary | ICD-10-CM | POA: Diagnosis not present

## 2021-02-20 DIAGNOSIS — Z87891 Personal history of nicotine dependence: Secondary | ICD-10-CM | POA: Diagnosis not present

## 2021-02-20 DIAGNOSIS — M1711 Unilateral primary osteoarthritis, right knee: Secondary | ICD-10-CM | POA: Diagnosis not present

## 2021-02-20 DIAGNOSIS — I130 Hypertensive heart and chronic kidney disease with heart failure and stage 1 through stage 4 chronic kidney disease, or unspecified chronic kidney disease: Secondary | ICD-10-CM | POA: Diagnosis not present

## 2021-02-20 DIAGNOSIS — Z79899 Other long term (current) drug therapy: Secondary | ICD-10-CM | POA: Diagnosis not present

## 2021-02-20 DIAGNOSIS — Z7902 Long term (current) use of antithrombotics/antiplatelets: Secondary | ICD-10-CM | POA: Diagnosis not present

## 2021-02-20 DIAGNOSIS — I429 Cardiomyopathy, unspecified: Secondary | ICD-10-CM | POA: Diagnosis not present

## 2021-02-20 DIAGNOSIS — I447 Left bundle-branch block, unspecified: Secondary | ICD-10-CM | POA: Diagnosis not present

## 2021-02-20 DIAGNOSIS — Z853 Personal history of malignant neoplasm of breast: Secondary | ICD-10-CM | POA: Diagnosis not present

## 2021-02-20 DIAGNOSIS — I272 Pulmonary hypertension, unspecified: Secondary | ICD-10-CM | POA: Diagnosis not present

## 2021-02-20 DIAGNOSIS — M1990 Unspecified osteoarthritis, unspecified site: Secondary | ICD-10-CM | POA: Diagnosis not present

## 2021-02-21 DIAGNOSIS — Z1159 Encounter for screening for other viral diseases: Secondary | ICD-10-CM | POA: Diagnosis not present

## 2021-02-21 DIAGNOSIS — Z20828 Contact with and (suspected) exposure to other viral communicable diseases: Secondary | ICD-10-CM | POA: Diagnosis not present

## 2021-02-22 DIAGNOSIS — I2510 Atherosclerotic heart disease of native coronary artery without angina pectoris: Secondary | ICD-10-CM | POA: Diagnosis not present

## 2021-02-22 DIAGNOSIS — N183 Chronic kidney disease, stage 3 unspecified: Secondary | ICD-10-CM | POA: Diagnosis not present

## 2021-02-22 DIAGNOSIS — I130 Hypertensive heart and chronic kidney disease with heart failure and stage 1 through stage 4 chronic kidney disease, or unspecified chronic kidney disease: Secondary | ICD-10-CM | POA: Diagnosis not present

## 2021-02-22 DIAGNOSIS — I5042 Chronic combined systolic (congestive) and diastolic (congestive) heart failure: Secondary | ICD-10-CM | POA: Diagnosis not present

## 2021-02-22 DIAGNOSIS — E538 Deficiency of other specified B group vitamins: Secondary | ICD-10-CM | POA: Diagnosis not present

## 2021-02-22 DIAGNOSIS — I272 Pulmonary hypertension, unspecified: Secondary | ICD-10-CM | POA: Diagnosis not present

## 2021-02-25 DIAGNOSIS — Z1159 Encounter for screening for other viral diseases: Secondary | ICD-10-CM | POA: Diagnosis not present

## 2021-02-25 DIAGNOSIS — Z20828 Contact with and (suspected) exposure to other viral communicable diseases: Secondary | ICD-10-CM | POA: Diagnosis not present

## 2021-02-27 DIAGNOSIS — N183 Chronic kidney disease, stage 3 unspecified: Secondary | ICD-10-CM | POA: Diagnosis not present

## 2021-02-27 DIAGNOSIS — E538 Deficiency of other specified B group vitamins: Secondary | ICD-10-CM | POA: Diagnosis not present

## 2021-02-27 DIAGNOSIS — I130 Hypertensive heart and chronic kidney disease with heart failure and stage 1 through stage 4 chronic kidney disease, or unspecified chronic kidney disease: Secondary | ICD-10-CM | POA: Diagnosis not present

## 2021-02-27 DIAGNOSIS — I5042 Chronic combined systolic (congestive) and diastolic (congestive) heart failure: Secondary | ICD-10-CM | POA: Diagnosis not present

## 2021-02-27 DIAGNOSIS — I272 Pulmonary hypertension, unspecified: Secondary | ICD-10-CM | POA: Diagnosis not present

## 2021-02-27 DIAGNOSIS — I2510 Atherosclerotic heart disease of native coronary artery without angina pectoris: Secondary | ICD-10-CM | POA: Diagnosis not present

## 2021-02-28 DIAGNOSIS — Z1159 Encounter for screening for other viral diseases: Secondary | ICD-10-CM | POA: Diagnosis not present

## 2021-02-28 DIAGNOSIS — Z20828 Contact with and (suspected) exposure to other viral communicable diseases: Secondary | ICD-10-CM | POA: Diagnosis not present

## 2021-03-04 DIAGNOSIS — Z1159 Encounter for screening for other viral diseases: Secondary | ICD-10-CM | POA: Diagnosis not present

## 2021-03-04 DIAGNOSIS — Z20828 Contact with and (suspected) exposure to other viral communicable diseases: Secondary | ICD-10-CM | POA: Diagnosis not present

## 2021-03-05 DIAGNOSIS — N183 Chronic kidney disease, stage 3 unspecified: Secondary | ICD-10-CM | POA: Diagnosis not present

## 2021-03-05 DIAGNOSIS — I272 Pulmonary hypertension, unspecified: Secondary | ICD-10-CM | POA: Diagnosis not present

## 2021-03-05 DIAGNOSIS — I5042 Chronic combined systolic (congestive) and diastolic (congestive) heart failure: Secondary | ICD-10-CM | POA: Diagnosis not present

## 2021-03-05 DIAGNOSIS — E538 Deficiency of other specified B group vitamins: Secondary | ICD-10-CM | POA: Diagnosis not present

## 2021-03-05 DIAGNOSIS — I2510 Atherosclerotic heart disease of native coronary artery without angina pectoris: Secondary | ICD-10-CM | POA: Diagnosis not present

## 2021-03-05 DIAGNOSIS — I130 Hypertensive heart and chronic kidney disease with heart failure and stage 1 through stage 4 chronic kidney disease, or unspecified chronic kidney disease: Secondary | ICD-10-CM | POA: Diagnosis not present

## 2021-03-07 DIAGNOSIS — Z20828 Contact with and (suspected) exposure to other viral communicable diseases: Secondary | ICD-10-CM | POA: Diagnosis not present

## 2021-03-07 DIAGNOSIS — Z1159 Encounter for screening for other viral diseases: Secondary | ICD-10-CM | POA: Diagnosis not present

## 2021-03-07 NOTE — Progress Notes (Signed)
Remote pacemaker transmission.   

## 2021-03-13 DIAGNOSIS — Z8616 Personal history of COVID-19: Secondary | ICD-10-CM | POA: Diagnosis not present

## 2021-03-14 DIAGNOSIS — E538 Deficiency of other specified B group vitamins: Secondary | ICD-10-CM | POA: Diagnosis not present

## 2021-03-14 DIAGNOSIS — I130 Hypertensive heart and chronic kidney disease with heart failure and stage 1 through stage 4 chronic kidney disease, or unspecified chronic kidney disease: Secondary | ICD-10-CM | POA: Diagnosis not present

## 2021-03-14 DIAGNOSIS — I5042 Chronic combined systolic (congestive) and diastolic (congestive) heart failure: Secondary | ICD-10-CM | POA: Diagnosis not present

## 2021-03-14 DIAGNOSIS — I2510 Atherosclerotic heart disease of native coronary artery without angina pectoris: Secondary | ICD-10-CM | POA: Diagnosis not present

## 2021-03-14 DIAGNOSIS — N183 Chronic kidney disease, stage 3 unspecified: Secondary | ICD-10-CM | POA: Diagnosis not present

## 2021-03-14 DIAGNOSIS — I272 Pulmonary hypertension, unspecified: Secondary | ICD-10-CM | POA: Diagnosis not present

## 2021-03-19 DIAGNOSIS — N183 Chronic kidney disease, stage 3 unspecified: Secondary | ICD-10-CM | POA: Diagnosis not present

## 2021-03-19 DIAGNOSIS — I2510 Atherosclerotic heart disease of native coronary artery without angina pectoris: Secondary | ICD-10-CM | POA: Diagnosis not present

## 2021-03-19 DIAGNOSIS — I272 Pulmonary hypertension, unspecified: Secondary | ICD-10-CM | POA: Diagnosis not present

## 2021-03-19 DIAGNOSIS — I130 Hypertensive heart and chronic kidney disease with heart failure and stage 1 through stage 4 chronic kidney disease, or unspecified chronic kidney disease: Secondary | ICD-10-CM | POA: Diagnosis not present

## 2021-03-19 DIAGNOSIS — I5042 Chronic combined systolic (congestive) and diastolic (congestive) heart failure: Secondary | ICD-10-CM | POA: Diagnosis not present

## 2021-03-19 DIAGNOSIS — E538 Deficiency of other specified B group vitamins: Secondary | ICD-10-CM | POA: Diagnosis not present

## 2021-03-20 DIAGNOSIS — N183 Chronic kidney disease, stage 3 unspecified: Secondary | ICD-10-CM | POA: Diagnosis not present

## 2021-03-20 DIAGNOSIS — Z20828 Contact with and (suspected) exposure to other viral communicable diseases: Secondary | ICD-10-CM | POA: Diagnosis not present

## 2021-03-20 DIAGNOSIS — I2510 Atherosclerotic heart disease of native coronary artery without angina pectoris: Secondary | ICD-10-CM | POA: Diagnosis not present

## 2021-03-20 DIAGNOSIS — I5042 Chronic combined systolic (congestive) and diastolic (congestive) heart failure: Secondary | ICD-10-CM | POA: Diagnosis not present

## 2021-03-20 DIAGNOSIS — I130 Hypertensive heart and chronic kidney disease with heart failure and stage 1 through stage 4 chronic kidney disease, or unspecified chronic kidney disease: Secondary | ICD-10-CM | POA: Diagnosis not present

## 2021-03-20 DIAGNOSIS — I272 Pulmonary hypertension, unspecified: Secondary | ICD-10-CM | POA: Diagnosis not present

## 2021-03-20 DIAGNOSIS — E538 Deficiency of other specified B group vitamins: Secondary | ICD-10-CM | POA: Diagnosis not present

## 2021-03-22 DIAGNOSIS — I130 Hypertensive heart and chronic kidney disease with heart failure and stage 1 through stage 4 chronic kidney disease, or unspecified chronic kidney disease: Secondary | ICD-10-CM | POA: Diagnosis not present

## 2021-03-22 DIAGNOSIS — E785 Hyperlipidemia, unspecified: Secondary | ICD-10-CM | POA: Diagnosis not present

## 2021-03-22 DIAGNOSIS — I2510 Atherosclerotic heart disease of native coronary artery without angina pectoris: Secondary | ICD-10-CM | POA: Diagnosis not present

## 2021-03-22 DIAGNOSIS — I442 Atrioventricular block, complete: Secondary | ICD-10-CM | POA: Diagnosis not present

## 2021-03-22 DIAGNOSIS — I340 Nonrheumatic mitral (valve) insufficiency: Secondary | ICD-10-CM | POA: Diagnosis not present

## 2021-03-22 DIAGNOSIS — K219 Gastro-esophageal reflux disease without esophagitis: Secondary | ICD-10-CM | POA: Diagnosis not present

## 2021-03-22 DIAGNOSIS — I5042 Chronic combined systolic (congestive) and diastolic (congestive) heart failure: Secondary | ICD-10-CM | POA: Diagnosis not present

## 2021-03-22 DIAGNOSIS — E538 Deficiency of other specified B group vitamins: Secondary | ICD-10-CM | POA: Diagnosis not present

## 2021-03-22 DIAGNOSIS — I429 Cardiomyopathy, unspecified: Secondary | ICD-10-CM | POA: Diagnosis not present

## 2021-03-22 DIAGNOSIS — R32 Unspecified urinary incontinence: Secondary | ICD-10-CM | POA: Diagnosis not present

## 2021-03-22 DIAGNOSIS — Z9181 History of falling: Secondary | ICD-10-CM | POA: Diagnosis not present

## 2021-03-22 DIAGNOSIS — Z79899 Other long term (current) drug therapy: Secondary | ICD-10-CM | POA: Diagnosis not present

## 2021-03-22 DIAGNOSIS — I272 Pulmonary hypertension, unspecified: Secondary | ICD-10-CM | POA: Diagnosis not present

## 2021-03-22 DIAGNOSIS — Z853 Personal history of malignant neoplasm of breast: Secondary | ICD-10-CM | POA: Diagnosis not present

## 2021-03-22 DIAGNOSIS — M1711 Unilateral primary osteoarthritis, right knee: Secondary | ICD-10-CM | POA: Diagnosis not present

## 2021-03-22 DIAGNOSIS — Z87891 Personal history of nicotine dependence: Secondary | ICD-10-CM | POA: Diagnosis not present

## 2021-03-22 DIAGNOSIS — F32A Depression, unspecified: Secondary | ICD-10-CM | POA: Diagnosis not present

## 2021-03-22 DIAGNOSIS — Z7902 Long term (current) use of antithrombotics/antiplatelets: Secondary | ICD-10-CM | POA: Diagnosis not present

## 2021-03-22 DIAGNOSIS — H3530 Unspecified macular degeneration: Secondary | ICD-10-CM | POA: Diagnosis not present

## 2021-03-22 DIAGNOSIS — N183 Chronic kidney disease, stage 3 unspecified: Secondary | ICD-10-CM | POA: Diagnosis not present

## 2021-03-22 DIAGNOSIS — I447 Left bundle-branch block, unspecified: Secondary | ICD-10-CM | POA: Diagnosis not present

## 2021-03-22 DIAGNOSIS — Z7982 Long term (current) use of aspirin: Secondary | ICD-10-CM | POA: Diagnosis not present

## 2021-03-23 DIAGNOSIS — N183 Chronic kidney disease, stage 3 unspecified: Secondary | ICD-10-CM | POA: Diagnosis not present

## 2021-03-23 DIAGNOSIS — I130 Hypertensive heart and chronic kidney disease with heart failure and stage 1 through stage 4 chronic kidney disease, or unspecified chronic kidney disease: Secondary | ICD-10-CM | POA: Diagnosis not present

## 2021-03-23 DIAGNOSIS — I272 Pulmonary hypertension, unspecified: Secondary | ICD-10-CM | POA: Diagnosis not present

## 2021-03-23 DIAGNOSIS — E538 Deficiency of other specified B group vitamins: Secondary | ICD-10-CM | POA: Diagnosis not present

## 2021-03-23 DIAGNOSIS — I2510 Atherosclerotic heart disease of native coronary artery without angina pectoris: Secondary | ICD-10-CM | POA: Diagnosis not present

## 2021-03-23 DIAGNOSIS — I5042 Chronic combined systolic (congestive) and diastolic (congestive) heart failure: Secondary | ICD-10-CM | POA: Diagnosis not present

## 2021-03-27 DIAGNOSIS — Z20828 Contact with and (suspected) exposure to other viral communicable diseases: Secondary | ICD-10-CM | POA: Diagnosis not present

## 2021-03-27 DIAGNOSIS — N183 Chronic kidney disease, stage 3 unspecified: Secondary | ICD-10-CM | POA: Diagnosis not present

## 2021-03-27 DIAGNOSIS — I5042 Chronic combined systolic (congestive) and diastolic (congestive) heart failure: Secondary | ICD-10-CM | POA: Diagnosis not present

## 2021-03-27 DIAGNOSIS — I272 Pulmonary hypertension, unspecified: Secondary | ICD-10-CM | POA: Diagnosis not present

## 2021-03-27 DIAGNOSIS — I2510 Atherosclerotic heart disease of native coronary artery without angina pectoris: Secondary | ICD-10-CM | POA: Diagnosis not present

## 2021-03-27 DIAGNOSIS — E538 Deficiency of other specified B group vitamins: Secondary | ICD-10-CM | POA: Diagnosis not present

## 2021-03-27 DIAGNOSIS — I130 Hypertensive heart and chronic kidney disease with heart failure and stage 1 through stage 4 chronic kidney disease, or unspecified chronic kidney disease: Secondary | ICD-10-CM | POA: Diagnosis not present

## 2021-03-29 DIAGNOSIS — I5042 Chronic combined systolic (congestive) and diastolic (congestive) heart failure: Secondary | ICD-10-CM | POA: Diagnosis not present

## 2021-03-29 DIAGNOSIS — I272 Pulmonary hypertension, unspecified: Secondary | ICD-10-CM | POA: Diagnosis not present

## 2021-03-29 DIAGNOSIS — I130 Hypertensive heart and chronic kidney disease with heart failure and stage 1 through stage 4 chronic kidney disease, or unspecified chronic kidney disease: Secondary | ICD-10-CM | POA: Diagnosis not present

## 2021-03-29 DIAGNOSIS — N183 Chronic kidney disease, stage 3 unspecified: Secondary | ICD-10-CM | POA: Diagnosis not present

## 2021-03-29 DIAGNOSIS — I2510 Atherosclerotic heart disease of native coronary artery without angina pectoris: Secondary | ICD-10-CM | POA: Diagnosis not present

## 2021-03-29 DIAGNOSIS — E538 Deficiency of other specified B group vitamins: Secondary | ICD-10-CM | POA: Diagnosis not present

## 2021-04-03 DIAGNOSIS — Z20828 Contact with and (suspected) exposure to other viral communicable diseases: Secondary | ICD-10-CM | POA: Diagnosis not present

## 2021-04-03 DIAGNOSIS — E538 Deficiency of other specified B group vitamins: Secondary | ICD-10-CM | POA: Diagnosis not present

## 2021-04-03 DIAGNOSIS — I272 Pulmonary hypertension, unspecified: Secondary | ICD-10-CM | POA: Diagnosis not present

## 2021-04-03 DIAGNOSIS — I2510 Atherosclerotic heart disease of native coronary artery without angina pectoris: Secondary | ICD-10-CM | POA: Diagnosis not present

## 2021-04-03 DIAGNOSIS — I130 Hypertensive heart and chronic kidney disease with heart failure and stage 1 through stage 4 chronic kidney disease, or unspecified chronic kidney disease: Secondary | ICD-10-CM | POA: Diagnosis not present

## 2021-04-03 DIAGNOSIS — I5042 Chronic combined systolic (congestive) and diastolic (congestive) heart failure: Secondary | ICD-10-CM | POA: Diagnosis not present

## 2021-04-03 DIAGNOSIS — N183 Chronic kidney disease, stage 3 unspecified: Secondary | ICD-10-CM | POA: Diagnosis not present

## 2021-04-05 DIAGNOSIS — I130 Hypertensive heart and chronic kidney disease with heart failure and stage 1 through stage 4 chronic kidney disease, or unspecified chronic kidney disease: Secondary | ICD-10-CM | POA: Diagnosis not present

## 2021-04-05 DIAGNOSIS — I272 Pulmonary hypertension, unspecified: Secondary | ICD-10-CM | POA: Diagnosis not present

## 2021-04-05 DIAGNOSIS — E538 Deficiency of other specified B group vitamins: Secondary | ICD-10-CM | POA: Diagnosis not present

## 2021-04-05 DIAGNOSIS — I5042 Chronic combined systolic (congestive) and diastolic (congestive) heart failure: Secondary | ICD-10-CM | POA: Diagnosis not present

## 2021-04-05 DIAGNOSIS — N183 Chronic kidney disease, stage 3 unspecified: Secondary | ICD-10-CM | POA: Diagnosis not present

## 2021-04-05 DIAGNOSIS — I2510 Atherosclerotic heart disease of native coronary artery without angina pectoris: Secondary | ICD-10-CM | POA: Diagnosis not present

## 2021-04-10 DIAGNOSIS — I5042 Chronic combined systolic (congestive) and diastolic (congestive) heart failure: Secondary | ICD-10-CM | POA: Diagnosis not present

## 2021-04-10 DIAGNOSIS — N183 Chronic kidney disease, stage 3 unspecified: Secondary | ICD-10-CM | POA: Diagnosis not present

## 2021-04-10 DIAGNOSIS — Z20828 Contact with and (suspected) exposure to other viral communicable diseases: Secondary | ICD-10-CM | POA: Diagnosis not present

## 2021-04-10 DIAGNOSIS — I130 Hypertensive heart and chronic kidney disease with heart failure and stage 1 through stage 4 chronic kidney disease, or unspecified chronic kidney disease: Secondary | ICD-10-CM | POA: Diagnosis not present

## 2021-04-10 DIAGNOSIS — E538 Deficiency of other specified B group vitamins: Secondary | ICD-10-CM | POA: Diagnosis not present

## 2021-04-10 DIAGNOSIS — I272 Pulmonary hypertension, unspecified: Secondary | ICD-10-CM | POA: Diagnosis not present

## 2021-04-10 DIAGNOSIS — I2510 Atherosclerotic heart disease of native coronary artery without angina pectoris: Secondary | ICD-10-CM | POA: Diagnosis not present

## 2021-04-11 DIAGNOSIS — I272 Pulmonary hypertension, unspecified: Secondary | ICD-10-CM | POA: Diagnosis not present

## 2021-04-11 DIAGNOSIS — I5042 Chronic combined systolic (congestive) and diastolic (congestive) heart failure: Secondary | ICD-10-CM | POA: Diagnosis not present

## 2021-04-11 DIAGNOSIS — I130 Hypertensive heart and chronic kidney disease with heart failure and stage 1 through stage 4 chronic kidney disease, or unspecified chronic kidney disease: Secondary | ICD-10-CM | POA: Diagnosis not present

## 2021-04-11 DIAGNOSIS — E538 Deficiency of other specified B group vitamins: Secondary | ICD-10-CM | POA: Diagnosis not present

## 2021-04-11 DIAGNOSIS — I2510 Atherosclerotic heart disease of native coronary artery without angina pectoris: Secondary | ICD-10-CM | POA: Diagnosis not present

## 2021-04-11 DIAGNOSIS — N183 Chronic kidney disease, stage 3 unspecified: Secondary | ICD-10-CM | POA: Diagnosis not present

## 2021-04-16 DIAGNOSIS — N183 Chronic kidney disease, stage 3 unspecified: Secondary | ICD-10-CM | POA: Diagnosis not present

## 2021-04-16 DIAGNOSIS — I2510 Atherosclerotic heart disease of native coronary artery without angina pectoris: Secondary | ICD-10-CM | POA: Diagnosis not present

## 2021-04-16 DIAGNOSIS — I5042 Chronic combined systolic (congestive) and diastolic (congestive) heart failure: Secondary | ICD-10-CM | POA: Diagnosis not present

## 2021-04-16 DIAGNOSIS — E538 Deficiency of other specified B group vitamins: Secondary | ICD-10-CM | POA: Diagnosis not present

## 2021-04-16 DIAGNOSIS — I272 Pulmonary hypertension, unspecified: Secondary | ICD-10-CM | POA: Diagnosis not present

## 2021-04-16 DIAGNOSIS — I130 Hypertensive heart and chronic kidney disease with heart failure and stage 1 through stage 4 chronic kidney disease, or unspecified chronic kidney disease: Secondary | ICD-10-CM | POA: Diagnosis not present

## 2021-04-17 ENCOUNTER — Other Ambulatory Visit: Payer: Self-pay | Admitting: Family Medicine

## 2021-04-17 ENCOUNTER — Ambulatory Visit
Admission: RE | Admit: 2021-04-17 | Discharge: 2021-04-17 | Disposition: A | Discharge status: Home / Self Care | Payer: Medicare Other | Source: Ambulatory Visit | Attending: Family Medicine | Admitting: Family Medicine

## 2021-04-17 DIAGNOSIS — W19XXXA Unspecified fall, initial encounter: Secondary | ICD-10-CM | POA: Diagnosis not present

## 2021-04-17 DIAGNOSIS — M25551 Pain in right hip: Secondary | ICD-10-CM

## 2021-04-19 DIAGNOSIS — I2510 Atherosclerotic heart disease of native coronary artery without angina pectoris: Secondary | ICD-10-CM | POA: Diagnosis not present

## 2021-04-19 DIAGNOSIS — N183 Chronic kidney disease, stage 3 unspecified: Secondary | ICD-10-CM | POA: Diagnosis not present

## 2021-04-19 DIAGNOSIS — E538 Deficiency of other specified B group vitamins: Secondary | ICD-10-CM | POA: Diagnosis not present

## 2021-04-19 DIAGNOSIS — I130 Hypertensive heart and chronic kidney disease with heart failure and stage 1 through stage 4 chronic kidney disease, or unspecified chronic kidney disease: Secondary | ICD-10-CM | POA: Diagnosis not present

## 2021-04-19 DIAGNOSIS — I272 Pulmonary hypertension, unspecified: Secondary | ICD-10-CM | POA: Diagnosis not present

## 2021-04-19 DIAGNOSIS — I5042 Chronic combined systolic (congestive) and diastolic (congestive) heart failure: Secondary | ICD-10-CM | POA: Diagnosis not present

## 2021-04-21 DIAGNOSIS — I442 Atrioventricular block, complete: Secondary | ICD-10-CM | POA: Diagnosis not present

## 2021-04-21 DIAGNOSIS — Z7902 Long term (current) use of antithrombotics/antiplatelets: Secondary | ICD-10-CM | POA: Diagnosis not present

## 2021-04-21 DIAGNOSIS — I5042 Chronic combined systolic (congestive) and diastolic (congestive) heart failure: Secondary | ICD-10-CM | POA: Diagnosis not present

## 2021-04-21 DIAGNOSIS — H3530 Unspecified macular degeneration: Secondary | ICD-10-CM | POA: Diagnosis not present

## 2021-04-21 DIAGNOSIS — Z7982 Long term (current) use of aspirin: Secondary | ICD-10-CM | POA: Diagnosis not present

## 2021-04-21 DIAGNOSIS — Z79899 Other long term (current) drug therapy: Secondary | ICD-10-CM | POA: Diagnosis not present

## 2021-04-21 DIAGNOSIS — E785 Hyperlipidemia, unspecified: Secondary | ICD-10-CM | POA: Diagnosis not present

## 2021-04-21 DIAGNOSIS — I429 Cardiomyopathy, unspecified: Secondary | ICD-10-CM | POA: Diagnosis not present

## 2021-04-21 DIAGNOSIS — I2510 Atherosclerotic heart disease of native coronary artery without angina pectoris: Secondary | ICD-10-CM | POA: Diagnosis not present

## 2021-04-21 DIAGNOSIS — I130 Hypertensive heart and chronic kidney disease with heart failure and stage 1 through stage 4 chronic kidney disease, or unspecified chronic kidney disease: Secondary | ICD-10-CM | POA: Diagnosis not present

## 2021-04-21 DIAGNOSIS — Z9181 History of falling: Secondary | ICD-10-CM | POA: Diagnosis not present

## 2021-04-21 DIAGNOSIS — Z87891 Personal history of nicotine dependence: Secondary | ICD-10-CM | POA: Diagnosis not present

## 2021-04-21 DIAGNOSIS — I447 Left bundle-branch block, unspecified: Secondary | ICD-10-CM | POA: Diagnosis not present

## 2021-04-21 DIAGNOSIS — E538 Deficiency of other specified B group vitamins: Secondary | ICD-10-CM | POA: Diagnosis not present

## 2021-04-21 DIAGNOSIS — R32 Unspecified urinary incontinence: Secondary | ICD-10-CM | POA: Diagnosis not present

## 2021-04-21 DIAGNOSIS — K219 Gastro-esophageal reflux disease without esophagitis: Secondary | ICD-10-CM | POA: Diagnosis not present

## 2021-04-21 DIAGNOSIS — I340 Nonrheumatic mitral (valve) insufficiency: Secondary | ICD-10-CM | POA: Diagnosis not present

## 2021-04-21 DIAGNOSIS — N183 Chronic kidney disease, stage 3 unspecified: Secondary | ICD-10-CM | POA: Diagnosis not present

## 2021-04-21 DIAGNOSIS — Z853 Personal history of malignant neoplasm of breast: Secondary | ICD-10-CM | POA: Diagnosis not present

## 2021-04-21 DIAGNOSIS — I272 Pulmonary hypertension, unspecified: Secondary | ICD-10-CM | POA: Diagnosis not present

## 2021-04-21 DIAGNOSIS — F32A Depression, unspecified: Secondary | ICD-10-CM | POA: Diagnosis not present

## 2021-04-21 DIAGNOSIS — M1711 Unilateral primary osteoarthritis, right knee: Secondary | ICD-10-CM | POA: Diagnosis not present

## 2021-04-22 DIAGNOSIS — Z23 Encounter for immunization: Secondary | ICD-10-CM | POA: Diagnosis not present

## 2021-04-23 DIAGNOSIS — I130 Hypertensive heart and chronic kidney disease with heart failure and stage 1 through stage 4 chronic kidney disease, or unspecified chronic kidney disease: Secondary | ICD-10-CM | POA: Diagnosis not present

## 2021-04-23 DIAGNOSIS — I272 Pulmonary hypertension, unspecified: Secondary | ICD-10-CM | POA: Diagnosis not present

## 2021-04-23 DIAGNOSIS — I5042 Chronic combined systolic (congestive) and diastolic (congestive) heart failure: Secondary | ICD-10-CM | POA: Diagnosis not present

## 2021-04-23 DIAGNOSIS — I2510 Atherosclerotic heart disease of native coronary artery without angina pectoris: Secondary | ICD-10-CM | POA: Diagnosis not present

## 2021-04-23 DIAGNOSIS — N183 Chronic kidney disease, stage 3 unspecified: Secondary | ICD-10-CM | POA: Diagnosis not present

## 2021-04-23 DIAGNOSIS — E538 Deficiency of other specified B group vitamins: Secondary | ICD-10-CM | POA: Diagnosis not present

## 2021-04-24 DIAGNOSIS — E538 Deficiency of other specified B group vitamins: Secondary | ICD-10-CM | POA: Diagnosis not present

## 2021-04-24 DIAGNOSIS — N183 Chronic kidney disease, stage 3 unspecified: Secondary | ICD-10-CM | POA: Diagnosis not present

## 2021-04-24 DIAGNOSIS — I130 Hypertensive heart and chronic kidney disease with heart failure and stage 1 through stage 4 chronic kidney disease, or unspecified chronic kidney disease: Secondary | ICD-10-CM | POA: Diagnosis not present

## 2021-04-24 DIAGNOSIS — I5042 Chronic combined systolic (congestive) and diastolic (congestive) heart failure: Secondary | ICD-10-CM | POA: Diagnosis not present

## 2021-04-24 DIAGNOSIS — I272 Pulmonary hypertension, unspecified: Secondary | ICD-10-CM | POA: Diagnosis not present

## 2021-04-24 DIAGNOSIS — I2510 Atherosclerotic heart disease of native coronary artery without angina pectoris: Secondary | ICD-10-CM | POA: Diagnosis not present

## 2021-04-29 DIAGNOSIS — I130 Hypertensive heart and chronic kidney disease with heart failure and stage 1 through stage 4 chronic kidney disease, or unspecified chronic kidney disease: Secondary | ICD-10-CM | POA: Diagnosis not present

## 2021-04-29 DIAGNOSIS — E538 Deficiency of other specified B group vitamins: Secondary | ICD-10-CM | POA: Diagnosis not present

## 2021-04-29 DIAGNOSIS — I272 Pulmonary hypertension, unspecified: Secondary | ICD-10-CM | POA: Diagnosis not present

## 2021-04-29 DIAGNOSIS — N183 Chronic kidney disease, stage 3 unspecified: Secondary | ICD-10-CM | POA: Diagnosis not present

## 2021-04-29 DIAGNOSIS — I2510 Atherosclerotic heart disease of native coronary artery without angina pectoris: Secondary | ICD-10-CM | POA: Diagnosis not present

## 2021-04-29 DIAGNOSIS — I5042 Chronic combined systolic (congestive) and diastolic (congestive) heart failure: Secondary | ICD-10-CM | POA: Diagnosis not present

## 2021-05-01 DIAGNOSIS — Z20828 Contact with and (suspected) exposure to other viral communicable diseases: Secondary | ICD-10-CM | POA: Diagnosis not present

## 2021-05-06 DIAGNOSIS — N183 Chronic kidney disease, stage 3 unspecified: Secondary | ICD-10-CM | POA: Diagnosis not present

## 2021-05-06 DIAGNOSIS — I2510 Atherosclerotic heart disease of native coronary artery without angina pectoris: Secondary | ICD-10-CM | POA: Diagnosis not present

## 2021-05-06 DIAGNOSIS — E538 Deficiency of other specified B group vitamins: Secondary | ICD-10-CM | POA: Diagnosis not present

## 2021-05-06 DIAGNOSIS — I5042 Chronic combined systolic (congestive) and diastolic (congestive) heart failure: Secondary | ICD-10-CM | POA: Diagnosis not present

## 2021-05-06 DIAGNOSIS — I130 Hypertensive heart and chronic kidney disease with heart failure and stage 1 through stage 4 chronic kidney disease, or unspecified chronic kidney disease: Secondary | ICD-10-CM | POA: Diagnosis not present

## 2021-05-06 DIAGNOSIS — I272 Pulmonary hypertension, unspecified: Secondary | ICD-10-CM | POA: Diagnosis not present

## 2021-05-08 DIAGNOSIS — Z8616 Personal history of COVID-19: Secondary | ICD-10-CM | POA: Diagnosis not present

## 2021-05-09 ENCOUNTER — Other Ambulatory Visit: Payer: Self-pay

## 2021-05-09 ENCOUNTER — Emergency Department (HOSPITAL_COMMUNITY): Payer: Medicare Other

## 2021-05-09 ENCOUNTER — Encounter (HOSPITAL_COMMUNITY): Payer: Self-pay

## 2021-05-09 ENCOUNTER — Emergency Department (HOSPITAL_COMMUNITY)
Admission: EM | Admit: 2021-05-09 | Discharge: 2021-05-10 | Disposition: A | Discharge status: Home / Self Care | Payer: Medicare Other | Attending: Student | Admitting: Student

## 2021-05-09 DIAGNOSIS — W19XXXA Unspecified fall, initial encounter: Secondary | ICD-10-CM | POA: Diagnosis not present

## 2021-05-09 DIAGNOSIS — I5032 Chronic diastolic (congestive) heart failure: Secondary | ICD-10-CM | POA: Insufficient documentation

## 2021-05-09 DIAGNOSIS — I130 Hypertensive heart and chronic kidney disease with heart failure and stage 1 through stage 4 chronic kidney disease, or unspecified chronic kidney disease: Secondary | ICD-10-CM | POA: Diagnosis not present

## 2021-05-09 DIAGNOSIS — Z950 Presence of cardiac pacemaker: Secondary | ICD-10-CM | POA: Diagnosis not present

## 2021-05-09 DIAGNOSIS — Z96643 Presence of artificial hip joint, bilateral: Secondary | ICD-10-CM | POA: Insufficient documentation

## 2021-05-09 DIAGNOSIS — S32040A Wedge compression fracture of fourth lumbar vertebra, initial encounter for closed fracture: Secondary | ICD-10-CM | POA: Insufficient documentation

## 2021-05-09 DIAGNOSIS — R0602 Shortness of breath: Secondary | ICD-10-CM | POA: Diagnosis not present

## 2021-05-09 DIAGNOSIS — G319 Degenerative disease of nervous system, unspecified: Secondary | ICD-10-CM | POA: Diagnosis not present

## 2021-05-09 DIAGNOSIS — W010XXA Fall on same level from slipping, tripping and stumbling without subsequent striking against object, initial encounter: Secondary | ICD-10-CM | POA: Insufficient documentation

## 2021-05-09 DIAGNOSIS — Y92002 Bathroom of unspecified non-institutional (private) residence single-family (private) house as the place of occurrence of the external cause: Secondary | ICD-10-CM | POA: Diagnosis not present

## 2021-05-09 DIAGNOSIS — Z853 Personal history of malignant neoplasm of breast: Secondary | ICD-10-CM | POA: Diagnosis not present

## 2021-05-09 DIAGNOSIS — N261 Atrophy of kidney (terminal): Secondary | ICD-10-CM | POA: Diagnosis not present

## 2021-05-09 DIAGNOSIS — Z87891 Personal history of nicotine dependence: Secondary | ICD-10-CM | POA: Diagnosis not present

## 2021-05-09 DIAGNOSIS — N200 Calculus of kidney: Secondary | ICD-10-CM | POA: Diagnosis not present

## 2021-05-09 DIAGNOSIS — I2510 Atherosclerotic heart disease of native coronary artery without angina pectoris: Secondary | ICD-10-CM | POA: Insufficient documentation

## 2021-05-09 DIAGNOSIS — Z7982 Long term (current) use of aspirin: Secondary | ICD-10-CM | POA: Insufficient documentation

## 2021-05-09 DIAGNOSIS — Z9013 Acquired absence of bilateral breasts and nipples: Secondary | ICD-10-CM | POA: Insufficient documentation

## 2021-05-09 DIAGNOSIS — M549 Dorsalgia, unspecified: Secondary | ICD-10-CM | POA: Diagnosis not present

## 2021-05-09 DIAGNOSIS — Z79899 Other long term (current) drug therapy: Secondary | ICD-10-CM | POA: Insufficient documentation

## 2021-05-09 DIAGNOSIS — N183 Chronic kidney disease, stage 3 unspecified: Secondary | ICD-10-CM | POA: Insufficient documentation

## 2021-05-09 DIAGNOSIS — I517 Cardiomegaly: Secondary | ICD-10-CM | POA: Diagnosis not present

## 2021-05-09 DIAGNOSIS — S3992XA Unspecified injury of lower back, initial encounter: Secondary | ICD-10-CM | POA: Diagnosis present

## 2021-05-09 DIAGNOSIS — I10 Essential (primary) hypertension: Secondary | ICD-10-CM | POA: Diagnosis not present

## 2021-05-09 DIAGNOSIS — S0990XA Unspecified injury of head, initial encounter: Secondary | ICD-10-CM | POA: Diagnosis present

## 2021-05-09 DIAGNOSIS — Z043 Encounter for examination and observation following other accident: Secondary | ICD-10-CM | POA: Diagnosis not present

## 2021-05-09 DIAGNOSIS — M160 Bilateral primary osteoarthritis of hip: Secondary | ICD-10-CM | POA: Diagnosis not present

## 2021-05-09 DIAGNOSIS — S32030A Wedge compression fracture of third lumbar vertebra, initial encounter for closed fracture: Secondary | ICD-10-CM | POA: Diagnosis not present

## 2021-05-09 LAB — CBC WITH DIFFERENTIAL/PLATELET
Abs Immature Granulocytes: 0.55 10*3/uL — ABNORMAL HIGH (ref 0.00–0.07)
Basophils Absolute: 0 10*3/uL (ref 0.0–0.1)
Basophils Relative: 0 %
Eosinophils Absolute: 0.1 10*3/uL (ref 0.0–0.5)
Eosinophils Relative: 1 %
HCT: 34.6 % — ABNORMAL LOW (ref 36.0–46.0)
Hemoglobin: 11 g/dL — ABNORMAL LOW (ref 12.0–15.0)
Immature Granulocytes: 6 %
Lymphocytes Relative: 14 %
Lymphs Abs: 1.4 10*3/uL (ref 0.7–4.0)
MCH: 33.1 pg (ref 26.0–34.0)
MCHC: 31.8 g/dL (ref 30.0–36.0)
MCV: 104.2 fL — ABNORMAL HIGH (ref 80.0–100.0)
Monocytes Absolute: 0.8 10*3/uL (ref 0.1–1.0)
Monocytes Relative: 9 %
Neutro Abs: 6.9 10*3/uL (ref 1.7–7.7)
Neutrophils Relative %: 70 %
Platelets: 270 10*3/uL (ref 150–400)
RBC: 3.32 MIL/uL — ABNORMAL LOW (ref 3.87–5.11)
RDW: 13.6 % (ref 11.5–15.5)
WBC: 9.8 10*3/uL (ref 4.0–10.5)
nRBC: 0 % (ref 0.0–0.2)

## 2021-05-09 LAB — COMPREHENSIVE METABOLIC PANEL
ALT: 11 U/L (ref 0–44)
AST: 17 U/L (ref 15–41)
Albumin: 2.9 g/dL — ABNORMAL LOW (ref 3.5–5.0)
Alkaline Phosphatase: 161 U/L — ABNORMAL HIGH (ref 38–126)
Anion gap: 8 (ref 5–15)
BUN: 29 mg/dL — ABNORMAL HIGH (ref 8–23)
CO2: 28 mmol/L (ref 22–32)
Calcium: 8.8 mg/dL — ABNORMAL LOW (ref 8.9–10.3)
Chloride: 103 mmol/L (ref 98–111)
Creatinine, Ser: 1.41 mg/dL — ABNORMAL HIGH (ref 0.44–1.00)
GFR, Estimated: 34 mL/min — ABNORMAL LOW (ref >60)
Glucose, Bld: 104 mg/dL — ABNORMAL HIGH (ref 70–99)
Potassium: 4 mmol/L (ref 3.5–5.1)
Sodium: 139 mmol/L (ref 135–145)
Total Bilirubin: 0.9 mg/dL (ref 0.3–1.2)
Total Protein: 5.9 g/dL — ABNORMAL LOW (ref 6.5–8.1)

## 2021-05-09 LAB — BRAIN NATRIURETIC PEPTIDE: B Natriuretic Peptide: 243.6 pg/mL — ABNORMAL HIGH (ref 0.0–100.0)

## 2021-05-09 LAB — TROPONIN I (HIGH SENSITIVITY): Troponin I (High Sensitivity): 12 ng/L (ref <18)

## 2021-05-09 NOTE — ED Triage Notes (Signed)
Patient brought in via ems from assisted living. Patient was walking with walker to restroom when she tripped and fell. Denies LOC. States she was on the floor for about an hour before moving her. C/o low back pain

## 2021-05-09 NOTE — ED Provider Notes (Signed)
[facility] EMERGENCY DEPARTMENT Provider Note   CSN: [id number] Arrival date & time: 05/09/21  1820     History Chief Complaint  Patient presents with   Lytle Michaels    [name] is a 87 y.o. female who presents to the emergency department for evaluation of a fall and low back pain.  Patient states that she had gotten out of bed and tried to use her walker and had a mechanical fall landing on her back.  She was on the ground for approximately 1 hour until found by skilled nursing facility staff.  She arrives with no additional complaints and states that her back pain has basically resolved.  Denies numbness, tingling, weakness of lower extremities.  Denies chest pain, shortness of breath, abdominal pain, nausea, vomiting or other systemic or traumatic complaints.   Fall Pertinent negatives include no chest pain, no abdominal pain and no shortness of breath.      Past Medical History:  Diagnosis Date   Breast cancer ([place])    bilaterally   CAD (coronary artery disease)    w/ DES to LAD with residual diagonal disease to small for PCI 6/11 and repeat DES to LAD for restenosis 3/12   Cardiomyopathy ([place])    Mixed CM; s/p St. Jude CRT-P implant 10/24 w/ LV lead revision 10/25 . Echo 11-2017 EF 55%.     Chronic diastolic (congestive) heart failure (HCC)    Dyslipidemia, goal LDL below 70    GERD (gastroesophageal reflux disease)    occ   Hematuria 02/23/2013   w/clots required hospital admission on 02/23/2013   Hypertension    LBBB (left bundle branch block)    Macular degeneration    Mild mitral regurgitation    Osteoarthritis    "right knee" (02/23/2013)   Osteopenia    Pacemaker    Pulmonary HTN (HCC)    PASP 65mHg by echo 43/7902  Umbilical hernia    Repaired on 01/10/2013    Vitamin B12 deficiency     Patient Active Problem List   Diagnosis Date Noted   HLD (hyperlipidemia) 10/07/2017   Fall 10/07/2017   Closed displaced fracture of left femoral neck  ([place] 10/07/2017   CKD (chronic kidney disease), stage III ([place] 10/07/2017   Anemia 07/23/2016   [place]ulmonary HTN ([place] 11/22/2015   SOB (shortness of breath) 11/22/2015   Dyslipidemia 08/10/2013   Benign essential HTN 08/10/2013   Cardiomyopathy ([place]    CAD (coronary artery disease)    Urinary retention 02/23/2013   Pacemaker-Medtronic-CRT 05/28/2012   Left bundle branch block 05/11/2012   Chronic diastolic CHF (congestive heart failure) ([place] 05/11/2012   Edema-chronic lower extremity X decades 05/11/2012   DEGENERATIVE JOINT DISEASE 04/18/2010   HIP PAIN, RIGHT 04/18/2010   KNEE PAIN, RIGHT, CHRONIC 02/28/2009   BACK PAIN, CHRONIC 02/28/2009   OTHER ACQUIRED DEFORMITY OF ANKLE AND FOOT OTHER 02/28/2009    Past Surgical History:  Procedure Laterality Date   BI-VENTRICULAR PACEMAKER INSERTION N/A 05/27/2012   Procedure: BI-VENTRICULAR PACEMAKER INSERTION (CRT-P);  Surgeon: SDeboraha Sprang MD;  [facility];  Service: Cardiovascular;  Laterality: N/A;   BREAST BIOPSY  1995   bilaterally   CATARACT EXTRACTION     CATARACT EXTRACTION W/ INTRAOCULAR LENS  IMPLANT, BILATERAL  1990's   CORONARY ANGIOPLASTY WITH STENT PLACEMENT  2010   "1" (05/27/2012)   HERNIA REPAIR     HIP ARTHROPLASTY Left 10/08/2017   Procedure: LEFT HIP HIMIARTHROPLASTY;  Surgeon: DMelrose Nakayama  MD;  Location: [place];  Service: Orthopedics;  Laterality: Left;   INSERT / REPLACE / REMOVE PACEMAKER  05/27/2012   initial placement - BiV PPM   INSERT / REPLACE / REMOVE PACEMAKER  05/31/2012   "lead change/repaired" (05/31/2012)   INSERTION OF MESH N/A 01/10/2013   Procedure: INSERTION OF MESH;  Surgeon: Haywood Lasso, MD;  Location: [place];  Service: General;  Laterality: N/A;   JOINT REPLACEMENT     LEAD REVISION N/A 05/28/2012   Procedure: LEAD REVISION;  Surgeon: Evans Lance, MD;  Location: [facility] C[facility] LAB;  Service: Cardiovascular;  Laterality: N/A;   LEAD REVISION Left 05/31/2012   Procedure: LEAD  REVISION;  Surgeon: Deboraha Sprang, MD;  Location: [facility] CATH LAB;  Service: Cardiovascular;  Laterality: Left;   MASTECTOMY  1995   bilaterally   TONSILLECTOMY     TOTAL HIP ARTHROPLASTY  8177   right   UMBILICAL HERNIA REPAIR N/A 01/10/2013   Procedure: HERNIA REPAIR UMBILICAL ADULT;  Surgeon: Haywood Lasso, MD;  Location: [place];  Service: General;  Laterality: N/A;     OB History   No obstetric history on file.     Family History  Problem Relation Age of Onset   Heart disease Mother    Breast cancer Mother    Heart disease Father    Breast cancer Maternal Aunt    Breast cancer Daughter    Breast cancer Maternal Aunt    Asthma Grandchild     Social History   Tobacco Use   Smoking status: Former    Packs/day: 1.00    Years: 20.00    Pack years: 20.00    Types: Cigarettes    Start date: 03/17/1943    Quit date: 01/05/1963    Years since quitting: 58.3   Smokeless tobacco: Never  Substance Use Topics   Alcohol use: Not Currently    Alcohol/week: 5.0 standard drinks    Types: 5 Glasses of wine per week   Drug use: No    Home Medications Prior to Admission medications   Medication Sig Start Date End Date Taking? Authorizing Provider  acetaminophen (TYLENOL) 500 MG tablet Take 500-1,000 mg by mouth every 6 (six) hours as needed.    [provider]  aspirin EC 81 MG tablet Take 81 mg by mouth daily.    [provider]  Cholecalciferol (VITAMIN D-3) 5000 UNITS TABS Take 5,000 Units by mouth daily.     [provider]  clopidogrel (PLAVIX) 75 MG tablet TAKE ONE TABLET DAILY 12/06/20   Sueanne Margarita, MD  Cyanocobalamin (B-12) 1000 MCG/ML KIT Inject 1,000 mcg as directed every 30 (thirty) days.    [provider]  furosemide (LASIX) 40 MG tablet TAKE ONE TABLET EACH DAY 12/06/20   Deboraha Sprang, MD  isosorbide mononitrate (IMDUR) 60 MG 24 hr tablet TAKE ONE AND ONE-HALF TABLETS DAILY 12/06/20   Sueanne Margarita, MD  Multiple Vitamin  (MULTIVITAMIN WITH MINERALS) TABS tablet Take 1 tablet by mouth daily. 10/13/17   Mikhail, Velta Addison, DO  nitroGLYCERIN (NITROSTAT) 0.4 MG SL tablet Place 1 tablet (0.4 mg total) under the tongue every 5 (five) minutes as needed for chest pain. 11/05/20   Sueanne Margarita, MD  omeprazole (PRILOSEC) 20 MG capsule Take 20 mg by mouth daily.    [provider]  simvastatin (ZOCOR) 20 MG tablet TAKE ONE TABLET EACH DAY AT 6PM 11/23/19   Sueanne Margarita, MD  traMADol Veatrice Bourbon)  50 MG tablet Take 50 mg by mouth as directed. Knee pain 10/25/19   [provider]  vitamin C (ASCORBIC ACID) 500 MG tablet Take 500 mg by mouth daily.    [provider]    Allergies    Celecoxib and Ramipril  Review of Systems   Review of Systems  Constitutional:  Negative for chills and fever.  HENT:  Negative for ear pain and sore throat.   Eyes:  Negative for pain and visual disturbance.  Respiratory:  Negative for cough and shortness of breath.   Cardiovascular:  Negative for chest pain and palpitations.  Gastrointestinal:  Negative for abdominal pain and vomiting.  Genitourinary:  Negative for dysuria and hematuria.  Musculoskeletal:  Positive for back pain. Negative for arthralgias.  Skin:  Negative for color change and rash.  Neurological:  Negative for seizures and syncope.  All other systems reviewed and are negative.  Physical Exam Updated Vital Signs BP 138/65   Pulse 83   Temp 97.9 F (36.6 C) (Oral)   Resp 11   Ht 5' 4"  (1.626 m)   Wt 63.5 kg   SpO2 94%   BMI 24.03 kg/m   Physical Exam Vitals and nursing note reviewed.  Constitutional:      General: She is not in acute distress.    Appearance: She is well-developed.  HENT:     Head: Normocephalic and atraumatic.  Eyes:     Conjunctiva/sclera: Conjunctivae normal.  Cardiovascular:     Rate and Rhythm: Normal rate and regular rhythm.     Heart sounds: No murmur heard. Pulmonary:     Effort: Pulmonary effort is normal.  No respiratory distress.     Breath sounds: Normal breath sounds.  Abdominal:     Palpations: Abdomen is soft.     Tenderness: There is no abdominal tenderness.  Musculoskeletal:        General: Tenderness (L-spine) present.     Cervical back: Neck supple.  Skin:    General: Skin is warm and dry.  Neurological:     Mental Status: She is alert.    ED Results / Procedures / Treatments   Labs (all labs ordered are listed, but only abnormal results are displayed) Labs Reviewed  COMPREHENSIVE METABOLIC PANEL - Abnormal; Notable for the following components:      Result Value   Glucose, Bld 104 (*)    BUN 29 (*)    Creatinine, Ser 1.41 (*)    Calcium 8.8 (*)    Total Protein 5.9 (*)    Albumin 2.9 (*)    Alkaline Phosphatase 161 (*)    GFR, Estimated 34 (*)    All other components within normal limits  CBC WITH DIFFERENTIAL/PLATELET - Abnormal; Notable for the following components:   RBC 3.32 (*)    Hemoglobin 11.0 (*)    HCT 34.6 (*)    MCV 104.2 (*)    Abs Immature Granulocytes 0.55 (*)    All other components within normal limits  BRAIN NATRIURETIC PEPTIDE - Abnormal; Notable for the following components:   B Natriuretic Peptide 243.6 (*)    All other components within normal limits  TROPONIN I (HIGH SENSITIVITY)    EKG None  Radiology CT Head Wo Contrast  Result Date: 05/09/2021 CLINICAL DATA:  Status post fall. EXAM: CT HEAD WITHOUT CONTRAST TECHNIQUE: Contiguous axial images were obtained from the base of the skull through the vertex without intravenous contrast. COMPARISON:  August 23, 2020 FINDINGS: Brain: There is  mild cerebral atrophy with widening of the extra-axial spaces and ventricular dilatation. There are areas of decreased attenuation within the white matter tracts of the supratentorial brain, consistent with microvascular disease changes. Vascular: No hyperdense vessel or unexpected calcification. Skull: Normal. Negative for fracture or focal lesion.  Sinuses/Orbits: No acute finding. Other: None. IMPRESSION: 1. Generalized cerebral atrophy. 2. No acute intracranial abnormality. Electronically Signed   By: Virgina Norfolk M.D.   On: 05/09/2021 21:47   DG Pelvis Portable  Result Date: 05/09/2021 CLINICAL DATA:  Fall. EXAM: PORTABLE PELVIS 1-2 VIEWS COMPARISON:  Right hip x-ray 04/17/2021 FINDINGS: Bilateral hip arthroplasties appear in anatomic alignment. There is no evidence for hardware loosening or acute fracture. The bones are diffusely osteopenic. There are vascular calcifications in the soft tissues. IMPRESSION: No acute fracture or dislocation. Bilateral hip arthroplasties in anatomic alignment. Electronically Signed   By: Ronney Asters M.D.   On: 05/09/2021 19:49   DG Chest Portable 1 View  Result Date: 05/09/2021 CLINICAL DATA:  Fall. EXAM: PORTABLE CHEST 1 VIEW COMPARISON:  Chest x-ray 10/12/2017. FINDINGS: Left-sided pacemaker is unchanged in position. Surgical clips overlie the bilateral axilla and right hilum. The cardiomediastinal silhouette is stable, the heart is enlarged. There is no lung consolidation, pleural effusion or pneumothorax. No acute fractures are seen. IMPRESSION: Cardiomegaly.  No acute cardiopulmonary process. Electronically Signed   By: Ronney Asters M.D.   On: 05/09/2021 19:45    Procedures Procedures   Medications Ordered in ED Medications - No data to display  ED Course  I have reviewed the triage vital signs and the nursing notes.  Pertinent labs & imaging results that were available during my care of the patient were reviewed by me and considered in my medical decision making (see chart for details).    MDM Rules/Calculators/A&P                           Patient seen emergency department for evaluation of a fall.  Physical exam reveals L-spine tenderness but is otherwise unremarkable.  Laboratory evaluation unremarkable outside of a mild elevation of the patient's BNP.  Patient has no shortness of  breath or hypoxia at this time.  CT imaging of the head is unremarkable.  Chest and pelvis x-ray unremarkable.  CT L-spine with an acute fracture involving the L4 vertebrae with the superior and inferior endplate as well as the anterior cortex of the vertebrae with approximately 25% loss of vertebral body height.  No significant retropulsion.  There is an additional age-indeterminate mild to moderate superior endplate compression fracture at L3 that is likely subacute.  I spoke with neurosurgery who recommended LSO bracing and follow-up with her primary care physician.  Brace applied in the emergency department and patient was discharged back to her facility. Final Clinical Impression(s) / ED Diagnoses Final diagnoses:  Fall    Rx / DC Orders ED Discharge Orders     None        Cambrie Sonnenfeld, Debe Coder, MD 05/09/21 2256

## 2021-05-09 NOTE — ED Notes (Signed)
Patient transported to CT 

## 2021-05-10 DIAGNOSIS — S32040A Wedge compression fracture of fourth lumbar vertebra, initial encounter for closed fracture: Secondary | ICD-10-CM | POA: Diagnosis not present

## 2021-05-10 NOTE — Progress Notes (Signed)
Orthopedic Tech Progress Note Patient Details:  [name] 1923/07/28 [id number]  Ortho Devices Type of Ortho Device: Lumbar corsett Ortho Device/Splint Location: Back Ortho Device/Splint Interventions: Ordered, Application, Adjustment   Post Interventions Patient Tolerated: Poor Instructions Provided: Adjustment of device, Care of device, Poper ambulation with device  Velvie Thomaston 05/10/2021, 12:12 AM

## 2021-05-10 NOTE — ED Notes (Addendum)
Report given to Sales executive at [employer]

## 2021-05-13 DIAGNOSIS — S32009A Unspecified fracture of unspecified lumbar vertebra, initial encounter for closed fracture: Secondary | ICD-10-CM | POA: Diagnosis not present

## 2021-05-14 ENCOUNTER — Ambulatory Visit (INDEPENDENT_AMBULATORY_CARE_PROVIDER_SITE_OTHER): Payer: Medicare Other

## 2021-05-14 DIAGNOSIS — I255 Ischemic cardiomyopathy: Secondary | ICD-10-CM

## 2021-05-14 LAB — CUP PACEART REMOTE DEVICE CHECK
Battery Remaining Longevity: 11 mo
Battery Voltage: 2.87 V
Brady Statistic AP VP Percent: 0.16 %
Brady Statistic AP VS Percent: 0 %
Brady Statistic AS VP Percent: 99.62 %
Brady Statistic AS VS Percent: 0.21 %
Brady Statistic RA Percent Paced: 0.17 %
Brady Statistic RV Percent Paced: 94.75 %
Date Time Interrogation Session: 20221011200958
Implantable Lead Implant Date: 20131024
Implantable Lead Implant Date: 20131024
Implantable Lead Implant Date: 20131028
Implantable Lead Location: 753858
Implantable Lead Location: 753859
Implantable Lead Location: 753860
Implantable Lead Model: 1944
Implantable Lead Model: 1948
Implantable Lead Model: 4194
Implantable Pulse Generator Implant Date: 20131024
Lead Channel Impedance Value: 304 Ohm
Lead Channel Impedance Value: 437 Ohm
Lead Channel Impedance Value: 437 Ohm
Lead Channel Impedance Value: 456 Ohm
Lead Channel Impedance Value: 475 Ohm
Lead Channel Impedance Value: 475 Ohm
Lead Channel Impedance Value: 570 Ohm
Lead Channel Impedance Value: 589 Ohm
Lead Channel Impedance Value: 646 Ohm
Lead Channel Pacing Threshold Amplitude: 0.625 V
Lead Channel Pacing Threshold Amplitude: 1 V
Lead Channel Pacing Threshold Pulse Width: 0.4 ms
Lead Channel Pacing Threshold Pulse Width: 0.4 ms
Lead Channel Sensing Intrinsic Amplitude: 1.75 mV
Lead Channel Sensing Intrinsic Amplitude: 1.75 mV
Lead Channel Sensing Intrinsic Amplitude: 6.25 mV
Lead Channel Sensing Intrinsic Amplitude: 6.25 mV
Lead Channel Setting Pacing Amplitude: 2 V
Lead Channel Setting Pacing Amplitude: 2 V
Lead Channel Setting Pacing Amplitude: 2.5 V
Lead Channel Setting Pacing Pulse Width: 0.4 ms
Lead Channel Setting Pacing Pulse Width: 0.4 ms
Lead Channel Setting Sensing Sensitivity: 2.8 mV

## 2021-05-15 DIAGNOSIS — Z20828 Contact with and (suspected) exposure to other viral communicable diseases: Secondary | ICD-10-CM | POA: Diagnosis not present

## 2021-05-15 DIAGNOSIS — M545 Low back pain, unspecified: Secondary | ICD-10-CM | POA: Diagnosis not present

## 2021-05-17 DIAGNOSIS — I5042 Chronic combined systolic (congestive) and diastolic (congestive) heart failure: Secondary | ICD-10-CM | POA: Diagnosis not present

## 2021-05-17 DIAGNOSIS — I272 Pulmonary hypertension, unspecified: Secondary | ICD-10-CM | POA: Diagnosis not present

## 2021-05-17 DIAGNOSIS — I130 Hypertensive heart and chronic kidney disease with heart failure and stage 1 through stage 4 chronic kidney disease, or unspecified chronic kidney disease: Secondary | ICD-10-CM | POA: Diagnosis not present

## 2021-05-17 DIAGNOSIS — E538 Deficiency of other specified B group vitamins: Secondary | ICD-10-CM | POA: Diagnosis not present

## 2021-05-17 DIAGNOSIS — I2510 Atherosclerotic heart disease of native coronary artery without angina pectoris: Secondary | ICD-10-CM | POA: Diagnosis not present

## 2021-05-17 DIAGNOSIS — N183 Chronic kidney disease, stage 3 unspecified: Secondary | ICD-10-CM | POA: Diagnosis not present

## 2021-05-21 DIAGNOSIS — I442 Atrioventricular block, complete: Secondary | ICD-10-CM | POA: Diagnosis not present

## 2021-05-21 DIAGNOSIS — F32A Depression, unspecified: Secondary | ICD-10-CM | POA: Diagnosis not present

## 2021-05-21 DIAGNOSIS — I2510 Atherosclerotic heart disease of native coronary artery without angina pectoris: Secondary | ICD-10-CM | POA: Diagnosis not present

## 2021-05-21 DIAGNOSIS — Z853 Personal history of malignant neoplasm of breast: Secondary | ICD-10-CM | POA: Diagnosis not present

## 2021-05-21 DIAGNOSIS — N183 Chronic kidney disease, stage 3 unspecified: Secondary | ICD-10-CM | POA: Diagnosis not present

## 2021-05-21 DIAGNOSIS — H3530 Unspecified macular degeneration: Secondary | ICD-10-CM | POA: Diagnosis not present

## 2021-05-21 DIAGNOSIS — R32 Unspecified urinary incontinence: Secondary | ICD-10-CM | POA: Diagnosis not present

## 2021-05-21 DIAGNOSIS — I509 Heart failure, unspecified: Secondary | ICD-10-CM | POA: Diagnosis not present

## 2021-05-21 DIAGNOSIS — M1711 Unilateral primary osteoarthritis, right knee: Secondary | ICD-10-CM | POA: Diagnosis not present

## 2021-05-21 DIAGNOSIS — Z9181 History of falling: Secondary | ICD-10-CM | POA: Diagnosis not present

## 2021-05-21 DIAGNOSIS — K219 Gastro-esophageal reflux disease without esophagitis: Secondary | ICD-10-CM | POA: Diagnosis not present

## 2021-05-21 DIAGNOSIS — I5042 Chronic combined systolic (congestive) and diastolic (congestive) heart failure: Secondary | ICD-10-CM | POA: Diagnosis not present

## 2021-05-21 DIAGNOSIS — I130 Hypertensive heart and chronic kidney disease with heart failure and stage 1 through stage 4 chronic kidney disease, or unspecified chronic kidney disease: Secondary | ICD-10-CM | POA: Diagnosis not present

## 2021-05-21 DIAGNOSIS — Z7902 Long term (current) use of antithrombotics/antiplatelets: Secondary | ICD-10-CM | POA: Diagnosis not present

## 2021-05-21 DIAGNOSIS — Z7982 Long term (current) use of aspirin: Secondary | ICD-10-CM | POA: Diagnosis not present

## 2021-05-21 DIAGNOSIS — I340 Nonrheumatic mitral (valve) insufficiency: Secondary | ICD-10-CM | POA: Diagnosis not present

## 2021-05-21 DIAGNOSIS — E785 Hyperlipidemia, unspecified: Secondary | ICD-10-CM | POA: Diagnosis not present

## 2021-05-21 DIAGNOSIS — Z87891 Personal history of nicotine dependence: Secondary | ICD-10-CM | POA: Diagnosis not present

## 2021-05-21 DIAGNOSIS — I447 Left bundle-branch block, unspecified: Secondary | ICD-10-CM | POA: Diagnosis not present

## 2021-05-21 DIAGNOSIS — E538 Deficiency of other specified B group vitamins: Secondary | ICD-10-CM | POA: Diagnosis not present

## 2021-05-21 DIAGNOSIS — I429 Cardiomyopathy, unspecified: Secondary | ICD-10-CM | POA: Diagnosis not present

## 2021-05-21 DIAGNOSIS — Z79899 Other long term (current) drug therapy: Secondary | ICD-10-CM | POA: Diagnosis not present

## 2021-05-21 DIAGNOSIS — I272 Pulmonary hypertension, unspecified: Secondary | ICD-10-CM | POA: Diagnosis not present

## 2021-05-22 DIAGNOSIS — M545 Low back pain, unspecified: Secondary | ICD-10-CM | POA: Diagnosis not present

## 2021-05-22 DIAGNOSIS — Z20828 Contact with and (suspected) exposure to other viral communicable diseases: Secondary | ICD-10-CM | POA: Diagnosis not present

## 2021-05-22 NOTE — Progress Notes (Signed)
Remote pacemaker transmission.   

## 2021-05-23 DIAGNOSIS — I272 Pulmonary hypertension, unspecified: Secondary | ICD-10-CM | POA: Diagnosis not present

## 2021-05-23 DIAGNOSIS — I5042 Chronic combined systolic (congestive) and diastolic (congestive) heart failure: Secondary | ICD-10-CM | POA: Diagnosis not present

## 2021-05-23 DIAGNOSIS — E538 Deficiency of other specified B group vitamins: Secondary | ICD-10-CM | POA: Diagnosis not present

## 2021-05-23 DIAGNOSIS — N183 Chronic kidney disease, stage 3 unspecified: Secondary | ICD-10-CM | POA: Diagnosis not present

## 2021-05-23 DIAGNOSIS — I2510 Atherosclerotic heart disease of native coronary artery without angina pectoris: Secondary | ICD-10-CM | POA: Diagnosis not present

## 2021-05-23 DIAGNOSIS — I130 Hypertensive heart and chronic kidney disease with heart failure and stage 1 through stage 4 chronic kidney disease, or unspecified chronic kidney disease: Secondary | ICD-10-CM | POA: Diagnosis not present

## 2021-05-27 DIAGNOSIS — M545 Low back pain, unspecified: Secondary | ICD-10-CM | POA: Diagnosis not present

## 2021-05-28 DIAGNOSIS — I2510 Atherosclerotic heart disease of native coronary artery without angina pectoris: Secondary | ICD-10-CM | POA: Diagnosis not present

## 2021-05-28 DIAGNOSIS — N183 Chronic kidney disease, stage 3 unspecified: Secondary | ICD-10-CM | POA: Diagnosis not present

## 2021-05-28 DIAGNOSIS — I5042 Chronic combined systolic (congestive) and diastolic (congestive) heart failure: Secondary | ICD-10-CM | POA: Diagnosis not present

## 2021-05-28 DIAGNOSIS — E538 Deficiency of other specified B group vitamins: Secondary | ICD-10-CM | POA: Diagnosis not present

## 2021-05-28 DIAGNOSIS — I130 Hypertensive heart and chronic kidney disease with heart failure and stage 1 through stage 4 chronic kidney disease, or unspecified chronic kidney disease: Secondary | ICD-10-CM | POA: Diagnosis not present

## 2021-05-28 DIAGNOSIS — I272 Pulmonary hypertension, unspecified: Secondary | ICD-10-CM | POA: Diagnosis not present

## 2021-05-29 DIAGNOSIS — M545 Low back pain, unspecified: Secondary | ICD-10-CM | POA: Diagnosis not present

## 2021-05-31 DIAGNOSIS — I272 Pulmonary hypertension, unspecified: Secondary | ICD-10-CM | POA: Diagnosis not present

## 2021-05-31 DIAGNOSIS — N183 Chronic kidney disease, stage 3 unspecified: Secondary | ICD-10-CM | POA: Diagnosis not present

## 2021-05-31 DIAGNOSIS — I5042 Chronic combined systolic (congestive) and diastolic (congestive) heart failure: Secondary | ICD-10-CM | POA: Diagnosis not present

## 2021-05-31 DIAGNOSIS — E538 Deficiency of other specified B group vitamins: Secondary | ICD-10-CM | POA: Diagnosis not present

## 2021-05-31 DIAGNOSIS — I2510 Atherosclerotic heart disease of native coronary artery without angina pectoris: Secondary | ICD-10-CM | POA: Diagnosis not present

## 2021-05-31 DIAGNOSIS — I130 Hypertensive heart and chronic kidney disease with heart failure and stage 1 through stage 4 chronic kidney disease, or unspecified chronic kidney disease: Secondary | ICD-10-CM | POA: Diagnosis not present

## 2021-06-03 ENCOUNTER — Telehealth: Payer: Self-pay

## 2021-06-03 DIAGNOSIS — L89109 Pressure ulcer of unspecified part of back, unspecified stage: Secondary | ICD-10-CM | POA: Diagnosis not present

## 2021-06-03 DIAGNOSIS — E538 Deficiency of other specified B group vitamins: Secondary | ICD-10-CM | POA: Diagnosis not present

## 2021-06-03 DIAGNOSIS — I130 Hypertensive heart and chronic kidney disease with heart failure and stage 1 through stage 4 chronic kidney disease, or unspecified chronic kidney disease: Secondary | ICD-10-CM | POA: Diagnosis not present

## 2021-06-03 DIAGNOSIS — I272 Pulmonary hypertension, unspecified: Secondary | ICD-10-CM | POA: Diagnosis not present

## 2021-06-03 DIAGNOSIS — I2510 Atherosclerotic heart disease of native coronary artery without angina pectoris: Secondary | ICD-10-CM | POA: Diagnosis not present

## 2021-06-03 DIAGNOSIS — I5042 Chronic combined systolic (congestive) and diastolic (congestive) heart failure: Secondary | ICD-10-CM | POA: Diagnosis not present

## 2021-06-03 DIAGNOSIS — N183 Chronic kidney disease, stage 3 unspecified: Secondary | ICD-10-CM | POA: Diagnosis not present

## 2021-06-03 NOTE — Telephone Encounter (Signed)
Opened in error

## 2021-06-04 DIAGNOSIS — I272 Pulmonary hypertension, unspecified: Secondary | ICD-10-CM | POA: Diagnosis not present

## 2021-06-04 DIAGNOSIS — I2510 Atherosclerotic heart disease of native coronary artery without angina pectoris: Secondary | ICD-10-CM | POA: Diagnosis not present

## 2021-06-04 DIAGNOSIS — N183 Chronic kidney disease, stage 3 unspecified: Secondary | ICD-10-CM | POA: Diagnosis not present

## 2021-06-04 DIAGNOSIS — I5042 Chronic combined systolic (congestive) and diastolic (congestive) heart failure: Secondary | ICD-10-CM | POA: Diagnosis not present

## 2021-06-04 DIAGNOSIS — E538 Deficiency of other specified B group vitamins: Secondary | ICD-10-CM | POA: Diagnosis not present

## 2021-06-04 DIAGNOSIS — I130 Hypertensive heart and chronic kidney disease with heart failure and stage 1 through stage 4 chronic kidney disease, or unspecified chronic kidney disease: Secondary | ICD-10-CM | POA: Diagnosis not present

## 2021-06-05 DIAGNOSIS — I2510 Atherosclerotic heart disease of native coronary artery without angina pectoris: Secondary | ICD-10-CM | POA: Diagnosis not present

## 2021-06-05 DIAGNOSIS — E538 Deficiency of other specified B group vitamins: Secondary | ICD-10-CM | POA: Diagnosis not present

## 2021-06-05 DIAGNOSIS — Z20822 Contact with and (suspected) exposure to covid-19: Secondary | ICD-10-CM | POA: Diagnosis not present

## 2021-06-05 DIAGNOSIS — I272 Pulmonary hypertension, unspecified: Secondary | ICD-10-CM | POA: Diagnosis not present

## 2021-06-05 DIAGNOSIS — I130 Hypertensive heart and chronic kidney disease with heart failure and stage 1 through stage 4 chronic kidney disease, or unspecified chronic kidney disease: Secondary | ICD-10-CM | POA: Diagnosis not present

## 2021-06-05 DIAGNOSIS — N183 Chronic kidney disease, stage 3 unspecified: Secondary | ICD-10-CM | POA: Diagnosis not present

## 2021-06-05 DIAGNOSIS — I5042 Chronic combined systolic (congestive) and diastolic (congestive) heart failure: Secondary | ICD-10-CM | POA: Diagnosis not present

## 2021-06-06 DIAGNOSIS — N183 Chronic kidney disease, stage 3 unspecified: Secondary | ICD-10-CM | POA: Diagnosis not present

## 2021-06-06 DIAGNOSIS — I272 Pulmonary hypertension, unspecified: Secondary | ICD-10-CM | POA: Diagnosis not present

## 2021-06-06 DIAGNOSIS — I2510 Atherosclerotic heart disease of native coronary artery without angina pectoris: Secondary | ICD-10-CM | POA: Diagnosis not present

## 2021-06-06 DIAGNOSIS — I130 Hypertensive heart and chronic kidney disease with heart failure and stage 1 through stage 4 chronic kidney disease, or unspecified chronic kidney disease: Secondary | ICD-10-CM | POA: Diagnosis not present

## 2021-06-06 DIAGNOSIS — I5042 Chronic combined systolic (congestive) and diastolic (congestive) heart failure: Secondary | ICD-10-CM | POA: Diagnosis not present

## 2021-06-06 DIAGNOSIS — E538 Deficiency of other specified B group vitamins: Secondary | ICD-10-CM | POA: Diagnosis not present

## 2021-06-07 ENCOUNTER — Non-Acute Institutional Stay: Payer: Medicare Other | Admitting: Hospice

## 2021-06-07 ENCOUNTER — Other Ambulatory Visit: Payer: Self-pay

## 2021-06-07 DIAGNOSIS — W19XXXD Unspecified fall, subsequent encounter: Secondary | ICD-10-CM | POA: Diagnosis not present

## 2021-06-07 DIAGNOSIS — I5042 Chronic combined systolic (congestive) and diastolic (congestive) heart failure: Secondary | ICD-10-CM | POA: Diagnosis not present

## 2021-06-07 DIAGNOSIS — N183 Chronic kidney disease, stage 3 unspecified: Secondary | ICD-10-CM | POA: Diagnosis not present

## 2021-06-07 DIAGNOSIS — L89129 Pressure ulcer of left upper back, unspecified stage: Secondary | ICD-10-CM | POA: Diagnosis not present

## 2021-06-07 DIAGNOSIS — I2510 Atherosclerotic heart disease of native coronary artery without angina pectoris: Secondary | ICD-10-CM | POA: Diagnosis not present

## 2021-06-07 DIAGNOSIS — I5032 Chronic diastolic (congestive) heart failure: Secondary | ICD-10-CM

## 2021-06-07 DIAGNOSIS — M159 Polyosteoarthritis, unspecified: Secondary | ICD-10-CM

## 2021-06-07 DIAGNOSIS — I130 Hypertensive heart and chronic kidney disease with heart failure and stage 1 through stage 4 chronic kidney disease, or unspecified chronic kidney disease: Secondary | ICD-10-CM | POA: Diagnosis not present

## 2021-06-07 DIAGNOSIS — Z515 Encounter for palliative care: Secondary | ICD-10-CM | POA: Diagnosis not present

## 2021-06-07 DIAGNOSIS — E538 Deficiency of other specified B group vitamins: Secondary | ICD-10-CM | POA: Diagnosis not present

## 2021-06-07 DIAGNOSIS — I272 Pulmonary hypertension, unspecified: Secondary | ICD-10-CM | POA: Diagnosis not present

## 2021-06-07 NOTE — Progress Notes (Signed)
[facility] Consult Note Telephone: [phone]  Fax: [phone]  PATIENT NAME: [name] [id number] [phone] (home)  DOB: 03-22-1923 MRN: [id number]  PRIMARY CARE PROVIDER:    Gaynelle Arabian, MD,  [place] [place] [address] [phone]  REFERRING PROVIDER:   Gaynelle Arabian, MD [address] [place],  [place] [address] [phone]  RESPONSIBLE PARTY:  Debr Contact Information     Name Relation Home Work Mobile   [place] Daughter   [phone]   Dorothea Glassman Daughter   [phone]        I met face to face with patient and family at facility. Palliative Care was asked to follow this patient by consultation request of  Gaynelle Arabian, MD to address advance care planning, complex medical decision making and goals of care clarification. Hilda Blades is present with patient during visit. This is the initial visit.    ASSESSMENT AND / RECOMMENDATIONS:   Advance Care Planning: Our advance care planning conversation included a discussion about:    The value and importance of advance care planning  Difference between Hospice and Palliative care Exploration of goals of care in the event of a sudden injury or illness  Identification and preparation of a healthcare agent  Review and updating or creation of an  advance directive document . Decision not to resuscitate or to de-escalate disease focused treatments due to poor prognosis.  CODE STATUS: Patient is a Do Not Resuscitate.  Goals of Care: Goals include to maximize quality of life and symptom management. Patient/family is interested in hospice service in the future  I spent  46 minutes providing this initial consultation. More than 50% of the time in this consultation was spent on counseling patient and coordinating  communication. --------------------------------------------------------------------------------------------------------------------------------------  Symptom Management/Plan: Gait imbalance:Fall- last month with L4 fracture treated with brace. Followed by Ortho. Physical therapy is ongoing for strengthening and gait training. Pressure wound: at the back. Debra to follow up with wound appointment with wound center. Patient on antibiotic for the wound from her Ortho. Education to take it as prescribed and to completion. Ensure nutritional drink BID recommended to augment nutrition and promote wound healing.  CHF: Managed with Lasix. Education provided on reduced salt.  Bil knee Osteoarthritis:Managed with Tylenol, Tramadol. Continue physical activity as tolerated to optimize wellbeing.  Senile degeneration of brain: short term memory is declining steadily, incontinent of bladder, non ambulatory.  Follow up: Palliative care will continue to follow for complex medical decision making, advance care planning, and clarification of goals. Return 6 weeks or prn.Encouraged to call provider sooner with any concerns.   Family /Caregiver/Community Supports: Patient in [place] for ongoing care.   HOSPICE ELIGIBILITY/DIAGNOSIS: TBD  Chief Complaint: Initial Palliative care visit  HISTORY OF PRESENT ILLNESS:  [name] is a 87 y.o. year old female  with multiple medical conditions including acute on chronic gait imbalance worsened in the last month resulting in a fall last month for which she was seen in the ED. Fall resulted in L4 fracture treated conservatively with brace; followed by Ortho.  Gait imbalance impairs her ADLs and quality of life. Patient reports ongoing physical therapy is helpful; patient denies pain/discomfort. History of GERD, Osteoarthritis, CHF.  History obtained from review of EMR, discussion with primary team, caregiver, family and/or Ms. [name].  Review and summarization of Epic records  shows history from other than patient.  Rest of 10 point ROS asked and negative.     Review of lab tests/diagnostics   Results for Monica, Chang (MRN [id number]) as of 06/07/2021 13:07  Ref. Range 05/09/2021 19:26  Sodium Latest Ref Range: 135 - 145 mmol/L 139  Potassium Latest Ref Range: 3.5 - 5.1 mmol/L 4.0  Chloride Latest Ref Range: 98 - 111 mmol/L 103  CO2 Latest Ref Range: 22 - 32 mmol/L 28  Glucose Latest Ref Range: 70 - 99 mg/dL 104 (H)  BUN Latest Ref Range: 8 - 23 mg/dL 29 (H)  Creatinine Latest Ref Range: 0.44 - 1.00 mg/dL 1.41 (H)  Calcium Latest Ref Range: 8.9 - 10.3 mg/dL 8.8 (L)  Anion gap Latest Ref Range: 5 - 15  8  Alkaline Phosphatase Latest Ref Range: 38 - 126 U/L 161 (H)  Albumin Latest Ref Range: 3.5 - 5.0 g/dL 2.9 (L)  AST Latest Ref Range: 15 - 41 U/L 17  ALT Latest Ref Range: 0 - 44 U/L 11  Total Protein Latest Ref Range: 6.5 - 8.1 g/dL 5.9 (L)  Total Bilirubin Latest Ref Range: 0.3 - 1.2 mg/dL 0.9  GFR, Estimated Latest Ref Range: >60 mL/min 34 (L)    ROS General: NAD EYES: denies vision changes, daughter reports patient has extreme impaired vision; eye doctor said  all options have been tried and nothing more to do.  ENMT: denies dysphagia Cardiovascular: denies chest pain/discomfort Pulmonary: denies cough, denies SOB Abdomen: endorses good appetite, denies constipation/diarrhea GU: denies dysuria, urinary frequency MSK:  endorses weakness,  no falls reported Skin: denies rashes or wounds Neurological: denies pain, denies insomnia Psych: Endorses positive mood Heme/lymph/immuno: denies bruises, abnormal bleeding  Physical Exam: Height/Weight: 5 feet  3 inches/130 Ibs Constitutional: NAD General: Well groomed, cooperative EYES: anicteric sclera, lids intact, no discharge  ENMT: Moist mucous membrane CV: S1 S2, RRR, no LE edema Pulmonary: LCTA, no increased work of breathing, no cough, Abdomen: active BS + 4 quadrants, soft and non tender GU: no  suprapubic tenderness MSK: weakness, sarcopenia, limited ROM, gait imbalance, non ambulatory, assistance to stand Skin: warm and dry, pressure wound at back from brace, dressing is clean, dry and intact.  Neuro:  weakness, otherwise non focal, forgetful Psych: non-anxious affect Hem/lymph/immuno: no widespread bruising   PAST MEDICAL HISTORY:  Active Ambulatory Problems    Diagnosis Date Noted   DEGENERATIVE JOINT DISEASE 04/18/2010   HIP PAIN, RIGHT 04/18/2010   KNEE PAIN, RIGHT, CHRONIC 02/28/2009   BACK PAIN, CHRONIC 02/28/2009   OTHER ACQUIRED DEFORMITY OF ANKLE AND FOOT OTHER 02/28/2009   Left bundle branch block 05/11/2012   Chronic diastolic CHF (congestive heart failure) ([place]) 05/11/2012   Edema-chronic lower extremity X decades 05/11/2012   Pacemaker-Medtronic-CRT 05/28/2012   Urinary retention 02/23/2013   Cardiomyopathy ([place])    CAD (coronary artery disease)    Dyslipidemia 08/10/2013   Benign essential HTN 08/10/2013   Pulmonary HTN ([place]) 11/22/2015   SOB (shortness of breath) 11/22/2015   Anemia 07/23/2016   HLD (hyperlipidemia) 10/07/2017   Fall 10/07/2017   Closed displaced fracture of left femoral neck ([place]) 10/07/2017   CKD (chronic kidney disease), stage III ([place]) 10/07/2017   Resolved Ambulatory Problems    Diagnosis Date Noted   Umbilical hernia 36/46/8032   UTI (lower urinary tract infection) 02/23/2013   Hematuria 02/23/2013   Past Medical History:  Diagnosis Date   Breast cancer ([place])    Chronic diastolic (congestive) heart failure (HCC)    Dyslipidemia, goal LDL below  70    GERD (gastroesophageal reflux disease)    Hypertension    LBBB (left bundle branch block)    Macular degeneration    Mild mitral regurgitation    Osteoarthritis    Osteopenia    Vitamin B12 deficiency     SOCIAL HX:  Social History   Tobacco Use   Smoking status: Former    Packs/day: 1.00    Years: 20.00    Pack years: 20.00    Types: Cigarettes    Start date:  03/17/1943    Quit date: 01/05/1963    Years since quitting: 58.4   Smokeless tobacco: Never  Substance Use Topics   Alcohol use: Not Currently    Alcohol/week: 5.0 standard drinks    Types: 5 Glasses of wine per week     FAMILY HX:  Family History  Problem Relation Age of Onset   Heart disease Mother    Breast cancer Mother    Heart disease Father    Breast cancer Maternal Aunt    Breast cancer Daughter    Breast cancer Maternal Aunt    Asthma Grandchild       ALLERGIES:  Allergies  Allergen Reactions   Celecoxib Swelling, Cough and Other (See Comments)    Reported by Dr. Andrew Au office on 07/22/2016.    Ramipril Cough and Other (See Comments)      PERTINENT MEDICATIONS:  Outpatient Encounter Medications as of 06/07/2021  Medication Sig   acetaminophen (TYLENOL) 500 MG tablet Take 500-1,000 mg by mouth every 6 (six) hours as needed.   aspirin EC 81 MG tablet Take 81 mg by mouth daily.   Cholecalciferol (VITAMIN D-3) 5000 UNITS TABS Take 5,000 Units by mouth daily.    clopidogrel (PLAVIX) 75 MG tablet TAKE ONE TABLET DAILY   Cyanocobalamin (B-12) 1000 MCG/ML KIT Inject 1,000 mcg as directed every 30 (thirty) days.   furosemide (LASIX) 40 MG tablet TAKE ONE TABLET EACH DAY   isosorbide mononitrate (IMDUR) 60 MG 24 hr tablet TAKE ONE AND ONE-HALF TABLETS DAILY   Multiple Vitamin (MULTIVITAMIN WITH MINERALS) TABS tablet Take 1 tablet by mouth daily.   nitroGLYCERIN (NITROSTAT) 0.4 MG SL tablet Place 1 tablet (0.4 mg total) under the tongue every 5 (five) minutes as needed for chest pain.   omeprazole (PRILOSEC) 20 MG capsule Take 20 mg by mouth daily.   simvastatin (ZOCOR) 20 MG tablet TAKE ONE TABLET EACH DAY AT 6PM   traMADol (ULTRAM) 50 MG tablet Take 50 mg by mouth as directed. Knee pain   vitamin C (ASCORBIC ACID) 500 MG tablet Take 500 mg by mouth daily.   No facility-administered encounter medications on file as of 06/07/2021.   Thank you for the opportunity to  participate in the care of Ms. [name].  The palliative care team will continue to follow. Please call our office at [phone] if we can be of additional assistance.   Note: Portions of this note were generated with [employer]. Dictation errors may occur despite best attempts at proofreading.  Teodoro Spray, NP

## 2021-06-10 DIAGNOSIS — I272 Pulmonary hypertension, unspecified: Secondary | ICD-10-CM | POA: Diagnosis not present

## 2021-06-10 DIAGNOSIS — I130 Hypertensive heart and chronic kidney disease with heart failure and stage 1 through stage 4 chronic kidney disease, or unspecified chronic kidney disease: Secondary | ICD-10-CM | POA: Diagnosis not present

## 2021-06-10 DIAGNOSIS — N183 Chronic kidney disease, stage 3 unspecified: Secondary | ICD-10-CM | POA: Diagnosis not present

## 2021-06-10 DIAGNOSIS — I2510 Atherosclerotic heart disease of native coronary artery without angina pectoris: Secondary | ICD-10-CM | POA: Diagnosis not present

## 2021-06-10 DIAGNOSIS — I5042 Chronic combined systolic (congestive) and diastolic (congestive) heart failure: Secondary | ICD-10-CM | POA: Diagnosis not present

## 2021-06-10 DIAGNOSIS — E538 Deficiency of other specified B group vitamins: Secondary | ICD-10-CM | POA: Diagnosis not present

## 2021-06-12 ENCOUNTER — Encounter (HOSPITAL_BASED_OUTPATIENT_CLINIC_OR_DEPARTMENT_OTHER): Payer: Medicare Other | Admitting: Physician Assistant

## 2021-06-12 DIAGNOSIS — I130 Hypertensive heart and chronic kidney disease with heart failure and stage 1 through stage 4 chronic kidney disease, or unspecified chronic kidney disease: Secondary | ICD-10-CM | POA: Diagnosis not present

## 2021-06-12 DIAGNOSIS — I272 Pulmonary hypertension, unspecified: Secondary | ICD-10-CM | POA: Diagnosis not present

## 2021-06-12 DIAGNOSIS — E538 Deficiency of other specified B group vitamins: Secondary | ICD-10-CM | POA: Diagnosis not present

## 2021-06-12 DIAGNOSIS — N183 Chronic kidney disease, stage 3 unspecified: Secondary | ICD-10-CM | POA: Diagnosis not present

## 2021-06-12 DIAGNOSIS — I5042 Chronic combined systolic (congestive) and diastolic (congestive) heart failure: Secondary | ICD-10-CM | POA: Diagnosis not present

## 2021-06-12 DIAGNOSIS — Z20822 Contact with and (suspected) exposure to covid-19: Secondary | ICD-10-CM | POA: Diagnosis not present

## 2021-06-12 DIAGNOSIS — I2510 Atherosclerotic heart disease of native coronary artery without angina pectoris: Secondary | ICD-10-CM | POA: Diagnosis not present

## 2021-06-13 DIAGNOSIS — N183 Chronic kidney disease, stage 3 unspecified: Secondary | ICD-10-CM | POA: Diagnosis not present

## 2021-06-13 DIAGNOSIS — I5042 Chronic combined systolic (congestive) and diastolic (congestive) heart failure: Secondary | ICD-10-CM | POA: Diagnosis not present

## 2021-06-13 DIAGNOSIS — I2510 Atherosclerotic heart disease of native coronary artery without angina pectoris: Secondary | ICD-10-CM | POA: Diagnosis not present

## 2021-06-13 DIAGNOSIS — E538 Deficiency of other specified B group vitamins: Secondary | ICD-10-CM | POA: Diagnosis not present

## 2021-06-13 DIAGNOSIS — I130 Hypertensive heart and chronic kidney disease with heart failure and stage 1 through stage 4 chronic kidney disease, or unspecified chronic kidney disease: Secondary | ICD-10-CM | POA: Diagnosis not present

## 2021-06-13 DIAGNOSIS — I272 Pulmonary hypertension, unspecified: Secondary | ICD-10-CM | POA: Diagnosis not present

## 2021-06-17 DIAGNOSIS — I272 Pulmonary hypertension, unspecified: Secondary | ICD-10-CM | POA: Diagnosis not present

## 2021-06-17 DIAGNOSIS — I5042 Chronic combined systolic (congestive) and diastolic (congestive) heart failure: Secondary | ICD-10-CM | POA: Diagnosis not present

## 2021-06-17 DIAGNOSIS — N183 Chronic kidney disease, stage 3 unspecified: Secondary | ICD-10-CM | POA: Diagnosis not present

## 2021-06-17 DIAGNOSIS — I130 Hypertensive heart and chronic kidney disease with heart failure and stage 1 through stage 4 chronic kidney disease, or unspecified chronic kidney disease: Secondary | ICD-10-CM | POA: Diagnosis not present

## 2021-06-17 DIAGNOSIS — E538 Deficiency of other specified B group vitamins: Secondary | ICD-10-CM | POA: Diagnosis not present

## 2021-06-17 DIAGNOSIS — I2510 Atherosclerotic heart disease of native coronary artery without angina pectoris: Secondary | ICD-10-CM | POA: Diagnosis not present

## 2021-06-18 DIAGNOSIS — I272 Pulmonary hypertension, unspecified: Secondary | ICD-10-CM | POA: Diagnosis not present

## 2021-06-18 DIAGNOSIS — I130 Hypertensive heart and chronic kidney disease with heart failure and stage 1 through stage 4 chronic kidney disease, or unspecified chronic kidney disease: Secondary | ICD-10-CM | POA: Diagnosis not present

## 2021-06-18 DIAGNOSIS — I5042 Chronic combined systolic (congestive) and diastolic (congestive) heart failure: Secondary | ICD-10-CM | POA: Diagnosis not present

## 2021-06-18 DIAGNOSIS — I2510 Atherosclerotic heart disease of native coronary artery without angina pectoris: Secondary | ICD-10-CM | POA: Diagnosis not present

## 2021-06-18 DIAGNOSIS — E538 Deficiency of other specified B group vitamins: Secondary | ICD-10-CM | POA: Diagnosis not present

## 2021-06-18 DIAGNOSIS — N183 Chronic kidney disease, stage 3 unspecified: Secondary | ICD-10-CM | POA: Diagnosis not present

## 2021-06-19 DIAGNOSIS — Z8616 Personal history of COVID-19: Secondary | ICD-10-CM | POA: Diagnosis not present

## 2021-06-20 ENCOUNTER — Telehealth: Payer: Self-pay

## 2021-06-20 DIAGNOSIS — I272 Pulmonary hypertension, unspecified: Secondary | ICD-10-CM | POA: Diagnosis not present

## 2021-06-20 DIAGNOSIS — Z7982 Long term (current) use of aspirin: Secondary | ICD-10-CM | POA: Diagnosis not present

## 2021-06-20 DIAGNOSIS — I130 Hypertensive heart and chronic kidney disease with heart failure and stage 1 through stage 4 chronic kidney disease, or unspecified chronic kidney disease: Secondary | ICD-10-CM | POA: Diagnosis not present

## 2021-06-20 DIAGNOSIS — I509 Heart failure, unspecified: Secondary | ICD-10-CM | POA: Diagnosis not present

## 2021-06-20 DIAGNOSIS — F32A Depression, unspecified: Secondary | ICD-10-CM | POA: Diagnosis not present

## 2021-06-20 DIAGNOSIS — I429 Cardiomyopathy, unspecified: Secondary | ICD-10-CM | POA: Diagnosis not present

## 2021-06-20 DIAGNOSIS — R32 Unspecified urinary incontinence: Secondary | ICD-10-CM | POA: Diagnosis not present

## 2021-06-20 DIAGNOSIS — M1711 Unilateral primary osteoarthritis, right knee: Secondary | ICD-10-CM | POA: Diagnosis not present

## 2021-06-20 DIAGNOSIS — Z7902 Long term (current) use of antithrombotics/antiplatelets: Secondary | ICD-10-CM | POA: Diagnosis not present

## 2021-06-20 DIAGNOSIS — H3530 Unspecified macular degeneration: Secondary | ICD-10-CM | POA: Diagnosis not present

## 2021-06-20 DIAGNOSIS — E538 Deficiency of other specified B group vitamins: Secondary | ICD-10-CM | POA: Diagnosis not present

## 2021-06-20 DIAGNOSIS — I442 Atrioventricular block, complete: Secondary | ICD-10-CM | POA: Diagnosis not present

## 2021-06-20 DIAGNOSIS — Z79899 Other long term (current) drug therapy: Secondary | ICD-10-CM | POA: Diagnosis not present

## 2021-06-20 DIAGNOSIS — K219 Gastro-esophageal reflux disease without esophagitis: Secondary | ICD-10-CM | POA: Diagnosis not present

## 2021-06-20 DIAGNOSIS — I340 Nonrheumatic mitral (valve) insufficiency: Secondary | ICD-10-CM | POA: Diagnosis not present

## 2021-06-20 DIAGNOSIS — Z853 Personal history of malignant neoplasm of breast: Secondary | ICD-10-CM | POA: Diagnosis not present

## 2021-06-20 DIAGNOSIS — Z9181 History of falling: Secondary | ICD-10-CM | POA: Diagnosis not present

## 2021-06-20 DIAGNOSIS — I2510 Atherosclerotic heart disease of native coronary artery without angina pectoris: Secondary | ICD-10-CM | POA: Diagnosis not present

## 2021-06-20 DIAGNOSIS — I5042 Chronic combined systolic (congestive) and diastolic (congestive) heart failure: Secondary | ICD-10-CM | POA: Diagnosis not present

## 2021-06-20 DIAGNOSIS — E785 Hyperlipidemia, unspecified: Secondary | ICD-10-CM | POA: Diagnosis not present

## 2021-06-20 DIAGNOSIS — N183 Chronic kidney disease, stage 3 unspecified: Secondary | ICD-10-CM | POA: Diagnosis not present

## 2021-06-20 DIAGNOSIS — I447 Left bundle-branch block, unspecified: Secondary | ICD-10-CM | POA: Diagnosis not present

## 2021-06-20 DIAGNOSIS — Z87891 Personal history of nicotine dependence: Secondary | ICD-10-CM | POA: Diagnosis not present

## 2021-06-20 NOTE — Telephone Encounter (Signed)
836 am. Message received from [place], RN that family is seeking hospice referral due to a decline and non-healing wound.  Phone call made to PCP office requesting a hospice referral and to confirm PCP will continue to serve as patient's attending of record while under hospice.  Message to be sent to PCP team.

## 2021-06-20 NOTE — Telephone Encounter (Signed)
935 am.  Incoming call from [place] with PCP office.  Okay for hospice referral and PCP will continue to serve as attending of record while patient is under hospice services. PCP would like a call once patient is admitted to hospice with the terminal dx.  Referral center notified of above.

## 2021-06-21 DIAGNOSIS — I5042 Chronic combined systolic (congestive) and diastolic (congestive) heart failure: Secondary | ICD-10-CM | POA: Diagnosis not present

## 2021-06-21 DIAGNOSIS — E538 Deficiency of other specified B group vitamins: Secondary | ICD-10-CM | POA: Diagnosis not present

## 2021-06-21 DIAGNOSIS — N183 Chronic kidney disease, stage 3 unspecified: Secondary | ICD-10-CM | POA: Diagnosis not present

## 2021-06-21 DIAGNOSIS — I130 Hypertensive heart and chronic kidney disease with heart failure and stage 1 through stage 4 chronic kidney disease, or unspecified chronic kidney disease: Secondary | ICD-10-CM | POA: Diagnosis not present

## 2021-06-21 DIAGNOSIS — I2510 Atherosclerotic heart disease of native coronary artery without angina pectoris: Secondary | ICD-10-CM | POA: Diagnosis not present

## 2021-06-21 DIAGNOSIS — I272 Pulmonary hypertension, unspecified: Secondary | ICD-10-CM | POA: Diagnosis not present

## 2021-06-24 DIAGNOSIS — I5042 Chronic combined systolic (congestive) and diastolic (congestive) heart failure: Secondary | ICD-10-CM | POA: Diagnosis not present

## 2021-06-24 DIAGNOSIS — I272 Pulmonary hypertension, unspecified: Secondary | ICD-10-CM | POA: Diagnosis not present

## 2021-06-24 DIAGNOSIS — N183 Chronic kidney disease, stage 3 unspecified: Secondary | ICD-10-CM | POA: Diagnosis not present

## 2021-06-24 DIAGNOSIS — I2510 Atherosclerotic heart disease of native coronary artery without angina pectoris: Secondary | ICD-10-CM | POA: Diagnosis not present

## 2021-06-24 DIAGNOSIS — I130 Hypertensive heart and chronic kidney disease with heart failure and stage 1 through stage 4 chronic kidney disease, or unspecified chronic kidney disease: Secondary | ICD-10-CM | POA: Diagnosis not present

## 2021-06-24 DIAGNOSIS — E538 Deficiency of other specified B group vitamins: Secondary | ICD-10-CM | POA: Diagnosis not present

## 2021-06-25 DIAGNOSIS — I130 Hypertensive heart and chronic kidney disease with heart failure and stage 1 through stage 4 chronic kidney disease, or unspecified chronic kidney disease: Secondary | ICD-10-CM | POA: Diagnosis not present

## 2021-06-25 DIAGNOSIS — E785 Hyperlipidemia, unspecified: Secondary | ICD-10-CM | POA: Diagnosis not present

## 2021-06-25 DIAGNOSIS — G311 Senile degeneration of brain, not elsewhere classified: Secondary | ICD-10-CM | POA: Diagnosis not present

## 2021-06-25 DIAGNOSIS — F0283 Dementia in other diseases classified elsewhere, unspecified severity, with mood disturbance: Secondary | ICD-10-CM | POA: Diagnosis not present

## 2021-06-25 DIAGNOSIS — H3530 Unspecified macular degeneration: Secondary | ICD-10-CM | POA: Diagnosis not present

## 2021-06-25 DIAGNOSIS — I447 Left bundle-branch block, unspecified: Secondary | ICD-10-CM | POA: Diagnosis not present

## 2021-06-25 DIAGNOSIS — I5030 Unspecified diastolic (congestive) heart failure: Secondary | ICD-10-CM | POA: Diagnosis not present

## 2021-06-25 DIAGNOSIS — K219 Gastro-esophageal reflux disease without esophagitis: Secondary | ICD-10-CM | POA: Diagnosis not present

## 2021-06-25 DIAGNOSIS — N183 Chronic kidney disease, stage 3 unspecified: Secondary | ICD-10-CM | POA: Diagnosis not present

## 2021-06-25 DIAGNOSIS — S32049D Unspecified fracture of fourth lumbar vertebra, subsequent encounter for fracture with routine healing: Secondary | ICD-10-CM | POA: Diagnosis not present

## 2021-06-25 DIAGNOSIS — E538 Deficiency of other specified B group vitamins: Secondary | ICD-10-CM | POA: Diagnosis not present

## 2021-06-25 DIAGNOSIS — R634 Abnormal weight loss: Secondary | ICD-10-CM | POA: Diagnosis not present

## 2021-06-25 DIAGNOSIS — M810 Age-related osteoporosis without current pathological fracture: Secondary | ICD-10-CM | POA: Diagnosis not present

## 2021-06-25 DIAGNOSIS — I25119 Atherosclerotic heart disease of native coronary artery with unspecified angina pectoris: Secondary | ICD-10-CM | POA: Diagnosis not present

## 2021-06-25 DIAGNOSIS — I255 Ischemic cardiomyopathy: Secondary | ICD-10-CM | POA: Diagnosis not present

## 2021-06-26 DIAGNOSIS — I25119 Atherosclerotic heart disease of native coronary artery with unspecified angina pectoris: Secondary | ICD-10-CM | POA: Diagnosis not present

## 2021-06-26 DIAGNOSIS — I130 Hypertensive heart and chronic kidney disease with heart failure and stage 1 through stage 4 chronic kidney disease, or unspecified chronic kidney disease: Secondary | ICD-10-CM | POA: Diagnosis not present

## 2021-06-26 DIAGNOSIS — F0283 Dementia in other diseases classified elsewhere, unspecified severity, with mood disturbance: Secondary | ICD-10-CM | POA: Diagnosis not present

## 2021-06-26 DIAGNOSIS — Z20828 Contact with and (suspected) exposure to other viral communicable diseases: Secondary | ICD-10-CM | POA: Diagnosis not present

## 2021-06-26 DIAGNOSIS — I255 Ischemic cardiomyopathy: Secondary | ICD-10-CM | POA: Diagnosis not present

## 2021-06-26 DIAGNOSIS — S32049D Unspecified fracture of fourth lumbar vertebra, subsequent encounter for fracture with routine healing: Secondary | ICD-10-CM | POA: Diagnosis not present

## 2021-06-26 DIAGNOSIS — G311 Senile degeneration of brain, not elsewhere classified: Secondary | ICD-10-CM | POA: Diagnosis not present

## 2021-06-27 DIAGNOSIS — I130 Hypertensive heart and chronic kidney disease with heart failure and stage 1 through stage 4 chronic kidney disease, or unspecified chronic kidney disease: Secondary | ICD-10-CM | POA: Diagnosis not present

## 2021-06-27 DIAGNOSIS — I25119 Atherosclerotic heart disease of native coronary artery with unspecified angina pectoris: Secondary | ICD-10-CM | POA: Diagnosis not present

## 2021-06-27 DIAGNOSIS — F0283 Dementia in other diseases classified elsewhere, unspecified severity, with mood disturbance: Secondary | ICD-10-CM | POA: Diagnosis not present

## 2021-06-27 DIAGNOSIS — I255 Ischemic cardiomyopathy: Secondary | ICD-10-CM | POA: Diagnosis not present

## 2021-06-27 DIAGNOSIS — S32049D Unspecified fracture of fourth lumbar vertebra, subsequent encounter for fracture with routine healing: Secondary | ICD-10-CM | POA: Diagnosis not present

## 2021-06-27 DIAGNOSIS — G311 Senile degeneration of brain, not elsewhere classified: Secondary | ICD-10-CM | POA: Diagnosis not present

## 2021-06-28 DIAGNOSIS — F0283 Dementia in other diseases classified elsewhere, unspecified severity, with mood disturbance: Secondary | ICD-10-CM | POA: Diagnosis not present

## 2021-06-28 DIAGNOSIS — I130 Hypertensive heart and chronic kidney disease with heart failure and stage 1 through stage 4 chronic kidney disease, or unspecified chronic kidney disease: Secondary | ICD-10-CM | POA: Diagnosis not present

## 2021-06-28 DIAGNOSIS — I255 Ischemic cardiomyopathy: Secondary | ICD-10-CM | POA: Diagnosis not present

## 2021-06-28 DIAGNOSIS — S32049D Unspecified fracture of fourth lumbar vertebra, subsequent encounter for fracture with routine healing: Secondary | ICD-10-CM | POA: Diagnosis not present

## 2021-06-28 DIAGNOSIS — G311 Senile degeneration of brain, not elsewhere classified: Secondary | ICD-10-CM | POA: Diagnosis not present

## 2021-06-28 DIAGNOSIS — I25119 Atherosclerotic heart disease of native coronary artery with unspecified angina pectoris: Secondary | ICD-10-CM | POA: Diagnosis not present

## 2021-07-01 DIAGNOSIS — I130 Hypertensive heart and chronic kidney disease with heart failure and stage 1 through stage 4 chronic kidney disease, or unspecified chronic kidney disease: Secondary | ICD-10-CM | POA: Diagnosis not present

## 2021-07-01 DIAGNOSIS — F0283 Dementia in other diseases classified elsewhere, unspecified severity, with mood disturbance: Secondary | ICD-10-CM | POA: Diagnosis not present

## 2021-07-01 DIAGNOSIS — I25119 Atherosclerotic heart disease of native coronary artery with unspecified angina pectoris: Secondary | ICD-10-CM | POA: Diagnosis not present

## 2021-07-01 DIAGNOSIS — S32049D Unspecified fracture of fourth lumbar vertebra, subsequent encounter for fracture with routine healing: Secondary | ICD-10-CM | POA: Diagnosis not present

## 2021-07-01 DIAGNOSIS — G311 Senile degeneration of brain, not elsewhere classified: Secondary | ICD-10-CM | POA: Diagnosis not present

## 2021-07-01 DIAGNOSIS — Z1159 Encounter for screening for other viral diseases: Secondary | ICD-10-CM | POA: Diagnosis not present

## 2021-07-01 DIAGNOSIS — I255 Ischemic cardiomyopathy: Secondary | ICD-10-CM | POA: Diagnosis not present

## 2021-07-01 DIAGNOSIS — Z20828 Contact with and (suspected) exposure to other viral communicable diseases: Secondary | ICD-10-CM | POA: Diagnosis not present

## 2021-07-04 DIAGNOSIS — I5030 Unspecified diastolic (congestive) heart failure: Secondary | ICD-10-CM | POA: Diagnosis not present

## 2021-07-04 DIAGNOSIS — H3530 Unspecified macular degeneration: Secondary | ICD-10-CM | POA: Diagnosis not present

## 2021-07-04 DIAGNOSIS — M810 Age-related osteoporosis without current pathological fracture: Secondary | ICD-10-CM | POA: Diagnosis not present

## 2021-07-04 DIAGNOSIS — R634 Abnormal weight loss: Secondary | ICD-10-CM | POA: Diagnosis not present

## 2021-07-04 DIAGNOSIS — E785 Hyperlipidemia, unspecified: Secondary | ICD-10-CM | POA: Diagnosis not present

## 2021-07-04 DIAGNOSIS — I447 Left bundle-branch block, unspecified: Secondary | ICD-10-CM | POA: Diagnosis not present

## 2021-07-04 DIAGNOSIS — N183 Chronic kidney disease, stage 3 unspecified: Secondary | ICD-10-CM | POA: Diagnosis not present

## 2021-07-04 DIAGNOSIS — S32049D Unspecified fracture of fourth lumbar vertebra, subsequent encounter for fracture with routine healing: Secondary | ICD-10-CM | POA: Diagnosis not present

## 2021-07-04 DIAGNOSIS — K219 Gastro-esophageal reflux disease without esophagitis: Secondary | ICD-10-CM | POA: Diagnosis not present

## 2021-07-04 DIAGNOSIS — E538 Deficiency of other specified B group vitamins: Secondary | ICD-10-CM | POA: Diagnosis not present

## 2021-07-04 DIAGNOSIS — G311 Senile degeneration of brain, not elsewhere classified: Secondary | ICD-10-CM | POA: Diagnosis not present

## 2021-07-04 DIAGNOSIS — I25119 Atherosclerotic heart disease of native coronary artery with unspecified angina pectoris: Secondary | ICD-10-CM | POA: Diagnosis not present

## 2021-07-04 DIAGNOSIS — I130 Hypertensive heart and chronic kidney disease with heart failure and stage 1 through stage 4 chronic kidney disease, or unspecified chronic kidney disease: Secondary | ICD-10-CM | POA: Diagnosis not present

## 2021-07-04 DIAGNOSIS — F0283 Dementia in other diseases classified elsewhere, unspecified severity, with mood disturbance: Secondary | ICD-10-CM | POA: Diagnosis not present

## 2021-07-04 DIAGNOSIS — I255 Ischemic cardiomyopathy: Secondary | ICD-10-CM | POA: Diagnosis not present

## 2021-07-08 DIAGNOSIS — I25119 Atherosclerotic heart disease of native coronary artery with unspecified angina pectoris: Secondary | ICD-10-CM | POA: Diagnosis not present

## 2021-07-08 DIAGNOSIS — I130 Hypertensive heart and chronic kidney disease with heart failure and stage 1 through stage 4 chronic kidney disease, or unspecified chronic kidney disease: Secondary | ICD-10-CM | POA: Diagnosis not present

## 2021-07-08 DIAGNOSIS — G311 Senile degeneration of brain, not elsewhere classified: Secondary | ICD-10-CM | POA: Diagnosis not present

## 2021-07-08 DIAGNOSIS — F0283 Dementia in other diseases classified elsewhere, unspecified severity, with mood disturbance: Secondary | ICD-10-CM | POA: Diagnosis not present

## 2021-07-08 DIAGNOSIS — Z1159 Encounter for screening for other viral diseases: Secondary | ICD-10-CM | POA: Diagnosis not present

## 2021-07-08 DIAGNOSIS — I255 Ischemic cardiomyopathy: Secondary | ICD-10-CM | POA: Diagnosis not present

## 2021-07-08 DIAGNOSIS — S32049D Unspecified fracture of fourth lumbar vertebra, subsequent encounter for fracture with routine healing: Secondary | ICD-10-CM | POA: Diagnosis not present

## 2021-07-08 DIAGNOSIS — Z20828 Contact with and (suspected) exposure to other viral communicable diseases: Secondary | ICD-10-CM | POA: Diagnosis not present

## 2021-07-10 DIAGNOSIS — I130 Hypertensive heart and chronic kidney disease with heart failure and stage 1 through stage 4 chronic kidney disease, or unspecified chronic kidney disease: Secondary | ICD-10-CM | POA: Diagnosis not present

## 2021-07-10 DIAGNOSIS — G311 Senile degeneration of brain, not elsewhere classified: Secondary | ICD-10-CM | POA: Diagnosis not present

## 2021-07-10 DIAGNOSIS — I25119 Atherosclerotic heart disease of native coronary artery with unspecified angina pectoris: Secondary | ICD-10-CM | POA: Diagnosis not present

## 2021-07-10 DIAGNOSIS — I255 Ischemic cardiomyopathy: Secondary | ICD-10-CM | POA: Diagnosis not present

## 2021-07-10 DIAGNOSIS — F0283 Dementia in other diseases classified elsewhere, unspecified severity, with mood disturbance: Secondary | ICD-10-CM | POA: Diagnosis not present

## 2021-07-10 DIAGNOSIS — S32049D Unspecified fracture of fourth lumbar vertebra, subsequent encounter for fracture with routine healing: Secondary | ICD-10-CM | POA: Diagnosis not present

## 2021-07-11 DIAGNOSIS — I25119 Atherosclerotic heart disease of native coronary artery with unspecified angina pectoris: Secondary | ICD-10-CM | POA: Diagnosis not present

## 2021-07-11 DIAGNOSIS — I255 Ischemic cardiomyopathy: Secondary | ICD-10-CM | POA: Diagnosis not present

## 2021-07-11 DIAGNOSIS — F0283 Dementia in other diseases classified elsewhere, unspecified severity, with mood disturbance: Secondary | ICD-10-CM | POA: Diagnosis not present

## 2021-07-11 DIAGNOSIS — I130 Hypertensive heart and chronic kidney disease with heart failure and stage 1 through stage 4 chronic kidney disease, or unspecified chronic kidney disease: Secondary | ICD-10-CM | POA: Diagnosis not present

## 2021-07-11 DIAGNOSIS — S32049D Unspecified fracture of fourth lumbar vertebra, subsequent encounter for fracture with routine healing: Secondary | ICD-10-CM | POA: Diagnosis not present

## 2021-07-11 DIAGNOSIS — G311 Senile degeneration of brain, not elsewhere classified: Secondary | ICD-10-CM | POA: Diagnosis not present

## 2021-07-15 DIAGNOSIS — I255 Ischemic cardiomyopathy: Secondary | ICD-10-CM | POA: Diagnosis not present

## 2021-07-15 DIAGNOSIS — I25119 Atherosclerotic heart disease of native coronary artery with unspecified angina pectoris: Secondary | ICD-10-CM | POA: Diagnosis not present

## 2021-07-15 DIAGNOSIS — G311 Senile degeneration of brain, not elsewhere classified: Secondary | ICD-10-CM | POA: Diagnosis not present

## 2021-07-15 DIAGNOSIS — F0283 Dementia in other diseases classified elsewhere, unspecified severity, with mood disturbance: Secondary | ICD-10-CM | POA: Diagnosis not present

## 2021-07-15 DIAGNOSIS — S32049D Unspecified fracture of fourth lumbar vertebra, subsequent encounter for fracture with routine healing: Secondary | ICD-10-CM | POA: Diagnosis not present

## 2021-07-15 DIAGNOSIS — I130 Hypertensive heart and chronic kidney disease with heart failure and stage 1 through stage 4 chronic kidney disease, or unspecified chronic kidney disease: Secondary | ICD-10-CM | POA: Diagnosis not present

## 2021-07-17 DIAGNOSIS — Z20828 Contact with and (suspected) exposure to other viral communicable diseases: Secondary | ICD-10-CM | POA: Diagnosis not present

## 2021-07-17 DIAGNOSIS — Z1159 Encounter for screening for other viral diseases: Secondary | ICD-10-CM | POA: Diagnosis not present

## 2021-07-18 DIAGNOSIS — I130 Hypertensive heart and chronic kidney disease with heart failure and stage 1 through stage 4 chronic kidney disease, or unspecified chronic kidney disease: Secondary | ICD-10-CM | POA: Diagnosis not present

## 2021-07-18 DIAGNOSIS — F0283 Dementia in other diseases classified elsewhere, unspecified severity, with mood disturbance: Secondary | ICD-10-CM | POA: Diagnosis not present

## 2021-07-18 DIAGNOSIS — G311 Senile degeneration of brain, not elsewhere classified: Secondary | ICD-10-CM | POA: Diagnosis not present

## 2021-07-18 DIAGNOSIS — I255 Ischemic cardiomyopathy: Secondary | ICD-10-CM | POA: Diagnosis not present

## 2021-07-18 DIAGNOSIS — I25119 Atherosclerotic heart disease of native coronary artery with unspecified angina pectoris: Secondary | ICD-10-CM | POA: Diagnosis not present

## 2021-07-18 DIAGNOSIS — S32049D Unspecified fracture of fourth lumbar vertebra, subsequent encounter for fracture with routine healing: Secondary | ICD-10-CM | POA: Diagnosis not present

## 2021-07-19 DIAGNOSIS — Z1159 Encounter for screening for other viral diseases: Secondary | ICD-10-CM | POA: Diagnosis not present

## 2021-07-19 DIAGNOSIS — I130 Hypertensive heart and chronic kidney disease with heart failure and stage 1 through stage 4 chronic kidney disease, or unspecified chronic kidney disease: Secondary | ICD-10-CM | POA: Diagnosis not present

## 2021-07-19 DIAGNOSIS — I25119 Atherosclerotic heart disease of native coronary artery with unspecified angina pectoris: Secondary | ICD-10-CM | POA: Diagnosis not present

## 2021-07-19 DIAGNOSIS — F0283 Dementia in other diseases classified elsewhere, unspecified severity, with mood disturbance: Secondary | ICD-10-CM | POA: Diagnosis not present

## 2021-07-19 DIAGNOSIS — I255 Ischemic cardiomyopathy: Secondary | ICD-10-CM | POA: Diagnosis not present

## 2021-07-19 DIAGNOSIS — Z20828 Contact with and (suspected) exposure to other viral communicable diseases: Secondary | ICD-10-CM | POA: Diagnosis not present

## 2021-07-19 DIAGNOSIS — S32049D Unspecified fracture of fourth lumbar vertebra, subsequent encounter for fracture with routine healing: Secondary | ICD-10-CM | POA: Diagnosis not present

## 2021-07-19 DIAGNOSIS — G311 Senile degeneration of brain, not elsewhere classified: Secondary | ICD-10-CM | POA: Diagnosis not present

## 2021-07-20 DIAGNOSIS — S32049D Unspecified fracture of fourth lumbar vertebra, subsequent encounter for fracture with routine healing: Secondary | ICD-10-CM | POA: Diagnosis not present

## 2021-07-20 DIAGNOSIS — I255 Ischemic cardiomyopathy: Secondary | ICD-10-CM | POA: Diagnosis not present

## 2021-07-20 DIAGNOSIS — G311 Senile degeneration of brain, not elsewhere classified: Secondary | ICD-10-CM | POA: Diagnosis not present

## 2021-07-20 DIAGNOSIS — F0283 Dementia in other diseases classified elsewhere, unspecified severity, with mood disturbance: Secondary | ICD-10-CM | POA: Diagnosis not present

## 2021-07-20 DIAGNOSIS — I25119 Atherosclerotic heart disease of native coronary artery with unspecified angina pectoris: Secondary | ICD-10-CM | POA: Diagnosis not present

## 2021-07-20 DIAGNOSIS — I130 Hypertensive heart and chronic kidney disease with heart failure and stage 1 through stage 4 chronic kidney disease, or unspecified chronic kidney disease: Secondary | ICD-10-CM | POA: Diagnosis not present

## 2021-07-22 DIAGNOSIS — Z20828 Contact with and (suspected) exposure to other viral communicable diseases: Secondary | ICD-10-CM | POA: Diagnosis not present

## 2021-07-22 DIAGNOSIS — I255 Ischemic cardiomyopathy: Secondary | ICD-10-CM | POA: Diagnosis not present

## 2021-07-22 DIAGNOSIS — I25119 Atherosclerotic heart disease of native coronary artery with unspecified angina pectoris: Secondary | ICD-10-CM | POA: Diagnosis not present

## 2021-07-22 DIAGNOSIS — S32049D Unspecified fracture of fourth lumbar vertebra, subsequent encounter for fracture with routine healing: Secondary | ICD-10-CM | POA: Diagnosis not present

## 2021-07-22 DIAGNOSIS — Z1159 Encounter for screening for other viral diseases: Secondary | ICD-10-CM | POA: Diagnosis not present

## 2021-07-22 DIAGNOSIS — I130 Hypertensive heart and chronic kidney disease with heart failure and stage 1 through stage 4 chronic kidney disease, or unspecified chronic kidney disease: Secondary | ICD-10-CM | POA: Diagnosis not present

## 2021-07-22 DIAGNOSIS — G311 Senile degeneration of brain, not elsewhere classified: Secondary | ICD-10-CM | POA: Diagnosis not present

## 2021-07-22 DIAGNOSIS — F0283 Dementia in other diseases classified elsewhere, unspecified severity, with mood disturbance: Secondary | ICD-10-CM | POA: Diagnosis not present

## 2021-07-23 DIAGNOSIS — I255 Ischemic cardiomyopathy: Secondary | ICD-10-CM | POA: Diagnosis not present

## 2021-07-23 DIAGNOSIS — I25119 Atherosclerotic heart disease of native coronary artery with unspecified angina pectoris: Secondary | ICD-10-CM | POA: Diagnosis not present

## 2021-07-23 DIAGNOSIS — S32049D Unspecified fracture of fourth lumbar vertebra, subsequent encounter for fracture with routine healing: Secondary | ICD-10-CM | POA: Diagnosis not present

## 2021-07-23 DIAGNOSIS — G311 Senile degeneration of brain, not elsewhere classified: Secondary | ICD-10-CM | POA: Diagnosis not present

## 2021-07-23 DIAGNOSIS — I130 Hypertensive heart and chronic kidney disease with heart failure and stage 1 through stage 4 chronic kidney disease, or unspecified chronic kidney disease: Secondary | ICD-10-CM | POA: Diagnosis not present

## 2021-07-23 DIAGNOSIS — F0283 Dementia in other diseases classified elsewhere, unspecified severity, with mood disturbance: Secondary | ICD-10-CM | POA: Diagnosis not present

## 2021-07-24 DIAGNOSIS — F0283 Dementia in other diseases classified elsewhere, unspecified severity, with mood disturbance: Secondary | ICD-10-CM | POA: Diagnosis not present

## 2021-07-24 DIAGNOSIS — I255 Ischemic cardiomyopathy: Secondary | ICD-10-CM | POA: Diagnosis not present

## 2021-07-24 DIAGNOSIS — I25119 Atherosclerotic heart disease of native coronary artery with unspecified angina pectoris: Secondary | ICD-10-CM | POA: Diagnosis not present

## 2021-07-24 DIAGNOSIS — Z20828 Contact with and (suspected) exposure to other viral communicable diseases: Secondary | ICD-10-CM | POA: Diagnosis not present

## 2021-07-24 DIAGNOSIS — Z1159 Encounter for screening for other viral diseases: Secondary | ICD-10-CM | POA: Diagnosis not present

## 2021-07-24 DIAGNOSIS — I130 Hypertensive heart and chronic kidney disease with heart failure and stage 1 through stage 4 chronic kidney disease, or unspecified chronic kidney disease: Secondary | ICD-10-CM | POA: Diagnosis not present

## 2021-07-24 DIAGNOSIS — S32049D Unspecified fracture of fourth lumbar vertebra, subsequent encounter for fracture with routine healing: Secondary | ICD-10-CM | POA: Diagnosis not present

## 2021-07-24 DIAGNOSIS — G311 Senile degeneration of brain, not elsewhere classified: Secondary | ICD-10-CM | POA: Diagnosis not present

## 2021-07-25 DIAGNOSIS — F0283 Dementia in other diseases classified elsewhere, unspecified severity, with mood disturbance: Secondary | ICD-10-CM | POA: Diagnosis not present

## 2021-07-25 DIAGNOSIS — S32049D Unspecified fracture of fourth lumbar vertebra, subsequent encounter for fracture with routine healing: Secondary | ICD-10-CM | POA: Diagnosis not present

## 2021-07-25 DIAGNOSIS — I25119 Atherosclerotic heart disease of native coronary artery with unspecified angina pectoris: Secondary | ICD-10-CM | POA: Diagnosis not present

## 2021-07-25 DIAGNOSIS — G311 Senile degeneration of brain, not elsewhere classified: Secondary | ICD-10-CM | POA: Diagnosis not present

## 2021-07-25 DIAGNOSIS — I130 Hypertensive heart and chronic kidney disease with heart failure and stage 1 through stage 4 chronic kidney disease, or unspecified chronic kidney disease: Secondary | ICD-10-CM | POA: Diagnosis not present

## 2021-07-25 DIAGNOSIS — I255 Ischemic cardiomyopathy: Secondary | ICD-10-CM | POA: Diagnosis not present

## 2021-07-30 ENCOUNTER — Encounter: Payer: Self-pay | Admitting: Internal Medicine

## 2021-08-04 DEATH — deceased

## 2022-02-25 IMAGING — DX DG HIP (WITH OR WITHOUT PELVIS) 2-3V*R*
3 series · 3 of 3 positions shown · non-contrast
Comparison: September 06, 2020.

CLINICAL DATA: Acute right hip pain after fall 4 days ago.

EXAM:
DG HIP (WITH OR WITHOUT PELVIS) 2-3V RIGHT

[dg hip unilat w or w/o pelvis 2-3 views  (1 of 3)]
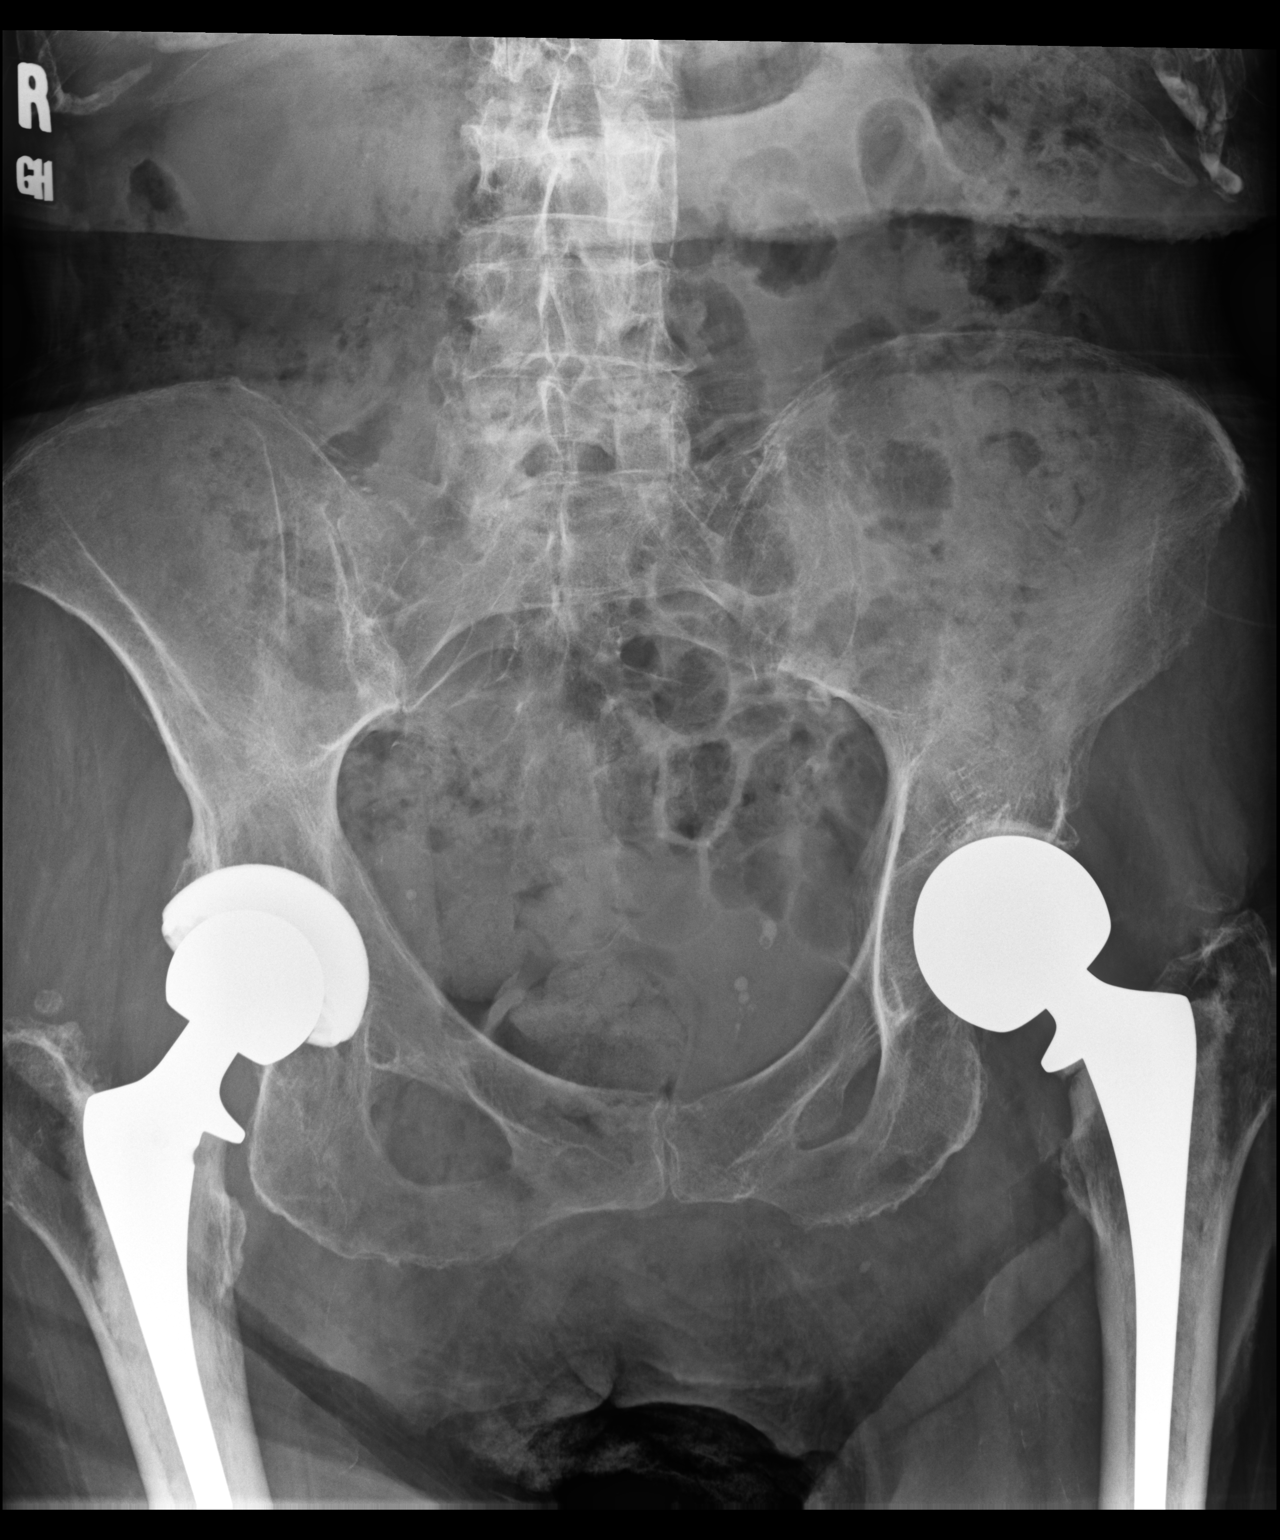

[dg hip unilat w or w/o pelvis 2-3 views  (2 of 3)]
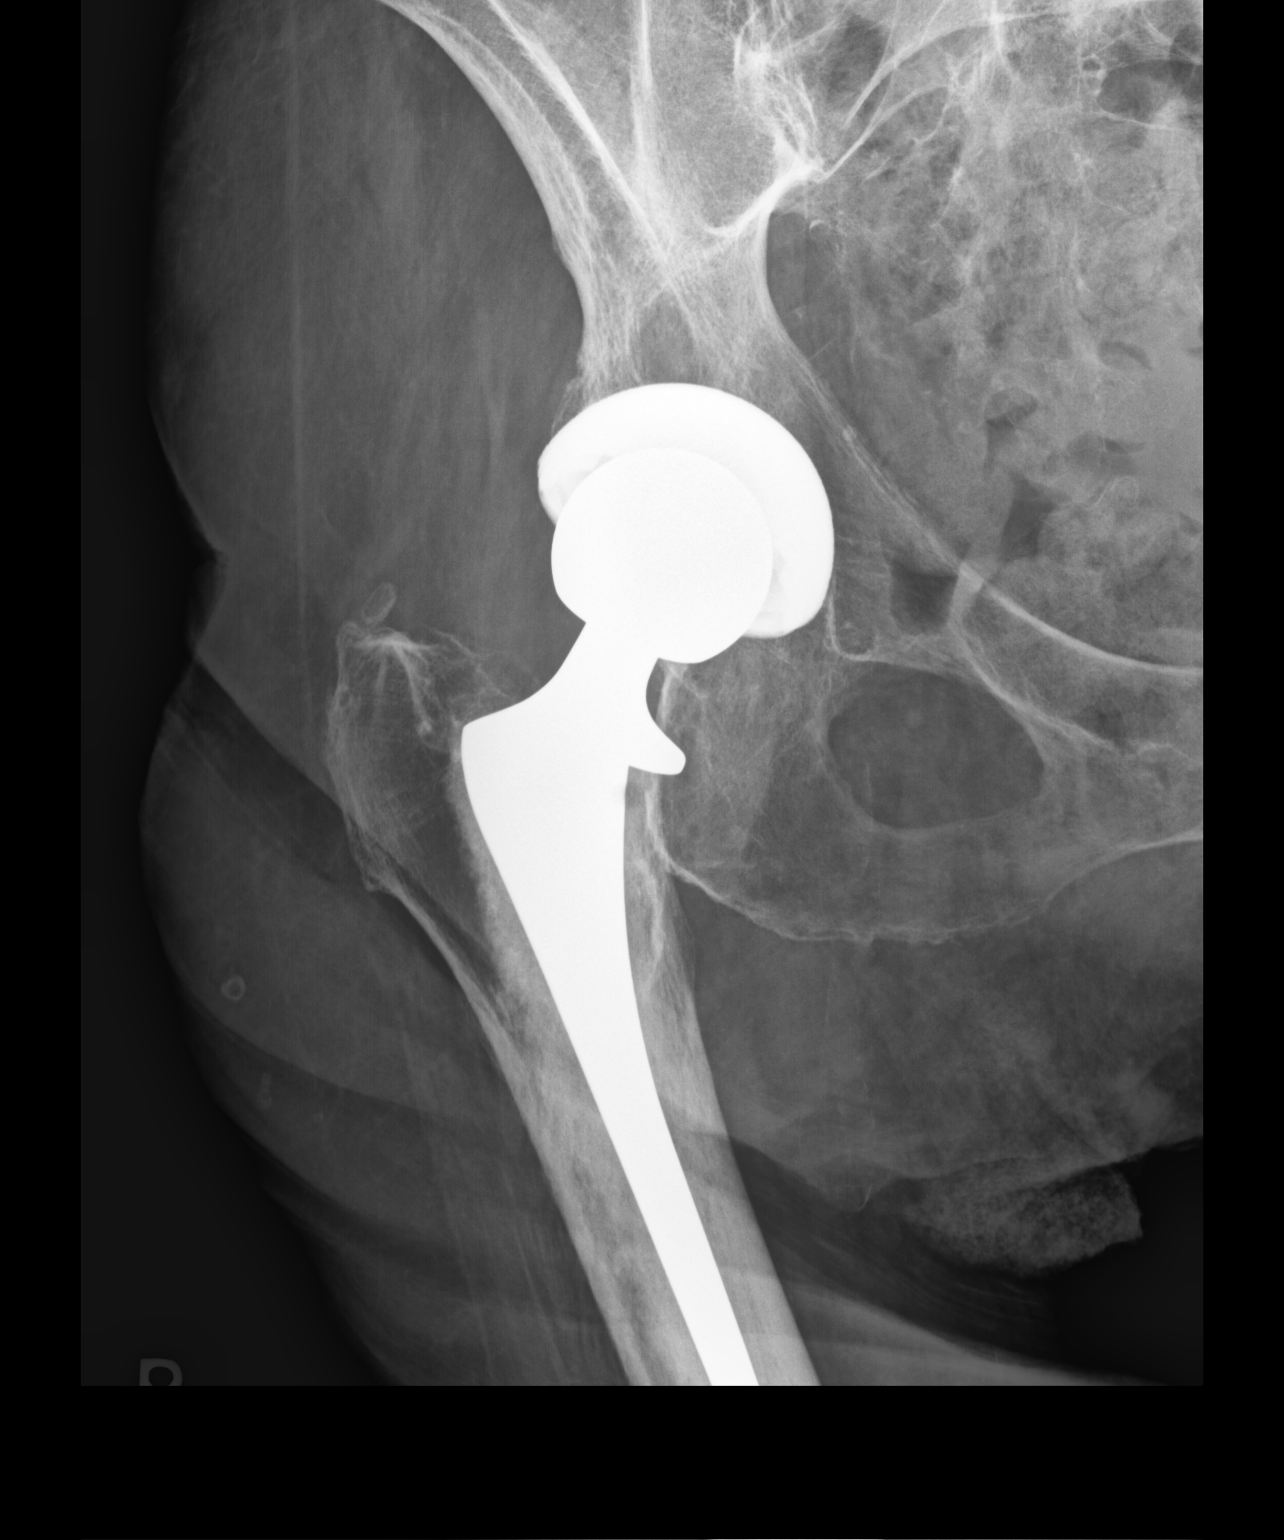

[dg hip unilat w or w/o pelvis 2-3 views  (3 of 3)]
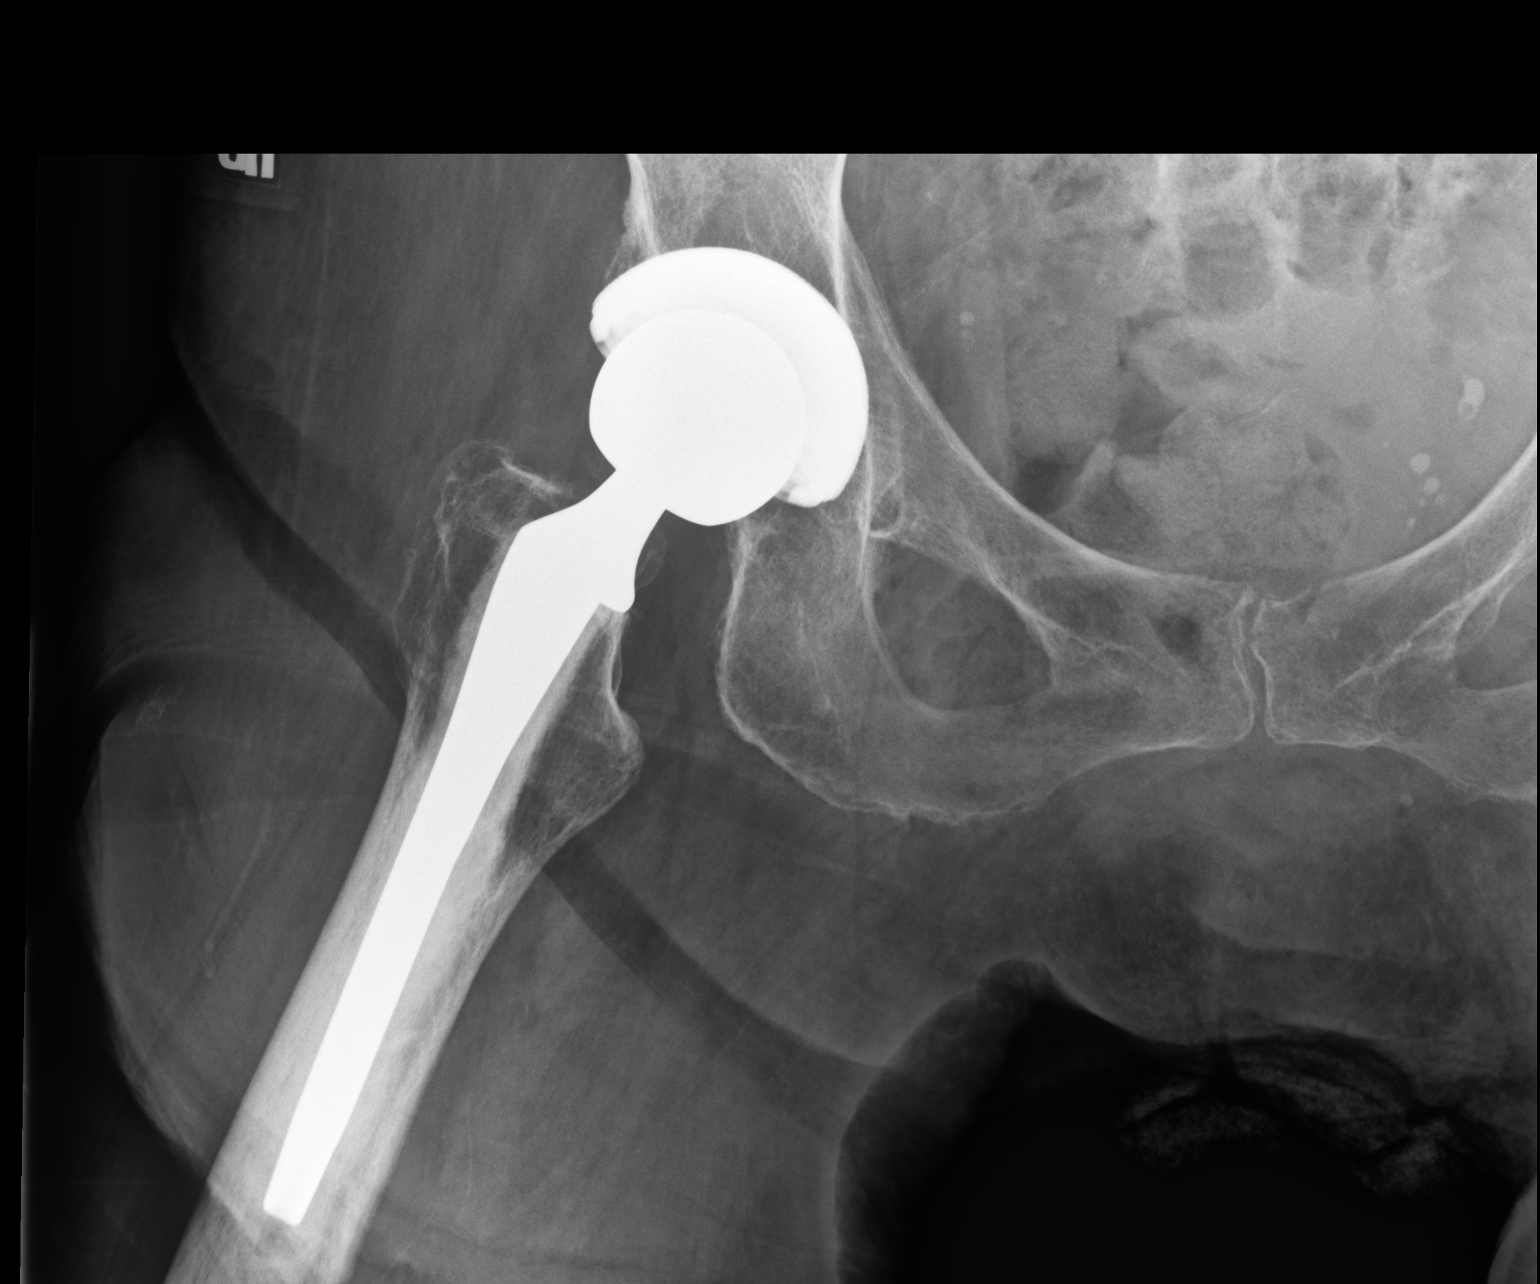

[3 of 3 positions shown; findings below may reference images not displayed]

FINDINGS: There is no evidence of hip fracture or dislocation. Status post
bilateral hip arthroplasties.
IMPRESSION: No acute abnormality seen.
